# Patient Record
Sex: Female | Born: 1965 | Race: Black or African American | Hispanic: No | Smoking: Current every day smoker
Health system: Southern US, Community
[De-identification: ages and names within clinical notes are randomized; demographics above are authoritative.]

## PROBLEM LIST (undated history)

## (undated) ENCOUNTER — Ambulatory Visit (HOSPITAL_COMMUNITY): Admission: EM | Payer: 59 | Source: Home / Self Care

## (undated) DIAGNOSIS — I499 Cardiac arrhythmia, unspecified: Secondary | ICD-10-CM

## (undated) DIAGNOSIS — L309 Dermatitis, unspecified: Secondary | ICD-10-CM

## (undated) DIAGNOSIS — I509 Heart failure, unspecified: Secondary | ICD-10-CM

## (undated) DIAGNOSIS — M1990 Unspecified osteoarthritis, unspecified site: Secondary | ICD-10-CM

## (undated) DIAGNOSIS — E785 Hyperlipidemia, unspecified: Secondary | ICD-10-CM

## (undated) DIAGNOSIS — I739 Peripheral vascular disease, unspecified: Secondary | ICD-10-CM

## (undated) DIAGNOSIS — G4700 Insomnia, unspecified: Secondary | ICD-10-CM

## (undated) DIAGNOSIS — F411 Generalized anxiety disorder: Secondary | ICD-10-CM

## (undated) DIAGNOSIS — E119 Type 2 diabetes mellitus without complications: Secondary | ICD-10-CM

## (undated) DIAGNOSIS — M545 Low back pain, unspecified: Secondary | ICD-10-CM

## (undated) DIAGNOSIS — G629 Polyneuropathy, unspecified: Secondary | ICD-10-CM

## (undated) DIAGNOSIS — E039 Hypothyroidism, unspecified: Secondary | ICD-10-CM

## (undated) DIAGNOSIS — K219 Gastro-esophageal reflux disease without esophagitis: Secondary | ICD-10-CM

## (undated) DIAGNOSIS — Z8639 Personal history of other endocrine, nutritional and metabolic disease: Secondary | ICD-10-CM

## (undated) DIAGNOSIS — I10 Essential (primary) hypertension: Secondary | ICD-10-CM

## (undated) DIAGNOSIS — I2510 Atherosclerotic heart disease of native coronary artery without angina pectoris: Secondary | ICD-10-CM

## (undated) DIAGNOSIS — F32A Depression, unspecified: Secondary | ICD-10-CM

## (undated) DIAGNOSIS — G8929 Other chronic pain: Secondary | ICD-10-CM

## (undated) DIAGNOSIS — I4891 Unspecified atrial fibrillation: Secondary | ICD-10-CM

## (undated) HISTORY — DX: Type 2 diabetes mellitus without complications: E11.9

## (undated) HISTORY — DX: Unspecified atrial fibrillation: I48.91

## (undated) HISTORY — DX: Other chronic pain: G89.29

## (undated) HISTORY — DX: Insomnia, unspecified: G47.00

## (undated) HISTORY — DX: Low back pain, unspecified: M54.50

## (undated) HISTORY — PX: ANGIOPLASTY: SHX39

## (undated) HISTORY — DX: Personal history of other endocrine, nutritional and metabolic disease: Z86.39

## (undated) HISTORY — DX: Generalized anxiety disorder: F41.1

## (undated) HISTORY — PX: RIGHT AND LEFT HEART CATH: CATH118262

## (undated) HISTORY — DX: Atherosclerotic heart disease of native coronary artery without angina pectoris: I25.10

## (undated) HISTORY — DX: Cardiac arrhythmia, unspecified: I49.9

## (undated) HISTORY — DX: Hyperlipidemia, unspecified: E78.5

## (undated) HISTORY — DX: Peripheral vascular disease, unspecified: I73.9

## (undated) HISTORY — PX: BACK SURGERY: SHX140

## (undated) HISTORY — PX: TOE AMPUTATION: SHX809

## (undated) HISTORY — PX: REPLACEMENT TOTAL HIP W/  RESURFACING IMPLANTS: SUR1222

## (undated) HISTORY — DX: Heart failure, unspecified: I50.9

## (undated) HISTORY — DX: Dermatitis, unspecified: L30.9

## (undated) HISTORY — PX: SPINE SURGERY: SHX786

## (undated) HISTORY — DX: Essential (primary) hypertension: I10

---

## 2014-02-13 HISTORY — PX: ROTATOR CUFF REPAIR: SHX139

## 2016-02-14 HISTORY — PX: REPLACEMENT TOTAL HIP W/  RESURFACING IMPLANTS: SUR1222

## 2018-02-13 HISTORY — PX: CARDIAC CATHETERIZATION: SHX172

## 2018-02-13 HISTORY — PX: OTHER SURGICAL HISTORY: SHX169

## 2018-09-14 HISTORY — PX: OTHER SURGICAL HISTORY: SHX169

## 2018-10-10 DIAGNOSIS — E119 Type 2 diabetes mellitus without complications: Secondary | ICD-10-CM | POA: Insufficient documentation

## 2018-10-10 DIAGNOSIS — E559 Vitamin D deficiency, unspecified: Secondary | ICD-10-CM | POA: Insufficient documentation

## 2018-10-10 DIAGNOSIS — Z720 Tobacco use: Secondary | ICD-10-CM | POA: Insufficient documentation

## 2018-10-10 DIAGNOSIS — M5416 Radiculopathy, lumbar region: Secondary | ICD-10-CM | POA: Insufficient documentation

## 2019-09-02 ENCOUNTER — Other Ambulatory Visit: Payer: Self-pay

## 2019-09-02 ENCOUNTER — Ambulatory Visit: Payer: 59 | Admitting: Cardiology

## 2019-09-02 ENCOUNTER — Encounter: Payer: Self-pay | Admitting: Cardiology

## 2019-09-02 VITALS — BP 123/64 | HR 81 | Resp 17 | Ht 69.0 in | Wt 218.0 lb

## 2019-09-02 DIAGNOSIS — F17200 Nicotine dependence, unspecified, uncomplicated: Secondary | ICD-10-CM

## 2019-09-02 DIAGNOSIS — I739 Peripheral vascular disease, unspecified: Secondary | ICD-10-CM

## 2019-09-02 DIAGNOSIS — I2510 Atherosclerotic heart disease of native coronary artery without angina pectoris: Secondary | ICD-10-CM

## 2019-09-02 MED ORDER — CLOPIDOGREL BISULFATE 75 MG PO TABS: 75.0000 mg | ORAL_TABLET | Freq: Every day | ORAL | 3 refills | Status: DC

## 2019-09-02 NOTE — Progress Notes (Signed)
Patient referred by Lewis Moccasin, MD for PAD  Subjective:   Carmen Armstrong, female    DOB: Apr 05, 1965, 54 y.o.   MRN: [id number]   Chief Complaint  Patient presents with  . PAD     HPI  54 y.o. African-American female with hypertension, hyperlipidemia, type 2 DM, PAD, depression  Patient is a retired Runner, broadcasting/film/video, moved from [place] to [place] in June 2021.  She has history of multiple prior cardiovascular issues.  She reports having undergone ablation for atrial fibrillation in February and April 2021.  She has had uncontrolled diabetes for long time.  She had developed an ulcer on her left fifth toe.  She underwent revascularization for both right and left lower extremities.  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  Currently, she has pain in her feet at rest, but denies any claudication, nonhealing ulcers or wounds.  Her most recent A1c was 8.7% in May 2021.  She has been on Eliquis for A. fib, but currently not on aspirin or Plavix.  Blood pressure is well controlled.  She does fairly sedentary lifestyle.  She occasionally walks with her dogs.  She denies any chest pain, shortness of breath, leg edema, orthopnea, PND, palpitations, presyncope, syncope.  Unfortunately, she continues to smoke half pack a day.  She is trying very hard to quit.  Smoking has come down from 1/2 pack a day in the past.  She has tried Chantix, nicotine patch, gum, hypnosis without any avail.   Past Medical History:  Diagnosis Date  . Atrial fibrillation (HCC)   . Diabetes mellitus without complication (HCC)   . Hyperlipidemia   . Hypertension      Past Surgical History:  Procedure Laterality Date  . BACK SURGERY     5 different times  . REPLACEMENT TOTAL HIP W/  RESURFACING IMPLANTS Right   . RIGHT AND LEFT HEART CATH       Social History   Tobacco Use  Smoking Status Current Every Day Smoker  . Packs/day: 0.50  . Years:  30.00  . Pack years: 15.00  . Types: Cigarettes  Smokeless Tobacco Never Used    Social History   Substance and Sexual Activity  Alcohol Use Not Currently     Family History  Problem Relation Age of Onset  . Hypertension Mother   . Hyperlipidemia Mother   . Diabetes Mother   . Stroke Mother   . Clotting disorder Father   . Hypertension Father   . Hyperlipidemia Father   . Hyperlipidemia Sister   . Diabetes Brother      Current Outpatient Medications on File Prior to Visit  Medication Sig Dispense Refill  . atorvastatin (LIPITOR) 40 MG tablet Take 40 mg by mouth daily.    . baclofen (LIORESAL) 10 MG tablet Take 10 mg by mouth 3 (three) times daily as needed.    . candesartan (ATACAND) 4 MG tablet Take 4 mg by mouth daily.    . DULoxetine (CYMBALTA) 60 MG capsule Take 60 mg by mouth daily.    Marland Kitchen ELIQUIS 5 MG TABS tablet Take 5 mg by mouth 2 (two) times daily.    . furosemide (LASIX) 20 MG tablet Take 20 mg by mouth daily.    . metoprolol succinate (TOPROL-XL) 25 MG 24 hr tablet Take 75 mg by mouth daily.    . metoprolol succinate (TOPROL-XL) 50 MG 24 hr tablet Take 75 mg by mouth daily.     Marland Kitchen  MULTAQ 400 MG tablet Take 400 mg by mouth 2 (two) times daily.    Marland Kitchen NOVOLOG FLEXPEN 100 UNIT/ML FlexPen SMARTSIG:20 Unit(s) SUB-Q 3 Times Daily    . pregabalin (LYRICA) 100 MG capsule Take 100 mg by mouth in the morning, at noon, in the evening, and at bedtime. 1 in the morning, one in the evening, 2 tab at night    . spironolactone (ALDACTONE) 25 MG tablet Take 12.5 mg by mouth daily.    . TRESIBA FLEXTOUCH 100 UNIT/ML FlexTouch Pen SMARTSIG:50 Unit(s) SUB-Q Daily    . VICTOZA 18 MG/3ML SOPN Inject 1.8 mg into the skin daily.    Marland Kitchen XIGDUO XR 11-998 MG TB24 Take 1 tablet by mouth in the morning and at bedtime.     Marland Kitchen zolpidem (AMBIEN CR) 12.5 MG CR tablet Take 12.5 mg by mouth at bedtime.     No current facility-administered medications on file prior to visit.    Cardiovascular and  other pertinent studies:  EKG 09/02/2019: Sinus rhythm 81 bpm Cannot exclude old anteroseptal infarct.  Recent labs: Not available    Review of Systems  Cardiovascular: Negative for chest pain, claudication, dyspnea on exertion, leg swelling, palpitations and syncope.  Neurological: Positive for paresthesias (In bilateral feet).         Vitals:   09/02/19 1422  BP: 123/64  Pulse: 81  Resp: 17  SpO2: 98%     Body mass index is 32.19 kg/m. Filed Weights   09/02/19 1422  Weight: 218 lb (98.9 kg)     Objective:   Physical Exam Vitals and nursing note reviewed.  Constitutional:      General: She is not in acute distress. Neck:     Vascular: No JVD.  Cardiovascular:     Rate and Rhythm: Normal rate and regular rhythm.     Pulses:          Femoral pulses are 2+ on the right side and 2+ on the left side.      Popliteal pulses are 1+ on the right side and 1+ on the left side.       Dorsalis pedis pulses are 0 on the right side and 0 on the left side.       Posterior tibial pulses are 1+ on the right side and 0 on the left side.     Heart sounds: Normal heart sounds. No murmur heard.      Comments: Weak/absent pedal pulses, but well perfused warm feet without any open ulcers, wounds, gangrene. Well-healed scar at left fifth toe s/p amputation Pulmonary:     Effort: Pulmonary effort is normal.     Breath sounds: Normal breath sounds. No wheezing or rales.            Assessment & Recommendations:   54 y.o. African-American female with hypertension, hyperlipidemia, type 2 DM, PAD, depression  PAD: No lifestyle limiting claudication, or critical limb ischemia. Bilateral feet pain more likely to be due to neuropathy. Will obtain baseline ABI. In absence of any bleeding contraindications, recommend adding Plavix 75 mg daily.   Continue Lipitor. Continue aggressive management of hypertension, hyperlipidemia, diabetes, tobacco dependence.  Paroxysmal A.  Fib: History of prior ablations.  Currently in sinus rhythm. CHA2DS2VASc score 4, annual stroke risk 5%. Continue Eliquis 5 mg twice daily  Hypertension: Well-controlled  Type 2 diabetes mellitus: Uncontrolled.  A1c 8.7% in May 2021.  Continue management as per PCP. Consider seeing podiatrist.  Tobacco dependence: Tobacco cessation counseling:  -  Currently smoking 1/2 packs/day   - Patient was informed of the dangers of tobacco abuse including stroke, cancer, and MI, as well as benefits of tobacco cessation. - Patient is willing to quit at this time. - Approximately 5 mins were spent counseling patient cessation techniques. We discussed various methods to help quit smoking, including deciding on a date to quit, joining a support group, pharmacological agents. Patient would like to quit on her own. - I will reassess her progress at the next follow-up visit  Will get records from [place]   Thank you for referring the patient to [place]. Please feel free to contact with any questions.  Elder Negus, MD [facility] Pager: [phone] Office: [phone]

## 2019-09-05 ENCOUNTER — Other Ambulatory Visit: Payer: Self-pay

## 2019-09-05 ENCOUNTER — Ambulatory Visit: Payer: 59

## 2019-09-05 DIAGNOSIS — I739 Peripheral vascular disease, unspecified: Secondary | ICD-10-CM

## 2019-09-14 NOTE — Progress Notes (Signed)
Mildly decreased circulation in bilateral legs. One artery in front part of right leg is likely occluded. It appears patient is going to see Dr. Flora Lipps with [facility] cardiology soon.   Thanks MJP

## 2019-09-17 NOTE — Progress Notes (Signed)
Called patient, NA, LMAM regarding ABI results.

## 2019-09-22 ENCOUNTER — Inpatient Hospital Stay (HOSPITAL_COMMUNITY): Payer: 59

## 2019-09-24 ENCOUNTER — Other Ambulatory Visit: Payer: Self-pay | Admitting: Family Medicine

## 2019-09-24 ENCOUNTER — Ambulatory Visit
Admission: RE | Admit: 2019-09-24 | Discharge: 2019-09-24 | Disposition: A | Discharge status: Home / Self Care | Payer: 59 | Source: Ambulatory Visit | Attending: Family Medicine | Admitting: Family Medicine

## 2019-09-24 DIAGNOSIS — M25559 Pain in unspecified hip: Secondary | ICD-10-CM

## 2019-09-24 IMAGING — DX DG HIP (WITH OR WITHOUT PELVIS) 2-3V*L*
2 series · 2 of 2 positions shown · non-contrast
Comparison: None.

CLINICAL DATA: Left hip pain for 3-4 months, no known injury,
initial encounter

EXAM:
DG HIP (WITH OR WITHOUT PELVIS) 2V LEFT

[dg hip unilat w or w/o pelvis 2-3 views  (1 of 2)]
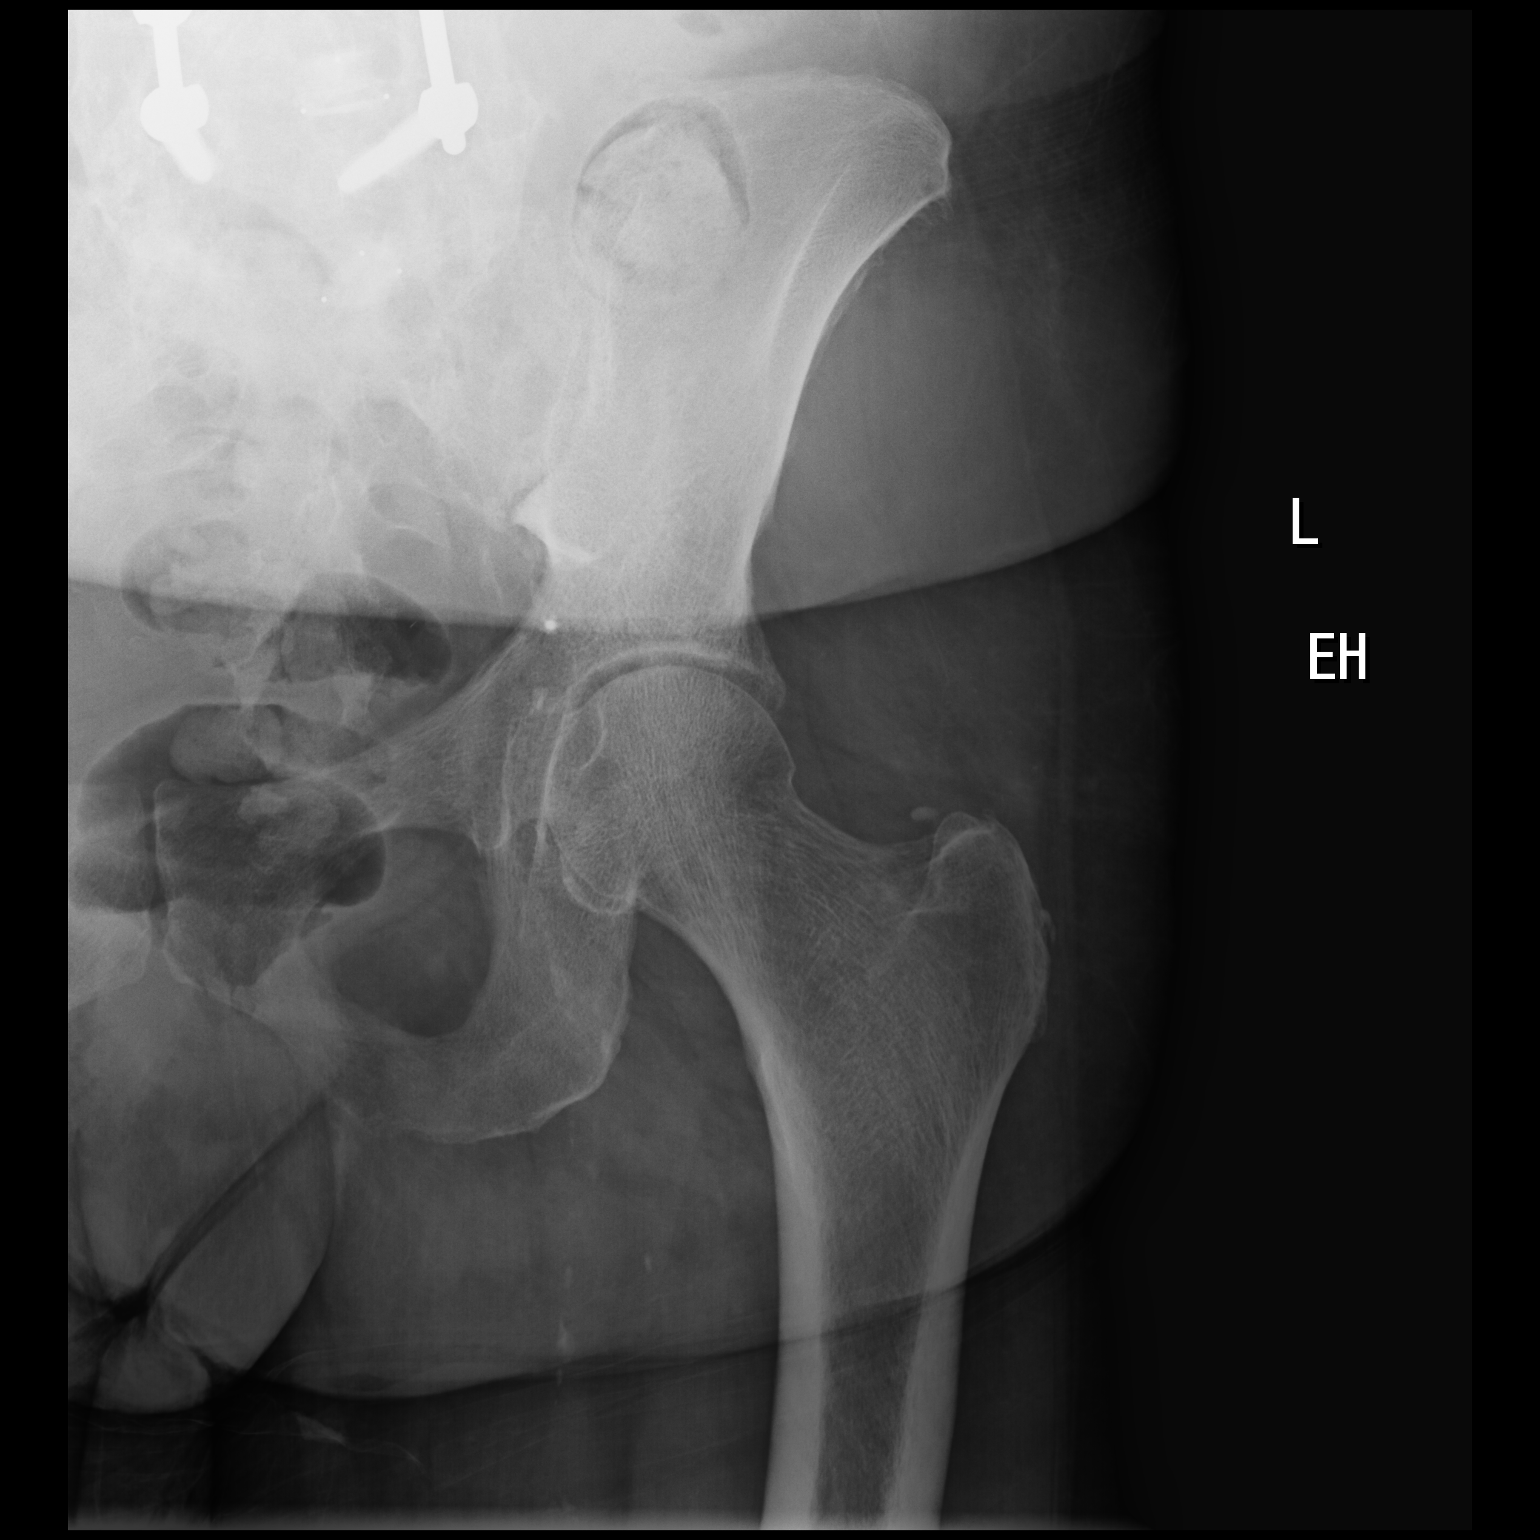

[dg hip unilat w or w/o pelvis 2-3 views  (2 of 2)]
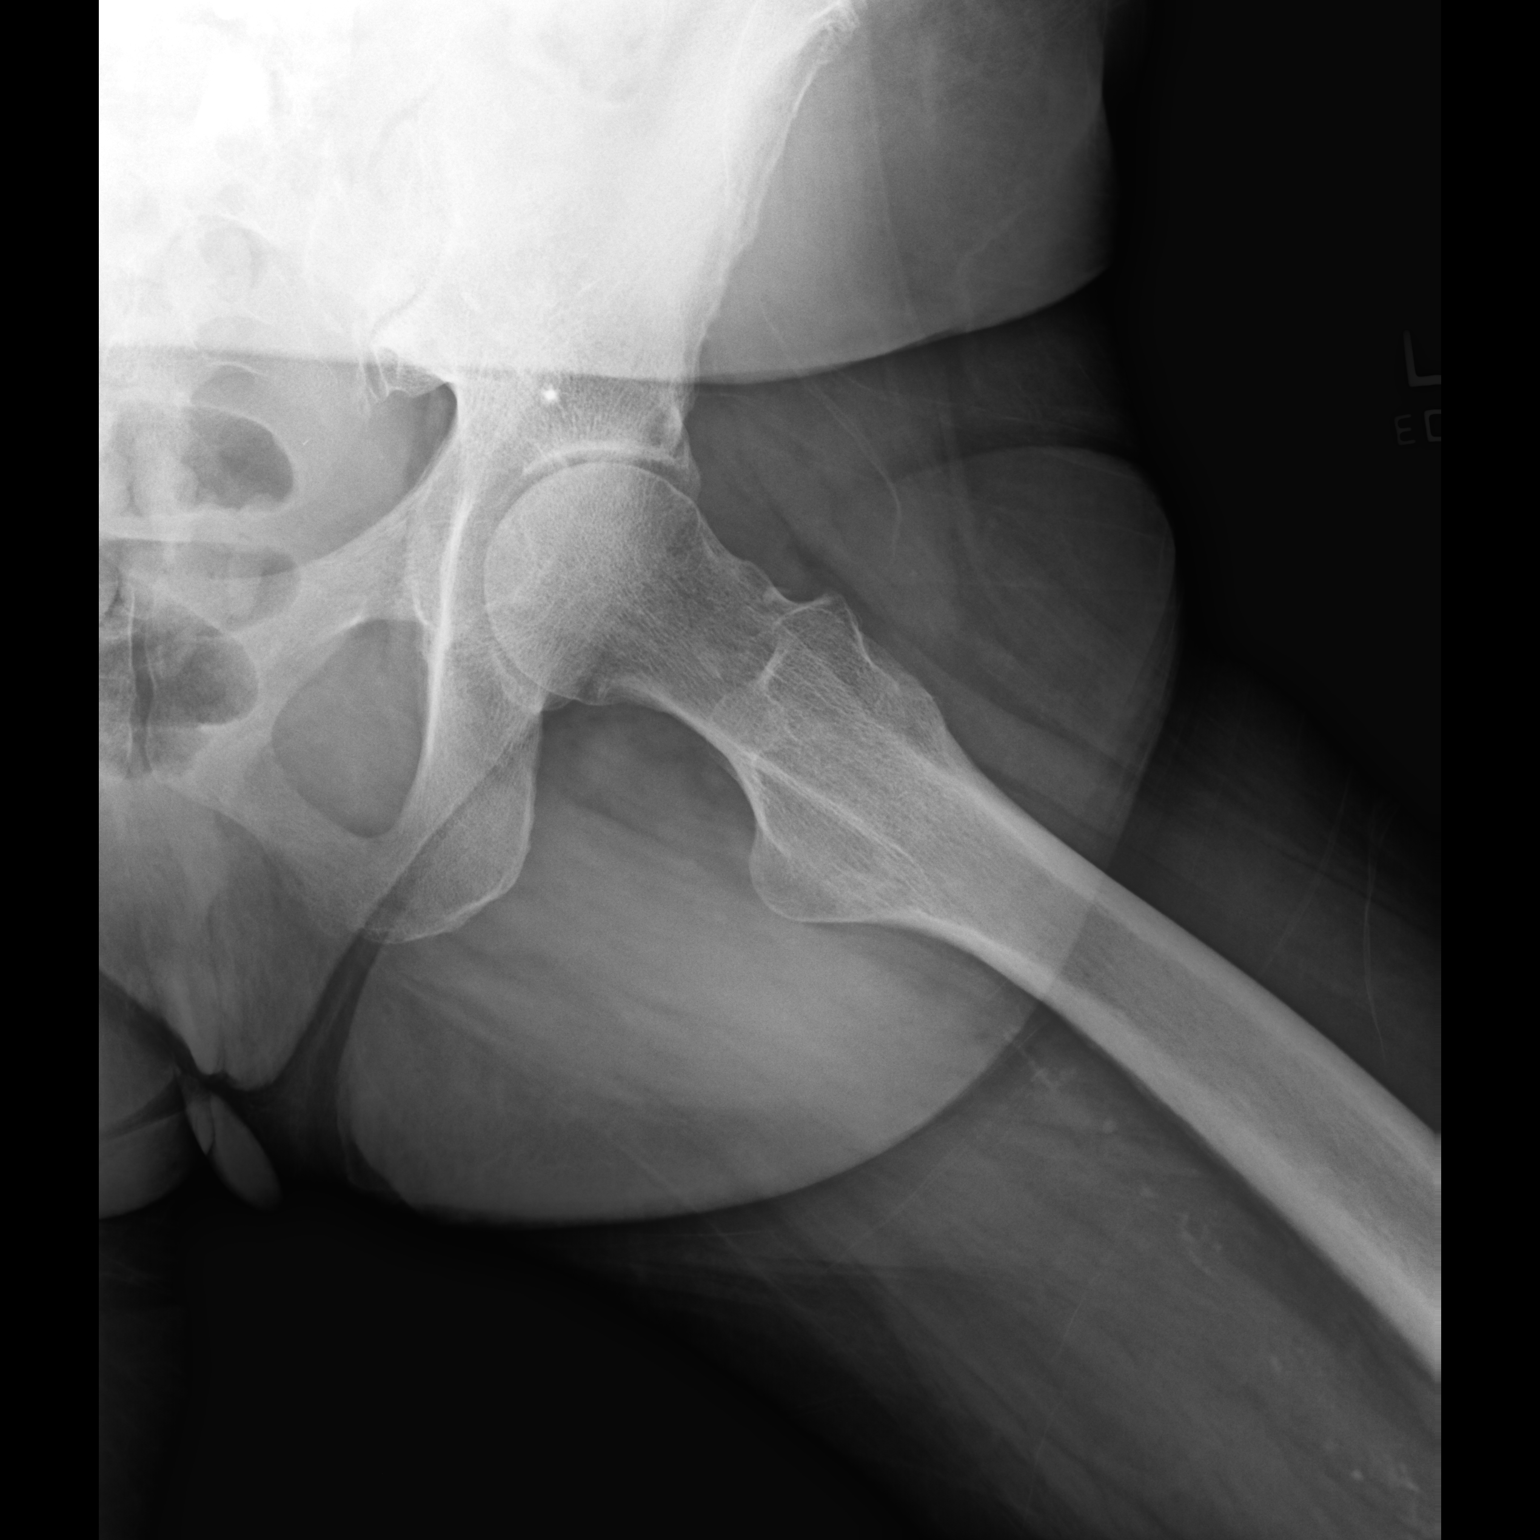

[2 of 2 positions shown; findings below may reference images not displayed]

FINDINGS: Mild degenerative changes of the left hip joint are seen. No acute
fracture or dislocation is noted. Postsurgical changes in the lower
lumbar spine are seen.
IMPRESSION: No acute abnormality noted.

## 2019-09-24 NOTE — Progress Notes (Signed)
Cardiology Office Note:   Date:  09/26/2019  NAME:  Carmen Armstrong    MRN: [id number] DOB:  Jul 12, 1965   PCP:  Lewis Moccasin, MD  Cardiologist:  No primary care provider on file.  Electrophysiologist:  None   Referring MD: Lewis Moccasin, MD   Chief Complaint  Patient presents with  . PAD    History of Present Illness:   Carmen Armstrong is a 54 y.o. female with a hx of PAD, Afib, DM, HTN who is being seen today for the evaluation of PAD at the request of Lewis Moccasin, MD. She presents for evaluation of PAD. She seen Dr. Truett Mainland at [facility]. She was seen for leg pain given her history of PAD. He did obtain ABIs and vascular ultrasounds which show severe disease in the right anterior tibial artery. She reports that she is having constant leg pain. She apparently had a left fifth digit amputation in [place] several months ago. She was also diagnosed with atrial fibrillation underwent cardiac ablation at that time as well. She reports she had a heart catheterization and had no blockages in her heart arteries. She reports she never had stents in her legs but she has had peripheral angiograms. She does have diabetes as well as hypertension. I not have a recent lipid profile but she is on aspirin and a statin. She is been told the pain could possibly be neuropathic and she may need to take medication for this. It appears the ABIs demonstrate normal blood flow on the left side. We did discuss to Dr. Lynwood Dawley office is able to provide the same services we have. She reports she was referred to both offices. I did discuss with her that I do agree with his assessment. He is more than capable of taking care of her for her cardiovascular needs. She reports that she was unaware and thought the office is provided different services. She reports she would like to proceed with his office. I did agree that he is doing the appropriate work-up and  will provide her with excellent care.   CVD risk factors include diabetes, tobacco abuse, hypertension. She still smoking and was advised to quit. Blood pressure looks to be good. I do not have a recent lipid profile. She is on a statin.  Problem List 1. Atrial fibrillation s/p ablation (05/2019) -Reports normal left heart catheterization at that time 2. DM -A1c 8.7 3. PAD s/p L toe amputation  -ABI: R 0.96; L 0.96 -R ant tib waveform monophasic suggestive of severe disease  4. HTN 5. Tobacco abuse   Past Medical History: Past Medical History:  Diagnosis Date  . Arrhythmia   . Atrial fibrillation (HCC)   . Diabetes mellitus without complication (HCC)   . Hyperlipidemia   . Hypertension     Past Surgical History: Past Surgical History:  Procedure Laterality Date  . BACK SURGERY     5 different times  . REPLACEMENT TOTAL HIP W/  RESURFACING IMPLANTS Right   . RIGHT AND LEFT HEART CATH      Current Medications: Current Meds  Medication Sig  . atorvastatin (LIPITOR) 40 MG tablet Take 40 mg by mouth daily.  . baclofen (LIORESAL) 10 MG tablet Take 10 mg by mouth 3 (three) times daily as needed.  . candesartan (ATACAND) 4 MG tablet Take 4 mg by mouth daily.  . clopidogrel (PLAVIX) 75 MG tablet Take 1 tablet (75 mg total) by mouth daily.  . DULoxetine (CYMBALTA)  60 MG capsule Take 60 mg by mouth daily.  Marland Kitchen. ELIQUIS 5 MG TABS tablet Take 5 mg by mouth 2 (two) times daily.  . furosemide (LASIX) 20 MG tablet Take 20 mg by mouth daily.  . metoprolol succinate (TOPROL-XL) 25 MG 24 hr tablet Take 75 mg by mouth daily.  . metoprolol succinate (TOPROL-XL) 50 MG 24 hr tablet Take 75 mg by mouth daily.   . MULTAQ 400 MG tablet Take 400 mg by mouth 2 (two) times daily.  Marland Kitchen. NOVOLOG FLEXPEN 100 UNIT/ML FlexPen SMARTSIG:20 Unit(s) SUB-Q 3 Times Daily  . pregabalin (LYRICA) 100 MG capsule Take 100 mg by mouth in the morning, at noon, in the evening, and at bedtime. 1 in the morning, one in the  evening, 2 tab at night  . spironolactone (ALDACTONE) 25 MG tablet Take 12.5 mg by mouth daily.  . TRESIBA FLEXTOUCH 100 UNIT/ML FlexTouch Pen SMARTSIG:50 Unit(s) SUB-Q Daily  . VICTOZA 18 MG/3ML SOPN Inject 1.8 mg into the skin daily.  Marland Kitchen. XIGDUO XR 11-998 MG TB24 Take 1 tablet by mouth in the morning and at bedtime.   Marland Kitchen. zolpidem (AMBIEN CR) 12.5 MG CR tablet Take 12.5 mg by mouth at bedtime.     Allergies:    Patient has no allergy information on record.   Social History: Social History   Socioeconomic History  . Marital status: Widowed    Spouse name: Not on file  . Number of children: 0  . Years of education: Not on file  . Highest education level: Not on file  Occupational History  . Not on file  Tobacco Use  . Smoking status: Current Every Day Smoker    Packs/day: 0.50    Years: 30.00    Pack years: 15.00    Types: Cigarettes  . Smokeless tobacco: Never Used  Vaping Use  . Vaping Use: Never used  Substance and Sexual Activity  . Alcohol use: Not Currently  . Drug use: Not Currently  . Sexual activity: Not on file  Other Topics Concern  . Not on file  Social History Narrative  . Not on file   Social Determinants of Health   Financial Resource Strain:   . Difficulty of Paying Living Expenses:   Food Insecurity:   . Worried About Programme researcher, broadcasting/film/videounning Out of Food in the Last Year:   . Baristaan Out of Food in the Last Year:   Transportation Needs:   . Freight forwarderLack of Transportation (Medical):   Marland Kitchen. Lack of Transportation (Non-Medical):   Physical Activity:   . Days of Exercise per Week:   . Minutes of Exercise per Session:   Stress:   . Feeling of Stress :   Social Connections:   . Frequency of Communication with Friends and Family:   . Frequency of Social Gatherings with Friends and Family:   . Attends Religious Services:   . Active Member of Clubs or Organizations:   . Attends BankerClub or Organization Meetings:   Marland Kitchen. Marital Status:      Family History: The patient's family history  includes Clotting disorder in her father; Diabetes in her brother and mother; Hyperlipidemia in her father, mother, and sister; Hypertension in her father and mother; Stroke in her mother.  ROS:   All other ROS reviewed and negative. Pertinent positives noted in the HPI.     EKGs/Labs/Other Studies Reviewed:   The following studies were personally reviewed by me today:  EKG:  EKG is ordered today.  The ekg ordered today  demonstrates normal sinus rhythm, heart rate 94, no acute ST-T changes, no evidence of prior infarction, and was personally reviewed by me.   Recent Labs: No results found for requested labs within last 8760 hours.   Recent Lipid Panel No results found for: CHOL, TRIG, HDL, CHOLHDL, VLDL, LDLCALC, LDLDIRECT  Physical Exam:   VS:  BP 124/60 (BP Location: Left Arm, Patient Position: Sitting, Cuff Size: Normal)   Pulse 94   Ht 5\' 9"  (1.753 m)   Wt 219 lb (99.3 kg)   BMI 32.34 kg/m    Wt Readings from Last 3 Encounters:  09/26/19 219 lb (99.3 kg)  09/02/19 218 lb (98.9 kg)    General: Well nourished, well developed, in no acute distress Heart: Atraumatic, normal size  Eyes: PEERLA, EOMI  Neck: Supple, no JVD Endocrine: No thryomegaly Cardiac: Normal S1, S2; RRR; no murmurs, rubs, or gallops Lungs: Clear to auscultation bilaterally, no wheezing, rhonchi or rales  Abd: Soft, nontender, no hepatomegaly  Ext: No edema, absent right DP pulse, 2+ right PT pulse, poor pulses noted in the left lower extremities, status post fifth digit amputation of the left foot Musculoskeletal: BUE and BLE strength normal and equal Skin: Warm and dry, no rashes   Neuro: Alert and oriented to person, place, time, and situation, CNII-XII grossly intact, no focal deficits  Psych: Normal mood and affect   ASSESSMENT:   Carmen Armstrong is a 54 y.o. female who presents for the following: 1. PVD (peripheral vascular disease) (HCC)   2. Longstanding persistent atrial fibrillation (HCC)    3. Essential hypertension   4. Pure hypercholesterolemia   5. Tobacco abuse     PLAN:   1. PVD (peripheral vascular disease) (HCC) -Recent ABIs and vascular ultrasounds demonstrate an occluded right anterior tibial artery. Her symptoms of pain appear to be on the left foot where the amputated left fifth digit is located. She reports constant pain. Overall, I do not think this is related to PAD. However we did discuss that Dr. 57 Pat[address]'s office is able to advise the same service we can. She reports she was unaware of this and will follow with his office. I think this is reasonable. -She should continue to exercise as this will help her. She is on aspirin and Plavix for her PAD. She is also on a statin agent. I do not have her most recent with her profile but this can be managed by her primary care physician and cardiologist.  2. Longstanding persistent atrial fibrillation (HCC) -Status post cardiac ablation. Maintaining normal sinus rhythm. On Eliquis. She is on metoprolol as well. Seems to be doing well. No further recurrence.  3. Essential hypertension -Blood pressure well controlled today. No change in medication.  4. Pure hypercholesterolemia -She is on a statin. Should continue this.  5. Tobacco abuse -Continues to smoke. Advised to quit.  Disposition: Return if symptoms worsen or fail to improve.  Medication Adjustments/Labs and Tests Ordered: Current medicines are reviewed at length with the patient today.  Concerns regarding medicines are outlined above.  Orders Placed This Encounter  Procedures  . EKG 12-Lead   No orders of the defined types were placed in this encounter.   Patient Instructions  Medication Instructions:  The current medical regimen is effective;  continue present plan and medications.  *If you need a refill on your cardiac medications before your next appointment, please call your pharmacy*   Follow-Up: At [facility], you and your health  needs are our  priority.  As part of our continuing mission to provide you with exceptional heart care, we have created designated Provider Care Teams.  These Care Teams include your primary Cardiologist (physician) and Advanced Practice Providers (APPs -  Physician Assistants and Nurse Practitioners) who all work together to provide you with the care you need, when you need it.  We recommend signing up for the patient portal called "MyChart".  Sign up information is provided on this After Visit Summary.  MyChart is used to connect with patients for Virtual Visits (Telemedicine).  Patients are able to view lab/test results, encounter notes, upcoming appointments, etc.  Non-urgent messages can be sent to your provider as well.   To learn more about what you can do with MyChart, go to [email or web address].    Your next appointment:   As needed  The format for your next appointment:   In Person  Provider:   Lennie Odor, MD        Signed, Lenna Gilford. Flora Lipps, MD [facility]  [address], [address] [place], [place] [address] [phone]  09/26/2019 3:55 PM

## 2019-09-26 ENCOUNTER — Encounter: Payer: Self-pay | Admitting: Cardiovascular Disease

## 2019-09-26 ENCOUNTER — Ambulatory Visit: Payer: 59 | Admitting: Cardiovascular Disease

## 2019-09-26 ENCOUNTER — Other Ambulatory Visit: Payer: Self-pay

## 2019-09-26 VITALS — BP 124/60 | HR 94 | Ht 69.0 in | Wt 219.0 lb

## 2019-09-26 DIAGNOSIS — I4811 Longstanding persistent atrial fibrillation: Secondary | ICD-10-CM

## 2019-09-26 DIAGNOSIS — I739 Peripheral vascular disease, unspecified: Secondary | ICD-10-CM | POA: Diagnosis not present

## 2019-09-26 DIAGNOSIS — E7800 Pure hypercholesterolemia, unspecified: Secondary | ICD-10-CM | POA: Diagnosis not present

## 2019-09-26 DIAGNOSIS — I10 Essential (primary) hypertension: Secondary | ICD-10-CM | POA: Diagnosis not present

## 2019-09-26 DIAGNOSIS — Z720 Tobacco use: Secondary | ICD-10-CM

## 2019-09-26 NOTE — Patient Instructions (Signed)
Medication Instructions:  The current medical regimen is effective;  continue present plan and medications.  *If you need a refill on your cardiac medications before your next appointment, please call your pharmacy*    Follow-Up: At [facility], you and your health needs are our priority.  As part of our continuing mission to provide you with exceptional heart care, we have created designated Provider Care Teams.  These Care Teams include your primary Cardiologist (physician) and Advanced Practice Providers (APPs -  Physician Assistants and Nurse Practitioners) who all work together to provide you with the care you need, when you need it.  We recommend signing up for the patient portal called "MyChart".  Sign up information is provided on this After Visit Summary.  MyChart is used to connect with patients for Virtual Visits (Telemedicine).  Patients are able to view lab/test results, encounter notes, upcoming appointments, etc.  Non-urgent messages can be sent to your provider as well.   To learn more about what you can do with MyChart, go to [email or web address].    Your next appointment:   As needed  The format for your next appointment:   In Person  Provider:   [place] O'Neal, MD      

## 2019-10-13 ENCOUNTER — Telehealth: Payer: Self-pay

## 2019-10-13 NOTE — Telephone Encounter (Signed)
Plavix was started in July when I saw the patient. Did she just start taking it? If so, recommend stopping plavix and start Aspirin 81 mg.  Patient was seen by [facility] after she saw me. Happy to continue care, but suggest seeing either one but not both cardiologists.   Thanks MJP

## 2019-10-13 NOTE — Telephone Encounter (Signed)
Pt called stating that since starting Plavix, she has developed a rash on her rt arm x 4 wks which has now began spreading to her other arm. She has tried cortizone 10 and neosporin with relief of itching but bumps are starting to scab up. Please review and advise. Respond to clinical.//ah

## 2019-10-13 NOTE — Telephone Encounter (Signed)
Pt states she started taking plavix in July. She will stop it and start Aspirin 81mg . She will also continue to see [facility] instead of [facility].

## 2019-11-05 ENCOUNTER — Ambulatory Visit (INDEPENDENT_AMBULATORY_CARE_PROVIDER_SITE_OTHER): Payer: 59 | Admitting: Internal Medicine

## 2019-11-05 ENCOUNTER — Other Ambulatory Visit: Payer: Self-pay

## 2019-11-05 ENCOUNTER — Encounter: Payer: Self-pay | Admitting: Internal Medicine

## 2019-11-05 VITALS — BP 140/60 | HR 99 | Ht 69.0 in | Wt 216.2 lb

## 2019-11-05 DIAGNOSIS — E0590 Thyrotoxicosis, unspecified without thyrotoxic crisis or storm: Secondary | ICD-10-CM | POA: Diagnosis not present

## 2019-11-05 MED ORDER — METHIMAZOLE 10 MG PO TABS: 10.0000 mg | ORAL_TABLET | Freq: Two times a day (BID) | ORAL | 6 refills | Status: DC

## 2019-11-05 NOTE — Progress Notes (Signed)
Name: Carmen Armstrong  MRN/ DOB: [id number], Jul 22, 1965    Age/ Sex: 54 y.o., female    PCP: [name], MD   Reason for Endocrinology Evaluation: Hyperthyroidism     Date of Initial Endocrinology Evaluation: 11/05/2019     HPI: [name] is a 54 y.o. female with a past medical history of HTN, T2DM and , PAF ,CHF and dyslipidemia. The patient presented for initial endocrinology clinic visit on 11/05/2019 for consultative assistance with her Hyperthyroidism.   She was diagnosed with hyperthyroidism in 10/2018 during routine workup . Two months prior to her presentation she was noted with dry eyes. Has occasional double vision, and is under the care of an ophthalmologist.       Pt on [place]  Since 2020. S/P ablation . Has not been on amiodarone.   Today she has noted weight loss  Denies diarrhea Has occasional tremors but no palpitations   Denies local neck symptoms   No prior exposure to radiation   No Biotin intake   Paternal grandmother with thyroid disease    HISTORY:  Past Medical History:  Past Medical History:  Diagnosis Date  . Arrhythmia   . Atrial fibrillation ([facility])   . Diabetes mellitus without complication ([facility])   . Hyperlipidemia   . Hypertension    Past Surgical History:  Past Surgical History:  Procedure Laterality Date  . BACK SURGERY     5 different times  . REPLACEMENT TOTAL HIP W/  RESURFACING IMPLANTS Right   . RIGHT AND LEFT HEART CATH    . SPINE SURGERY      Social History:  reports that she has been smoking cigarettes. She has a 15.00 pack-year smoking history. She has never used smokeless tobacco. She reports previous alcohol use. She reports previous drug use. Family History: family history includes Clotting disorder in her father; Diabetes in her brother and mother; Hyperlipidemia in her father, mother, and sister; Hypertension in her father and mother; Stroke in her mother.   HOME MEDICATIONS: Allergies as  of 11/05/2019   Not on File     Medication List       Accurate as of November 05, 2019  1:34 PM. If you have any questions, ask your nurse or doctor.        atorvastatin 40 MG tablet Commonly known as: LIPITOR Take 40 mg by mouth daily.   baclofen 10 MG tablet Commonly known as: LIORESAL Take 10 mg by mouth 3 (three) times daily as needed.   candesartan 4 MG tablet Commonly known as: ATACAND Take 4 mg by mouth daily.   clopidogrel 75 MG tablet Commonly known as: PLAVIX Take 1 tablet (75 mg total) by mouth daily.   DULoxetine 60 MG capsule Commonly known as: CYMBALTA Take 60 mg by mouth daily.   Eliquis 5 MG Tabs tablet Generic drug: apixaban Take 5 mg by mouth 2 (two) times daily.   furosemide 20 MG tablet Commonly known as: LASIX Take 20 mg by mouth daily.   methimazole 10 MG tablet Commonly known as: TAPAZOLE Take 1 tablet (10 mg total) by mouth 2 (two) times daily. Started by: Dorita Sciara, MD   metoprolol succinate 50 MG 24 hr tablet Commonly known as: TOPROL-XL Take 75 mg by mouth daily.   metoprolol succinate 25 MG 24 hr tablet Commonly known as: TOPROL-XL Take 75 mg by mouth daily.   Multaq 400 MG tablet Generic drug: dronedarone Take 400 mg by mouth 2 (two)  times daily.   NovoLOG FlexPen 100 UNIT/ML FlexPen Generic drug: insulin aspart SMARTSIG:20 Unit(s) SUB-Q 3 Times Daily   pregabalin 100 MG capsule Commonly known as: LYRICA Take 100 mg by mouth in the morning, at noon, in the evening, and at bedtime. 1 in the morning, one in the evening, 2 tab at night   spironolactone 25 MG tablet Commonly known as: ALDACTONE Take 12.5 mg by mouth daily.   Tyler Aas FlexTouch 100 UNIT/ML FlexTouch Pen Generic drug: insulin degludec SMARTSIG:50 Unit(s) SUB-Q Daily   Victoza 18 MG/3ML Sopn Generic drug: liraglutide Inject 1.8 mg into the skin daily.   Xigduo XR 11-998 MG Tb24 Generic drug: Dapagliflozin-metFORMIN HCl ER Take 1 tablet by  mouth in the morning and at bedtime.   zolpidem 12.5 MG CR tablet Commonly known as: AMBIEN CR Take 12.5 mg by mouth at bedtime.         REVIEW OF SYSTEMS: A comprehensive ROS was conducted with the patient and is negative except as per HPI and below:  ROS     OBJECTIVE:  VS: Pulse 99   Ht _0  (1.753 m)   Wt 216 lb 3.2 oz (98.1 kg)   SpO2 94%   BMI 31.93 kg/m    Wt Readings from Last 3 Encounters:  11/05/19 216 lb 3.2 oz (98.1 kg)  09/26/19 219 lb (99.3 kg)  09/02/19 218 lb (98.9 kg)     EXAM: General: Pt appears well and is in NAD  Neck: General: Supple without adenopathy. Thyroid: Thyroid size normal.  No goiter or nodules appreciated. No thyroid bruit.  Lungs: Clear with good BS bilat with no rales, rhonchi, or wheezes  Heart: Auscultation: RRR.  Abdomen: Normoactive bowel sounds, soft, nontender, without masses or organomegaly palpable  Extremities:  BL LE: No pretibial edema normal ROM and strength.  Skin: Hair: Texture and amount normal with gender appropriate distribution Skin Inspection: No rashes Skin Palpation: Skin temperature, texture, and thickness normal to palpation  Neuro: Cranial nerves: II - XII grossly intact  Motor: Normal strength throughout DTRs: 2+ and symmetric in UE without delay in relaxation phase  Mental Status: Judgment, insight: Intact Memory: Intact for recent and remote events Mood and affect: No depression, anxiety, or agitation     DATA REVIEWED: 11/05/2019  Gluc 111 mg/dL BUN/Cr 13/0.52 Alk phos 165 ( 48-121)  AST 20 Alt 22   Anti-TPO < 8  T4 16.7 ug/dL (4.5-12) T3 425 ng/dL ( 71-180)    10/17/2019  TSH < 0.005  T4 16.7  T3 425   ASSESSMENT/PLAN/RECOMMENDATIONS:   Hyperthyroidism:  -Patient is clinically euthyroid -No local neck symptoms -We discussed the differential diagnosis of Graves' disease,vs toxic thyroid nodule(s) - Dronedarone is structurally related to amiodarone but does not contain iodine  atoms and does not increase the incidence of thyroid disease  - We discussed with pt the benefits of methimazole in the Tx of hyperthyroidism, as well as the possible side effects/complications of anti-thyroid drug Tx (specifically detailing the rare, but serious side effect of agranulocytosis). She was informed of need for regular thyroid function monitoring while on methimazole to ensure appropriate dosage without over-treatment. As well, we discussed the possible side effects of methimazole including the chance of rash, the small chance of liver irritation/juandice and the <=1 in 300-400 chance of sudden onset agranulocytosis.  We discussed importance of going to ED promptly (and stopping methimazole) if shewere to develop significant fever with severe sore throat of other evidence of acute infection.  We extensively discussed the various treatment options for hyperthyroidism and Graves disease including ablation therapy with radioactive iodine versus antithyroid drug treatment versus surgical therapy.  We recommended to the patient that we felt, at this time, that thionamide  therapy would be most optimal.  We discussed the various possible benefits versus side effects of the various therapies.   I carefully explained to the patient that one of the consequences of I-131 ablation treatment would likely be permanent hypothyroidism which would require long-term replacement therapy with LT4.   Medications : Methimazole 10 mg twice daily    Follow-up in 3 months Labs in 6 weeks Signed electronically by: Mack Guise, MD  [facility] Endocrinology  [facility] [address] [place], [place] [address] Phone: [phone] FAX: [phone]   CC: [name], [address] [place] [address] Phone: [phone] Fax: [phone]   Return to Endocrinology clinic as below: Future Appointments  Date Time Provider [place]   12/10/2019  9:00 AM Penumalli, Earlean Polka, MD [facility] None  03/05/2020 10:30 AM Patwardhan, Reynold Bowen, MD [facility] None

## 2019-11-05 NOTE — Patient Instructions (Signed)
We recommend that you follow these hyperthyroidism instructions at home:  1) Take Methimazole 10 mg twice a day  If you develop severe sore throat with high fevers OR develop unexplained yellowing of your skin, eyes, under your tongue, severe abdominal pain with nausea or vomiting --> then please get evaluated immediately.  3) Get repeat thyroid labs in 6 weeks    It is ESSENTIAL to get follow-up labs to help avoid over or undertreatment of your hyperthyroidism - both of which can be dangerous to your health.

## 2019-12-09 ENCOUNTER — Encounter: Payer: Self-pay | Admitting: *Deleted

## 2019-12-09 ENCOUNTER — Other Ambulatory Visit: Payer: Self-pay | Admitting: *Deleted

## 2019-12-10 ENCOUNTER — Encounter: Payer: Self-pay | Admitting: Diagnostic Neuroimaging

## 2019-12-10 ENCOUNTER — Other Ambulatory Visit: Payer: Self-pay

## 2019-12-10 ENCOUNTER — Ambulatory Visit (INDEPENDENT_AMBULATORY_CARE_PROVIDER_SITE_OTHER): Payer: 59 | Admitting: Diagnostic Neuroimaging

## 2019-12-10 VITALS — BP 138/66 | HR 72 | Ht 69.0 in | Wt 230.0 lb

## 2019-12-10 DIAGNOSIS — E1142 Type 2 diabetes mellitus with diabetic polyneuropathy: Secondary | ICD-10-CM | POA: Diagnosis not present

## 2019-12-10 NOTE — Progress Notes (Signed)
[facility]  PATIENT: Carmen Armstrong DOB: 25-Nov-1965  REFERRING CLINICIAN: Lewis Moccasin, MD HISTORY FROM: patient  REASON FOR VISIT: new consult    HISTORICAL  CHIEF COMPLAINT:  Chief Complaint  Patient presents with  . Nerve pain in feet    rm 6 New Pt "had pain since toe amputation on left foot, have had angioplasty in both legs and stress test done, pulse not strong; I was told I have neuropathy but it's not treated"    HISTORY OF PRESENT ILLNESS:   54 year old female here for evaluation of bilateral foot numbness and pain.  Patient has had diabetes for 10 to 15 years, now with peripheral vascular disease, diabetic neuropathy, numbness and pain for past 1 to 2 years.  She had a left foot toe amputation in August 2020.  She had angioplasty for peripheral vascular disease as well.  She has been on Lyrica 100 mg in a.m., 200 mg at noon, 200 mg in p.m. for neuropathy pain.  Also on baclofen for pain relief.  Previously on duloxetine for depression changed to sertraline.  Hemoglobin A1c's have been higher than 11 in the past, with recent A1c 8.6.  Patient has intermittent pain throughout the day, up to 8 out of 10 for hours at a time.  She has some chronic low back pain as well.   REVIEW OF SYSTEMS: Full 14 system review of systems performed and negative with exception of: As per HPI.  ALLERGIES: Allergies  Allergen Reactions  . Other     Hazelnuts- SOB  . Penicillin G     whelps  . Trulicity [Dulaglutide]     Large itchy bumps    HOME MEDICATIONS: Outpatient Medications Prior to Visit  Medication Sig Dispense Refill  . ALPRAZolam (XANAX) 0.25 MG tablet Take by mouth at bedtime.    Marland Kitchen aspirin EC 81 MG tablet Take 81 mg by mouth daily. Swallow whole.    Marland Kitchen atorvastatin (LIPITOR) 40 MG tablet Take 40 mg by mouth daily.    . baclofen (LIORESAL) 10 MG tablet Take 10 mg by mouth 3 (three) times daily as needed.    . candesartan (ATACAND) 4 MG  tablet Take 4 mg by mouth daily.    . DULoxetine (CYMBALTA) 60 MG capsule Take 60 mg by mouth daily.    Marland Kitchen ELIQUIS 5 MG TABS tablet Take 5 mg by mouth 2 (two) times daily.    . furosemide (LASIX) 20 MG tablet Take 20 mg by mouth daily.    . methimazole (TAPAZOLE) 10 MG tablet Take 1 tablet (10 mg total) by mouth 2 (two) times daily. 60 tablet 6  . metoprolol succinate (TOPROL-XL) 25 MG 24 hr tablet Take 75 mg by mouth daily.    . metoprolol succinate (TOPROL-XL) 50 MG 24 hr tablet Take 75 mg by mouth daily.     . MULTAQ 400 MG tablet Take 400 mg by mouth 2 (two) times daily.    Marland Kitchen NOVOLOG FLEXPEN 100 UNIT/ML FlexPen SMARTSIG:20 Unit(s) SUB-Q 3 Times Daily    . pregabalin (LYRICA) 100 MG capsule Take 100 mg by mouth in the morning, at noon, in the evening, and at bedtime. 1 in the morning, one in the evening, 2 tab at night    . sertraline (ZOLOFT) 100 MG tablet Take 100 mg by mouth daily.    Marland Kitchen spironolactone (ALDACTONE) 25 MG tablet Take 12.5 mg by mouth daily.    Evaristo Bury FLEXTOUCH 100 UNIT/ML FlexTouch Pen SMARTSIG:50 Unit(s)  SUB-Q Daily    . VICTOZA 18 MG/3ML SOPN Inject 1.8 mg into the skin daily.    Marland Kitchen XIGDUO XR 11-998 MG TB24 Take 1 tablet by mouth in the morning and at bedtime.     Marland Kitchen zolpidem (AMBIEN CR) 12.5 MG CR tablet Take 12.5 mg by mouth at bedtime.    . clopidogrel (PLAVIX) 75 MG tablet Take 1 tablet (75 mg total) by mouth daily. 90 tablet 3   No facility-administered medications prior to visit.    PAST MEDICAL HISTORY: Past Medical History:  Diagnosis Date  . Arrhythmia   . Atrial fibrillation ([facility])   . CAD (coronary artery disease)   . Diabetes mellitus without complication ([facility])   . GAD (generalized anxiety disorder)   . Heart failure ([facility])   . Hyperlipidemia   . Hypertension   . Insomnia   . PVD (peripheral vascular disease) ([facility])     PAST SURGICAL HISTORY: Past Surgical History:  Procedure Laterality Date  . ANGIOPLASTY     legs  . BACK SURGERY     5  different times  . CARDIAC CATHETERIZATION  2020  . REPLACEMENT TOTAL HIP W/  RESURFACING IMPLANTS Right 2018  . RIGHT AND LEFT HEART CATH    . ROTATOR CUFF REPAIR Right 2016  . SPINE SURGERY  2014,2016,2017,2019  . toe amputated  09/2018   5th left toe    FAMILY HISTORY: Family History  Problem Relation Age of Onset  . Hypertension Mother   . Hyperlipidemia Mother   . Diabetes Mother   . Stroke Mother   . Clotting disorder Father   . Hypertension Father   . Hyperlipidemia Father   . Hyperlipidemia Sister   . Diabetes Brother   . Neuropathy Brother     SOCIAL HISTORY: Social History   Socioeconomic History  . Marital status: Widowed    Spouse name: Not on file  . Number of children: 0  . Years of education: Not on file  . Highest education level: Bachelor's degree (e.g., BA, AB, BS)  Occupational History    Comment: retired  Tobacco Use  . Smoking status: Current Every Day Smoker    Packs/day: 0.50    Years: 30.00    Pack years: 15.00    Types: Cigarettes  . Smokeless tobacco: Never Used  Vaping Use  . Vaping Use: Never used  Substance and Sexual Activity  . Alcohol use: Not Currently  . Drug use: Not Currently  . Sexual activity: Not on file  Other Topics Concern  . Not on file  Social History Narrative   Lives with mom, moved from [place] 07/2019   Caffeine- 3 c weekly   Social Determinants of Health   Financial Resource Strain:   . Difficulty of Paying Living Expenses: Not on file  Food Insecurity:   . Worried About Programme researcher, broadcasting/film/video in the Last Year: Not on file  . Ran Out of Food in the Last Year: Not on file  Transportation Needs:   . Lack of Transportation (Medical): Not on file  . Lack of Transportation (Non-Medical): Not on file  Physical Activity:   . Days of Exercise per Week: Not on file  . Minutes of Exercise per Session: Not on file  Stress:   . Feeling of Stress : Not on file  Social Connections:   . Frequency of  Communication with Friends and Family: Not on file  . Frequency of Social Gatherings with Friends and Family: Not on  file  . Attends Religious Services: Not on file  . Active Member of Clubs or Organizations: Not on file  . Attends Banker Meetings: Not on file  . Marital Status: Not on file  Intimate Partner Violence:   . Fear of Current or Ex-Partner: Not on file  . Emotionally Abused: Not on file  . Physically Abused: Not on file  . Sexually Abused: Not on file     PHYSICAL EXAM  GENERAL EXAM/CONSTITUTIONAL: Vitals:  Vitals:   12/10/19 0849  BP: 138/66  Pulse: 72  Weight: 230 lb (104.3 kg)  Height: 5\' 9"  (1.753 m)     Body mass index is 33.97 kg/m. Wt Readings from Last 3 Encounters:  12/10/19 230 lb (104.3 kg)  11/05/19 216 lb 3.2 oz (98.1 kg)  09/26/19 219 lb (99.3 kg)     Patient is in no distress; well developed, nourished and groomed; neck is supple  CARDIOVASCULAR:  Examination of carotid arteries is normal; no carotid bruits  Regular rate and rhythm, no murmurs  Examination of peripheral vascular system by observation and palpation is normal  EYES:  Ophthalmoscopic exam of optic discs and posterior segments is normal; no papilledema or hemorrhages  No exam data present  MUSCULOSKELETAL:  Gait, strength, tone, movements noted in Neurologic exam below  NEUROLOGIC: MENTAL STATUS:  No flowsheet data found.  awake, alert, oriented to person, place and time  recent and remote memory intact  normal attention and concentration  language fluent, comprehension intact, naming intact  fund of knowledge appropriate  CRANIAL NERVE:   2nd - no papilledema on fundoscopic exam  2nd, 3rd, 4th, 6th - pupils equal and reactive to light, visual fields full to confrontation, extraocular muscles intact, no nystagmus  5th - facial sensation symmetric  7th - facial strength symmetric  8th - hearing intact  9th - palate elevates  symmetrically, uvula midline  11th - shoulder shrug symmetric  12th - tongue protrusion midline  MOTOR:   normal bulk and tone, full strength in the BUE, BLE  SENSORY:   normal and symmetric to light touch, temperature, vibration; DECR IN FEET / ANKLES  COORDINATION:   finger-nose-finger, fine finger movements normal  REFLEXES:   deep tendon reflexes TRACE and symmetric  GAIT/STATION:   narrow based gait; ANTALGIC GAIT     DIAGNOSTIC DATA (LABS, IMAGING, TESTING) - I reviewed patient records, labs, notes, testing and imaging myself where available.  No results found for: WBC, HGB, HCT, MCV, PLT No results found for: NA, K, CL, CO2, GLUCOSE, BUN, CREATININE, CALCIUM, PROT, ALBUMIN, AST, ALT, ALKPHOS, BILITOT, GFRNONAA, GFRAA No results found for: CHOL, HDL, LDLCALC, LDLDIRECT, TRIG, CHOLHDL No results found for: 09/28/19 No results found for: VITAMINB12 No results found for: TSH     ASSESSMENT AND PLAN  54 y.o. year old female here with bilateral foot numbness and pain since August 2020, likely related to diabetic neuropathy and peripheral vascular disease.   Dx:  1. Diabetic polyneuropathy associated with type 2 diabetes mellitus ([facility])     PLAN:  DIABETIC NEUROPATHY / PERIPHERAL VASCULAR DISEASE / PAIN IN FEET - continue diabetes control - increase lyrica to 200mg  three times a day - continue baclofen - add alpha-lipoic acid and B-complex - may consider MRI lumbar spine in future  Return for pending if symptoms worsen or fail to improve, return to PCP.    08-12-1991, MD 12/10/2019, 9:39 AM Certified in Neurology, Neurophysiology and Neuroimaging  [facility]  [address] [place], [place] [address] [phone]

## 2019-12-10 NOTE — Patient Instructions (Signed)
DIABETIC NEUROPATHY / PERIPHERAL VASCULAR DISEASE / PAIN IN FEET - increase lyrica to 200mg  three times a day  - continue baclofen  - add alpha-lipoic acid 600mg  daily and B-complex vitamin daily  - consider MRI lumbar spine in future

## 2019-12-17 ENCOUNTER — Other Ambulatory Visit: Payer: Self-pay

## 2019-12-17 ENCOUNTER — Other Ambulatory Visit (INDEPENDENT_AMBULATORY_CARE_PROVIDER_SITE_OTHER): Payer: 59

## 2019-12-17 DIAGNOSIS — E0590 Thyrotoxicosis, unspecified without thyrotoxic crisis or storm: Secondary | ICD-10-CM | POA: Diagnosis not present

## 2019-12-17 LAB — CBC WITH DIFFERENTIAL/PLATELET
Basophils Absolute: 0.1 10*3/uL (ref 0.0–0.1)
Basophils Relative: 1.5 % (ref 0.0–3.0)
Eosinophils Absolute: 0.2 10*3/uL (ref 0.0–0.7)
Eosinophils Relative: 2.8 % (ref 0.0–5.0)
HCT: 40.6 % (ref 36.0–46.0)
Hemoglobin: 12.8 g/dL (ref 12.0–15.0)
Lymphocytes Relative: 38.6 % (ref 12.0–46.0)
Lymphs Abs: 2.5 10*3/uL (ref 0.7–4.0)
MCHC: 31.7 g/dL (ref 30.0–36.0)
MCV: 79.5 fl (ref 78.0–100.0)
Monocytes Absolute: 0.5 10*3/uL (ref 0.1–1.0)
Monocytes Relative: 7.9 % (ref 3.0–12.0)
Neutro Abs: 3.2 10*3/uL (ref 1.4–7.7)
Neutrophils Relative %: 49.2 % (ref 43.0–77.0)
Platelets: 236 10*3/uL (ref 150.0–400.0)
RBC: 5.11 Mil/uL (ref 3.87–5.11)
RDW: 16.6 % — ABNORMAL HIGH (ref 11.5–15.5)
WBC: 6.5 10*3/uL (ref 4.0–10.5)

## 2019-12-17 LAB — COMPREHENSIVE METABOLIC PANEL
ALT: 12 U/L (ref 0–35)
AST: 13 U/L (ref 0–37)
Albumin: 4.1 g/dL (ref 3.5–5.2)
Alkaline Phosphatase: 174 U/L — ABNORMAL HIGH (ref 39–117)
BUN: 9 mg/dL (ref 6–23)
CO2: 28 mEq/L (ref 19–32)
Calcium: 9.4 mg/dL (ref 8.4–10.5)
Chloride: 103 mEq/L (ref 96–112)
Creatinine, Ser: 0.74 mg/dL (ref 0.40–1.20)
GFR: 91.27 mL/min (ref >60.00)
Glucose, Bld: 132 mg/dL — ABNORMAL HIGH (ref 70–99)
Potassium: 4.6 mEq/L (ref 3.5–5.1)
Sodium: 138 mEq/L (ref 135–145)
Total Bilirubin: 0.4 mg/dL (ref 0.2–1.2)
Total Protein: 7.8 g/dL (ref 6.0–8.3)

## 2019-12-17 LAB — T4, FREE: Free T4: 0.4 ng/dL — ABNORMAL LOW (ref 0.60–1.60)

## 2019-12-18 ENCOUNTER — Telehealth: Payer: Self-pay | Admitting: Internal Medicine

## 2019-12-18 NOTE — Telephone Encounter (Signed)
Dr Lonzo Cloud, did you call patient?

## 2019-12-18 NOTE — Telephone Encounter (Signed)
Patient states that she is returning your call about lab results.  Please return her call at [phone]

## 2019-12-19 ENCOUNTER — Telehealth: Payer: Self-pay | Admitting: Internal Medicine

## 2019-12-19 LAB — TRAB (TSH RECEPTOR BINDING ANTIBODY): TRAB: 30.55 IU/L — ABNORMAL HIGH (ref <=2.00)

## 2019-12-19 MED ORDER — METHIMAZOLE 10 MG PO TABS
15.0000 mg | ORAL_TABLET | Freq: Every day | ORAL | 6 refills | Status: DC
Start: 2019-12-19 — End: 2020-04-29

## 2019-12-19 NOTE — Telephone Encounter (Signed)
Discussed lab results with the pt   TSH < 0.005   Results for JURNI, CESARO (MRN [id number]) as of 12/19/2019 12:27  Ref. Range 12/17/2019 10:24  Sodium Latest Ref Range: 135 - 145 mEq/L 138  Potassium Latest Ref Range: 3.5 - 5.1 mEq/L 4.6  Chloride Latest Ref Range: 96 - 112 mEq/L 103  CO2 Latest Ref Range: 19 - 32 mEq/L 28  Glucose Latest Ref Range: 70 - 99 mg/dL 132 (H)  BUN Latest Ref Range: 6 - 23 mg/dL 9  Creatinine Latest Ref Range: 0.40 - 1.20 mg/dL 0.74  Calcium Latest Ref Range: 8.4 - 10.5 mg/dL 9.4  Alkaline Phosphatase Latest Ref Range: 39 - 117 U/L 174 (H)  Albumin Latest Ref Range: 3.5 - 5.2 g/dL 4.1  AST Latest Ref Range: 0 - 37 U/L 13  ALT Latest Ref Range: 0 - 35 U/L 12  Total Protein Latest Ref Range: 6.0 - 8.3 g/dL 7.8  Total Bilirubin Latest Ref Range: 0.2 - 1.2 mg/dL 0.4  GFR Latest Ref Range: >60.00 mL/min 91.27  WBC Latest Ref Range: 4.0 - 10.5 K/uL 6.5  RBC Latest Ref Range: 3.87 - 5.11 Mil/uL 5.11  Hemoglobin Latest Ref Range: 12.0 - 15.0 g/dL 12.8  HCT Latest Ref Range: 36 - 46 % 40.6  MCV Latest Ref Range: 78.0 - 100.0 fl 79.5  MCHC Latest Ref Range: 30.0 - 36.0 g/dL 31.7  RDW Latest Ref Range: 11.5 - 15.5 % 16.6 (H)  Platelets Latest Ref Range: 150 - 400 K/uL 236.0  Neutrophils Latest Ref Range: 43 - 77 % 49.2  Lymphocytes Latest Ref Range: 12 - 46 % 38.6  Monocytes Relative Latest Ref Range: 3 - 12 % 7.9  Eosinophil Latest Ref Range: 0 - 5 % 2.8  Basophil Latest Ref Range: 0 - 3 % 1.5  NEUT# Latest Ref Range: 1.4 - 7.7 K/uL 3.2  Lymphocyte # Latest Ref Range: 0.7 - 4.0 K/uL 2.5  Monocyte # Latest Ref Range: 0.1 - 1.0 K/uL 0.5  Eosinophils Absolute Latest Ref Range: 0.0 - 0.7 K/uL 0.2  Basophils Absolute Latest Ref Range: 0.0 - 0.1 K/uL 0.1  T4,Free(Direct) Latest Ref Range: 0.60 - 1.60 ng/dL 0.40 (L)   Recommendations Decrease methimazole 10 mg to 1.5 tabs daily  Will continue to monitor Alk. Phos     Abby Nena Jordan,  MD  [facility] Endocrinology  [facility] [address] [place], [place] [address] Phone: [phone] FAX: [phone]

## 2020-01-20 ENCOUNTER — Other Ambulatory Visit: Payer: Self-pay | Admitting: Family Medicine

## 2020-01-20 DIAGNOSIS — M545 Low back pain, unspecified: Secondary | ICD-10-CM

## 2020-01-20 DIAGNOSIS — G8929 Other chronic pain: Secondary | ICD-10-CM

## 2020-01-20 DIAGNOSIS — M5416 Radiculopathy, lumbar region: Secondary | ICD-10-CM

## 2020-01-23 ENCOUNTER — Ambulatory Visit
Admission: RE | Admit: 2020-01-23 | Discharge: 2020-01-23 | Disposition: A | Discharge status: Home / Self Care | Payer: 59 | Source: Ambulatory Visit | Attending: Family Medicine | Admitting: Family Medicine

## 2020-01-23 DIAGNOSIS — M545 Low back pain, unspecified: Secondary | ICD-10-CM

## 2020-01-23 DIAGNOSIS — M5416 Radiculopathy, lumbar region: Secondary | ICD-10-CM

## 2020-01-23 DIAGNOSIS — G8929 Other chronic pain: Secondary | ICD-10-CM

## 2020-01-23 IMAGING — MR MR LUMBAR SPINE W/O CM
4 of 5 series · 23 of 48 positions shown · non-contrast
Comparison: None available.

CLINICAL DATA: Initial evaluation for lower back pain with
radiation into the left hip and groin over past 3 weeks.

EXAM:
MRI LUMBAR SPINE WITHOUT CONTRAST
TECHNIQUE: Multiplanar, multisequence MR imaging of the lumbar spine was
performed. No intravenous contrast was administered.

[Series 3: T2 post-contrast · sagittal · 4.0mm · 0.53mm/px · 7 of 16 slices shown]
[im 1/16]
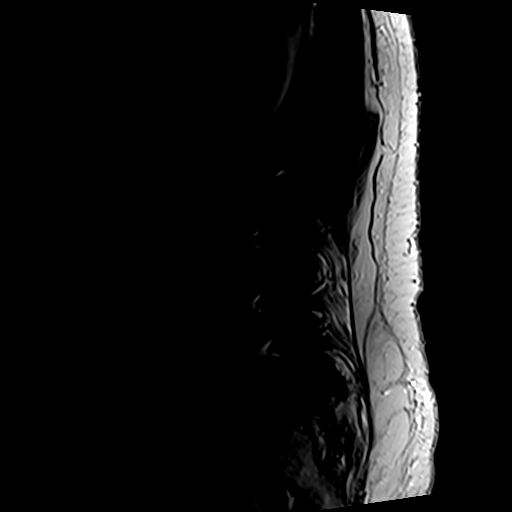
[im 3/16]
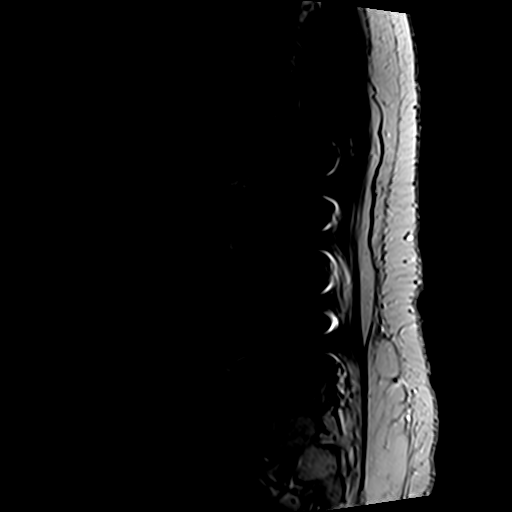
[im 6/16]
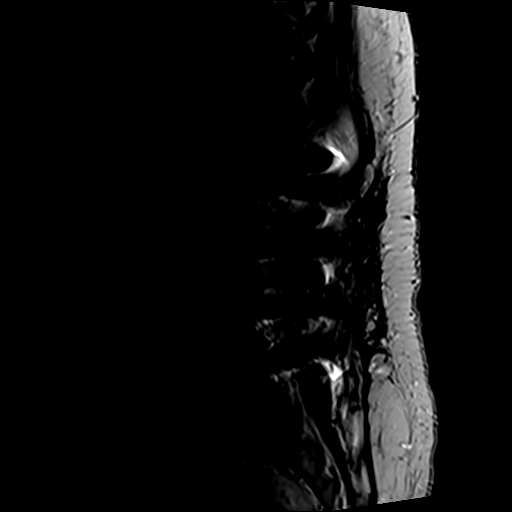
[im 8/16]
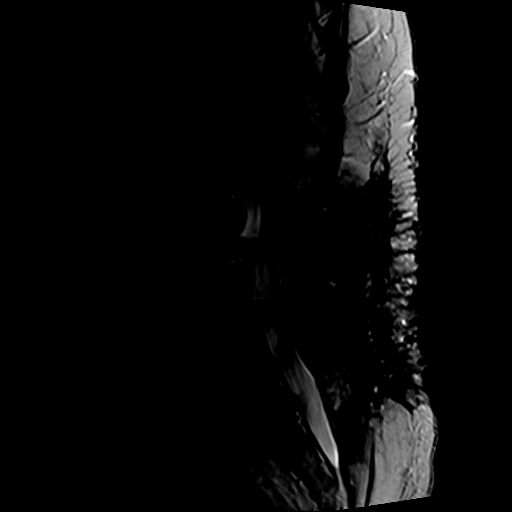
[im 11/16]
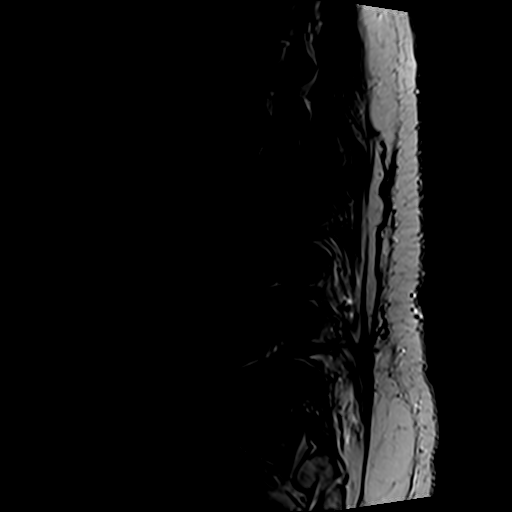
[im 13/16]
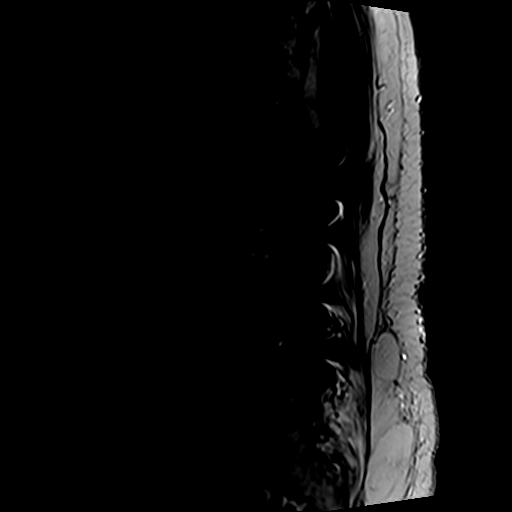
[im 16/16]
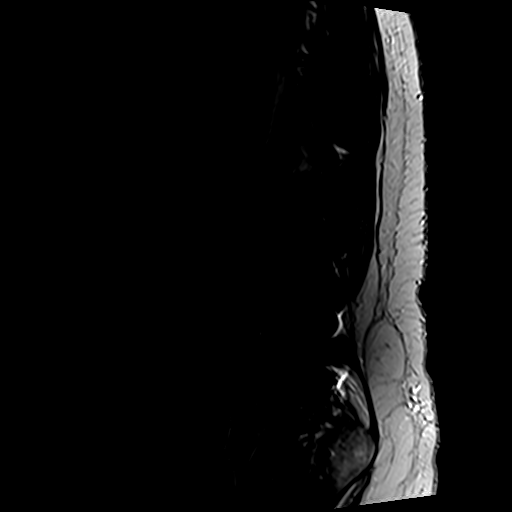

[Series 5: T1 · sagittal · 4.0mm · 0.53mm/px · 5 of 16 slices shown (1 of 2)]
[im 1/16]
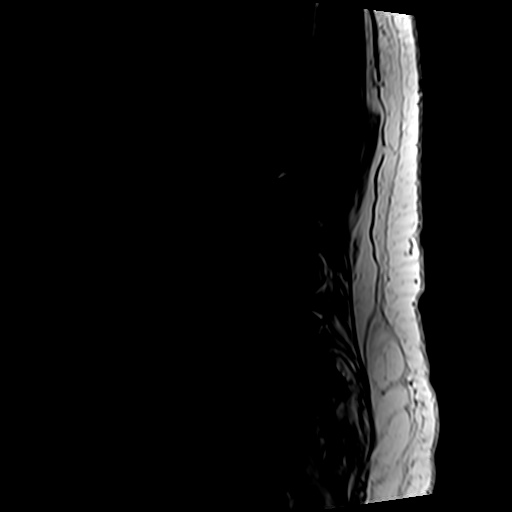
[im 4/16]
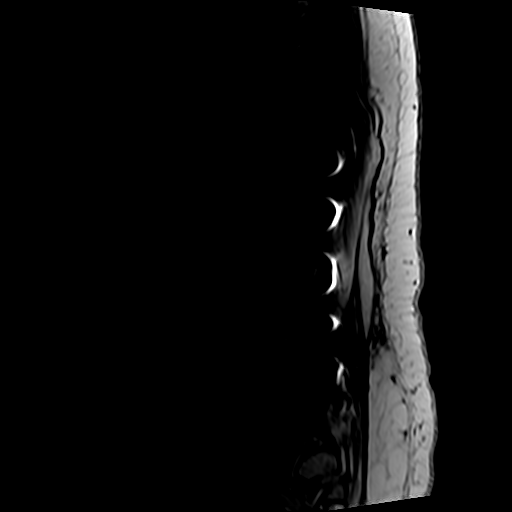
[im 7/16]
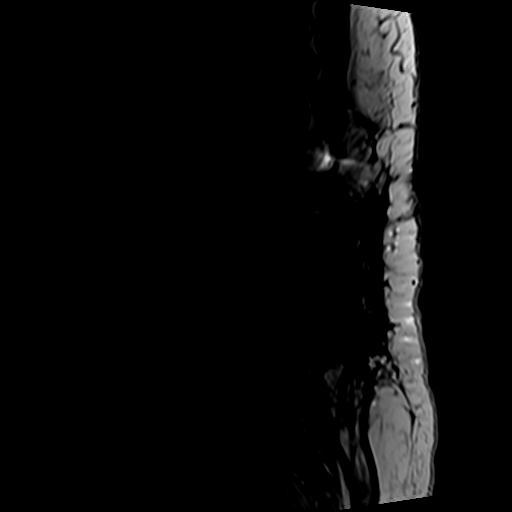
[im 10/16]
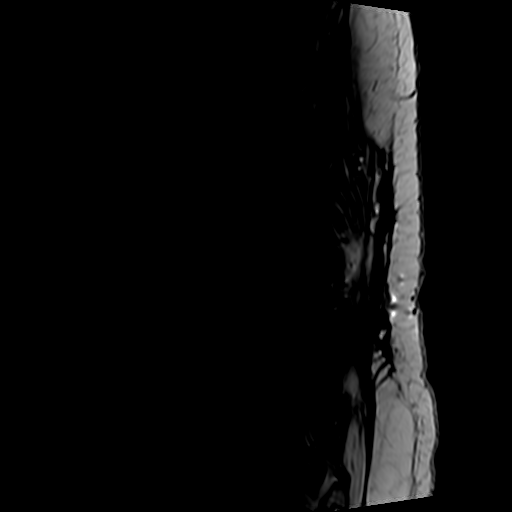
[im 16/16]
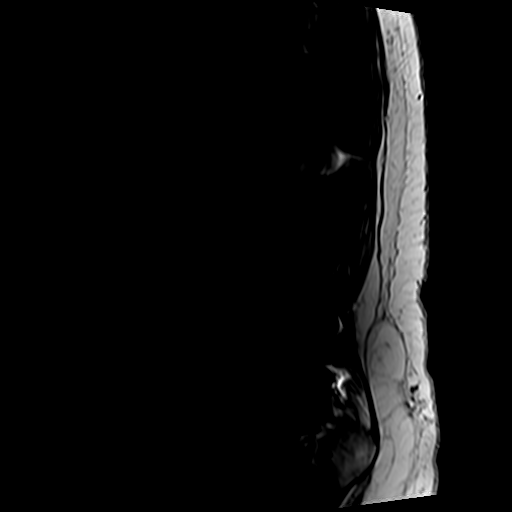

[Series 6: T2 · axial · 4.0mm · 0.70mm/px · z∈[-132,+62]mm · 8 of 36 slices shown]
[im 1/36]
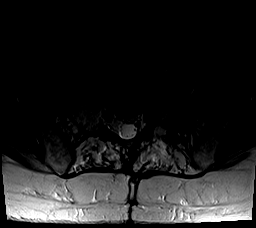
[im 6/36]
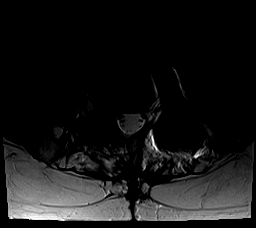
[im 11/36]
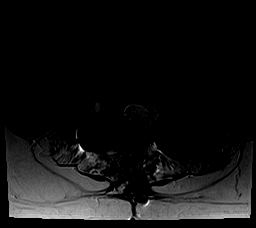
[im 17/36]
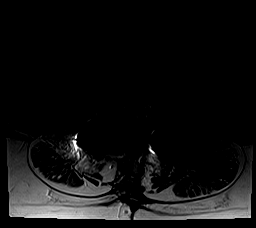
[im 19/36]
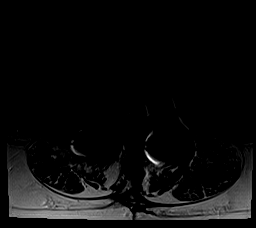
[im 25/36]
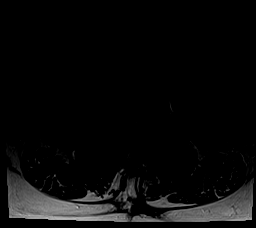
[im 30/36]
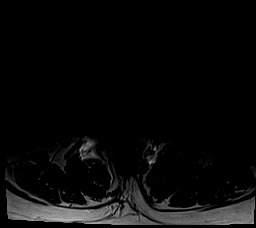
[im 36/36]
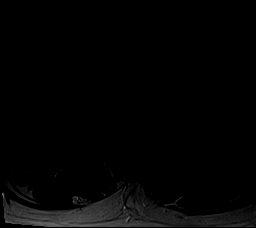

[Series 7: T1 · axial · 4.0mm · 0.35mm/px · z∈[-107,+31]mm · 3 of 36 slices shown (2 of 2)]
[im 6/36]
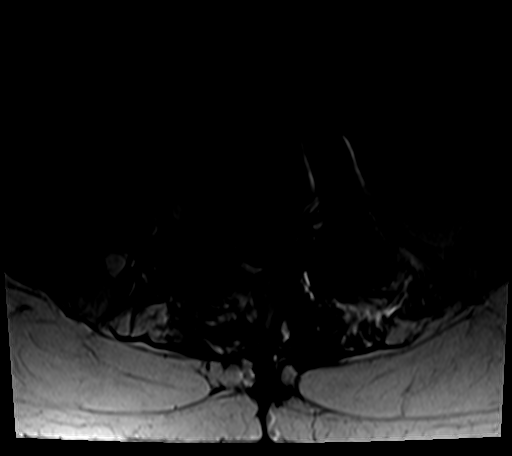
[im 19/36]
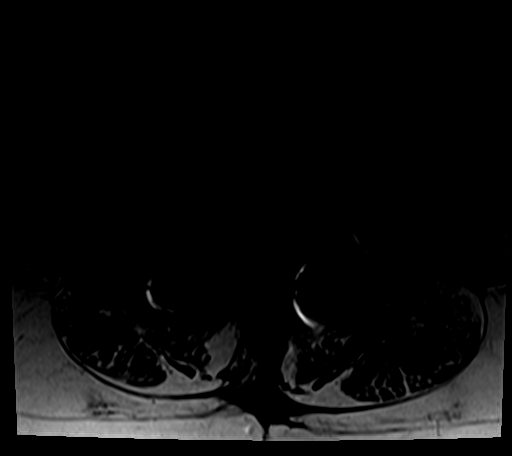
[im 30/36]
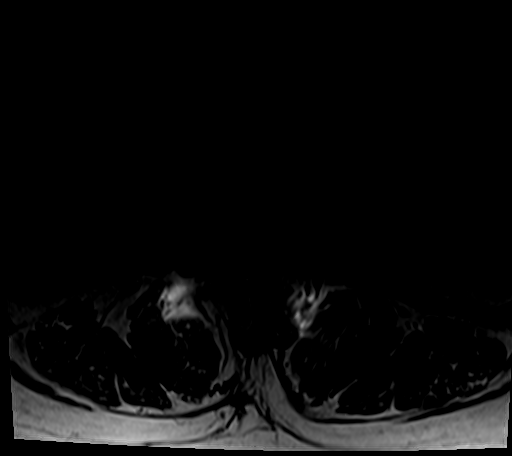

[23 of 48 positions shown; findings below may reference images not displayed]

FINDINGS: Segmentation: Standard. Lowest well-formed disc space labeled the
L5-S1 level.

Alignment: Trace 2 mm retrolisthesis of L1 on L2. Alignment
otherwise normal with preservation of the normal lumbar lordosis.

Vertebrae: Vertebral body height maintained without evidence for
acute or chronic fracture. Bone marrow signal intensity within
normal limits. No discrete or worrisome osseous lesions. Extensive
susceptibility artifact from prior PLIF at L2-3 through L4-5.

Conus medullaris and cauda equina: Conus extends to the L1-2 level.
Conus and cauda equina appear normal.

Paraspinal and other soft tissues: Chronic postoperative changes
present within the posterior paraspinous soft tissues. Paraspinous
soft tissues demonstrate no acute finding. Visualized visceral
structures unremarkable.

Disc levels:

T10-11: Seen only on sagittal projection. Disc bulge with disc
desiccation. No significant stenosis.

T11-12: Seen only on sagittal projection. Diffuse disc bulge with
disc desiccation. No significant spinal stenosis.

T12-L1: Negative interspace.  Mild facet hypertrophy.  No stenosis.

L1-2: Trace retrolisthesis. Mild diffuse disc bulge with disc
desiccation. There is a left foraminal disc protrusion extending
into the inferior left neural foramen, closely approximating and
potentially irritating the exiting left L1 nerve root (series 3,
image 12 superimposed mild facet hypertrophy with resultant mild
left foraminal stenosis. Additionally, there is question of a small
left subarticular disc extrusion with inferior migration (series 6,
image 10), difficult to be certain given adjacent susceptibility
artifact. Suspected mild narrowing of the left lateral recess.
Central canal and right neural foramina remain patent.

L2-3: Prior PLIF. No residual spinal stenosis. Foramina are patent.

L3-4: Prior PLIF. No residual spinal stenosis. Foramina remain
patent.

L4-5: Prior PLIF. No residual spinal stenosis. Foramina remain
patent.

L5-S1: Degenerative intervertebral disc space narrowing with disc
desiccation and mild disc bulge. Associated reactive endplate
spurring. Sequelae of prior left hemi laminectomy and probable micro
discectomy. No residual or recurrent disc herniation. Severe left
worse than right facet degeneration. No residual canal or lateral
recess stenosis. Foramina remain patent.
IMPRESSION: 1. Small left foraminal disc protrusion at L1-2, closely
approximating and potentially irritating the exiting left L1 nerve
root.
2. Suspected additional small left subarticular disc extrusion with
inferior migration at L1-2, which could potentially affect the
descending left L2 nerve root and contribute to left-sided symptoms.
Finding somewhat difficult to be certain of given adjacent
susceptibility artifact.
3. Prior PLIF at L2-3 through L4-5 without residual or recurrent
stenosis.
4. Severe left worse than right facet degeneration at L5-S1 without
stenosis.

## 2020-01-26 ENCOUNTER — Other Ambulatory Visit: Payer: Self-pay

## 2020-01-26 ENCOUNTER — Encounter: Payer: Self-pay | Admitting: Internal Medicine

## 2020-01-26 ENCOUNTER — Ambulatory Visit: Payer: 59 | Admitting: Internal Medicine

## 2020-01-26 VITALS — BP 120/70 | HR 78 | Ht 69.0 in | Wt 238.5 lb

## 2020-01-26 DIAGNOSIS — E0500 Thyrotoxicosis with diffuse goiter without thyrotoxic crisis or storm: Secondary | ICD-10-CM

## 2020-01-26 DIAGNOSIS — E0590 Thyrotoxicosis, unspecified without thyrotoxic crisis or storm: Secondary | ICD-10-CM

## 2020-01-26 LAB — TSH: TSH: 0.02 u[IU]/mL — ABNORMAL LOW (ref 0.35–4.50)

## 2020-01-26 LAB — T4, FREE: Free T4: 0.61 ng/dL (ref 0.60–1.60)

## 2020-01-26 NOTE — Progress Notes (Signed)
Name: [place] Armstrong  MRN/ DOB: [id number], February 28, 1965    Age/ Sex: 54 y.o., female     PCP: [name], MD   Reason for Endocrinology Evaluation: Hyperthyroidism     Initial Endocrinology Clinic Visit: 11/05/2019    PATIENT IDENTIFIER: Carm[place] Armstrong is a 54 y.o., female with a past medical history of HTN, T2DM, PAF , CHF and dyslipidemia. She has followed with [facility] since 11/05/2019 for consultative assistance with management of her Hyperthyroidism.   HISTORICAL SUMMARY:   She was diagnosed with hyperthyroidism in 10/2018 during routine workup . Two months prior to her presentation she was noted with dry eyes. Has occasional double vision, and is under the care of an ophthalmologist.    Pt on [place]  Since 2020. S/P ablation . Has not been on amiodarone.   Methimazole started 10/2019  Paternal grandmother with thyroid disease  SUBJECTIVE:     Today (01/26/2020):  [place] Armstrong is here for hyperthyroidism.    Denies abdominal , or vomiting  Denies fever  Denies local neck symptoms  Sees  An eye specialist at  [facility] - dry eyes   Has back pain - sees a specialist   Methimazole 10 mg , 1.5 tabs daily   HISTORY:  Past Medical History:  Past Medical History:  Diagnosis Date  . Arrhythmia   . Atrial fibrillation ([place])   . CAD (coronary artery disease)   . Diabetes mellitus without complication ([place])   . GAD (generalized anxiety disorder)   . Heart failure ([place])   . Hyperlipidemia   . Hypertension   . Insomnia   . PVD (peripheral vascular disease) ([place])    Past Surgical History:  Past Surgical History:  Procedure Laterality Date  . ANGIOPLASTY     legs  . BACK SURGERY     5 different times  . CARDIAC CATHETERIZATION  2020  . REPLACEMENT TOTAL HIP W/  RESURFACING IMPLANTS Right 2018  . RIGHT AND LEFT HEART CATH    . ROTATOR CUFF REPAIR Right 2016  . SPINE SURGERY  2014,2016,2017,2019  . toe  amputated  09/2018   5th left toe   Social History:  reports that she has been smoking cigarettes. She has a 15.00 pack-year smoking history. She has never used smokeless tobacco. She reports previous alcohol use. She reports previous drug use. Family History:  Family History  Problem Relation Age of Onset  . Hypertension Mother   . Hyperlipidemia Mother   . Diabetes Mother   . Stroke Mother   . Clotting disorder Father   . Hypertension Father   . Hyperlipidemia Father   . Hyperlipidemia Sister   . Diabetes Brother   . Neuropathy Brother      HOME MEDICATIONS: Allergies as of 01/26/2020      Reactions   Other    Hazelnuts- SOB   Penicillin G    whelps   Trulicity [dulaglutide]    Large itchy bumps      Medication List       Accurate as of January 26, 2020  1:43 PM. If you have any questions, ask your nurse or doctor.        ALPRAZolam 0.25 MG tablet Commonly known as: XANAX Take by mouth at bedtime.   aspirin EC 81 MG tablet Take 81 mg by mouth daily. Swallow whole.   atorvastatin 40 MG tablet Commonly known as: LIPITOR Take 40 mg by mouth daily.   baclofen 10 MG tablet  Commonly known as: LIORESAL Take 10 mg by mouth 3 (three) times daily as needed.   candesartan 4 MG tablet Commonly known as: ATACAND Take 4 mg by mouth daily.   DULoxetine 60 MG capsule Commonly known as: CYMBALTA Take 60 mg by mouth daily.   Eliquis 5 MG Tabs tablet Generic drug: apixaban Take 5 mg by mouth 2 (two) times daily.   furosemide 20 MG tablet Commonly known as: LASIX Take 20 mg by mouth daily.   methimazole 10 MG tablet Commonly known as: TAPAZOLE Take 1.5 tablets (15 mg total) by mouth daily.   metoprolol succinate 50 MG 24 hr tablet Commonly known as: TOPROL-XL Take 75 mg by mouth daily.   metoprolol succinate 25 MG 24 hr tablet Commonly known as: TOPROL-XL Take 75 mg by mouth daily.   Multaq 400 MG tablet Generic drug: dronedarone Take 400 mg by mouth  2 (two) times daily.   NovoLOG FlexPen 100 UNIT/ML FlexPen Generic drug: insulin aspart SMARTSIG:20 Unit(s) SUB-Q 3 Times Daily   pregabalin 100 MG capsule Commonly known as: LYRICA Take 100 mg by mouth in the morning, at noon, in the evening, and at bedtime. 1 in the morning, one in the evening, 2 tab at night   sertraline 100 MG tablet Commonly known as: ZOLOFT Take 100 mg by mouth daily.   spironolactone 25 MG tablet Commonly known as: ALDACTONE Take 12.5 mg by mouth daily.   Tyler Aas FlexTouch 100 UNIT/ML FlexTouch Pen Generic drug: insulin degludec SMARTSIG:50 Unit(s) SUB-Q Daily   Victoza 18 MG/3ML Sopn Generic drug: liraglutide Inject 1.8 mg into the skin daily.   Xigduo XR 11-998 MG Tb24 Generic drug: Dapagliflozin-metFORMIN HCl ER Take 1 tablet by mouth in the morning and at bedtime.   zolpidem 12.5 MG CR tablet Commonly known as: AMBIEN CR Take 12.5 mg by mouth at bedtime.         OBJECTIVE:   PHYSICAL EXAM: VS: BP 120/70   Pulse 78   Ht '5\' 9"'  (1.753 m)   Wt 238 lb 8 oz (108.2 kg)   SpO2 98%   BMI 35.22 kg/m    EXAM: General: Pt appears well and is in NAD  Eyes: External eye exam normal without stare, lid lag or exophthalmos.  EOM intact.  PERRL.  Neck: General: Supple without adenopathy. Thyroid: Thyroid size normal.  No goiter or nodules appreciated. No thyroid bruit.  Lungs: Clear with good BS bilat with no rales, rhonchi, or wheezes  Heart: Auscultation: RRR.  Abdomen: Normoactive bowel sounds, soft, nontender, without masses or organomegaly palpable  Extremities:  BL LE: Trace  pretibial edema   Mental Status: Judgment, insight: Intact Mood and affect: No depression, anxiety, or agitation     DATA REVIEWED: Results for Carmen Armstrong, Carmen Armstrong (MRN [id number]) as of 01/27/2020 08:50  Ref. Range 01/26/2020 14:11  TSH Latest Ref Range: 0.35 - 4.50 uIU/mL 0.02 (L)  T4,Free(Direct) Latest Ref Range: 0.60 - 1.60 ng/dL 0.61       11/05/2019  Gluc 111 mg/dL BUN/Cr 13/0.52 Alk phos 165 ( 48-121)  AST 20 Alt 22  Results for [place]en Armstrong, Carmen Armstrong (MRN [id number]) as of 01/26/2020 13:34  Ref. Range 12/17/2019 10:24  TRAB Latest Ref Range: <=2.00 IU/L 30.55 (H)    ASSESSMENT / PLAN / RECOMMENDATIONS:   Hyperthyroidism Secondary to Graves' Disease:    - Pt is clinically euthyroid  - No local neck symptoms - Tolerating methimazole without side effects  - TFT's stable    Medications  Continue Methimazole 10 mg , 1.5 tabs daily     2. Graves' Disease:   No extrathyroidal manifestations of  Graves' Disease.     F/U in 3 months  Labs in 6 weeks     Signed electronically by: Mack Guise, MD  [facility] Endocrinology  [facility] [address] [place], [place] [address] Phone: [phone] FAX: [phone]      CC: [name], [address] [place] [address] Phone: [phone]  Fax: [phone]   Return to Endocrinology clinic as below: Future Appointments  Date Time Provider [place]  03/05/2020 10:30 AM Patwardhan, Reynold Bowen, MD [facility] None

## 2020-01-26 NOTE — Patient Instructions (Signed)
We recommend that you follow these hyperthyroidism instructions at home:  1) Take Methimazole 10 mg, one a half tablets daily  a  If you develop severe sore throat with high fevers OR develop unexplained yellowing of your skin, eyes, under your tongue, severe abdominal pain with nausea or vomiting --> then please get evaluated immediately.  3) Get repeat thyroid labs in 6 weeks    It is ESSENTIAL to get follow-up labs to help avoid over or undertreatment of your hyperthyroidism - both of which can be dangerous to your health.

## 2020-01-27 ENCOUNTER — Telehealth: Payer: Self-pay | Admitting: Internal Medicine

## 2020-01-27 NOTE — Telephone Encounter (Signed)
Spoken to patient and notified Dr Shamleffer's comments. Verbalized understanding.   

## 2020-01-27 NOTE — Telephone Encounter (Signed)
Per DPR, left detail message for patient of Dr Shamleffer's comments 

## 2020-01-27 NOTE — Telephone Encounter (Signed)
Please let the pt know that her thyroid function is stable and to continue on current dose of methimazole 10 mg, one and a half tablets daily      Thanks   Abby Raelyn Mora, MD  [facility] Endocrinology  [facility] [address] [place], [place] [address] Phone: [phone] FAX: [phone]

## 2020-02-16 ENCOUNTER — Encounter: Payer: Self-pay | Admitting: *Deleted

## 2020-02-17 ENCOUNTER — Encounter: Payer: Self-pay | Admitting: Diagnostic Neuroimaging

## 2020-02-17 ENCOUNTER — Ambulatory Visit: Payer: 59 | Admitting: Diagnostic Neuroimaging

## 2020-02-17 VITALS — BP 124/62 | HR 76 | Ht 69.0 in | Wt 228.8 lb

## 2020-02-17 DIAGNOSIS — G8929 Other chronic pain: Secondary | ICD-10-CM

## 2020-02-17 DIAGNOSIS — E1142 Type 2 diabetes mellitus with diabetic polyneuropathy: Secondary | ICD-10-CM | POA: Diagnosis not present

## 2020-02-17 DIAGNOSIS — M5442 Lumbago with sciatica, left side: Secondary | ICD-10-CM | POA: Diagnosis not present

## 2020-02-17 NOTE — Progress Notes (Signed)
[facility]  PATIENT: Carmen Armstrong DOB: August 03, 1965  REFERRING CLINICIAN: [name], MD HISTORY FROM: patient  REASON FOR VISIT: new consult    HISTORICAL  CHIEF COMPLAINT:  Chief Complaint  Patient presents with  . Pain    Rm 6 Lumbar pain    HISTORY OF PRESENT ILLNESS:   UPDATE (02/17/20, VRP): Since last visit, 1 month of increased left low back pain and left hip pain (fell out of bed; fell off of toilet; then more back pain). Back pain radiates to the left hip and pelvis. Symptoms are worse with walking. No incontinence.   PRIOR HPI (12/10/19): 55 year old female here for evaluation of bilateral foot numbness and pain.  Patient has had diabetes for 10 to 15 years, now with peripheral vascular disease, diabetic neuropathy, numbness and pain for past 1 to 2 years.  She had a left foot toe amputation in August 2020.  She had angioplasty for peripheral vascular disease as well.  She has been on Lyrica 100 mg in a.m., 200 mg at noon, 200 mg in p.m. for neuropathy pain.  Also on baclofen for pain relief.  Previously on duloxetine for depression changed to sertraline.  Hemoglobin A1c's have been higher than 11 in the past, with recent A1c 8.6.  Patient has intermittent pain throughout the day, up to 8 out of 10 for hours at a time.  She has some chronic low back pain as well.   REVIEW OF SYSTEMS: Full 14 system review of systems performed and negative with exception of: As per HPI.  ALLERGIES: Allergies  Allergen Reactions  . Other     Hazelnuts- SOB  . Penicillin G     whelps  . Trulicity [Dulaglutide]     Large itchy bumps    HOME MEDICATIONS: Outpatient Medications Prior to Visit  Medication Sig Dispense Refill  . ALPRAZolam (XANAX) 0.5 MG tablet Take by mouth at bedtime.    Marland Kitchen aspirin EC 81 MG tablet Take 81 mg by mouth daily. Swallow whole.    Marland Kitchen atorvastatin (LIPITOR) 40 MG tablet Take 40 mg by mouth daily.    . baclofen (LIORESAL) 10  MG tablet Take 10 mg by mouth 3 (three) times daily as needed.    . candesartan (ATACAND) 4 MG tablet Take 4 mg by mouth daily.    Marland Kitchen ELIQUIS 5 MG TABS tablet Take 5 mg by mouth 2 (two) times daily.    . furosemide (LASIX) 20 MG tablet Take 20 mg by mouth daily.    . methimazole (TAPAZOLE) 10 MG tablet Take 1.5 tablets (15 mg total) by mouth daily. 45 tablet 6  . metoprolol succinate (TOPROL-XL) 25 MG 24 hr tablet Take 75 mg by mouth daily.    . metoprolol succinate (TOPROL-XL) 50 MG 24 hr tablet Take 75 mg by mouth daily.     . MULTAQ 400 MG tablet Take 400 mg by mouth 2 (two) times daily.    Marland Kitchen oxyCODONE (OXY IR/ROXICODONE) 5 MG immediate release tablet Take 5 mg by mouth 4 (four) times daily as needed.    . pregabalin (LYRICA) 200 MG capsule Take 200 mg by mouth 3 (three) times daily.    . sertraline (ZOLOFT) 100 MG tablet Take 100 mg by mouth daily.    Marland Kitchen spironolactone (ALDACTONE) 25 MG tablet Take 12.5 mg by mouth daily.    . TRESIBA FLEXTOUCH 100 UNIT/ML FlexTouch Pen SMARTSIG:50 Unit(s) SUB-Q Daily    . VICTOZA 18 MG/3ML SOPN Inject 1.8  mg into the skin daily.    Marland Kitchen XIGDUO XR 11-998 MG TB24 Take 1 tablet by mouth in the morning and at bedtime.     Marland Kitchen zolpidem (AMBIEN CR) 12.5 MG CR tablet Take 12.5 mg by mouth at bedtime.    . ALPRAZolam (XANAX) 0.25 MG tablet Take by mouth at bedtime.    . pregabalin (LYRICA) 100 MG capsule Take 100 mg by mouth in the morning, at noon, in the evening, and at bedtime. 1 in the morning, one in the evening, 2 tab at night    . DULoxetine (CYMBALTA) 60 MG capsule Take 60 mg by mouth daily. (Patient not taking: Reported on 02/17/2020)    . NOVOLOG FLEXPEN 100 UNIT/ML FlexPen SMARTSIG:20 Unit(s) SUB-Q 3 Times Daily (Patient not taking: Reported on 02/17/2020)     No facility-administered medications prior to visit.    PAST MEDICAL HISTORY: Past Medical History:  Diagnosis Date  . Arrhythmia   . Atrial fibrillation ([facility])   . CAD (coronary artery disease)   .  Chronic lower back pain   . Diabetes mellitus without complication ([facility])   . Eczema   . GAD (generalized anxiety disorder)   . H/O vitamin D deficiency   . Heart failure ([facility])   . Hyperlipidemia   . Hypertension   . Insomnia   . PVD (peripheral vascular disease) ([facility])     PAST SURGICAL HISTORY: Past Surgical History:  Procedure Laterality Date  . ANGIOPLASTY     legs  . BACK SURGERY     5 different times  . CARDIAC CATHETERIZATION  2020  . REPLACEMENT TOTAL HIP W/  RESURFACING IMPLANTS Right 2018  . RIGHT AND LEFT HEART CATH    . ROTATOR CUFF REPAIR Right 2016  . SPINE SURGERY  2014,2016,2017,2019  . toe amputated  09/2018   5th left toe    FAMILY HISTORY: Family History  Problem Relation Age of Onset  . Hypertension Mother   . Hyperlipidemia Mother   . Diabetes Mother   . Stroke Mother   . Clotting disorder Father   . Hypertension Father   . Hyperlipidemia Father   . Hyperlipidemia Sister   . Diabetes Brother   . Neuropathy Brother     SOCIAL HISTORY: Social History   Socioeconomic History  . Marital status: Widowed    Spouse name: Not on file  . Number of children: 0  . Years of education: Not on file  . Highest education level: Bachelor's degree (e.g., BA, AB, BS)  Occupational History    Comment: retired Runner, broadcasting/film/video  Tobacco Use  . Smoking status: Current Every Day Smoker    Packs/day: 0.50    Years: 30.00    Pack years: 15.00    Types: Cigarettes  . Smokeless tobacco: Never Used  Vaping Use  . Vaping Use: Never used  Substance and Sexual Activity  . Alcohol use: Not Currently  . Drug use: Not Currently  . Sexual activity: Not on file  Other Topics Concern  . Not on file  Social History Narrative   Lives with mom, moved from [place] 07/2019   Caffeine- 3 c weekly   Social Determinants of Health   Financial Resource Strain: Not on file  Food Insecurity: Not on file  Transportation Needs: Not on file  Physical Activity: Not on file   Stress: Not on file  Social Connections: Not on file  Intimate Partner Violence: Not on file     PHYSICAL EXAM  GENERAL EXAM/CONSTITUTIONAL:  Vitals:  Vitals:   02/17/20 0907  BP: 124/62  Pulse: 76  Weight: 228 lb 12.8 oz (103.8 kg)  Height: 5\' 9"  (1.753 m)   Body mass index is 33.79 kg/m. Wt Readings from Last 3 Encounters:  02/17/20 228 lb 12.8 oz (103.8 kg)  01/26/20 238 lb 8 oz (108.2 kg)  12/10/19 230 lb (104.3 kg)    Patient is in no distress; well developed, nourished and groomed; neck is supple  CARDIOVASCULAR:  Examination of carotid arteries is normal; no carotid bruits  Regular rate and rhythm, no murmurs  Examination of peripheral vascular system by observation and palpation is normal  EYES:  Ophthalmoscopic exam of optic discs and posterior segments is normal; no papilledema or hemorrhages No exam data present  MUSCULOSKELETAL:  Gait, strength, tone, movements noted in Neurologic exam below  NEUROLOGIC: MENTAL STATUS:  No flowsheet data found.  awake, alert, oriented to person, place and time  recent and remote memory intact  normal attention and concentration  language fluent, comprehension intact, naming intact  fund of knowledge appropriate  CRANIAL NERVE:   2nd - no papilledema on fundoscopic exam  2nd, 3rd, 4th, 6th - pupils equal and reactive to light, visual fields full to confrontation, extraocular muscles intact, no nystagmus  5th - facial sensation symmetric  7th - facial strength symmetric  8th - hearing intact  9th - palate elevates symmetrically, uvula midline  11th - shoulder shrug symmetric  12th - tongue protrusion midline  MOTOR:   normal bulk and tone, full strength in the BUE, BLE; EXCEPT LIMITED HIP FLEX DUE TO PAIN  SENSORY:   normal and symmetric to light touch, temperature, vibration; DECR IN FEET / ANKLES  COORDINATION:   finger-nose-finger, fine finger movements normal  REFLEXES:   deep  tendon reflexes TRACE and symmetric  GAIT/STATION:   narrow based gait; ANTALGIC GAIT     DIAGNOSTIC DATA (LABS, IMAGING, TESTING) - I reviewed patient records, labs, notes, testing and imaging myself where available.  Lab Results  Component Value Date   WBC 6.5 12/17/2019   HGB 12.8 12/17/2019   HCT 40.6 12/17/2019   MCV 79.5 12/17/2019   PLT 236.0 12/17/2019      Component Value Date/Time   NA 138 12/17/2019 1024   K 4.6 12/17/2019 1024   CL 103 12/17/2019 1024   CO2 28 12/17/2019 1024   GLUCOSE 132 (H) 12/17/2019 1024   BUN 9 12/17/2019 1024   CREATININE 0.74 12/17/2019 1024   CALCIUM 9.4 12/17/2019 1024   PROT 7.8 12/17/2019 1024   ALBUMIN 4.1 12/17/2019 1024   AST 13 12/17/2019 1024   ALT 12 12/17/2019 1024   ALKPHOS 174 (H) 12/17/2019 1024   BILITOT 0.4 12/17/2019 1024   No results found for: CHOL, HDL, LDLCALC, LDLDIRECT, TRIG, CHOLHDL No results found for: 13/04/2019 No results found for: VITAMINB12 Lab Results  Component Value Date   TSH 0.02 (L) 01/26/2020    01/23/20 MRI lumbar spine [I reviewed images myself and agree with interpretation. -VRP]  1. Small left foraminal disc protrusion at L1-2, closely  approximating and potentially irritating the exiting left L1 nerve root. 2. Suspected additional small left subarticular disc extrusion with inferior migration at L1-2, which could potentially affect the descending left L2 nerve root and contribute to left-sided symptoms. Finding somewhat difficult to be certain of given adjacent susceptibility artifact. 3. Prior PLIF at L2-3 through L4-5 without residual or recurrent stenosis. 4. Severe left worse than right facet degeneration  at L5-S1 without stenosis.    ASSESSMENT AND PLAN  55 y.o. year old female here with bilateral foot numbness and pain since August 2020, likely related to diabetic neuropathy and peripheral vascular disease. Also worsening low back pain radiating to left leg, h/o lumbar  spine fusion in 2018.    Dx:  1. Diabetic polyneuropathy associated with type 2 diabetes mellitus ([facility])   2. Chronic left-sided low back pain with left-sided sciatica      PLAN:  DIABETIC NEUROPATHY / PERIPHERAL VASCULAR DISEASE / PAIN IN FEET - continue diabetes control - increase lyrica and baclofen - add alpha-lipoic acid and B-complex  CHRONIC LOW BACK PAIN (s/p PLIF L2-3 to L4-5; 2018 in [place]) - MRI lumbar overall ok, with chronic post-surgical changes; recommend referral to PT  Orders Placed This Encounter  Procedures  . Ambulatory referral to Physical Therapy   Return for return to PCP.    Suanne Marker, MD 02/17/2020, 10:15 AM Certified in Neurology, Neurophysiology and Neuroimaging  [facility] [address] [place], [place] [address] [phone]

## 2020-02-17 NOTE — Patient Instructions (Signed)
DIABETIC NEUROPATHY / PERIPHERAL VASCULAR DISEASE / PAIN IN FEET - continue diabetes control - continue lyrica and baclofen - add alpha-lipoic acid and B-complex  CHRONIC LOW BACK PAIN (s/p PLIF L2-3 to L4-5; 2018 in [place]) - MRI lumbar overall ok, with chronic post-surgical changes; recommend referral to PT

## 2020-02-27 ENCOUNTER — Other Ambulatory Visit: Payer: Self-pay | Admitting: Family Medicine

## 2020-02-27 DIAGNOSIS — Z1231 Encounter for screening mammogram for malignant neoplasm of breast: Secondary | ICD-10-CM

## 2020-03-05 ENCOUNTER — Ambulatory Visit: Payer: 59 | Admitting: Cardiology

## 2020-03-10 ENCOUNTER — Other Ambulatory Visit: Payer: Self-pay

## 2020-03-10 ENCOUNTER — Other Ambulatory Visit (INDEPENDENT_AMBULATORY_CARE_PROVIDER_SITE_OTHER): Payer: 59

## 2020-03-10 DIAGNOSIS — E0590 Thyrotoxicosis, unspecified without thyrotoxic crisis or storm: Secondary | ICD-10-CM

## 2020-03-10 LAB — T4, FREE: Free T4: 1.07 ng/dL (ref 0.60–1.60)

## 2020-03-10 LAB — TSH: TSH: <0.01 u[IU]/mL — ABNORMAL LOW (ref 0.35–4.50)

## 2020-03-19 NOTE — Progress Notes (Signed)
Primary Physician/Referring:  [name], MD  Patient ID: Carmen Armstrong, female    DOB: 03/15/1965, 55 y.o.   MRN: [id number]  No chief complaint on file.  HPI:    Carmen Armstrong  is a 55 y.o. with hypertension, hyperlipidemia, type 2 DM, PAD, depression. History of ablation for atrial fibrillation in February and April 2021. She had ulcer of her left fifth toe, with worsening gangrene despite revascularization of right and left lower extremities, required amputation.   Patient has chronic, stable neuropathy in her feet bilaterally, without claudication, nonhealing ulcers or wounds. She is presently on Eliquis and aspirin. She has been unable to tolerate Plavix in the past due to rash.   Patient presents for 6 month follow up of hypertension, hyperlipidemia, and PAD.  Patient's most recent A1c 1 month ago was 6.7%.  Unfortunately case she continues to smoke half a pack per day. She has tried Chantix, nicotine patch, gum, hypnosis without any avail.  She is tolerating Eliquis without bleeding diathesis.  She continues to live a fairly sedentary life without a formal exercise routine.  Patient denies chest pain, dyspnea, leg edema, orthopnea, PND, palpitations, syncope, near syncope.  She is presently taking furosemide 20 mg once daily.  Patient does not regularly check her blood pressure at home, however states averages 133/65 mmHg.  Past Medical History:  Diagnosis Date  . Arrhythmia   . Atrial fibrillation ([place])   . CAD (coronary artery disease)   . Chronic lower back pain   . Diabetes mellitus without complication ([place])   . Eczema   . GAD (generalized anxiety disorder)   . H/O vitamin D deficiency   . Heart failure ([place])   . Hyperlipidemia   . Hypertension   . Insomnia   . PVD (peripheral vascular disease) ([place])    Past Surgical History:  Procedure Laterality Date  . ANGIOPLASTY     legs  . BACK SURGERY     5 different times  . CARDIAC CATHETERIZATION   2020  . REPLACEMENT TOTAL HIP W/  RESURFACING IMPLANTS Right 2018  . RIGHT AND LEFT HEART CATH    . ROTATOR CUFF REPAIR Right 2016  . SPINE SURGERY  2014,2016,2017,2019  . toe amputated  09/2018   5th left toe   Family History  Problem Relation Age of Onset  . Hypertension Mother   . Hyperlipidemia Mother   . Diabetes Mother   . Stroke Mother   . Clotting disorder Father   . Hypertension Father   . Hyperlipidemia Father   . Hyperlipidemia Sister   . Diabetes Brother   . Neuropathy Brother     Social History   Tobacco Use  . Smoking status: Current Every Day Smoker    Packs/day: 0.50    Years: 30.00    Pack years: 15.00    Types: Cigarettes  . Smokeless tobacco: Never Used  Substance Use Topics  . Alcohol use: Not Currently   Marital Status: Widowed   ROS  Review of Systems  Constitutional: Negative for malaise/fatigue and weight gain.  Cardiovascular: Negative for chest pain, claudication, leg swelling, near-syncope, orthopnea, palpitations, paroxysmal nocturnal dyspnea and syncope.  Respiratory: Negative for shortness of breath.   Hematologic/Lymphatic: Does not bruise/bleed easily.  Musculoskeletal: Positive for back pain and joint pain.  Gastrointestinal: Negative for melena.  Neurological: Positive for paresthesias (Bilateral feet). Negative for dizziness and weakness.    Objective  Blood pressure (!) 147/75, pulse 86, temperature 98 F (36.7 C), temperature  source Temporal, resp. rate 17, height '5\' 9"'  (1.753 m), weight 239 lb 12.8 oz (108.8 kg), SpO2 97 %.  Vitals with BMI 03/22/2020 02/17/2020 01/26/2020  Height '5\' 9"'  '5\' 9"'  '5\' 9"'   Weight 239 lbs 13 oz 228 lbs 13 oz 238 lbs 8 oz  BMI 35.4 45.62 56.3  Systolic 893 734 287  Diastolic 75 62 70  Pulse 86 76 78      Physical Exam Vitals reviewed.  HENT:     Head: Normocephalic and atraumatic.  Neck:     Vascular: Carotid bruit (right > left ) present. No JVD.  Cardiovascular:     Rate and Rhythm: Normal  rate and regular rhythm.     Pulses: Intact distal pulses.          Femoral pulses are 2+ on the right side and 2+ on the left side.      Popliteal pulses are 1+ on the right side and 1+ on the left side.       Dorsalis pedis pulses are 0 on the right side and 0 on the left side.       Posterior tibial pulses are 1+ on the right side and 0 on the left side.     Heart sounds: S1 normal and S2 normal. Murmur heard.   Harsh midsystolic murmur is present at the upper right sternal border radiating to the neck. No gallop.      Comments: Weak/absent pedal pulses, but well perfused warm feet without any open ulcers, wounds, gangrene. Well-healed scar at left fifth toe s/p amputation Pulmonary:     Effort: Pulmonary effort is normal. No respiratory distress.     Breath sounds: No wheezing, rhonchi or rales.  Musculoskeletal:     Right lower leg: No edema.     Left lower leg: No edema.  Skin:    General: Skin is warm and dry.     Capillary Refill: Capillary refill takes less than 2 seconds.     Findings: No erythema, lesion or rash.  Neurological:     General: No focal deficit present.     Mental Status: She is alert and oriented to person, place, and time.  Psychiatric:        Mood and Affect: Mood normal.        Behavior: Behavior normal.    Laboratory examination:   Recent Labs    12/17/19 1024  NA 138  K 4.6  CL 103  CO2 28  GLUCOSE 132*  BUN 9  CREATININE 0.74  CALCIUM 9.4   CrCl cannot be calculated (Patient's most recent lab result is older than the maximum 21 days allowed.).  CMP Latest Ref Rng & Units 12/17/2019  Glucose 70 - 99 mg/dL 132(H)  BUN 6 - 23 mg/dL 9  Creatinine 0.40 - 1.20 mg/dL 0.74  Sodium 135 - 145 mEq/L 138  Potassium 3.5 - 5.1 mEq/L 4.6  Chloride 96 - 112 mEq/L 103  CO2 19 - 32 mEq/L 28  Calcium 8.4 - 10.5 mg/dL 9.4  Total Protein 6.0 - 8.3 g/dL 7.8  Total Bilirubin 0.2 - 1.2 mg/dL 0.4  Alkaline Phos 39 - 117 U/L 174(H)  AST 0 - 37 U/L 13  ALT  0 - 35 U/L 12   CBC Latest Ref Rng & Units 12/17/2019  WBC 4.0 - 10.5 K/uL 6.5  Hemoglobin 12.0 - 15.0 g/dL 12.8  Hematocrit 36.0 - 46.0 % 40.6  Platelets 150.0 - 400.0 K/uL 236.0  Lipid Panel No results for input(s): CHOL, TRIG, LDLCALC, VLDL, HDL, CHOLHDL, LDLDIRECT in the last 8760 hours.  HEMOGLOBIN A1C No results found for: HGBA1C, MPG TSH Recent Labs    01/26/20 1411 03/10/20 1107  TSH 0.02* <0.01 Repeated and verified X2.*    External labs:  01/27/2020: Hemoglobin 12.4, hematocrit 39.9, MCV 82, platelets 222 Total cholesterol 83, triglycerides 68, HDL 32, LDL 36 Glucose 92, BUN 23, creatinine 1.07, GFR 59, sodium 141, potassium 4.5, alk phos 178, CMP otherwise normal TSH 0.022  Medications and allergies   Allergies  Allergen Reactions  . Other     Hazelnuts- SOB  . Penicillin G     whelps  . Trulicity [Dulaglutide]     Large itchy bumps     Outpatient Medications Prior to Visit  Medication Sig Dispense Refill  . ALPRAZolam (XANAX) 0.5 MG tablet Take by mouth at bedtime.    Marland Kitchen aspirin EC 81 MG tablet Take 81 mg by mouth daily. Swallow whole.    Marland Kitchen atorvastatin (LIPITOR) 40 MG tablet Take 40 mg by mouth daily.    . baclofen (LIORESAL) 10 MG tablet Take 10 mg by mouth 3 (three) times daily as needed.    . candesartan (ATACAND) 4 MG tablet Take 4 mg by mouth daily.    Marland Kitchen ELIQUIS 5 MG TABS tablet Take 5 mg by mouth 2 (two) times daily.    . furosemide (LASIX) 20 MG tablet Take 20 mg by mouth daily.    . methimazole (TAPAZOLE) 10 MG tablet Take 1.5 tablets (15 mg total) by mouth daily. 45 tablet 6  . metoprolol succinate (TOPROL-XL) 100 MG 24 hr tablet Take 100 mg by mouth daily.    . MULTAQ 400 MG tablet Take 400 mg by mouth 2 (two) times daily.    Marland Kitchen oxyCODONE (OXY IR/ROXICODONE) 5 MG immediate release tablet Take 5 mg by mouth 4 (four) times daily as needed.    . pregabalin (LYRICA) 200 MG capsule Take 200 mg by mouth 3 (three) times daily.    . sertraline  (ZOLOFT) 100 MG tablet Take 100 mg by mouth daily.    . TRESIBA FLEXTOUCH 100 UNIT/ML FlexTouch Pen SMARTSIG:50 Unit(s) SUB-Q Daily    . triamcinolone cream (KENALOG) 0.5 % Apply 1 application topically 2 (two) times daily.    Marland Kitchen VICTOZA 18 MG/3ML SOPN Inject 1.8 mg into the skin daily.    Marland Kitchen XIGDUO XR 11-998 MG TB24 Take 1 tablet by mouth in the morning and at bedtime.     Marland Kitchen spironolactone (ALDACTONE) 25 MG tablet Take 12.5 mg by mouth daily.    Marland Kitchen zolpidem (AMBIEN CR) 12.5 MG CR tablet Take 12.5 mg by mouth at bedtime.    . DULoxetine (CYMBALTA) 60 MG capsule Take 60 mg by mouth daily.    . metoprolol succinate (TOPROL-XL) 25 MG 24 hr tablet Take 75 mg by mouth daily.    Marland Kitchen NOVOLOG FLEXPEN 100 UNIT/ML FlexPen      No facility-administered medications prior to visit.     Radiology:   No results found.  Cardiac Studies:   ABI 09/05/2019:  This exam reveals mildly decreased perfusion of the right lower extremity, noted at the post tibial artery level with monophasic waveform(ABI 0.96).   The right anterior tibial artery shows dampened monophasic waveform suggestive of severe diffuse disease or occlusion.  This exam reveals mildly decreased perfusion of the left lower extremity, noted at the anterior tibial and post tibial artery level (ABI 0.96).  EKG:   EKG 03/22/2020: Sinus rhythm at a rate of 82 bpm, left atrial enlargement.  Normal axis.  Cannot exclude anteroseptal infarct old.  Low voltage complexes.  EKG 09/02/2019: Sinus rhythm 81 bpm. Cannot exclude old anteroseptal infarct.  Assessment     ICD-10-CM   1. PVD (peripheral vascular disease) ([facility])  I73.9 PCV CAROTID DUPLEX (BILATERAL)  2. Longstanding persistent atrial fibrillation ([facility])  I48.11   3. Essential hypertension  I10 EKG 48-LTYV    Basic metabolic panel  4. Tobacco abuse  Z72.0   5. Bruit of right carotid artery  R09.89 PCV CAROTID DUPLEX (BILATERAL)     Medications Discontinued During This Encounter   Medication Reason  . metoprolol succinate (TOPROL-XL) 25 MG 24 hr tablet Change in therapy  . NOVOLOG FLEXPEN 100 UNIT/ML FlexPen Patient has not taken in last 30 days  . DULoxetine (CYMBALTA) 60 MG capsule Patient has not taken in last 30 days  . spironolactone (ALDACTONE) 25 MG tablet Reorder    Meds ordered this encounter  Medications  . spironolactone (ALDACTONE) 25 MG tablet    Sig: Take 1 tablet (25 mg total) by mouth daily.    Dispense:  30 tablet    Refill:  3    Recommendations:   Carmen Armstrong is a 55 y.o.  with hypertension, hyperlipidemia, type 2 DM, PAD, depression. History of ablation for atrial fibrillation in February and April 2021. She had ulcer of her left fifth toe, with worsening gangrene despite revascularization of right and left lower extremities, required amputation.   PAD: No lifestyle limiting claudication, or critical limb ischemia. Suspect feet pain is due to underlying neuropathy ABI revealed mildly decreased perfusion of the left lower extremity, noted at the anterior tibial and post tibial artery level (ABI 0.96)  Continue aspirin, Lipitor Continue management of hypertension, hyperlipidemia, diabetes, and tobacco use  Patient unable to tolerate Plavix due to rash   Paroxysmal A. Fib: History of prior ablations.   Maintaining sinus rhythm. CHA2DS2VASc score 4, annual stroke risk 5%. Tolerating anticoagulation without bleeding diathesis  Continue Eliquis 5 mg twice daily  Hypertension: Elevated in office today.  Continue candesartan, metoprolol succinate, and furosemide. Increase spironolactone from 12.5 mg to 25 mg daily.  Repeat BMP in 1 week.  Type 2 diabetes mellitus: Most recent A1c 6.7%.   Will defer further management of diabetes to PCP  Right carotid artery bruit:  On exam today new right carotid artery bruits noted. Obtain carotid artery duplex.  Tobacco dependence: Counseled patient regarding the importance of tobacco  use cessation as it increases risks of stroke, cancers, and MI. Patient is presently working hard to quit, although she has had difficulty despite trying various methods to quit.  I have offered to prescribe nicotine replacement therapy, however patient prefers to continue to work on tobacco cessation independently.  I personally reviewed external labs, lipids are under excellent control.  Follow-up in 8 weeks, sooner if needed, for hypertension and results of cardiac testing.  During this visit I reviewed and updated: Tobacco history  allergies medication reconciliation  medical history  surgical history  family history  social history.    Alethia Berthold, PA-C 03/22/2020, 2:52 PM Office: [phone]

## 2020-03-22 ENCOUNTER — Encounter: Payer: Self-pay | Admitting: Student

## 2020-03-22 ENCOUNTER — Other Ambulatory Visit: Payer: Self-pay

## 2020-03-22 ENCOUNTER — Ambulatory Visit: Payer: 59 | Admitting: Student

## 2020-03-22 VITALS — BP 147/75 | HR 86 | Temp 98.0°F | Resp 17 | Ht 69.0 in | Wt 239.8 lb

## 2020-03-22 DIAGNOSIS — I4811 Longstanding persistent atrial fibrillation: Secondary | ICD-10-CM

## 2020-03-22 DIAGNOSIS — I10 Essential (primary) hypertension: Secondary | ICD-10-CM

## 2020-03-22 DIAGNOSIS — R0989 Other specified symptoms and signs involving the circulatory and respiratory systems: Secondary | ICD-10-CM

## 2020-03-22 DIAGNOSIS — I739 Peripheral vascular disease, unspecified: Secondary | ICD-10-CM

## 2020-03-22 DIAGNOSIS — Z720 Tobacco use: Secondary | ICD-10-CM

## 2020-03-22 MED ORDER — SPIRONOLACTONE 25 MG PO TABS: 25.0000 mg | ORAL_TABLET | Freq: Every day | ORAL | 3 refills | Status: DC

## 2020-03-22 NOTE — Patient Instructions (Signed)
Labs at [facility] in 7 -10 days.  

## 2020-03-25 ENCOUNTER — Ambulatory Visit: Payer: 59 | Attending: Diagnostic Neuroimaging

## 2020-03-25 ENCOUNTER — Other Ambulatory Visit: Payer: Self-pay

## 2020-03-25 DIAGNOSIS — R293 Abnormal posture: Secondary | ICD-10-CM | POA: Insufficient documentation

## 2020-03-25 DIAGNOSIS — M6281 Muscle weakness (generalized): Secondary | ICD-10-CM | POA: Diagnosis present

## 2020-03-25 DIAGNOSIS — R262 Difficulty in walking, not elsewhere classified: Secondary | ICD-10-CM | POA: Diagnosis present

## 2020-03-25 DIAGNOSIS — G8929 Other chronic pain: Secondary | ICD-10-CM | POA: Insufficient documentation

## 2020-03-25 DIAGNOSIS — M5442 Lumbago with sciatica, left side: Secondary | ICD-10-CM | POA: Insufficient documentation

## 2020-03-25 NOTE — Patient Instructions (Signed)
Access Code: [email or web address] URL: [email or web address] Date: 03/25/2020 Prepared by: Gardiner Rhyme  Exercises Supine Posterior Pelvic Tilt - 2 x daily - 7 x weekly - 2 sets - 10 reps - 3-5 seconds hold Supine Figure 4 Piriformis Stretch - 2 x daily - 7 x weekly - 3 sets - 20 seconds hold Modified Thomas Stretch - 2 x daily - 7 x weekly - 2-3 sets - 20-30 seconds hold Hooklying Lumbar Rotation - 2 x daily - 7 x weekly - 1-2 sets - 10 reps

## 2020-03-25 NOTE — Therapy (Signed)
[facility] Outpatient Rehabilitation [facility] [address] [place], [place], [address] Phone: [phone]   Fax:  [phone]  Physical Therapy Evaluation  Patient Details  Name: Carmen Armstrong MRN: [id number] Date of Birth: 11/20/1965 Referring Provider (PT): Joycelyn Schmid, MD   Encounter Date: 03/25/2020   PT End of Session - 03/25/20 1026    Visit Number 1    Number of Visits 12    Date for PT Re-Evaluation 05/20/20    Authorization Type [employer]    Authorization Time Period FOTO visit 6, 10    PT Start Time 1017    PT Stop Time 1100    PT Time Calculation (min) 43 min    Equipment Utilized During Treatment --   [facility]   Activity Tolerance Patient tolerated treatment well    Behavior During Therapy [facility] for tasks assessed/performed           Past Medical History:  Diagnosis Date  . Arrhythmia   . Atrial fibrillation (HCC)   . CAD (coronary artery disease)   . Chronic lower back pain   . Diabetes mellitus without complication (HCC)   . Eczema   . GAD (generalized anxiety disorder)   . H/O vitamin D deficiency   . Heart failure (HCC)   . Hyperlipidemia   . Hypertension   . Insomnia   . PVD (peripheral vascular disease) (HCC)     Past Surgical History:  Procedure Laterality Date  . ANGIOPLASTY     legs  . BACK SURGERY     5 different times  . CARDIAC CATHETERIZATION  2020  . REPLACEMENT TOTAL HIP W/  RESURFACING IMPLANTS Right 2018  . RIGHT AND LEFT HEART CATH    . ROTATOR CUFF REPAIR Right 2016  . SPINE SURGERY  2014,2016,2017,2019  . toe amputated  09/2018   5th left toe    There were no vitals filed for this visit.    Subjective Assessment - 03/25/20 1030    Subjective Pt reports back pain for years. She has had 5 back surgeries, one to remove a piece of bone that had been dislodged to her sciatic nerve. Currenty, the more activity she does, the more it hurts causing left buttock pain that has started to go down the back of her  leg. Mainly, she feels it in her left groin area that feels she locks up and feels she may fall up/down the steps. She lost her pinky toe on her left foot secondary to diabetes. Today is the first time she has worn a closed toe shoe in 2 years, normally wears orthopedic shoe or croc.    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 1 hour    How long can you stand comfortably? 15-20 min    How long can you walk comfortably? 15-20 min    Diagnostic tests L hip XR: mild degenerative changes to L hip; Lumbar MRI: 1. Small left foraminal disc protrusion at L1-2, closely  approximating and potentially irritating the exiting left L1 nerve  root.  2. Suspected additional small left subarticular disc extrusion with  inferior migration at L1-2, which could potentially affect the  descending left L2 nerve root and contribute to left-sided symptoms.  Finding somewhat difficult to be certain of given adjacent  susceptibility artifact.  3. Prior PLIF at  L2-3 through L4-5 without residual or recurrent  stenosis.  4. Severe left worse than right facet degeneration at L5-S1 without  stenosis.    Patient Stated Goals Figure out where her pain is coming from and get rid of it once and for all    Currently in Pain? Yes    Pain Score 2    12/10 at worst   Pain Location Back    Pain Orientation Left;Lower    Pain Descriptors / Indicators Sharp   feels like pulling in leg when walking   Pain Type Chronic pain    Pain Radiating Towards buttock and posterior thigh to knee    Pain Onset More than a month ago    Pain Frequency Intermittent    Aggravating Factors  prolonged walking, standing, standing, bending, lifting, transfers sit <> stand    Pain Relieving Factors medication. lying down    Effect of Pain on Daily  Activities affects ability to function unless on medication, has to use cart at grocery store              [facility] PT Assessment - 03/25/20 0001      Assessment   Medical Diagnosis Chronic left-sided low back pain with left-sided sciatica    Referring Provider (PT) Joycelyn Schmid, MD    Onset Date/Surgical Date --   years   Hand Dominance Right    Next MD Visit schedule as needed    Prior Therapy was being treated in [place], [place] here 6 mo ago      Precautions   Precautions None    Required Braces or Orthoses Spinal Brace    Spinal Brace Thoracolumbosacral orthotic      Restrictions   Weight Bearing Restrictions No      Balance Screen   Has the patient fallen in the past 6 months Yes    How many times? 5x   2x out of bed middle of night, 1x off stool on L side, off toilet 2x   Has the patient had a decrease in activity level because of a fear of falling?  Yes    Is the patient reluctant to leave their home because of a fear of falling?  No      Home Tourist information centre manager residence      Prior Function   Level of Independence Independent    Vocation Retired    Leisure Scientist, physiological, spend time with family      Observation/Other Assessments   Focus on Therapeutic Outcomes (FOTO)  42% ability, 52% expected      Posture/Postural Control   Posture Comments Mild forward flexed posture,      ROM / Strength   AROM / PROM / Strength AROM;Strength      AROM   Overall AROM Comments with TLSO on, pain with all    AROM Assessment Site Lumbar    Lumbar Flexion 40    Lumbar Extension 8    Lumbar - Right Side Bend 75% 3/4 down thigh    Lumbar - Left Side Bend 50% to midthigh    Lumbar - Right Rotation 50%    Lumbar - Left Rotation 50%      Flexibility   Soft Tissue Assessment /Muscle Length yes    Hamstrings full ROM L, slight decrease R with increased tone and resistance during SLR    Piriformis very difficulty for pt to cross ankle over opposite knee in H/L  for figure 4  Special Tests    Special Tests Lumbar    Lumbar Tests FABER test;Slump Test;Straight Leg Raise      FABER test   findings Positive    Comment B      Slump test   Findings Negative      Straight Leg Raise   Findings Negative    Comment incr tone and resistance R HS      Transfers   Comments increased time, MOD I sit <> stand and sit <> supine      Ambulation/Gait   Ambulation/Gait Yes    Ambulation/Gait Assistance 6: Modified independent (Device/Increase time)    Ambulation Distance (Feet) 75 Feet    Assistive device Straight cane                      Objective measurements completed on examination: See above findings.       [facility] Adult PT Treatment/Exercise - 03/25/20 0001      Self-Care   Self-Care Other Self-Care Comments    Other Self-Care Comments  see pt edu      Exercises   Exercises Lumbar      Lumbar Exercises: Stretches   Lower Trunk Rotation 5 reps;10 seconds    Hip Flexor Stretch 3 reps;20 seconds    Hip Flexor Stretch Limitations modified thomas    Pelvic Tilt 10 reps;5 seconds    Figure 4 Stretch 3 reps;20 seconds                  PT Education - 03/25/20 1411    Education Details FOTO, Diagnosis, Prognosis, HEP, POC    Person(s) Educated Patient    Methods Explanation;Demonstration;Tactile cues;Verbal cues;Handout    Comprehension Verbalized understanding;Returned demonstration            PT Short Term Goals - 03/25/20 1459      PT SHORT TERM GOAL #1   Title Pt will be I and compliant with initial HEP.    Baseline given at eval    Time 2    Period Weeks    Status New    Target Date 04/08/20      PT SHORT TERM GOAL #2   Title Pt will decrease pain to </= 10/10 at worst.    Baseline 12/10    Time 3    Period Weeks    Status New    Target Date 04/15/20             PT Long Term Goals - 03/25/20 1500      PT LONG TERM GOAL #1   Title Pt will be independent with long term HEP for  improved mobility, strength, and endurance.    Time 8    Period Weeks    Status New    Target Date 05/20/20      PT LONG TERM GOAL #2   Title Pt will improve lumbar FOTO ability score to at least 52%, in order to demonstrate significant improvement in perceived level of functional ability.    Baseline 42% ability    Time 8    Period Weeks    Status New    Target Date 05/20/20      PT LONG TERM GOAL #3   Title Pt will increase lumbar AROM to [facility] without pain, as needed for ADLs and IADLs.    Baseline painful and limited in all planes    Time 8    Period Weeks    Status New  Target Date 05/20/20      PT LONG TERM GOAL #4   Title Pt will demonstrate negative FABER B, indicating improvement in hip flexiblity and decreased stress to low back.    Baseline positive B    Time 8    Period Weeks    Status New    Target Date 05/20/20                  Plan - 03/25/20 1039    Clinical Impression Statement Pt is a 55 yo female who presents with chronic low back pain, more recently left-sided with left-sided sciatica to L buttock and sometimes down to knee. Pt ambulates with SPC, c/o pain after 75 feet from waiting room to evaluation room. All lumbar AROM is limited and painful, but mostly with L rotation, L lateral flexion, and forward flexion. Mild strength deficits noted in R hip with ER/IR. Pt was wearing TLSO she was given by pain management and educated on goal of D/C, as core strength progresses. Pt was also educated on FOTO, diagnosis, prognosis, POC, and HEP. She verbalized understanding and consent to all. She will benefit from skilled physical therapy 1-2x/week for 8 weeks to address impairments and maximize functional mobility.    Personal Factors and Comorbidities Comorbidity 3+;Time since onset of injury/illness/exacerbation;Past/Current Experience;Fitness    Comorbidities Grave's Disease, Diabetic Neuropathy, DM, HLD, HTN    Examination-Activity Limitations  Transfers;Sit;Lift;Squat;Bend;Stand;Stairs;Locomotion Level    Examination-Participation Restrictions Shop;Community Activity;Cleaning;Laundry    Stability/Clinical Decision Making Evolving/Moderate complexity    Clinical Decision Making Moderate    Rehab Potential Good    PT Frequency --   1-2x/week   PT Duration 8 weeks    PT Treatment/Interventions ADLs/Self Care Home Management;Cryotherapy;Electrical Stimulation;Moist Heat;Gait training;Functional mobility Network engineer;Therapeutic activities;Therapeutic exercise;Balance training;Neuromuscular re-education;Patient/family education;Manual techniques;Passive range of motion;Dry needling;Taping;Spinal Manipulations;Joint Manipulations    PT Next Visit Plan [id number] and (create LTGs for these); Assess response to HEP/update PRN, PAs to spine for testing, prone lying (with) pillows if possible, gentle flexibility and core/LE strength, posture    PT Home Exercise Plan [id number]    Consulted and Agree with Plan of Care Patient           Patient will benefit from skilled therapeutic intervention in order to improve the following deficits and impairments:  Decreased balance,Postural dysfunction,Pain,Impaired flexibility,Increased fascial restricitons,Decreased strength,Decreased activity tolerance,Decreased range of motion,Impaired perceived functional ability,Improper body mechanics,Hypomobility,Difficulty walking,Decreased endurance  Visit Diagnosis: Chronic left-sided low back pain with left-sided sciatica - Plan: PT plan of care cert/re-cert  Difficulty in walking, not elsewhere classified - Plan: PT plan of care cert/re-cert  Abnormal posture - Plan: PT plan of care cert/re-cert  Muscle weakness (generalized) - Plan: PT plan of care cert/re-cert     Problem List There are no problems to display for this patient.   Marcelline Mates, PT, DPT 03/25/2020, 3:08 PM  [facility] [address] [place], [place], [address] Phone: [phone]   Fax:  [phone]  Name: Carmen Armstrong MRN: [id number] Date of Birth: 1966-02-10

## 2020-03-31 ENCOUNTER — Ambulatory Visit: Payer: 59

## 2020-03-31 ENCOUNTER — Other Ambulatory Visit: Payer: Self-pay

## 2020-03-31 DIAGNOSIS — I739 Peripheral vascular disease, unspecified: Secondary | ICD-10-CM

## 2020-03-31 DIAGNOSIS — R0989 Other specified symptoms and signs involving the circulatory and respiratory systems: Secondary | ICD-10-CM

## 2020-04-04 ENCOUNTER — Other Ambulatory Visit: Payer: Self-pay | Admitting: Cardiology

## 2020-04-04 DIAGNOSIS — I6523 Occlusion and stenosis of bilateral carotid arteries: Secondary | ICD-10-CM

## 2020-04-05 NOTE — Progress Notes (Signed)
Called pt no answer, left a vm

## 2020-04-05 NOTE — Progress Notes (Signed)
Inform patient there is mild stenosis in the right and left carotid arteries.  Discuss further at next visit, and repeat carotid artery duplex in 6 months.

## 2020-04-06 NOTE — Progress Notes (Signed)
Called and spoke with patient regarding her CAD results.

## 2020-04-09 ENCOUNTER — Other Ambulatory Visit: Payer: Self-pay

## 2020-04-09 ENCOUNTER — Ambulatory Visit: Payer: 59

## 2020-04-09 DIAGNOSIS — G8929 Other chronic pain: Secondary | ICD-10-CM

## 2020-04-09 DIAGNOSIS — M5442 Lumbago with sciatica, left side: Secondary | ICD-10-CM

## 2020-04-09 DIAGNOSIS — M6281 Muscle weakness (generalized): Secondary | ICD-10-CM

## 2020-04-09 DIAGNOSIS — R262 Difficulty in walking, not elsewhere classified: Secondary | ICD-10-CM

## 2020-04-09 DIAGNOSIS — R293 Abnormal posture: Secondary | ICD-10-CM

## 2020-04-09 NOTE — Patient Instructions (Signed)
Continue HEP as given.   As long as not painful have family do STW to gluteals

## 2020-04-09 NOTE — Therapy (Signed)
[facility] Outpatient Rehabilitation [facility] [address] [place], [place], [address] Phone: [phone]   Fax:  [phone]  Physical Therapy Treatment  Patient Details  Name: Carmen Armstrong MRN: [id number] Date of Birth: [birth year] Referring Provider (PT): Joycelyn Schmid, MD   Encounter Date: 04/09/2020   PT End of Session - 04/09/20 0915    Visit Number 2    Number of Visits 12    Date for PT Re-Evaluation 05/20/20    Authorization Type [employer]    Authorization Time Period FOTO visit 6, 10    PT Start Time 0915    PT Stop Time 1000    PT Time Calculation (min) 45 min    Activity Tolerance Patient tolerated treatment well;Patient limited by pain    Behavior During Therapy [facility] for tasks assessed/performed           Past Medical History:  Diagnosis Date  . Arrhythmia   . Atrial fibrillation (HCC)   . CAD (coronary artery disease)   . Chronic lower back pain   . Diabetes mellitus without complication (HCC)   . Eczema   . GAD (generalized anxiety disorder)   . H/O vitamin D deficiency   . Heart failure (HCC)   . Hyperlipidemia   . Hypertension   . Insomnia   . PVD (peripheral vascular disease) (HCC)     Past Surgical History:  Procedure Laterality Date  . ANGIOPLASTY     legs  . BACK SURGERY     5 different times  . CARDIAC CATHETERIZATION  2020  . REPLACEMENT TOTAL HIP W/  RESURFACING IMPLANTS Right 2018  . RIGHT AND LEFT HEART CATH    . ROTATOR CUFF REPAIR Right 2016  . SPINE SURGERY  2014,2016,2017,2019  . toe amputated  09/2018   5th left toe    There were no vitals filed for this visit.   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 Yes    Pain Score 5     Pain Location  Back    Pain Orientation Left;Lower    Pain Descriptors / Indicators Sharp    Pain Type Chronic pain    Pain Radiating Towards buttock and post thigh to knee    Pain Onset More than a month ago    Pain Frequency Constant    Aggravating Factors  generally al activity    Pain Relieving Factors meds , lying down.                             [facility] Adult PT Treatment/Exercise - 04/09/20 0001      Ambulation/Gait   Gait Comments 6 min walk test   390 feet 1 rest break. with SPC.      Lumbar Exercises: Stretches   Lower Trunk Rotation 5 reps;10 seconds    Hip Flexor Stretch 3 reps;20 seconds    Hip Flexor Stretch Limitations modified thomas    Pelvic Tilt 10 reps;5 seconds    Figure 4 Stretch 3 reps;20 seconds      Lumbar Exercises: Aerobic   Nustep LE L3 4 min   reported some burning in lower back at end  Lumbar Exercises: Seated   Other Seated Lumbar Exercises 5x sit to stand  41 sec from mat with use of both arms      Manual Therapy   Manual Therapy Soft tissue mobilization;Manual Traction    Soft tissue mobilization LT hip STW    Manual Traction Gr 2 Lt hip                    PT Short Term Goals - 03/25/20 1459      PT SHORT TERM GOAL #1   Title Pt will be I and compliant with initial HEP.    Baseline given at eval    Time 2    Period Weeks    Status New    Target Date 04/08/20      PT SHORT TERM GOAL #2   Title Pt will decrease pain to </= 10/10 at worst.    Baseline 12/10    Time 3    Period Weeks    Status New    Target Date 04/15/20             PT Long Term Goals - 04/09/20 0932      PT LONG TERM GOAL #5   Title sit to stand from mat x 5 in 30 sec or less with use of UE    Baseline 41 sec    Time 6    Period Weeks    Status New      Additional Long Term Goals   Additional Long Term Goals Yes      PT LONG TERM GOAL #6   Title 6 min walk test 500 feet with no rest breaks.    Baseline 390 feet today 1 rest break     Time 6    Period Weeks    Status New                 Plan - 04/09/20 0915    Clinical Impression Statement She felt better post STW. Still limited with pain. New goals set.  Showed her images of Hip muscles and possible influence on TL sided pain.   Possible RT  pelvic upslip with mildly + clavicular jump test on RT.  she appears to be consistent with HEP.    PT Treatment/Interventions ADLs/Self Care Home Management;Cryotherapy;Electrical Stimulation;Moist Heat;Gait training;Functional mobility Network engineer;Therapeutic activities;Therapeutic exercise;Balance training;Neuromuscular re-education;Patient/family education;Manual techniques;Passive range of motion;Dry needling;Taping;Spinal Manipulations;Joint Manipulations    PT Next Visit Plan Continue stretching /mannual/core strength /LE strength.  Monitor nustepo if ussed as she began to have some burning  in lower back with this    PT Home Exercise Plan [id number]    Consulted and Agree with Plan of Care Patient           Patient will benefit from skilled therapeutic intervention in order to improve the following deficits and impairments:  Decreased balance,Postural dysfunction,Pain,Impaired flexibility,Increased fascial restricitons,Decreased strength,Decreased activity tolerance,Decreased range of motion,Impaired perceived functional ability,Improper body mechanics,Hypomobility,Difficulty walking,Decreased endurance  Visit Diagnosis: Chronic left-sided low back pain with left-sided sciatica  Difficulty in walking, not elsewhere classified  Abnormal posture  Muscle weakness (generalized)     Problem List There are no problems to display for this patient.   [name]  PT 04/09/2020, 10:18 AM  [facility] [address] [place], [place], [address] Phone: [phone]   Fax:  [phone]  Name: Carmen Armstrong MRN: [id number] Date of Birth:  12-09-1965

## 2020-04-19 ENCOUNTER — Inpatient Hospital Stay: Admission: RE | Admit: 2020-04-19 | Payer: 59 | Source: Ambulatory Visit

## 2020-04-20 ENCOUNTER — Telehealth: Payer: Self-pay

## 2020-04-20 ENCOUNTER — Ambulatory Visit: Payer: 59 | Attending: Diagnostic Neuroimaging

## 2020-04-20 DIAGNOSIS — R262 Difficulty in walking, not elsewhere classified: Secondary | ICD-10-CM | POA: Insufficient documentation

## 2020-04-20 DIAGNOSIS — R293 Abnormal posture: Secondary | ICD-10-CM | POA: Insufficient documentation

## 2020-04-20 DIAGNOSIS — M6281 Muscle weakness (generalized): Secondary | ICD-10-CM | POA: Insufficient documentation

## 2020-04-20 DIAGNOSIS — G8929 Other chronic pain: Secondary | ICD-10-CM | POA: Insufficient documentation

## 2020-04-20 DIAGNOSIS — M5442 Lumbago with sciatica, left side: Secondary | ICD-10-CM | POA: Insufficient documentation

## 2020-04-20 NOTE — Telephone Encounter (Signed)
Called pt and LVM regarding missed 11am appointment this morning, 3/8. Reminded pt of next appt on 3/11 and asked she please cancel or reschedule with 24 hours notice, if needed.  Bettey Mare. Corliss Marcus, PT, DPT

## 2020-04-23 ENCOUNTER — Ambulatory Visit: Payer: 59

## 2020-04-23 ENCOUNTER — Other Ambulatory Visit: Payer: Self-pay

## 2020-04-23 DIAGNOSIS — M6281 Muscle weakness (generalized): Secondary | ICD-10-CM | POA: Diagnosis present

## 2020-04-23 DIAGNOSIS — R293 Abnormal posture: Secondary | ICD-10-CM

## 2020-04-23 DIAGNOSIS — M5442 Lumbago with sciatica, left side: Secondary | ICD-10-CM

## 2020-04-23 DIAGNOSIS — R262 Difficulty in walking, not elsewhere classified: Secondary | ICD-10-CM

## 2020-04-23 DIAGNOSIS — G8929 Other chronic pain: Secondary | ICD-10-CM | POA: Diagnosis present

## 2020-04-23 NOTE — Therapy (Signed)
[facility] Outpatient Rehabilitation [facility] [address] [place], [place], [address] Phone: [phone]   Fax:  [phone]  Physical Therapy Treatment  Patient Details  Name: Carmen Armstrong MRN: [id number] Date of Birth: 12/07/1965 Referring Provider (PT): Joycelyn Schmid, MD   Encounter Date: 04/23/2020   PT End of Session - 04/23/20 1111    Visit Number 3    Number of Visits 12    Date for PT Re-Evaluation 05/20/20    Authorization Type Aetna    Authorization Time Period FOTO visit 6, 10    PT Start Time 1104    PT Stop Time 1145    PT Time Calculation (min) 41 min    Activity Tolerance Patient tolerated treatment well;Patient limited by pain    Behavior During Therapy [facility] for tasks assessed/performed           Past Medical History:  Diagnosis Date  . Arrhythmia   . Atrial fibrillation (HCC)   . CAD (coronary artery disease)   . Chronic lower back pain   . Diabetes mellitus without complication (HCC)   . Eczema   . GAD (generalized anxiety disorder)   . H/O vitamin D deficiency   . Heart failure (HCC)   . Hyperlipidemia   . Hypertension   . Insomnia   . PVD (peripheral vascular disease) (HCC)     Past Surgical History:  Procedure Laterality Date  . ANGIOPLASTY     legs  . BACK SURGERY     5 different times  . CARDIAC CATHETERIZATION  2020  . REPLACEMENT TOTAL HIP W/  RESURFACING IMPLANTS Right 2018  . RIGHT AND LEFT HEART CATH    . ROTATOR CUFF REPAIR Right 2016  . SPINE SURGERY  2014,2016,2017,2019  . toe amputated  09/2018   5th left toe    There were no vitals filed for this visit.   Subjective Assessment - 04/23/20 1109    Subjective Pt reports her R hip has been hurting really badly and hasn't stopped. She's done her HEP but nothing has changed. Saw pain management yesterday. Her pain has gotten progressively worse.    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 Yes    Pain Score 6     Pain Location Back    Pain Orientation Left;Lower    Pain Descriptors / Indicators Sharp    Pain Type Chronic pain    Pain Onset More than a month ago    Multiple Pain Sites Yes    Pain Score 6    Pain Location Hip    Pain Orientation Right    Pain Descriptors / Indicators Dull;Aching    Pain Type Chronic pain    Pain Onset More than a month ago    Pain Frequency Intermittent                             [facility] Adult PT Treatment/Exercise - 04/23/20 0001      Self-Care   Other Self-Care Comments  see pt edu      Lumbar Exercises: Stretches   Lower Trunk Rotation 5 reps;10 seconds    Hip Flexor Stretch 3 reps;20 seconds    Hip Flexor Stretch Limitations modified thomas    Figure 4 Stretch 3 reps;20 seconds      Lumbar Exercises: Aerobic  Nustep LE L5 5 min      Lumbar Exercises: Supine   Ab Set 10 reps;5 seconds   exhale to squeeze ball and draw TA in, inhale to take a deep breath   AB Set Limitations ball squeeze    Pelvic Tilt 10 reps;5 seconds    Pelvic Tilt Limitations ball squeeze      Manual Therapy   Manual Therapy Soft tissue mobilization    Manual therapy comments with modified thomas S    Soft tissue mobilization to L VL                  PT Education - 04/23/20 1126    Education Details education on reintroducing movements to the body - likelihood of not feeling natural or good initially, but feeling better over time. Standing glute activation for active hip flexor stretch and improved pelvic positioning    Person(s) Educated Patient    Methods Explanation;Verbal cues    Comprehension Verbalized understanding            PT Short Term Goals - 03/25/20 1459      PT SHORT TERM GOAL #1   Title Pt will be I and compliant with initial HEP.    Baseline given at eval    Time 2    Period Weeks    Status New    Target Date 04/08/20       PT SHORT TERM GOAL #2   Title Pt will decrease pain to </= 10/10 at worst.    Baseline 12/10    Time 3    Period Weeks    Status New    Target Date 04/15/20             PT Long Term Goals - 04/09/20 0932      PT LONG TERM GOAL #5   Title sit to stand from mat x 5 in 30 sec or less with use of UE    Baseline 41 sec    Time 6    Period Weeks    Status New      Additional Long Term Goals   Additional Long Term Goals Yes      PT LONG TERM GOAL #6   Title 6 min walk test 500 feet with no rest breaks.    Baseline 390 feet today 1 rest break    Time 6    Period Weeks    Status New                 Plan - 04/23/20 1111    Clinical Impression Statement Pt demonstrates lower crossed syndrome in standing with anterior pelvic tilt and shortened hip flexors. Pt educated on attmempting to tighten glutes it standing for active hip flexor stretch to improve upright posture. She did well with supine exercises to activate TrA and pelvic floor, noting visible lift from pelvic floor and drawing in from TrA. Cramping reported in L TrA after ~10 reps.    PT Treatment/Interventions ADLs/Self Care Home Management;Cryotherapy;Electrical Stimulation;Moist Heat;Gait training;Functional mobility Network engineer;Therapeutic activities;Therapeutic exercise;Balance training;Neuromuscular re-education;Patient/family education;Manual techniques;Passive range of motion;Dry needling;Taping;Spinal Manipulations;Joint Manipulations    PT Next Visit Plan Continue stretching /manual/core strength /LE strength.    PT Home Exercise Plan [id number]    Consulted and Agree with Plan of Care Patient           Patient will benefit from skilled therapeutic intervention in order to improve the following deficits and impairments:  Decreased balance,Postural dysfunction,Pain,Impaired flexibility,Increased  fascial restricitons,Decreased strength,Decreased activity tolerance,Decreased range of  motion,Impaired perceived functional ability,Improper body mechanics,Hypomobility,Difficulty walking,Decreased endurance  Visit Diagnosis: Chronic left-sided low back pain with left-sided sciatica  Difficulty in walking, not elsewhere classified  Abnormal posture  Muscle weakness (generalized)     Problem List There are no problems to display for this patient.   Marcelline Mates, PT, DPT 04/23/2020, 12:37 PM  [facility] [address] [place], [place], [address] Phone: [phone]   Fax:  [phone]  Name: Carmen Armstrong MRN: [id number] Date of Birth: March 31, 1965

## 2020-04-27 ENCOUNTER — Other Ambulatory Visit: Payer: Self-pay

## 2020-04-27 ENCOUNTER — Ambulatory Visit: Payer: 59

## 2020-04-27 DIAGNOSIS — M6281 Muscle weakness (generalized): Secondary | ICD-10-CM

## 2020-04-27 DIAGNOSIS — G8929 Other chronic pain: Secondary | ICD-10-CM

## 2020-04-27 DIAGNOSIS — R293 Abnormal posture: Secondary | ICD-10-CM

## 2020-04-27 DIAGNOSIS — R262 Difficulty in walking, not elsewhere classified: Secondary | ICD-10-CM

## 2020-04-27 DIAGNOSIS — M5442 Lumbago with sciatica, left side: Secondary | ICD-10-CM | POA: Diagnosis not present

## 2020-04-27 NOTE — Progress Notes (Unsigned)
Name: Carmen Armstrong  MRN/ DOB: [id number], May 03, 1965    Age/ Sex: 55 y.o., female     PCP: [name], MD   Reason for Endocrinology Evaluation: Hyperthyroidism     Initial Endocrinology Clinic Visit: 11/05/2019    PATIENT IDENTIFIER: [name] is a 55 y.o., female with a past medical history of HTN, T2DM, PAF , CHF and dyslipidemia. She has followed with [facility] since 11/05/2019 for consultative assistance with management of her Hyperthyroidism.   HISTORICAL SUMMARY:   She was diagnosed with hyperthyroidism in 10/2018 during routine workup . Two months prior to her presentation she was noted with dry eyes. Has occasional double vision, and is under the care of an ophthalmologist.    Pt on [place]  Since 2020. S/P ablation . Has not been on amiodarone.   Methimazole started 10/2019  Paternal grandmother with thyroid disease  SUBJECTIVE:     Today (04/28/2020):  [name] is here for hyperthyroidism.   Has noted weight gain  Denies abdominal , or vomiting  Denies fever  Denies local neck symptoms  Se[facility]  [facility] at  [facility] - dry eyes   Has back pain - sees a specialist and currently going to PT   Methimazole 10 mg , 1.5 tabs daily   HISTORY:  Past Medical History:  Past Medical History:  Diagnosis Date  . Arrhythmia   . Atrial fibrillation ([place])   . CAD (coronary artery disease)   . Chronic lower back pain   . Diabetes mellitus without complication ([place])   . Eczema   . GAD (generalized anxiety disorder)   . H/O vitamin D deficiency   . Heart failure ([place])   . Hyperlipidemia   . Hypertension   . Insomnia   . PVD (peripheral vascular disease) ([place])    Past Surgical History:  Past Surgical History:  Procedure Laterality Date  . ANGIOPLASTY     legs  . BACK SURGERY     5 different times  . CARDIAC CATHETERIZATION  2020  . REPLACEMENT TOTAL HIP W/  RESURFACING IMPLANTS Right 2018   . RIGHT AND LEFT HEART CATH    . ROTATOR CUFF REPAIR Right 2016  . SPINE SURGERY  2014,2016,2017,2019  . toe amputated  09/2018   5th left toe    Social History:  reports that she has been smoking cigarettes. She has a 15.00 pack-year smoking history. She has never used smokeless tobacco. She reports previous alcohol use. She reports previous drug use. Family History:  Family History  Problem Relation Age of Onset  . Hypertension Mother   . Hyperlipidemia Mother   . Diabetes Mother   . Stroke Mother   . Clotting disorder Father   . Hypertension Father   . Hyperlipidemia Father   . Hyperlipidemia Sister   . Diabetes Brother   . Neuropathy Brother      HOME MEDICATIONS: Allergies as of 04/28/2020      Reactions   Other    Hazelnuts- SOB   Penicillin G    whelps   Trulicity [dulaglutide]    Large itchy bumps      Medication List       Accurate as of April 28, 2020 11:21 AM. If you have any questions, ask your nurse or doctor.        ALPRAZolam 0.5 MG tablet Commonly known as: XANAX Take by mouth at bedtime.   aspirin EC 81 MG tablet Take 81 mg  by mouth daily. Swallow whole.   atorvastatin 40 MG tablet Commonly known as: LIPITOR Take 40 mg by mouth daily.   baclofen 10 MG tablet Commonly known as: LIORESAL Take 10 mg by mouth 3 (three) times daily as needed.   candesartan 4 MG tablet Commonly known as: ATACAND Take 4 mg by mouth daily.   Eliquis 5 MG Tabs tablet Generic drug: apixaban Take 5 mg by mouth 2 (two) times daily.   furosemide 20 MG tablet Commonly known as: LASIX Take 20 mg by mouth daily.   methimazole 10 MG tablet Commonly known as: TAPAZOLE Take 1.5 tablets (15 mg total) by mouth daily.   metoprolol succinate 100 MG 24 hr tablet Commonly known as: TOPROL-XL Take 100 mg by mouth daily.   Multaq 400 MG tablet Generic drug: dronedarone Take 400 mg by mouth 2 (two) times daily.   oxyCODONE 5 MG immediate release tablet Commonly  known as: Oxy IR/ROXICODONE Take 5 mg by mouth 4 (four) times daily as needed.   pregabalin 200 MG capsule Commonly known as: LYRICA Take 200 mg by mouth 3 (three) times daily.   sertraline 100 MG tablet Commonly known as: ZOLOFT Take 100 mg by mouth daily.   spironolactone 25 MG tablet Commonly known as: ALDACTONE Take 1 tablet (25 mg total) by mouth daily.   Tyler Aas FlexTouch 100 UNIT/ML FlexTouch Pen Generic drug: insulin degludec SMARTSIG:50 Unit(s) SUB-Q Daily   triamcinolone cream 0.5 % Commonly known as: KENALOG Apply 1 application topically 2 (two) times daily.   Victoza 18 MG/3ML Sopn Generic drug: liraglutide Inject 1.8 mg into the skin daily.   Xigduo XR 11-998 MG Tb24 Generic drug: Dapagliflozin-metFORMIN HCl ER Take 1 tablet by mouth in the morning and at bedtime.         OBJECTIVE:   PHYSICAL EXAM: VS: BP 120/70   Pulse 67   SpO2 99%    EXAM: General: Pt appears well and is in NAD  Eyes: External eye exam normal without stare, lid lag or exophthalmos.  EOM intact.  PERRL.  Neck: General: Supple without adenopathy. Thyroid: Thyroid size normal.  No goiter or nodules appreciated. No thyroid bruit.  Lungs: Clear with good BS bilat with no rales, rhonchi, or wheezes  Heart: Auscultation: RRR.  Abdomen: Normoactive bowel sounds, soft, nontender, without masses or organomegaly palpable  Extremities:  BL LE: Trace  pretibial edema   Mental Status: Judgment, insight: Intact Mood and affect: No depression, anxiety, or agitation     DATA REVIEWED: Results for Carmen, Armstrong (MRN [id number]) as of 04/29/2020 13:00  Ref. Range 04/28/2020 11:41  TSH Latest Ref Range: 0.35 - 4.50 uIU/mL 0.04 (L)  T4,Free(Direct) Latest Ref Range: 0.60 - 1.60 ng/dL 0.68     11/05/2019  Gluc 111 mg/dL BUN/Cr 13/0.52 Alk phos 165 ( 48-121)  AST 20 Alt 22  Results for Carmen, Armstrong (MRN [id number]) as of 01/26/2020 13:34  Ref. Range 12/17/2019 10:24   TRAB Latest Ref Range: <=2.00 IU/L 30.55 (H)    ASSESSMENT / PLAN / RECOMMENDATIONS:   1. Hyperthyroidism Secondary to Graves' Disease:    - Pt is clinically euthyroid  - No local neck symptoms - Tolerating methimazole without side effects  - TFT's stable    Medications   Continue Methimazole 10 mg , 1.5 tabs daily     2. Graves' Disease:   No extrathyroidal manifestations of  Graves' Disease.     F/U in 3 months  Labs in 6 weeks  Signed electronically by: Mack Guise, MD  [facility] Endocrinology  [address] [place], [place] [address] Phone: [phone] FAX: [phone]      CC: [name], [address] [place] [address] Phone: [phone]  Fax: [phone]   Return to Endocrinology clinic as below: Future Appointments  Date Time Provider [place]  04/29/2020 10:00 AM Georges Mouse, PT [facility]  05/04/2020 10:15 AM Izell Warfield, PT [facility]  05/06/2020 11:00 AM Izell Swayzee, PT [facility]  05/11/2020 11:00 AM Izell Oconto, PT [facility]  05/12/2020  1:30 PM Dorothea Ogle, PT [facility]  05/13/2020 11:00 AM Izell Bentley, PT [facility]  05/17/2020 10:00 AM [place], Anderson [place] C, PA-C [facility] None  05/18/2020 10:00 AM Gar Ponto, PT [facility]  05/19/2020  1:30 PM Dorothea Ogle, PT [facility]  05/26/2020  1:30 PM Dorothea Ogle, PT [facility]  05/27/2020 10:00 AM Gar Ponto, PT [facility]  06/09/2020 11:50 AM GI-BCG MM 2 GI-BCGMM GI-BREAST CE

## 2020-04-27 NOTE — Therapy (Signed)
[facility] Outpatient Rehabilitation [facility] [address] [place], [place], [address] Phone: [phone]   Fax:  [phone]  Physical Therapy Treatment  Patient Details  Name: Carmen Armstrong MRN: [id number] Date of Birth: 1965/11/30 Referring Provider (PT): Joycelyn Schmid, MD   Encounter Date: 04/27/2020   PT End of Session - 04/27/20 1020    Visit Number 4    Number of Visits 12    Date for PT Re-Evaluation 05/20/20    Authorization Type Aetna    Authorization Time Period FOTO visit 6, 10    PT Start Time 1015    PT Stop Time 1058    PT Time Calculation (min) 43 min    Activity Tolerance Patient tolerated treatment well;Patient limited by pain    Behavior During Therapy [facility] for tasks assessed/performed           Past Medical History:  Diagnosis Date  . Arrhythmia   . Atrial fibrillation (HCC)   . CAD (coronary artery disease)   . Chronic lower back pain   . Diabetes mellitus without complication (HCC)   . Eczema   . GAD (generalized anxiety disorder)   . H/O vitamin D deficiency   . Heart failure (HCC)   . Hyperlipidemia   . Hypertension   . Insomnia   . PVD (peripheral vascular disease) (HCC)     Past Surgical History:  Procedure Laterality Date  . ANGIOPLASTY     legs  . BACK SURGERY     5 different times  . CARDIAC CATHETERIZATION  2020  . REPLACEMENT TOTAL HIP W/  RESURFACING IMPLANTS Right 2018  . RIGHT AND LEFT HEART CATH    . ROTATOR CUFF REPAIR Right 2016  . SPINE SURGERY  2014,2016,2017,2019  . toe amputated  09/2018   5th left toe    There were no vitals filed for this visit.   Subjective Assessment - 04/27/20 1019    Subjective "I feel a little better today. I got some good rest this weekend". She feels most of her pain when she is walking to the mailbox or around the store.    Pertinent History Fusion L4-S1 Dec 2018, R THA April 2019, diabetic neuropathy, angioplasty B LE due to poor circulation, 5th digit  amputation 09/2019    Currently in Pain? Yes    Pain Score 5     Pain Location Back    Pain Orientation Left;Lower    Pain Descriptors / Indicators Aching    Pain Type Chronic pain    Pain Onset More than a month ago    Pain Onset More than a month ago                             [facility] Adult PT Treatment/Exercise - 04/27/20 0001      Lumbar Exercises: Stretches   Lower Trunk Rotation 5 reps;10 seconds    Figure 4 Stretch 3 reps;20 seconds      Lumbar Exercises: Aerobic   Nustep LE L5 5 min      Lumbar Exercises: Seated   Hip Flexion on Ball Strengthening;Both;5 reps    Hip Flexion on Ball Limitations red stability ball    Other Seated Lumbar Exercises PPT      Lumbar Exercises: Supine   Ab Set 15 reps;5 seconds    AB Set Limitations + stability ball press down    Pelvic Tilt 20 reps    Pelvic Tilt Limitations 2  sets    Other Supine Lumbar Exercises unilateral knee fall outs x5 B                  PT Education - 04/27/20 1323    Education Details aquatics, seated pelvic tilts    Person(s) Educated Patient    Methods Explanation;Verbal cues;Demonstration;Tactile cues    Comprehension Verbalized understanding;Returned demonstration;Verbal cues required;Tactile cues required            PT Short Term Goals - 03/25/20 1459      PT SHORT TERM GOAL #1   Title Pt will be I and compliant with initial HEP.    Baseline given at eval    Time 2    Period Weeks    Status New    Target Date 04/08/20      PT SHORT TERM GOAL #2   Title Pt will decrease pain to </= 10/10 at worst.    Baseline 12/10    Time 3    Period Weeks    Status New    Target Date 04/15/20             PT Long Term Goals - 04/27/20 1325      PT LONG TERM GOAL #1   Title Pt will be independent with long term HEP for improved mobility, strength, and endurance.    Time 8    Period Weeks    Status New      PT LONG TERM GOAL #2   Title Pt will improve lumbar FOTO  ability score to at least 52%, in order to demonstrate significant improvement in perceived level of functional ability.    Baseline 42% ability    Time 8    Period Weeks    Status New      PT LONG TERM GOAL #3   Title Pt will increase lumbar AROM to [facility] without pain, as needed for ADLs and IADLs.    Baseline painful and limited in all planes    Time 8    Period Weeks    Status New      PT LONG TERM GOAL #4   Title Pt will demonstrate negative FABER B, indicating improvement in hip flexiblity and decreased stress to low back.    Baseline positive B    Time 8    Period Weeks    Status New      PT LONG TERM GOAL #5   Title sit to stand from mat x 5 in 30 sec or less with use of UE    Baseline 41 sec    Time 6    Period Weeks    Status New      PT LONG TERM GOAL #6   Title 6 min walk test 500 feet with no rest breaks.    Baseline 390 feet today 1 rest break    Time 6    Period Weeks    Status New                 Plan - 04/27/20 1020    Clinical Impression Statement Pt has been diligent to HEP and attended physical therapy for 4 sessions thus far including eval. She feels she is not making much progress thus far despite her efforts at home and in the clinic. Her pain continues to come on with walking most. Pt states she likes the water, and she would likely make more progress and benefit from a combination of aquatics therapy and [place]  therapy in [place] to improve pain free walking for running errands and for IADLs.    PT Treatment/Interventions ADLs/Self Care Home Management;Cryotherapy;Electrical Stimulation;Moist Heat;Gait training;Functional mobility Network engineer;Therapeutic activities;Therapeutic exercise;Balance training;Neuromuscular re-education;Patient/family education;Manual techniques;Passive range of motion;Dry needling;Taping;Spinal Manipulations;Joint Manipulations;Aquatic Therapy    PT Next Visit Plan Continue stretching /manual/core strength /LE  strength.    PT Home Exercise Plan [id number]    Consulted and Agree with Plan of Care Patient           Patient will benefit from skilled therapeutic intervention in order to improve the following deficits and impairments:  Decreased balance,Postural dysfunction,Pain,Impaired flexibility,Increased fascial restricitons,Decreased strength,Decreased activity tolerance,Decreased range of motion,Impaired perceived functional ability,Improper body mechanics,Hypomobility,Difficulty walking,Decreased endurance  Visit Diagnosis: Chronic left-sided low back pain with left-sided sciatica  Difficulty in walking, not elsewhere classified  Abnormal posture  Muscle weakness (generalized)     Problem List There are no problems to display for this patient.   Marcelline Mates, PT, DPT 04/27/2020, 1:25 PM  [facility] [address] [place], [place], [address] Phone: [phone]   Fax:  [phone]  Name: Alvie Speltz MRN: [id number] Date of Birth: 1965/12/16

## 2020-04-28 ENCOUNTER — Ambulatory Visit: Payer: 59 | Admitting: Internal Medicine

## 2020-04-28 VITALS — BP 120/70 | HR 67 | Ht 69.0 in | Wt 237.2 lb

## 2020-04-28 DIAGNOSIS — E0590 Thyrotoxicosis, unspecified without thyrotoxic crisis or storm: Secondary | ICD-10-CM

## 2020-04-28 DIAGNOSIS — E0500 Thyrotoxicosis with diffuse goiter without thyrotoxic crisis or storm: Secondary | ICD-10-CM | POA: Diagnosis not present

## 2020-04-28 LAB — T4, FREE: Free T4: 0.68 ng/dL (ref 0.60–1.60)

## 2020-04-28 LAB — TSH: TSH: 0.04 u[IU]/mL — ABNORMAL LOW (ref 0.35–4.50)

## 2020-04-28 NOTE — Patient Instructions (Signed)
We recommend that you follow these hyperthyroidism instructions at home:  1) Take Methimazole 10 mg, ONE AND A HALF  tablets daily  a  If you develop severe sore throat with high fevers OR develop unexplained yellowing of your skin, eyes, under your tongue, severe abdominal pain with nausea or vomiting --> then please get evaluated immediately.  3) Get repeat thyroid labs in 8 weeks    It is ESSENTIAL to get follow-up labs to help avoid over or undertreatment of your hyperthyroidism - both of which can be dangerous to your health.

## 2020-04-29 ENCOUNTER — Ambulatory Visit: Payer: 59

## 2020-04-29 ENCOUNTER — Encounter: Payer: Self-pay | Admitting: Internal Medicine

## 2020-04-29 MED ORDER — METHIMAZOLE 10 MG PO TABS: 15.0000 mg | ORAL_TABLET | Freq: Every day | ORAL | 6 refills | Status: DC

## 2020-05-04 ENCOUNTER — Other Ambulatory Visit: Payer: Self-pay

## 2020-05-04 ENCOUNTER — Ambulatory Visit: Payer: 59

## 2020-05-04 DIAGNOSIS — R293 Abnormal posture: Secondary | ICD-10-CM

## 2020-05-04 DIAGNOSIS — R262 Difficulty in walking, not elsewhere classified: Secondary | ICD-10-CM

## 2020-05-04 DIAGNOSIS — G8929 Other chronic pain: Secondary | ICD-10-CM

## 2020-05-04 DIAGNOSIS — M5442 Lumbago with sciatica, left side: Secondary | ICD-10-CM | POA: Diagnosis not present

## 2020-05-04 DIAGNOSIS — M6281 Muscle weakness (generalized): Secondary | ICD-10-CM

## 2020-05-04 NOTE — Therapy (Signed)
[address] [facility] Outpatient Rehabilitation [facility] [address] [place], [place], [address] Phone: [phone]   Fax:  [phone]  Physical Therapy Treatment  Patient Details  Name: Carmen Armstrong MRN: [id number] Date of Birth: September 12, 1965 Referring Provider (PT): Joycelyn Schmid, MD   Encounter Date: 05/04/2020   PT End of Session - 05/04/20 1049    Visit Number 5    Number of Visits 12    Date for PT Re-Evaluation 05/20/20    Authorization Type [employer]    Authorization Time Period FOTO visit 6, 10    PT Start Time 1023   pt arrived late   PT Stop Time 1058    PT Time Calculation (min) 35 min    Activity Tolerance Patient tolerated treatment well;Patient limited by pain    Behavior During Therapy [facility] for tasks assessed/performed           Past Medical History:  Diagnosis Date  . Arrhythmia   . Atrial fibrillation (HCC)   . CAD (coronary artery disease)   . Chronic lower back pain   . Diabetes mellitus without complication (HCC)   . Eczema   . GAD (generalized anxiety disorder)   . H/O vitamin D deficiency   . Heart failure (HCC)   . Hyperlipidemia   . Hypertension   . Insomnia   . PVD (peripheral vascular disease) (HCC)     Past Surgical History:  Procedure Laterality Date  . ANGIOPLASTY     legs  . BACK SURGERY     5 different times  . CARDIAC CATHETERIZATION  2020  . REPLACEMENT TOTAL HIP W/  RESURFACING IMPLANTS Right 2018  . RIGHT AND LEFT HEART CATH    . ROTATOR CUFF REPAIR Right 2016  . SPINE SURGERY  2014,2016,2017,2019  . toe amputated  09/2018   5th left toe    There were no vitals filed for this visit.   Subjective Assessment - 05/04/20 1033    Subjective "I was out for 2 days after last viist. My right hip was burning. I couldn't do the things I wanted, standing up, like cooking, washing dishes." Pt does report her thigh pain was gone and moved up to her L buttock and the low back. She has also stopped wearing the back  brace. Pt is excited about aquatic therapy.    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 Yes    Pain Score 5     Pain Location Back    Pain Orientation Left;Lower    Pain Descriptors / Indicators Aching    Pain Type Chronic pain    Pain Radiating Towards across to R back    Pain Onset More than a month ago    Pain Onset More than a month ago                             [facility] Adult PT Treatment/Exercise - 05/04/20 0001      Lumbar Exercises: Stretches   Lower Trunk Rotation 5 reps;10 seconds    Piriformis Stretch 2 reps;30 seconds;Right    Piriformis Stretch Limitations unable to tolerate L    Figure 4 Stretch 2 reps;30 seconds      Lumbar Exercises: Aerobic   Nustep LE L5 5 min      Lumbar Exercises: Supine   Ab Set 10  reps;5 seconds   exhale to squeeze ball and draw TA in, inhale to take a deep breath   AB Set Limitations ball squeeze    Pelvic Tilt 10 reps;5 seconds    Pelvic Tilt Limitations ball squeeze    Clam 10 reps;5 seconds    Clam Limitations red band      Lumbar Exercises: Sidelying   Other Sidelying Lumbar Exercises R S/L L thoracic rotation with knees bent and HBH                  PT Education - 05/04/20 1056    Education Details add L thoracic rotation to program, only L for a couple of days to make sure no incr in s/s before adding R    Person(s) Educated Patient    Methods Explanation;Tactile cues;Verbal cues;Demonstration;Handout    Comprehension Verbalized understanding;Returned demonstration;Verbal cues required            PT Short Term Goals - 03/25/20 1459      PT SHORT TERM GOAL #1   Title Pt will be I and compliant with initial HEP.    Baseline given at eval    Time 2    Period Weeks    Status New    Target Date 04/08/20      PT SHORT TERM GOAL #2   Title Pt will decrease pain to  </= 10/10 at worst.    Baseline 12/10    Time 3    Period Weeks    Status New    Target Date 04/15/20             PT Long Term Goals - 04/27/20 1325      PT LONG TERM GOAL #1   Title Pt will be independent with long term HEP for improved mobility, strength, and endurance.    Time 8    Period Weeks    Status New      PT LONG TERM GOAL #2   Title Pt will improve lumbar FOTO ability score to at least 52%, in order to demonstrate significant improvement in perceived level of functional ability.    Baseline 42% ability    Time 8    Period Weeks    Status New      PT LONG TERM GOAL #3   Title Pt will increase lumbar AROM to [facility] without pain, as needed for ADLs and IADLs.    Baseline painful and limited in all planes    Time 8    Period Weeks    Status New      PT LONG TERM GOAL #4   Title Pt will demonstrate negative FABER B, indicating improvement in hip flexiblity and decreased stress to low back.    Baseline positive B    Time 8    Period Weeks    Status New      PT LONG TERM GOAL #5   Title sit to stand from mat x 5 in 30 sec or less with use of UE    Baseline 41 sec    Time 6    Period Weeks    Status New      PT LONG TERM GOAL #6   Title 6 min walk test 500 feet with no rest breaks.    Baseline 390 feet today 1 rest break    Time 6    Period Weeks    Status New  Plan - 05/04/20 1044    Clinical Impression Statement Pt reported inability to stand for I/ADLs last visit, so focused more on mobility in spine and setting glutes/core. Able to tolerate knee to opposite shoulder piriformis stretch on R LE, but not on L today reporting groin pain - will continue monitor. Pt continues to have difficulty with inner core activation, likely contributing to back pain in prolonged standing and walking.    PT Treatment/Interventions ADLs/Self Care Home Management;Cryotherapy;Electrical Stimulation;Moist Heat;Gait training;Functional mobility  Network engineer;Therapeutic activities;Therapeutic exercise;Balance training;Neuromuscular re-education;Patient/family education;Manual techniques;Passive range of motion;Dry needling;Taping;Spinal Manipulations;Joint Manipulations;Aquatic Therapy    PT Next Visit Plan Continue to focus on inner core activation, endurance and strength; LE strength; spinal mobility and strength; endurance    PT Home Exercise Plan [email or web address]    Consulted and Agree with Plan of Care Patient           Patient will benefit from skilled therapeutic intervention in order to improve the following deficits and impairments:  Decreased balance,Postural dysfunction,Pain,Impaired flexibility,Increased fascial restricitons,Decreased strength,Decreased activity tolerance,Decreased range of motion,Impaired perceived functional ability,Improper body mechanics,Hypomobility,Difficulty walking,Decreased endurance  Visit Diagnosis: Chronic left-sided low back pain with left-sided sciatica  Difficulty in walking, not elsewhere classified  Abnormal posture  Muscle weakness (generalized)     Problem List There are no problems to display for this patient.   Marcelline Mates, PT, DPT 05/04/2020, 11:40 AM  [facility] [address] [place], [place], [address] Phone: [phone]   Fax:  [phone]  Name: Carmen Armstrong MRN: [id number] Date of Birth: March 15, 1965

## 2020-05-06 ENCOUNTER — Other Ambulatory Visit: Payer: Self-pay

## 2020-05-06 ENCOUNTER — Ambulatory Visit: Payer: 59

## 2020-05-06 DIAGNOSIS — G8929 Other chronic pain: Secondary | ICD-10-CM

## 2020-05-06 DIAGNOSIS — M5442 Lumbago with sciatica, left side: Secondary | ICD-10-CM | POA: Diagnosis not present

## 2020-05-06 DIAGNOSIS — R293 Abnormal posture: Secondary | ICD-10-CM

## 2020-05-06 DIAGNOSIS — R262 Difficulty in walking, not elsewhere classified: Secondary | ICD-10-CM

## 2020-05-06 DIAGNOSIS — M6281 Muscle weakness (generalized): Secondary | ICD-10-CM

## 2020-05-06 NOTE — Therapy (Signed)
[facility] Outpatient Rehabilitation [facility] [address] [place], [place], [address] Phone: [phone]   Fax:  [phone]  Physical Therapy Treatment  Patient Details  Name: Carmen Armstrong MRN: [id number] Date of Birth: 02/15/1965 Referring Provider (PT): Joycelyn Schmid, MD   Encounter Date: 05/06/2020   PT End of Session - 05/06/20 1100    Visit Number 6    Number of Visits 12    Date for PT Re-Evaluation 05/20/20    Authorization Type [employer]et[employer]    Authorization Time Period FOTO visit 6, 10    PT Start Time 1100    PT Stop Time 1142    PT Time Calculation (min) 42 min    Equipment Utilized During Treatment Back brace;Other (comment)   SPC   Activity Tolerance Patient tolerated treatment well;Patient limited by pain    Behavior During Therapy [facility] for tasks assessed/performed           Past Medical History:  Diagnosis Date  . Arrhythmia   . Atrial fibrillation (HCC)   . CAD (coronary artery disease)   . Chronic lower back pain   . Diabetes mellitus without complication (HCC)   . Eczema   . GAD (generalized anxiety disorder)   . H/O vitamin D deficiency   . Heart failure (HCC)   . Hyperlipidemia   . Hypertension   . Insomnia   . PVD (peripheral vascular disease) (HCC)     Past Surgical History:  Procedure Laterality Date  . ANGIOPLASTY     legs  . BACK SURGERY     5 different times  . CARDIAC CATHETERIZATION  2020  . REPLACEMENT TOTAL HIP W/  RESURFACING IMPLANTS Right 2018  . RIGHT AND LEFT HEART CATH    . ROTATOR CUFF REPAIR Right 2016  . SPINE SURGERY  2014,2016,2017,2019  . toe amputated  09/2018   5th left toe    There were no vitals filed for this visit.   Subjective Assessment - 05/06/20 1100    Subjective Pt reports she still has pain with walking and it hasn't gotten better. Her left leg still locks up from the glute down when she is walking with or without her back brace.    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 Yes    Pain Score 5     Pain Location Back    Pain Orientation Left;Lower    Pain Descriptors / Indicators Aching    Pain Type Chronic pain    Pain Onset More than a month ago    Pain Score 3    Pain Location Hip    Pain Orientation Right    Pain Descriptors / Indicators Tingling    Pain Type Chronic pain    Pain Onset More than a month ago                             [facility] Adult PT Treatment/Exercise - 05/06/20 0001      Self-Care   Other Self-Care Comments  see pt edu      Lumbar Exercises: Stretches   Other Lumbar Stretch Exercise prone lying with 2 pillows under hips (3 min static, manual therapy otherwise)   reduced to 1 pillow after 10 min (static 3 min, manual therapy), no pillow for ~10 minutes with exercise  Lumbar Exercises: Aerobic   Nustep UE/LE L5 5 min      Lumbar Exercises: Sidelying   Other Sidelying Lumbar Exercises R S/L L thoracic rotation with knees bent and HBH      Lumbar Exercises: Prone   Other Prone Lumbar Exercises glute sets, TrA activation   some L groin tightness started after glute sets (dissipated with pressure to R PSIS and TrA activation)     Manual Therapy   Manual Therapy Myofascial release;Joint mobilization    Joint Mobilization ribcage rocking, L lumbar side glides grade 3, PAs to thoracic spine    Myofascial Release MFR spreading spinal extensors T10-L1                  PT Education - 05/06/20 1136    Education Details edu to try prone lying at home on bed with 1-2 pillows, progressing to 0    Person(s) Educated Patient    Methods Explanation;Demonstration;Verbal cues    Comprehension Verbalized understanding;Returned demonstration            PT Short Term Goals - 03/25/20 1459      PT SHORT TERM GOAL #1   Title Pt will be I and compliant with initial HEP.    Baseline  given at eval    Time 2    Period Weeks    Status New    Target Date 04/08/20      PT SHORT TERM GOAL #2   Title Pt will decrease pain to </= 10/10 at worst.    Baseline 12/10    Time 3    Period Weeks    Status New    Target Date 04/15/20             PT Long Term Goals - 04/27/20 1325      PT LONG TERM GOAL #1   Title Pt will be independent with long term HEP for improved mobility, strength, and endurance.    Time 8    Period Weeks    Status New      PT LONG TERM GOAL #2   Title Pt will improve lumbar FOTO ability score to at least 52%, in order to demonstrate significant improvement in perceived level of functional ability.    Baseline 42% ability    Time 8    Period Weeks    Status New      PT LONG TERM GOAL #3   Title Pt will increase lumbar AROM to [facility] without pain, as needed for ADLs and IADLs.    Baseline painful and limited in all planes    Time 8    Period Weeks    Status New      PT LONG TERM GOAL #4   Title Pt will demonstrate negative FABER B, indicating improvement in hip flexiblity and decreased stress to low back.    Baseline positive B    Time 8    Period Weeks    Status New      PT LONG TERM GOAL #5   Title sit to stand from mat x 5 in 30 sec or less with use of UE    Baseline 41 sec    Time 6    Period Weeks    Status New      PT LONG TERM GOAL #6   Title 6 min walk test 500 feet with no rest breaks.    Baseline 390 feet today 1 rest break    Time 6  Period Weeks    Status New                 Plan - 05/06/20 1100    Clinical Impression Statement Pt presents with no change in pain today, so spent session trying prone lying reducing from 2 pillows to 1 pillow. Pt reported feeling comfortable with still prone lying and good with TrA activation. She did have some L groin referral from glute sets. Finished session with R sidelying and L thoracic rotation to reduce pressure to L low back.    PT Treatment/Interventions ADLs/Self  Care Home Management;Cryotherapy;Electrical Stimulation;Moist Heat;Gait training;Functional mobility Network engineer;Therapeutic activities;Therapeutic exercise;Balance training;Neuromuscular re-education;Patient/family education;Manual techniques;Passive range of motion;Dry needling;Taping;Spinal Manipulations;Joint Manipulations;Aquatic Therapy    PT Next Visit Plan Aquatics; Continue to focus on inner core activation, endurance and strength; LE strength; spinal mobility and strength; endurance    PT Home Exercise Plan [email or web address]    Consulted and Agree with Plan of Care Patient           Patient will benefit from skilled therapeutic intervention in order to improve the following deficits and impairments:  Decreased balance,Postural dysfunction,Pain,Impaired flexibility,Increased fascial restricitons,Decreased strength,Decreased activity tolerance,Decreased range of motion,Impaired perceived functional ability,Improper body mechanics,Hypomobility,Difficulty walking,Decreased endurance  Visit Diagnosis: Chronic left-sided low back pain with left-sided sciatica  Difficulty in walking, not elsewhere classified  Abnormal posture  Muscle weakness (generalized)     Problem List There are no problems to display for this patient.   Marcelline Mates, PT, DPT 05/06/2020, 11:41 AM  [facility] [address] [place], [place], [address] Phone: [phone]   Fax:  [phone]  Name: Carmen Armstrong MRN: [id number] Date of Birth: 25-Jun-1965

## 2020-05-12 ENCOUNTER — Other Ambulatory Visit: Payer: Self-pay

## 2020-05-12 ENCOUNTER — Ambulatory Visit: Payer: 59 | Admitting: Physical Therapy

## 2020-05-12 ENCOUNTER — Encounter: Payer: Self-pay | Admitting: Physical Therapy

## 2020-05-12 DIAGNOSIS — R262 Difficulty in walking, not elsewhere classified: Secondary | ICD-10-CM

## 2020-05-12 DIAGNOSIS — R293 Abnormal posture: Secondary | ICD-10-CM

## 2020-05-12 DIAGNOSIS — M6281 Muscle weakness (generalized): Secondary | ICD-10-CM

## 2020-05-12 DIAGNOSIS — M5442 Lumbago with sciatica, left side: Secondary | ICD-10-CM | POA: Diagnosis not present

## 2020-05-12 DIAGNOSIS — G8929 Other chronic pain: Secondary | ICD-10-CM

## 2020-05-12 NOTE — Therapy (Signed)
[facility] [address] [place], [place], [address] Phone: [phone]   Fax:  [phone]  Physical Therapy Treatment  Patient Details  Name: Carmen Armstrong MRN: [id number] Date of Birth: 1965-05-02 Referring Provider (PT): Joycelyn Schmid, MD   Encounter Date: 05/12/2020   PT End of Session - 05/12/20 1638    Visit Number 7    Number of Visits 12    Date for PT Re-Evaluation 05/20/20    Authorization Type Aetna    Authorization Time Period FOTO visit 6, 10    PT Start Time 1332    PT Stop Time 1414    PT Time Calculation (min) 42 min    Activity Tolerance Patient tolerated treatment well    Behavior During Therapy [facility] for tasks assessed/performed           Past Medical History:  Diagnosis Date  . Arrhythmia   . Atrial fibrillation (HCC)   . CAD (coronary artery disease)   . Chronic lower back pain   . Diabetes mellitus without complication (HCC)   . Eczema   . GAD (generalized anxiety disorder)   . H/O vitamin D deficiency   . Heart failure (HCC)   . Hyperlipidemia   . Hypertension   . Insomnia   . PVD (peripheral vascular disease) (HCC)     Past Surgical History:  Procedure Laterality Date  . ANGIOPLASTY     legs  . BACK SURGERY     5 different times  . CARDIAC CATHETERIZATION  2020  . REPLACEMENT TOTAL HIP W/  RESURFACING IMPLANTS Right 2018  . RIGHT AND LEFT HEART CATH    . ROTATOR CUFF REPAIR Right 2016  . SPINE SURGERY  2014,2016,2017,2019  . toe amputated  09/2018   5th left toe    There were no vitals filed for this visit.   Subjective Assessment - 05/12/20 1635    Subjective I have done some water cardio classes before.   this is my first time at [facility].   My pain in my left back is a 5/10.  I just find it hard to do exercises in the clinic.  I was so worn out the other day I took some medicine and went to bed. 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 Small left foraminal disc protrusion at L1-2, closely  approximating and potentially irritating the exiting left L1 nerve  root.  2. Suspected additional small left subarticular disc extrusion with  inferior migration at L1-2, which could potentially affect the  descending left L2 nerve root and contribute to left-sided symptoms.  Finding somewhat difficult to be certain of given adjacent  susceptibility artifact.  3. Prior PLIF at L2-3 through L4-5 without residual or recurrent  stenosis.  4. Severe left worse than right facet degeneration at L5-S1 without  stenosis.    Patient Stated Goals Figure out where her pain is coming from and get rid of it once and for all    Currently in Pain? Yes    Pain Score 5     Pain Location Back    Pain Orientation Left;Lower    Pain Descriptors / Indicators Aching    Pain Type Chronic pain  Aquatic therapy at [facility] [address] - therapeutic pool temp 89  degrees Pt enters building without AD.  Treatment took place in water 3.0 to 4.8.feet deep depending upon activity.  Pt entered and exited the pool via stair and handrails with supervision.   Pt was introduced to aquatic therapy and principles of water and therapeutic benefits.  Pt entered with 5/10 pain and reduced to 1-2/10 after aquatics While educating, pt is walking back and forth pool width 8 times  15 ft, Pt also marching walk 15 ft x 2  Pt shown Runners stretch while holding onto rails and bending R knee while stretching L hip flexor for 30 sec then in hamstring stretch while holding onto rails for 30 sec.   Then bending L knee while stretching R hip flexor for 30 sec then in hamstring stretch while holding on to rails for  30 sec   Pt was made aware of neutral posture and strategies for maintaining in water using points on posterior spine on pool wall and engaging abdominal muscles while resisting flotation weights/ noodle under water.  Practiced sitting and hip hinging with water submerged to mid chest with mild pulling x 10 Then using wall as external cue ,hip hinging in standing x 10 Cat camel with pool noodle Deep squats on edge of pool x 10 maintaining upright / neutral posture with VC/TC  On edge of pool with bil UE support  Pt performed LE exercise with PT providing VC and TC Hip abd/add R/L 15 x each and then using 1 UE support Hip ext/flex with knee straight x 15,  Marching knee/hip 90/90 x 15 Heel raise x 10  Use of kick board submerged to engage abdominals. And then rotating to Left and Right to utilized drag of water for resistance and engagement of core    Bad Ragaz, Pt with lumbar belt around hips and nek doodle for neck support.  .  Pt assisted into supine floating position by lying head on shoulder of PT to get into floating position. PT at torso and assisting with trunk left to right and vice versa to engage trunk muscles. PT then rotated trunk in order to engage abdomnal (internal and external obliques)  Emphasis on breathing techniques to draw in abdominals for support.  Pt then utilizing posterior chain and engaging Hip extension and knee flexion with water resistance.  Pt received Aqua Stretch technique for left low back and Quadratus lumborum     Pt requires the buoyancy of water for active assisted exercises with buoyancy supported for strengthening & ROM exercises: pt requires the viscosity of the water for resistance with strengthening exercises Water allow for ambulating quickly without fear of falling in water and increasing stride length Water will allow for reduced gait deviation due to reduced joint loading through buoyancy to help patient improve posture without excess stress and  pain.  Water current provides perturbations which challenge standing balance unsupported Pt is unable to tolerate land due to pain or inability to move freely on land without pain and substitution/compensations in gait.                         PT Education - 05/12/20 1637    Education Details Education on Aquatics and properties of water beneficial to pt, working on HEP in which she will use on her own at local pool    [employer]) Educated Patient    Methods Explanation;Demonstration    Comprehension  Verbalized understanding;Returned demonstration            PT Short Term Goals - 03/25/20 1459      PT SHORT TERM GOAL #1   Title Pt will be I and compliant with initial HEP.    Baseline given at eval    Time 2    Period Weeks    Status New    Target Date 04/08/20      PT SHORT TERM GOAL #2   Title Pt will decrease pain to </= 10/10 at worst.    Baseline 12/10    Time 3    Period Weeks    Status New    Target Date 04/15/20             PT Long Term Goals - 04/27/20 1325      PT LONG TERM GOAL #1   Title Pt will be independent with long term HEP for improved mobility, strength, and endurance.    Time 8    Period Weeks    Status New      PT LONG TERM GOAL #2   Title Pt will improve lumbar FOTO ability score to at least 52%, in order to demonstrate significant improvement in perceived level of functional ability.    Baseline 42% ability    Time 8    Period Weeks    Status New      PT LONG TERM GOAL #3   Title Pt will increase lumbar AROM to [facility] without pain, as needed for ADLs and IADLs.    Baseline painful and limited in all planes    Time 8    Period Weeks    Status New      PT LONG TERM GOAL #4   Title Pt will demonstrate negative FABER B, indicating improvement in hip flexiblity and decreased stress to low back.    Baseline positive B    Time 8    Period Weeks    Status New      PT LONG TERM GOAL #5   Title sit to stand from mat x 5  in 30 sec or less with use of UE    Baseline 41 sec    Time 6    Period Weeks    Status New      PT LONG TERM GOAL #6   Title 6 min walk test 500 feet with no rest breaks.    Baseline 390 feet today 1 rest break    Time 6    Period Weeks    Status New                 Plan - 05/12/20 1640    Clinical Impression Statement Ms Dell enters pool slowly and cautiously.  Pt was able to ambulate in the water without deviations apparent on land.  Pt noted her 5/10 pain reduced with water and pt explained deptth of water decreased joint pressure so she is able to walk with a normal stride length.  Water allows for ambulating quickly without fear of falling in water and increasing stride length  Water will allow for reduced gait deviation due to reduced joint loading through buoyancy to help patient improve posture without excess stress and pain.  will continue for a couple of sessions in order to provide an HEP for a local community pool.    Personal Factors and Comorbidities Comorbidity 3+;Time since onset of injury/illness/exacerbation;Past/Current Experience;Fitness    Comorbidities Grave's Disease, Diabetic Neuropathy, DM, HLD,  HTN    Examination-Activity Limitations Transfers;Sit;Lift;Squat;Bend;Stand;Stairs;Locomotion Level    Examination-Participation Restrictions Shop;Community Activity;Cleaning;Laundry    PT Duration 8 weeks    PT Treatment/Interventions ADLs/Self Care Home Management;Cryotherapy;Electrical Stimulation;Moist Heat;Gait training;Functional mobility Network engineertraining;Stair training;Therapeutic activities;Therapeutic exercise;Balance training;Neuromuscular re-education;Patient/family education;Manual techniques;Passive range of motion;Dry needling;Taping;Spinal Manipulations;Joint Manipulations;Aquatic Therapy    PT Next Visit Plan Aquatics; Continue to focus on inner core activation, endurance and strength; LE strength; spinal mobility and strength; endurance    PT Home  Exercise Plan [id number]    Consulted and Agree with Plan of Care Patient           Patient will benefit from skilled therapeutic intervention in order to improve the following deficits and impairments:  Decreased balance,Postural dysfunction,Pain,Impaired flexibility,Increased fascial restricitons,Decreased strength,Decreased activity tolerance,Decreased range of motion,Impaired perceived functional ability,Improper body mechanics,Hypomobility,Difficulty walking,Decreased endurance  Visit Diagnosis: Chronic left-sided low back pain with left-sided sciatica  Difficulty in walking, not elsewhere classified  Abnormal posture  Muscle weakness (generalized)     Problem List There are no problems to display for this patient.   Garen LahLawrie Kalani Sthilaire, PT, [facility] Certified Exercise Expert for the Aging Adult  05/12/20 4:53 PM Phone: [phone] Fax: [phone]  [facility] [address] [place], [place], [address] Phone: [phone]   Fax:  [phone]  Name: Carmen Armstrong MRN: [id number] Date of Birth: 02-05-1966

## 2020-05-13 ENCOUNTER — Ambulatory Visit: Payer: 59

## 2020-05-13 DIAGNOSIS — R262 Difficulty in walking, not elsewhere classified: Secondary | ICD-10-CM

## 2020-05-13 DIAGNOSIS — R293 Abnormal posture: Secondary | ICD-10-CM

## 2020-05-13 DIAGNOSIS — G8929 Other chronic pain: Secondary | ICD-10-CM

## 2020-05-13 DIAGNOSIS — M5442 Lumbago with sciatica, left side: Secondary | ICD-10-CM | POA: Diagnosis not present

## 2020-05-13 DIAGNOSIS — M6281 Muscle weakness (generalized): Secondary | ICD-10-CM

## 2020-05-13 NOTE — Therapy (Signed)
[facility] [address] [place], [place], [address] Phone: [phone]   Fax:  [phone]  Physical Therapy Treatment  Patient Details  Name: Carmen Armstrong MRN: [id number] Date of Birth: 1965/09/21 Referring Provider (PT): Joycelyn Schmid, MD   Encounter Date: 05/13/2020   PT End of Session - 05/13/20 1111    Visit Number 8    Number of Visits 12    Date for PT Re-Evaluation 05/20/20    Authorization Type Aetna    Authorization Time Period FOTO visit 6, 10    PT Start Time 1102    PT Stop Time 1145    PT Time Calculation (min) 43 min    Activity Tolerance Patient tolerated treatment well    Behavior During Therapy [facility] for tasks assessed/performed           Past Medical History:  Diagnosis Date  . Arrhythmia   . Atrial fibrillation (HCC)   . CAD (coronary artery disease)   . Chronic lower back pain   . Diabetes mellitus without complication (HCC)   . Eczema   . GAD (generalized anxiety disorder)   . H/O vitamin D deficiency   . Heart failure (HCC)   . Hyperlipidemia   . Hypertension   . Insomnia   . PVD (peripheral vascular disease) (HCC)     Past Surgical History:  Procedure Laterality Date  . ANGIOPLASTY     legs  . BACK SURGERY     5 different times  . CARDIAC CATHETERIZATION  2020  . REPLACEMENT TOTAL HIP W/  RESURFACING IMPLANTS Right 2018  . RIGHT AND LEFT HEART CATH    . ROTATOR CUFF REPAIR Right 2016  . SPINE SURGERY  2014,2016,2017,2019  . toe amputated  09/2018   5th left toe    There were no vitals filed for this visit.   Subjective Assessment - 05/13/20 1110    Subjective Pt reports she liked Aquatics yesterday and felt great in the water, but pain returns out of water.    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 Small left foraminal disc protrusion at L1-2, closely  approximating and potentially irritating the exiting left L1 nerve  root.  2. Suspected additional small left subarticular disc extrusion with  inferior migration at L1-2, which could potentially affect the  descending left L2 nerve root and contribute to left-sided symptoms.  Finding somewhat difficult to be certain of given adjacent  susceptibility artifact.  3. Prior PLIF at L2-3 through L4-5 without residual or recurrent  stenosis.  4. Severe left worse than right facet degeneration at L5-S1 without  stenosis.    Patient Stated Goals Figure out where her pain is coming from and get rid of it once and for all    Currently in Pain? Yes    Pain Score 5     Pain Location Back    Pain Orientation Left;Lower    Pain Descriptors / Indicators Aching                             [facility] Adult PT Treatment/Exercise - 05/13/20 0001      Self-Care   Other Self-Care Comments  see pt edu  Lumbar Exercises: Aerobic   Nustep UE/LE L5 5 min      Lumbar Exercises: Standing   Shoulder Extension 10 reps;Theraband    Theraband Level (Shoulder Extension) Level 1 (Yellow)    Shoulder Extension Limitations doorway    Other Standing Lumbar Exercises Wall Posture/angels   with pink FR --> next visit     Lumbar Exercises: Seated   Long Arc Quad on Chair 2 sets;10 reps    LAQ on Chair Weights (lbs) 3    Sit to Stand 5 reps    Sit to Stand Limitations 2 sets with 2000 gram ball, seated on airex on low mat table   Verbal and tactile cues for upright posture to right hips under shoulders and upward gaze to ball for improved overall erect posture   Other Seated Lumbar Exercises D2 flexion in sitting, seated bang twist with horizontal abduction                  PT Education - 05/13/20 1148    Education Details Heavy education on  PNE, pt receptive and already practicing at end of session - verbalizing it felt better to stand up straight overall, that it hurts now but she knows it will feel better later.    Person(s) Educated Patient    Methods Explanation    Comprehension Verbalized understanding            PT Short Term Goals - 03/25/20 1459      PT SHORT TERM GOAL #1   Title Pt will be I and compliant with initial HEP.    Baseline given at eval    Time 2    Period Weeks    Status New    Target Date 04/08/20      PT SHORT TERM GOAL #2   Title Pt will decrease pain to </= 10/10 at worst.    Baseline 12/10    Time 3    Period Weeks    Status New    Target Date 04/15/20             PT Long Term Goals - 04/27/20 1325      PT LONG TERM GOAL #1   Title Pt will be independent with long term HEP for improved mobility, strength, and endurance.    Time 8    Period Weeks    Status New      PT LONG TERM GOAL #2   Title Pt will improve lumbar FOTO ability score to at least 52%, in order to demonstrate significant improvement in perceived level of functional ability.    Baseline 42% ability    Time 8    Period Weeks    Status New      PT LONG TERM GOAL #3   Title Pt will increase lumbar AROM to [facility] without pain, as needed for ADLs and IADLs.    Baseline painful and limited in all planes    Time 8    Period Weeks    Status New      PT LONG TERM GOAL #4   Title Pt will demonstrate negative FABER B, indicating improvement in hip flexiblity and decreased stress to low back.    Baseline positive B    Time 8    Period Weeks    Status New      PT LONG TERM GOAL #5   Title sit to stand from mat x 5 in 30 sec or less with use of UE  Baseline 41 sec    Time 6    Period Weeks    Status New      PT LONG TERM GOAL #6   Title 6 min walk test 500 feet with no rest breaks.    Baseline 390 feet today 1 rest break    Time 6    Period Weeks    Status New                 Plan - 05/13/20  1111    Clinical Impression Statement Pt presents with continued report of 5/10 pain in her back and no relief after Aquatic therapy, just afterward. Provided extensive PNE today, and pt was very receptive, verbalizing back at end of session. She had good tolerance for session focused on posture, lengthening, and rotation. Will reassess next visit for ability to progress.    Personal Factors and Comorbidities Comorbidity 3+;Time since onset of injury/illness/exacerbation;Past/Current Experience;Fitness    Comorbidities Grave's Disease, Diabetic Neuropathy, DM, HLD, HTN    Examination-Activity Limitations Transfers;Sit;Lift;Squat;Bend;Stand;Stairs;Locomotion Level    Examination-Participation Restrictions Shop;Community Activity;Cleaning;Laundry    PT Duration 8 weeks    PT Treatment/Interventions ADLs/Self Care Home Management;Cryotherapy;Electrical Stimulation;Moist Heat;Gait training;Functional mobility Network engineer;Therapeutic activities;Therapeutic exercise;Balance training;Neuromuscular re-education;Patient/family education;Manual techniques;Passive range of motion;Dry needling;Taping;Spinal Manipulations;Joint Manipulations;Aquatic Therapy    PT Next Visit Plan Aquatics; Continue to focus on inner core activation, endurance and strength; LE strength; spinal mobility and strength; endurance    PT Home Exercise Plan [email or web address]    Consulted and Agree with Plan of Care Patient           Patient will benefit from skilled therapeutic intervention in order to improve the following deficits and impairments:  Decreased balance,Postural dysfunction,Pain,Impaired flexibility,Increased fascial restricitons,Decreased strength,Decreased activity tolerance,Decreased range of motion,Impaired perceived functional ability,Improper body mechanics,Hypomobility,Difficulty walking,Decreased endurance  Visit Diagnosis: Chronic left-sided low back pain with left-sided sciatica  Difficulty in walking, not  elsewhere classified  Abnormal posture  Muscle weakness (generalized)     Problem List There are no problems to display for this patient.   Marcelline Mates, PT, DPT 05/13/2020, 1:26 PM  [facility] [address] [place], [place], [address] Phone: [phone]   Fax:  [phone]  Name: Valentina Alcoser MRN: [id number] Date of Birth: Nov 26, 1965

## 2020-05-14 NOTE — Progress Notes (Signed)
Primary Physician/Referring:  [name], MD  Patient ID: Carmen Armstrong, female    DOB: 12-24-1965, 55 y.o.   MRN: [id number]  Chief Complaint  Patient presents with  . Asymptomatic bilateral carotid artery stenosis  . Follow-up    8 week   HPI:    Carmen Armstrong  is a 55 y.o. with hypertension, hyperlipidemia, type 2 DM, PAD, depression. History of ablation for atrial fibrillation in February and April 2021. She had ulcer of her left fifth toe, with worsening gangrene despite revascularization of right and left lower extremities, required amputation.   Patient has chronic, stable neuropathy in her feet bilaterally, without claudication, nonhealing ulcers or wounds. She is presently on Eliquis and aspirin. She has been unable to tolerate Plavix in the past due to rash.   Patient presents for 8-week follow-up of hypertension and results of cardiac testing.  Her last office visit increased spironolactone from 12.5 mg to 25 mg daily, unfortunately she did not have repeat BMP done.  Patient reports she is planning to have blood work done later this week, and therefore will obtain repeat BMP.  She is tolerating increased dose of spironolactone without issue.  She reports home blood pressure readings are well controlled averaging 125/70 mmHg.  She was seen by her internal medicine provider on 04/28/2020, and her blood pressure was under excellent control at 120/70 mmHg.  Notably patient is in significant back pain at this time which is causing her severe discomfort and pain.  She is following with PCP for further management.  Patient denies chest pain, dyspnea, leg edema, orthopnea, PND, palpitations, syncope, near syncope.  She continues to take furosemide 20 mg daily and is tolerating Eliquis without bleeding diathesis.   Past Medical History:  Diagnosis Date  . Arrhythmia   . Atrial fibrillation ([place])   . CAD (coronary artery disease)   . Chronic lower back pain   .  Diabetes mellitus without complication ([place])   . Eczema   . GAD (generalized anxiety disorder)   . H/O vitamin D deficiency   . Heart failure ([place])   . Hyperlipidemia   . Hypertension   . Insomnia   . PVD (peripheral vascular disease) ([place])    Past Surgical History:  Procedure Laterality Date  . ANGIOPLASTY     legs  . BACK SURGERY     5 different times  . CARDIAC CATHETERIZATION  2020  . REPLACEMENT TOTAL HIP W/  RESURFACING IMPLANTS Right 2018  . RIGHT AND LEFT HEART CATH    . ROTATOR CUFF REPAIR Right 2016  . SPINE SURGERY  2014,2016,2017,2019  . toe amputated  09/2018   5th left toe   Family History  Problem Relation Age of Onset  . Hypertension Mother   . Hyperlipidemia Mother   . Diabetes Mother   . Stroke Mother   . Clotting disorder Father   . Hypertension Father   . Hyperlipidemia Father   . Hyperlipidemia Sister   . Diabetes Brother   . Neuropathy Brother     Social History   Tobacco Use  . Smoking status: Current Every Day Smoker    Packs/day: 0.50    Years: 30.00    Pack years: 15.00    Types: Cigarettes  . Smokeless tobacco: Never Used  Substance Use Topics  . Alcohol use: Not Currently   Marital Status: Widowed   ROS  Review of Systems  Constitutional: Negative for malaise/fatigue and weight gain.  Cardiovascular: Negative for chest pain,  claudication, leg swelling, near-syncope, orthopnea, palpitations, paroxysmal nocturnal dyspnea and syncope.  Respiratory: Negative for shortness of breath.   Hematologic/Lymphatic: Does not bruise/bleed easily.  Musculoskeletal: Positive for back pain and joint pain.  Gastrointestinal: Negative for melena.  Neurological: Positive for paresthesias (Bilateral feet). Negative for dizziness and weakness.    Objective  Blood pressure (!) 146/74, pulse 88, temperature 98.4 F (36.9 C), resp. rate 15, height _0  (1.753 m), weight 247 lb (112 kg), SpO2 97 %.  Vitals with BMI 05/17/2020 04/28/2020 03/22/2020   Height _1  _2  _3   Weight 247 lbs 237 lbs 4 oz 239 lbs 13 oz  BMI 36.46 98.33 82.5  Systolic 053 976 734  Diastolic 74 70 75  Pulse 88 67 86      Physical Exam Vitals reviewed.  Constitutional:      Appearance: She is obese.  HENT:     Head: Normocephalic and atraumatic.  Neck:     Vascular: Carotid bruit (right > left ) present. No JVD.  Cardiovascular:     Rate and Rhythm: Normal rate and regular rhythm.     Pulses: Intact distal pulses.          Femoral pulses are 2+ on the right side and 2+ on the left side.      Popliteal pulses are 1+ on the right side and 1+ on the left side.       Dorsalis pedis pulses are 0 on the right side and 0 on the left side.       Posterior tibial pulses are 1+ on the right side and 0 on the left side.     Heart sounds: S1 normal and S2 normal. Murmur heard.   Harsh midsystolic murmur is present at the upper right sternal border radiating to the neck. No gallop.      Comments: Weak/absent pedal pulses, but well perfused warm feet without any open ulcers, wounds, gangrene. Well-healed scar at left fifth toe s/p amputation Pulmonary:     Effort: Pulmonary effort is normal. No respiratory distress.     Breath sounds: No wheezing, rhonchi or rales.  Musculoskeletal:     Right lower leg: No edema.     Left lower leg: No edema.  Skin:    General: Skin is warm and dry.     Capillary Refill: Capillary refill takes less than 2 seconds.     Findings: No erythema, lesion or rash.  Neurological:     Mental Status: She is alert.    Laboratory examination:   Recent Labs    12/17/19 1024  NA 138  K 4.6  CL 103  CO2 28  GLUCOSE 132*  BUN 9  CREATININE 0.74  CALCIUM 9.4   CrCl cannot be calculated (Patient's most recent lab result is older than the maximum 21 days allowed.).  CMP Latest Ref Rng & Units 12/17/2019  Glucose 70 - 99 mg/dL 132(H)  BUN 6 - 23 mg/dL 9  Creatinine 0.40 - 1.20 mg/dL 0.74  Sodium 135 - 145 mEq/L 138   Potassium 3.5 - 5.1 mEq/L 4.6  Chloride 96 - 112 mEq/L 103  CO2 19 - 32 mEq/L 28  Calcium 8.4 - 10.5 mg/dL 9.4  Total Protein 6.0 - 8.3 g/dL 7.8  Total Bilirubin 0.2 - 1.2 mg/dL 0.4  Alkaline Phos 39 - 117 U/L 174(H)  AST 0 - 37 U/L 13  ALT 0 - 35 U/L 12   CBC Latest Ref Rng & Units  12/17/2019  WBC 4.0 - 10.5 K/uL 6.5  Hemoglobin 12.0 - 15.0 g/dL 12.8  Hematocrit 36.0 - 46.0 % 40.6  Platelets 150.0 - 400.0 K/uL 236.0    Lipid Panel No results for input(s): CHOL, TRIG, LDLCALC, VLDL, HDL, CHOLHDL, LDLDIRECT in the last 8760 hours.  HEMOGLOBIN A1C No results found for: HGBA1C, MPG TSH Recent Labs    01/26/20 1411 03/10/20 1107 04/28/20 1141  TSH 0.02* <0.01 Repeated and verified X2.* 0.04*    External labs:  01/27/2020: Hemoglobin 12.4, hematocrit 39.9, MCV 82, platelets 222 Total cholesterol 83, triglycerides 68, HDL 32, LDL 36 Glucose 92, BUN 23, creatinine 1.07, GFR 59, sodium 141, potassium 4.5, alk phos 178, CMP otherwise normal TSH 0.022  Medications and allergies   Allergies  Allergen Reactions  . Other     Hazelnuts- SOB  . Penicillin G     whelps  . Trulicity [Dulaglutide]     Large itchy bumps     Outpatient Medications Prior to Visit  Medication Sig Dispense Refill  . ALPRAZolam (XANAX) 0.5 MG tablet Take by mouth at bedtime.    Marland Kitchen aspirin EC 81 MG tablet Take 81 mg by mouth daily. Swallow whole.    Marland Kitchen atorvastatin (LIPITOR) 40 MG tablet Take 40 mg by mouth daily.    . baclofen (LIORESAL) 10 MG tablet Take 10 mg by mouth 3 (three) times daily as needed.    . candesartan (ATACAND) 4 MG tablet Take 4 mg by mouth daily.    Marland Kitchen ELIQUIS 5 MG TABS tablet Take 5 mg by mouth 2 (two) times daily.    . furosemide (LASIX) 20 MG tablet Take 20 mg by mouth daily.    . methimazole (TAPAZOLE) 10 MG tablet Take 1.5 tablets (15 mg total) by mouth daily. 45 tablet 6  . metoprolol succinate (TOPROL-XL) 100 MG 24 hr tablet Take 100 mg by mouth daily.    . MULTAQ 400 MG  tablet Take 400 mg by mouth 2 (two) times daily.    Marland Kitchen oxyCODONE (OXY IR/ROXICODONE) 5 MG immediate release tablet Take 5 mg by mouth 4 (four) times daily as needed.    . pregabalin (LYRICA) 200 MG capsule Take 200 mg by mouth 3 (three) times daily.    . sertraline (ZOLOFT) 100 MG tablet Take 100 mg by mouth daily.    Marland Kitchen spironolactone (ALDACTONE) 25 MG tablet Take 1 tablet (25 mg total) by mouth daily. 30 tablet 3  . TRESIBA FLEXTOUCH 100 UNIT/ML FlexTouch Pen SMARTSIG:50 Unit(s) SUB-Q Daily    . triamcinolone cream (KENALOG) 0.5 % Apply 1 application topically 2 (two) times daily.    Marland Kitchen VICTOZA 18 MG/3ML SOPN Inject 1.8 mg into the skin daily.    Marland Kitchen XIGDUO XR 11-998 MG TB24 Take 1 tablet by mouth in the morning and at bedtime.      No facility-administered medications prior to visit.     Radiology:   No results found.  Cardiac Studies:   Carotid artery duplex April 13, 2020:  Stenosis in the right internal carotid  artery (minimal). Stenosis in the right external carotid artery (<50%).  Stenosis in the left internal carotid artery (50-69%). Stenosis in the left external carotid artery (<50%).  Antegrade right vertebral artery flow. Antegrade left vertebral artery flow.  Follow up in six months is appropriate if clinically indicated.  ABI 09/05/2019:  This exam reveals mildly decreased perfusion of the right lower extremity, noted at the post tibial artery level with monophasic waveform(ABI 0.96).  The right anterior tibial artery shows dampened monophasic waveform suggestive of severe diffuse disease or occlusion.  This exam reveals mildly decreased perfusion of the left lower extremity, noted at the anterior tibial and post tibial artery level (ABI 0.96).   EKG:   EKG 03/22/2020: Sinus rhythm at a rate of 82 bpm, left atrial enlargement.  Normal axis.  Cannot exclude anteroseptal infarct old.  Low voltage complexes.  EKG 09/02/2019: Sinus rhythm 81 bpm. Cannot exclude old anteroseptal  infarct.  Assessment     ICD-10-CM   1. Asymptomatic bilateral carotid artery stenosis  I65.23   2. Essential hypertension  I10   3. PVD (peripheral vascular disease) (H[facility])  I73.9   4. Coronary artery disease involving native coronary artery of native heart without angina pectoris  I25.10      There are no discontinued medications.  No orders of the defined types were placed in this encounter.   Recommendations:   Carmen Armstrong is a 55 y.o.  with hypertension, hyperlipidemia, type 2 DM, PAD, depression. History of ablation for atrial fibrillation in February and April 2021. She had ulcer of her left fifth toe, with worsening gangrene despite revascularization of right and left lower extremities, required amputation.   Patient presents for 8-week follow-up of hypertension and results of cardiac testing.  Her last office visit increase spironolactone from 12.5 mg to 25 mg daily. She is tolerating increased dose of spironolactone without issue. She will obtain repeat BMP later this week. Although her blood pressure is elevated in the office today, it is well controlled at home. Will not make changes to her antihypertensive medications at this time.   Patient reports echocardiogram and heart catheterization were done in the last 1-2 years in [place]. Will again attempt to obtain records.   Reviewed and discussed with patient regarding results of carotid artery duplex which showed bilateral stenosis, will plan to repeat carotid duplex in 6 months.  Continue aspirin and Lipitor.  PAD: No lifestyle limiting claudication, or critical limb ischemia. Suspect feet pain is due to underlying neuropathy ABI revealed mildly decreased perfusion of the left lower extremity, noted at the anterior tibial and post tibial artery level (ABI 0.96)  Continue aspirin, Lipitor Continue management of hypertension, hyperlipidemia, diabetes, and tobacco use  Patient unable to tolerate Plavix due to  rash   Paroxysmal A. Fib: History of prior ablations.   Maintaining sinus rhythm. CHA2DS2VASc score 4, annual stroke risk 5%. Tolerating anticoagulation without bleeding diathesis  Continue Eliquis 5 mg twice daily  Hypertension: Elevated in the office, well controlled at home.  Continue candesartan, metoprolol succinate, spironolactone, and furosemide. Repeat BMP later this week.   Type 2 diabetes mellitus: Most recent A1c 6.7%.   Will defer further management of diabetes to PCP  Bilateral carotid artery stenosis:  Will repeat carotid duplex in 6 months Continue Asprin and Lipitor   Follow up in 6 months, sooner if needed, for hypertension, pAD, and carotid stenosis.    Alethia Berthold, PA-C 05/17/2020, 1:07 PM Office: [phone]

## 2020-05-17 ENCOUNTER — Encounter: Payer: Self-pay | Admitting: Student

## 2020-05-17 ENCOUNTER — Ambulatory Visit: Payer: 59 | Admitting: Student

## 2020-05-17 ENCOUNTER — Other Ambulatory Visit: Payer: Self-pay

## 2020-05-17 VITALS — BP 146/74 | HR 88 | Temp 98.4°F | Resp 15 | Ht 69.0 in | Wt 247.0 lb

## 2020-05-17 DIAGNOSIS — I6523 Occlusion and stenosis of bilateral carotid arteries: Secondary | ICD-10-CM

## 2020-05-17 DIAGNOSIS — I739 Peripheral vascular disease, unspecified: Secondary | ICD-10-CM

## 2020-05-17 DIAGNOSIS — I10 Essential (primary) hypertension: Secondary | ICD-10-CM

## 2020-05-17 DIAGNOSIS — I2510 Atherosclerotic heart disease of native coronary artery without angina pectoris: Secondary | ICD-10-CM

## 2020-05-18 ENCOUNTER — Ambulatory Visit: Payer: 59

## 2020-05-19 ENCOUNTER — Ambulatory Visit: Payer: 59 | Admitting: Physical Therapy

## 2020-05-26 ENCOUNTER — Ambulatory Visit: Payer: 59 | Admitting: Physical Therapy

## 2020-05-27 ENCOUNTER — Ambulatory Visit: Payer: 59 | Attending: Diagnostic Neuroimaging

## 2020-05-27 ENCOUNTER — Other Ambulatory Visit: Payer: Self-pay

## 2020-05-27 DIAGNOSIS — G8929 Other chronic pain: Secondary | ICD-10-CM | POA: Insufficient documentation

## 2020-05-27 DIAGNOSIS — R293 Abnormal posture: Secondary | ICD-10-CM | POA: Insufficient documentation

## 2020-05-27 DIAGNOSIS — M6281 Muscle weakness (generalized): Secondary | ICD-10-CM | POA: Diagnosis present

## 2020-05-27 DIAGNOSIS — M5442 Lumbago with sciatica, left side: Secondary | ICD-10-CM | POA: Insufficient documentation

## 2020-05-27 DIAGNOSIS — R262 Difficulty in walking, not elsewhere classified: Secondary | ICD-10-CM | POA: Insufficient documentation

## 2020-05-28 NOTE — Therapy (Signed)
[facility] [address] [place], [place], [address] Phone: [phone]   Fax:  [phone]  Physical Therapy Treatment/Re-Cert  Patient Details  Name: Carmen Armstrong MRN: [id number] Date of Birth: Feb 04, 1966 Referring Provider (PT): Joycelyn Schmid, MD   Encounter Date: 05/27/2020   PT End of Session - 05/28/20 1247    Visit Number 9    Number of Visits 12    Date for PT Re-Evaluation 05/20/20    Authorization Type [employer]    Authorization Time Period FOTO visit 6, 10    PT Start Time 1006    PT Stop Time 1051    PT Time Calculation (min) 45 min    Equipment Utilized During Treatment Back brace;Other (comment)    Activity Tolerance Patient tolerated treatment well    Behavior During Therapy [facility] for tasks assessed/performed           Past Medical History:  Diagnosis Date  . Arrhythmia   . Atrial fibrillation ([facility])   . CAD (coronary artery disease)   . Chronic lower back pain   . Diabetes mellitus without complication ([facility])   . Eczema   . GAD (generalized anxiety disorder)   . H/O vitamin D deficiency   . Heart failure ([facility])   . Hyperlipidemia   . Hypertension   . Insomnia   . PVD (peripheral vascular disease) ([facility])     Past Surgical History:  Procedure Laterality Date  . ANGIOPLASTY     legs  . BACK SURGERY     5 different times  . CARDIAC CATHETERIZATION  2020  . REPLACEMENT TOTAL HIP W/  RESURFACING IMPLANTS Right 2018  . RIGHT AND LEFT HEART CATH    . ROTATOR CUFF REPAIR Right 2016  . SPINE SURGERY  2014,2016,2017,2019  . toe amputated  09/2018   5th left toe    There were no vitals filed for this visit.   Subjective Assessment - 05/27/20 1013    Subjective Pt reports she is having back pain of 5/10. Pt reports she likes aquatic therapy and is is able to tolerate movement in water better. She is wanting to have an aquatics HEP so she can continue with exs at a local community pool.     Limitations Standing;Walking;House hold activities;Lifting    Diagnostic tests L hip XR: mild degenerative changes to L hip; Lumbar MRI: 1. Small left foraminal disc protrusion at L1-2, closely  approximating and potentially irritating the exiting left L1 nerve  root.  2. Suspected additional small left subarticular disc extrusion with  inferior migration at L1-2, which could potentially affect the  descending left L2 nerve root and contribute to left-sided symptoms.  Finding somewhat difficult to be certain of given adjacent  susceptibility artifact.  3. Prior PLIF at L2-3 through L4-5 without residual or recurrent  stenosis.  4. Severe left worse than right facet degeneration at L5-S1 without  stenosis.    Patient Stated Goals Figure out where her pain is coming from and get rid of it once and for all    Currently in Pain? Yes    Pain Score 5     Pain Location Back    Pain Orientation Left;Lower    Pain Descriptors / Indicators Aching    Pain Type Chronic pain    Pain Onset More than a month ago    Pain Frequency Constant    Aggravating Factors  generally all activity    Pain Score 3    Pain Location  Hip    Pain Orientation Right    Pain Descriptors / Indicators Tingling    Pain Type Chronic pain    Pain Onset More than a month ago    Pain Frequency Intermittent              [facility] PT Assessment - 05/28/20 0001      Balance   Balance Assessed Yes      Standardized Balance Assessment   Standardized Balance Assessment Timed Up and Go Test      Timed Up and Go Test   Normal TUG (seconds) 25.5                         [facility] Adult PT Treatment/Exercise - 05/28/20 0001      Exercises   Exercises Lumbar;Knee/Hip      Lumbar Exercises: Aerobic   Nustep UE/LE L5 5 min      Lumbar Exercises: Seated   Long Arc Quad on Chair 2 sets;10 reps    LAQ on Chair Weights (lbs) 3    Sit to Stand 5 reps      Lumbar Exercises: Supine   Bent Knee Raise 10 reps    Bent Knee  Raise Limitations marching c core engaged    Other Supine Lumbar Exercises PPT with hand reaches toward ankle/sligth shoulder lifts 10x    Other Supine Lumbar Exercises Shoulder add 10x2 c reb Tband                    PT Short Term Goals - 05/27/20 1020      PT SHORT TERM GOAL #1   Title Pt will be I and compliant with initial HEP.    Status Achieved    Target Date 05/27/20      PT SHORT TERM GOAL #2   Title Pt will decrease pain to </= 10/10 at worst.    Baseline 12/10    Status Achieved    Target Date 05/27/20             PT Long Term Goals - 05/27/20 1036      PT LONG TERM GOAL #1   Title Pt will be independent with long term HEP for improved mobility, strength, and endurance.    Time 8    Period Weeks    Status On-going    Target Date 07/17/20      PT LONG TERM GOAL #2   Title Pt will improve lumbar FOTO ability score to at least 52%, in order to demonstrate significant improvement in perceived level of functional ability. 05/27/20- 40% ability    Baseline 42% ability    Status On-going    Target Date 07/17/20      PT LONG TERM GOAL #3   Title Pt will increase lumbar AROM to [facility] without pain, as needed for ADLs and IADLs.    Baseline painful and limited in all planes    Status On-going    Target Date 07/17/20      PT LONG TERM GOAL #4   Title Pt will demonstrate negative FABER B, indicating improvement in hip flexiblity and decreased stress to low back.    Baseline positive B    Status On-going    Target Date 07/17/20      PT LONG TERM GOAL #5   Title sit to stand from mat x 5 in 30 sec or less with use of UE. 05/27/20; 25.5 sec c use of  hands    Baseline 41 sec    Status Achieved    Target Date 05/27/20      PT LONG TERM GOAL #6   Title 6 min walk test 500 feet with no rest breaks.    Baseline 390 feet today 1 rest break    Status On-going    Target Date 07/17/20                 Plan - 05/28/20 1248    Clinical Impression Statement  Pt is still experiencing chronic low back pain, but subjective report indicates improvement, and with the 5xSTS test, strength and functional mobility are improving. Pt liked her 1st aquatics therapy session with her being able to be active in the water. She would like to continue with the aquatics therapy so she can have a HEP for use in her own community pool. PT will benefit from PT 1w6 to continue with aquatics therapy in addition to traditional PT for lumbopelvic flexibility and strengthening/stability to optimize her functional mobility and quality of life.    Personal Factors and Comorbidities Comorbidity 3+;Time since onset of injury/illness/exacerbation;Past/Current Experience;Fitness    Comorbidities Grave's Disease, Diabetic Neuropathy, DM, HLD, HTN    Examination-Activity Limitations Transfers;Sit;Lift;Squat;Bend;Stand;Stairs;Locomotion Level    Examination-Participation Restrictions Shop;Community Activity;Cleaning;Laundry    Stability/Clinical Decision Making Evolving/Moderate complexity    Clinical Decision Making Moderate    Rehab Potential Good    PT Frequency 1x / week    PT Duration 6 weeks    PT Treatment/Interventions ADLs/Self Care Home Management;Cryotherapy;Electrical Stimulation;Moist Heat;Gait training;Functional mobility Network engineer;Therapeutic activities;Therapeutic exercise;Balance training;Neuromuscular re-education;Patient/family education;Manual techniques;Passive range of motion;Dry needling;Taping;Spinal Manipulations;Joint Manipulations;Aquatic Therapy    PT Next Visit Plan Aquatics; Continue to focus on inner core activation, endurance and strength; LE strength; spinal mobility and strength; endurance    PT Home Exercise Plan [id number]    Consulted and Agree with Plan of Care Patient           Patient will benefit from skilled therapeutic intervention in order to improve the following deficits and impairments:  Decreased balance,Postural  dysfunction,Pain,Impaired flexibility,Increased fascial restricitons,Decreased strength,Decreased activity tolerance,Decreased range of motion,Impaired perceived functional ability,Improper body mechanics,Hypomobility,Difficulty walking,Decreased endurance  Visit Diagnosis: Chronic left-sided low back pain with left-sided sciatica  Difficulty in walking, not elsewhere classified  Abnormal posture  Muscle weakness (generalized)     Problem List There are no problems to display for this patient.   Joellyn Rued MS, PT 05/28/20 1:20 PM  [facility] [address] [place], [place], [address] Phone: [phone]   Fax:  [phone]  Name: Makyla Bye MRN: [id number] Date of Birth: 1965-12-22

## 2020-06-02 ENCOUNTER — Ambulatory Visit: Payer: 59 | Admitting: Physical Therapy

## 2020-06-02 ENCOUNTER — Encounter: Payer: Self-pay | Admitting: Physical Therapy

## 2020-06-02 ENCOUNTER — Other Ambulatory Visit: Payer: Self-pay

## 2020-06-02 DIAGNOSIS — R293 Abnormal posture: Secondary | ICD-10-CM

## 2020-06-02 DIAGNOSIS — M5442 Lumbago with sciatica, left side: Secondary | ICD-10-CM | POA: Diagnosis not present

## 2020-06-02 DIAGNOSIS — M6281 Muscle weakness (generalized): Secondary | ICD-10-CM

## 2020-06-02 DIAGNOSIS — G8929 Other chronic pain: Secondary | ICD-10-CM

## 2020-06-02 DIAGNOSIS — R262 Difficulty in walking, not elsewhere classified: Secondary | ICD-10-CM

## 2020-06-02 NOTE — Therapy (Signed)
[facility] [employer] [address] [place], [place], [address] Phone: [phone]   Fax:  [phone]  Physical Therapy Treatment  Patient Details  Name: Carmen Armstrong MRN: [id number] Date of Birth: 09/09/1965 Referring Provider (PT): Joycelyn Schmid, MD   Encounter Date: 06/02/2020   PT End of Session - 06/02/20 1635    Visit Number 10    Number of Visits 12    Date for PT Re-Evaluation 05/20/20    Authorization Type Aetna    Authorization Time Period FOTO visit 6, 10    PT Start Time 1345   arrived 93 miin late   PT Stop Time 1415    PT Time Calculation (min) 30 min    Activity Tolerance Patient tolerated treatment well    Behavior During Therapy [facility] for tasks assessed/performed           Past Medical History:  Diagnosis Date  . Arrhythmia   . Atrial fibrillation (HCC)   . CAD (coronary artery disease)   . Chronic lower back pain   . Diabetes mellitus without complication (HCC)   . Eczema   . GAD (generalized anxiety disorder)   . H/O vitamin D deficiency   . Heart failure (HCC)   . Hyperlipidemia   . Hypertension   . Insomnia   . PVD (peripheral vascular disease) (HCC)     Past Surgical History:  Procedure Laterality Date  . ANGIOPLASTY     legs  . BACK SURGERY     5 different times  . CARDIAC CATHETERIZATION  2020  . REPLACEMENT TOTAL HIP W/  RESURFACING IMPLANTS Right 2018  . RIGHT AND LEFT HEART CATH    . ROTATOR CUFF REPAIR Right 2016  . SPINE SURGERY  2014,2016,2017,2019  . toe amputated  09/2018   5th left toe    There were no vitals filed for this visit.   Subjective Assessment - 06/02/20 1633    Subjective I am so sorry that I am late.  I had some problems getting here but I am here now.  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 Small left foraminal disc protrusion at L1-2, closely  approximating and potentially irritating the exiting left L1 nerve  root.  2. Suspected additional small left subarticular disc extrusion with  inferior migration at L1-2, which could potentially affect the  descending left L2 nerve root and contribute to left-sided symptoms.  Finding somewhat difficult to be certain of given adjacent  susceptibility artifact.  3. Prior PLIF at L2-3 through L4-5 without residual or recurrent  stenosis.  4. Severe left worse than right facet degeneration at L5-S1 without  stenosis.    Pain Score 5     Pain Location Back    Pain Orientation Left;Lower    Pain Descriptors / Indicators Aching    Pain Type Chronic pain    Pain Onset More than a month ago    Pain Frequency Constant    Pain Score 5    Pain Location Hip    Pain Orientation Right    Pain Descriptors / Indicators Tingling    Pain Type Chronic pain  Aquatic therapy at [facility] [address] - therapeutic pool temp 88-90  degrees Pt enters building without AD.  Treatment took place in water 3.8 to  4 ft 8 in.feet deep depending upon activity.  Pt entered and exited the pool via stair and handrails independently.  Carly Reiman SPT and Geni Bers PT observed session.   Carmen Armstrong entered water for aquatic therapy for first time and was introduced to principles and therapeutic effects of water as she ambulated and acclimated to pool. Because she arrived late, PT spent time with diaphragmatic breathing to calm pt as she rushed into pool.    She was able to ambulate across pool 15 ft x 4, then side steps x 2 and then ambulates backward x 2.    She was able to use aquatic barbells while ambulating to increase abdominal engagement with exercises.   Pt educated on neutral posture and hip hinging in  seated position with water at chest level x 10 with stretch to low back and then x 10 with back at pool wall at external cue, VC for neck tucked to prevent hyperextension. Practiced sitting and hip hinging with water submerged to mid chest with no pain x 10 Then using wall as external cue ,hip hinging in standing x 10  Runners stretch  2 x Left and then moves into hamstring stretch  Then runners stretch on R and then move into hamstring stretch . TC to insure proper technique.     Carmen Armstrong  using cuff weights to LE for added resistance in water for exercises. And VC for being mindful or engaged core and neutral pelvis.   On edge of pool with bil UE support  Pt performed LE exercise  Hip abd/add R/L 10 x each and then using 1 UE support Hip ext/flex with knee straight x 20, pt needing VC and TC for correct execution and sequencing  Marching knee/hip 90/90 x 10 and then with added pool noodle for increased resistance x 10 each leg  ham curl R/L x 20.  Hips at 90 degree flexion to 90 degrees abduction for IR/ER AROM of R and then L hip with chest deep water.  No pain with any movements in water Squats x 20 reps with intermittent UE support x 2 sets. Lunge to target x 10 on R and the x 10 on LE Cat/ camel with pool noodle  Balance in water using waist circles and PT adding turbulence in water to aid with spinal muscle engagement  With SLS on R and L LE for 3 minutes  Bad Ragaz, Pt with lumbar belt around hips and nek doodle for neck support.  .  Pt assisted into supine floating position by lying head on shoulder of PT to get into floating position. . Pt was able to continue.  PT at torso and assisting with trunk left to right and vice versa to engage trunk muscles. PT then rotated trunk in order to engage abdomnal (internal and external obliques)  Emphasis on breathing techniques to draw in abdominals for support.  Pt then utilizing posterior chain and engaging Hip extension and knee flexion  with water resistance while PT used Aquastretch techniques to decrease muscle tension in low back on Left gluteals and Left low back then worked on R gluteal and L low back        Pt requires the buoyancy of water for active assisted exercises with buoyancy supported for strengthening & ROM exercises: PT  requires  the viscosity of the water for resistance with strengthening exercises Hydrostatic pressure also supports joints by unweighting joint load by at least 50 % in 3-4 feet depth water. 80% in chest to neck deep water. Water will allow for  reduced joint loading through buoyancy to help patient improve posture without excess stress and pain.  Water current provides perturbations which challenge standing balance unsupported                         PT Education - 06/02/20 1634    Education Details Education on Aquatics and therapeutic effects of water with buoyancy, hydrostatic pressure.  Acclimating to water environment and deep breathing    Person(s) Educated Patient    Methods Explanation;Demonstration;Tactile cues;Verbal cues    Comprehension Verbalized understanding;Returned demonstration            PT Short Term Goals - 05/27/20 1020      PT SHORT TERM GOAL #1   Title Pt will be I and compliant with initial HEP.    Status Achieved    Target Date 05/27/20      PT SHORT TERM GOAL #2   Title Pt will decrease pain to </= 10/10 at worst.    Baseline 12/10    Status Achieved    Target Date 05/27/20             PT Long Term Goals - 05/27/20 1036      PT LONG TERM GOAL #1   Title Pt will be independent with long term HEP for improved mobility, strength, and endurance.    Time 8    Period Weeks    Status On-going    Target Date 07/17/20      PT LONG TERM GOAL #2   Title Pt will improve lumbar FOTO ability score to at least 52%, in order to demonstrate significant improvement in perceived level of functional ability. 05/27/20- 40% ability     Baseline 42% ability    Status On-going    Target Date 07/17/20      PT LONG TERM GOAL #3   Title Pt will increase lumbar [facility]OM to [facility] pain, as needed for ADLs and IADLs.    Baseline painful and limited in all planes    Status On-going    Target Date 07/17/20      PT LONG TERM GOAL #4   Title Pt will demonstrate negative FABER B, indicating improvement in hip flexiblity and decreased stress to low back.    Baseline positive B    Status On-going    Target Date 07/17/20      PT LONG TERM GOAL #5   Title sit to stand from mat x 5 in 30 sec or less with use of UE. 05/27/20; 25.5 sec c use of hands    Baseline 41 sec    Status Achieved    Target Date 05/27/20      PT LONG TERM GOAL #6   Title 6 min walk test 500 feet with no rest breaks.    Baseline 390 feet today 1 rest break    Status On-going    Target Date 07/17/20                 Plan - 06/02/20 1636    Clinical Impression Statement Carmen Carmen DuckingBynum- Jacksion arrives at pool 15 minutes later and flustered and out of breath.  PT spent initial time on educating on diaphragmatic breatthing while pt acclimiated  by walking in water forward, backward and side stepping.   Pt with 5/10 pain in back and hip but reported zero pain after 30 minutes session in water.   Pt will work on developing an HEP for use at her local pool.  Hydrostatic pressure also supports joints by unweighting joint load by at least 50 % in 3-4 feet depth water. 80% in chest to neck deep water.  Water will allow for  reduced joint loading through buoyancy to help patient improve posture without excess stress and pain.    Personal Factors and Comorbidities Comorbidity 3+;Time since onset of injury/illness/exacerbation;Past/Current Experience;Fitness    Comorbidities Grave's Disease, Diabetic Neuropathy, DM, HLD, HTN    Examination-Activity Limitations Transfers;Sit;Lift;Squat;Bend;Stand;Stairs;Locomotion Level    Examination-Participation Restrictions  Shop;Community Activity;Cleaning;Laundry    PT Frequency 1x / week    PT Duration 6 weeks    PT Treatment/Interventions ADLs/Self Care Home Management;Cryotherapy;Electrical Stimulation;Moist Heat;Gait training;Functional mobility Network engineer;Therapeutic activities;Therapeutic exercise;Balance training;Neuromuscular re-education;Patient/family education;Manual techniques;Passive range of motion;Dry needling;Taping;Spinal Manipulations;Joint Manipulations;Aquatic Therapy    PT Next Visit Plan Will need FOTO at first land visit after aquatics visits. Continue to focus on inner core activation, endurance and strength; LE strength; spinal mobility and strength; endurance/    PT Home Exercise Plan [email or web address]    Consulted and Agree with Plan of Care Patient           Patient will benefit from skilled therapeutic intervention in order to improve the following deficits and impairments:  Decreased balance,Postural dysfunction,Pain,Impaired flexibility,Increased fascial restricitons,Decreased strength,Decreased activity tolerance,Decreased range of motion,Impaired perceived functional ability,Improper body mechanics,Hypomobility,Difficulty walking,Decreased endurance  Visit Diagnosis: Chronic left-sided low back pain with left-sided sciatica  Difficulty in walking, not elsewhere classified  Abnormal posture  Muscle weakness (generalized)     Problem List There are no problems to display for this patient.   Garen Lah, PT, ATRIC Certified Exercise Expert for the Aging Adult  06/02/20 4:53 PM Phone: [phone] Fax: [phone]  [facility] Outpatient Rehabilitation [facility] [address] [place], [place], [address] Phone: [phone]   Fax:  [phone]  Name: Carmen Armstrong MRN: [id number] Date of Birth: Jan 31, 1966

## 2020-06-03 ENCOUNTER — Ambulatory Visit: Payer: 59

## 2020-06-03 DIAGNOSIS — I6523 Occlusion and stenosis of bilateral carotid arteries: Secondary | ICD-10-CM

## 2020-06-09 ENCOUNTER — Ambulatory Visit: Payer: 59 | Admitting: Physical Therapy

## 2020-06-09 ENCOUNTER — Ambulatory Visit
Admission: RE | Admit: 2020-06-09 | Discharge: 2020-06-09 | Disposition: A | Discharge status: Home / Self Care | Payer: 59 | Source: Ambulatory Visit | Attending: Family Medicine | Admitting: Family Medicine

## 2020-06-09 ENCOUNTER — Other Ambulatory Visit: Payer: Self-pay

## 2020-06-09 DIAGNOSIS — Z1231 Encounter for screening mammogram for malignant neoplasm of breast: Secondary | ICD-10-CM

## 2020-06-18 ENCOUNTER — Ambulatory Visit: Payer: 59

## 2020-06-23 ENCOUNTER — Ambulatory Visit: Payer: 59 | Attending: Diagnostic Neuroimaging

## 2020-06-23 ENCOUNTER — Other Ambulatory Visit: Payer: Self-pay

## 2020-06-23 ENCOUNTER — Other Ambulatory Visit (INDEPENDENT_AMBULATORY_CARE_PROVIDER_SITE_OTHER): Payer: 59

## 2020-06-23 DIAGNOSIS — G8929 Other chronic pain: Secondary | ICD-10-CM | POA: Insufficient documentation

## 2020-06-23 DIAGNOSIS — M6281 Muscle weakness (generalized): Secondary | ICD-10-CM | POA: Insufficient documentation

## 2020-06-23 DIAGNOSIS — R262 Difficulty in walking, not elsewhere classified: Secondary | ICD-10-CM | POA: Insufficient documentation

## 2020-06-23 DIAGNOSIS — M5442 Lumbago with sciatica, left side: Secondary | ICD-10-CM | POA: Insufficient documentation

## 2020-06-23 DIAGNOSIS — E0590 Thyrotoxicosis, unspecified without thyrotoxic crisis or storm: Secondary | ICD-10-CM

## 2020-06-23 DIAGNOSIS — R293 Abnormal posture: Secondary | ICD-10-CM | POA: Diagnosis present

## 2020-06-23 LAB — TSH: TSH: 0.08 u[IU]/mL — ABNORMAL LOW (ref 0.35–4.50)

## 2020-06-23 LAB — T4, FREE: Free T4: 0.74 ng/dL (ref 0.60–1.60)

## 2020-06-23 NOTE — Therapy (Signed)
[facility] Outpatient Rehabilitation [facility] [address] [place], [place], [address] Phone: [phone]   Fax:  [phone]  Physical Therapy Treatment  Patient Details  Name: Carmen Armstrong MRN: [id number] Date of Birth: 1965-09-13 Referring Provider (PT): Joycelyn Schmid, MD   Encounter Date: 06/23/2020   PT End of Session - 06/23/20 1009    Visit Number 11    Number of Visits 16    Date for PT Re-Evaluation 05/20/20    Authorization Type Aetna    Authorization Time Period FOTO 16 visit    PT Start Time 1005    PT Stop Time 1050    PT Time Calculation (min) 45 min    Equipment Utilized During Treatment Other (comment)   SPC   Activity Tolerance Patient tolerated treatment well    Behavior During Therapy [facility] for tasks assessed/performed           Past Medical History:  Diagnosis Date  . Arrhythmia   . Atrial fibrillation (HCC)   . CAD (coronary artery disease)   . Chronic lower back pain   . Diabetes mellitus without complication (HCC)   . Eczema   . GAD (generalized anxiety disorder)   . H/O vitamin D deficiency   . Heart failure (HCC)   . Hyperlipidemia   . Hypertension   . Insomnia   . PVD (peripheral vascular disease) (HCC)     Past Surgical History:  Procedure Laterality Date  . ANGIOPLASTY     legs  . BACK SURGERY     5 different times  . CARDIAC CATHETERIZATION  2020  . REPLACEMENT TOTAL HIP W/  RESURFACING IMPLANTS Right 2018  . RIGHT AND LEFT HEART CATH    . ROTATOR CUFF REPAIR Right 2016  . SPINE SURGERY  2014,2016,2017,2019  . toe amputated  09/2018   5th left toe    There were no vitals filed for this visit.   Subjective Assessment - 06/23/20 1017    Subjective pt reports she is enjoying the aquatics therapy. She reports she is having a good day today. I manage my activity.    Pertinent History Fusion L4-S1 Dec 2018, R THA April 2019, diabetic neuropathy, angioplasty B LE due to poor circulation, 5th digit  amputation 09/2019    Diagnostic tests L hip XR: mild degenerative changes to L hip; Lumbar MRI: 1. Small left foraminal disc protrusion at L1-2, closely  approximating and potentially irritating the exiting left L1 nerve  root.  2. Suspected additional small left subarticular disc extrusion with  inferior migration at L1-2, which could potentially affect the  descending left L2 nerve root and contribute to left-sided symptoms.  Finding somewhat difficult to be certain of given adjacent  susceptibility artifact.  3. Prior PLIF at L2-3 through L4-5 without residual or recurrent  stenosis.  4. Severe left worse than right facet degeneration at L5-S1 without  stenosis.    Patient Stated Goals Figure out where her pain is coming from and get rid of it once and for all    Currently in Pain? Yes    Pain Score 4     Pain Location Back    Pain Orientation Left;Lower    Pain Descriptors / Indicators Aching    Pain Type Chronic pain    Pain Onset More than a month ago    Pain Frequency Constant    Aggravating Factors  generally all activity    Pain Relieving Factors meds , lying down.    Pain Score  4    Pain Location Hip    Pain Orientation Right    Pain Descriptors / Indicators Aching    Pain Type Chronic pain    Pain Onset More than a month ago    Pain Frequency Intermittent              [facility] PT Assessment - 06/23/20 0001      Observation/Other Assessments   Focus on Therapeutic Outcomes (FOTO)  33%                         [facility] Adult PT Treatment/Exercise - 06/23/20 0001      Exercises   Exercises Lumbar;Knee/Hip      Lumbar Exercises: Aerobic   Nustep UE/LE L6 5 min      Lumbar Exercises: Seated   Sit to Stand 5 reps   from mat table at 21 inch height; VC for proper tech     Lumbar Exercises: Supine   Pelvic Tilt 10 reps    Pelvic Tilt Limitations with slight hand reaches toward knees to engage core    Bent Knee Raise 10 reps    Bent Knee Raise Limitations  marching c core engaged    Bridge 10 reps    Bridge Limitations able to just clear buttock    Other Supine Lumbar Exercises hip add sets c ball sqeeze; 10x; 3 sec    Other Supine Lumbar Exercises Hip clams; 15x, blue band                    PT Short Term Goals - 05/27/20 1020      PT SHORT TERM GOAL #1   Title Pt will be I and compliant with initial HEP.    Status Achieved    Target Date 05/27/20      PT SHORT TERM GOAL #2   Title Pt will decrease pain to </= 10/10 at worst.    Baseline 12/10    Status Achieved    Target Date 05/27/20             PT Long Term Goals - 06/23/20 1103      PT LONG TERM GOAL #2   Title Pt will improve lumbar FOTO ability score to at least 52%, in order to demonstrate significant improvement in perceived level of functional ability. 05/27/20- 40% ability. 06/23/20:33%    Baseline 42% ability    Status On-going    Target Date 07/17/20                 Plan - 06/23/20 1020    Clinical Impression Statement Pt participated in PT to improve lumbopelvic strength and activity tolerance. Pt is decondioned with chronic pain and progress is expexcted to be gradual which is reflected in the pt's FOTO score. Pt tolerated today's PT session without adverse effects.    Personal Factors and Comorbidities Comorbidity 3+;Time since onset of injury/illness/exacerbation;Past/Current Experience;Fitness    Comorbidities Grave's Disease, Diabetic Neuropathy, DM, HLD, HTN    Examination-Activity Limitations Transfers;Sit;Lift;Squat;Bend;Stand;Stairs;Locomotion Level    Examination-Participation Restrictions Shop;Community Activity;Cleaning;Laundry    Stability/Clinical Decision Making Evolving/Moderate complexity    Clinical Decision Making Moderate    Rehab Potential Good    PT Frequency 1x / week    PT Duration 6 weeks    PT Treatment/Interventions ADLs/Self Care Home Management;Cryotherapy;Electrical Stimulation;Moist Heat;Gait training;Functional  mobility Network engineer;Therapeutic activities;Therapeutic exercise;Balance training;Neuromuscular re-education;Patient/family education;Manual techniques;Passive range of motion;Dry needling;Taping;Spinal Manipulations;Joint Manipulations;Aquatic Therapy  PT Next Visit Plan Continue to focus on inner core activation, endurance and strength; LE strength; spinal mobility and strength; endurance/    PT Home Exercise Plan [email or web address]    Consulted and Agree with Plan of Care Patient           Patient will benefit from skilled therapeutic intervention in order to improve the following deficits and impairments:  Decreased balance,Postural dysfunction,Pain,Impaired flexibility,Increased fascial restricitons,Decreased strength,Decreased activity tolerance,Decreased range of motion,Impaired perceived functional ability,Improper body mechanics,Hypomobility,Difficulty walking,Decreased endurance  Visit Diagnosis: Chronic left-sided low back pain with left-sided sciatica  Difficulty in walking, not elsewhere classified  Abnormal posture  Muscle weakness (generalized)     Problem List There are no problems to display for this patient.   Joellyn Rued MS, PT 06/23/20 11:06 AM  [facility] [address] [place], [place], [address] Phone: [phone]   Fax:  [phone]  Name: Carmen Armstrong MRN: [id number] Date of Birth: September 15, 1965

## 2020-06-30 ENCOUNTER — Ambulatory Visit: Payer: 59

## 2020-06-30 ENCOUNTER — Other Ambulatory Visit: Payer: Self-pay

## 2020-06-30 ENCOUNTER — Ambulatory Visit: Payer: 59 | Admitting: Physical Therapy

## 2020-06-30 ENCOUNTER — Encounter: Payer: Self-pay | Admitting: Physical Therapy

## 2020-06-30 DIAGNOSIS — R262 Difficulty in walking, not elsewhere classified: Secondary | ICD-10-CM

## 2020-06-30 DIAGNOSIS — M6281 Muscle weakness (generalized): Secondary | ICD-10-CM

## 2020-06-30 DIAGNOSIS — R293 Abnormal posture: Secondary | ICD-10-CM

## 2020-06-30 DIAGNOSIS — M5442 Lumbago with sciatica, left side: Secondary | ICD-10-CM | POA: Diagnosis not present

## 2020-06-30 DIAGNOSIS — G8929 Other chronic pain: Secondary | ICD-10-CM

## 2020-06-30 NOTE — Patient Instructions (Signed)
      Aquatics Home Program  Pool Written Home Exercise All exercises you will feel a stretch but should be PAIN FREE Be aware of neutral spine/Water immersion requires continuous muscle activation with static positioning. You need to be developing muscle endurance - ability to do work over a longer period of time.  1) sitting hip hinge  Sit in waist deep  water  Bend at hips with chest up( show shirt logo) and look up/ chin down Bend forward as far as possible hold 5 sec  x15  2) standing hip hinge  Stand about a foot from wall Cross arms in front of you like holding groceries and shutting car door with buttocks  Try to touch wall with buttocks  Repeat x15   3)  Runner's stretch. use steps in pool and hold onto rail as needed, place right foot on step and bend right knee as far as you can go. This will stretch your left hip flexors as well.  Hold about 10-15 sec and repeat 5 x on Right leg. Repeat with opposite leg 4) Follow with hamstring stretch on step as shown in pool x 5 and hold 15-30 sec  5) Use ball / floating weights/ noodle to press down in water to increase abdominal engagement while walking through the shallow water  Now, Begin with walking back and forth in water increasing speed to increase strength. Go to pool ledge and perform exercises to warm up 1) Marching in place x 20,  2) With knee straight kick leg forward and backward 20 x (Leg flex/ext) Remember to keep core quiet and engaged as shown in clinic. 3)  With knee straight kick leg across body(leading with heel) and away from body (to the side and back and return to across your body as shown in aquatic therapy x 20. Remember to keep core quiet and engaged. 4) Standing by pool ledge,(hamstrings curl) bend knee (as if you are kicking your buttock with your heel) x 20 5) Internal rotatation/External Rotation of Hips standing with Single limb stance on L and R lower extremity in figure 4 pose. Bring knee to midline  and then back 10 x each 6) Heel raises x 30 7) Squat x 20 holding onto pool ledge as deeply as possible 8) Lunge walk across pool 9) Leg circles clockwise and counter clockwise 10 x  10) Using kick board , submerge in water and rotate torso to engage abdominal muscles as shown in water/aquatics  Garen Lah, PT, ATRIC Certified Exercise Expert for the Aging Adult  01/28/20 4:01 PM Phone: [phone] Fax: [phone]  Garen Lah, PT, ATRIC Certified Exercise Expert for the Aging Adult  06/30/20 4:34 PM Phone: [phone] Fax: [phone]

## 2020-06-30 NOTE — Therapy (Signed)
[facility] [address] [place], [place], [address] Phone: [phone]   Fax:  [phone]  Physical Therapy Treatment  Patient Details  Name: Carmen Armstrong MRN: [id number] Date of Birth: 10-Jun-1965 Referring Provider (PT): Joycelyn Schmid, MD   Encounter Date: 06/30/2020   PT End of Session - 06/30/20 1638    Visit Number 12    Number of Visits 16    Authorization Type Aetna    Authorization Time Period FOTO 16 visit    PT Start Time 1330    PT Stop Time 1414    PT Time Calculation (min) 44 min    Activity Tolerance Patient tolerated treatment well    Behavior During Therapy [facility] for tasks assessed/performed           Past Medical History:  Diagnosis Date  . Arrhythmia   . Atrial fibrillation (HCC)   . CAD (coronary artery disease)   . Chronic lower back pain   . Diabetes mellitus without complication (HCC)   . Eczema   . GAD (generalized anxiety disorder)   . H/O vitamin D deficiency   . Heart failure (HCC)   . Hyperlipidemia   . Hypertension   . Insomnia   . PVD (peripheral vascular disease) (HCC)     Past Surgical History:  Procedure Laterality Date  . ANGIOPLASTY     legs  . BACK SURGERY     5 different times  . CARDIAC CATHETERIZATION  2020  . REPLACEMENT TOTAL HIP W/  RESURFACING IMPLANTS Right 2018  . RIGHT AND LEFT HEART CATH    . ROTATOR CUFF REPAIR Right 2016  . SPINE SURGERY  2014,2016,2017,2019  . toe amputated  09/2018   5th left toe    There were no vitals filed for this visit.   Subjective Assessment - 06/30/20 1635    Subjective Pt reports she has 6/10 pain on land but 0/10 in water. she reports that today her R LE was up to an 8/10 while driving. Ms Carmen Armstrong will be attending the [facility] post PT Aquatics after receiving her HEP.  tomorrow she will go to pain management. 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 Small left foraminal disc protrusion at L1-2, closely  approximating and potentially irritating the exiting left L1 nerve  root.  2. Suspected additional small left subarticular disc extrusion with  inferior migration at L1-2, which could potentially affect the  descending left L2 nerve root and contribute to left-sided symptoms.  Finding somewhat difficult to be certain of given adjacent  susceptibility artifact.  3. Prior PLIF at L2-3 through L4-5 without residual or recurrent  stenosis.  4. Severe left worse than right facet degeneration at L5-S1 without  stenosis.    Patient Stated Goals Figure out where her pain is coming from and get rid of it once and for all    Pain Score 6     Pain Location Back    Pain Orientation Right;Left    Pain Descriptors / Indicators Aching    Pain Type Chronic pain    Pain Score 8    Pain Location Hip    Pain Orientation Right    Pain Descriptors / Indicators  Aching    Pain Type Chronic pain    Pain Frequency Constant           Aquatic therapy at [facility] [address] - therapeutic pool temp 88-90 degrees Pt enters building without AD.Treatment took place in water 3.8 to  4 ft 8 in.feet deep depending upon activity. Pt entered and exited the pool via stair and handrails independently.   Pt will be using HEP written and given and reviewed at [facility] in her community.  Time spent reviewing proper execution of all exercises as written below.    She was able to use aquatic barbells while ambulating to increase abdominal engagement with exercises.   Ptreviewed neutral posture and hip hinging   x 10 with back at pool wall at external cue, VC for neck tucked to prevent hyperextension using wall as external cue ,hip hinging  in standing x 10 Runners stretch 2 x Left and then moves into hamstring stretch  Then runners stretch on R and then move into hamstring stretch .TC to insure proper technique. Utilized pool noodle for alternate hamstring stretch in water  Cat Cow in Shallow water with pool noodle Full triangle pose in shallow water with pool noodle  [name] using cuff weights to LE for added resistance in water for exercises. And VC for being mindful or engaged core and neutral pelvis.  On edge of pool with bil UE support Pt performed LE exercise Gastroc stretch with foot at Wall  Hip abd/add R/L 10 x each and then using 1 UE support Hip ext/flex with knee straight x 20, pt needing VC and TC for correct execution and sequencing Marching knee/hip 90/90 x 10 and then with added pool noodle for increased resistance x 10 each leg ham curl R/L x 20. Hips at 90 degree flexion to 90 degrees abduction for IR/ER AROM of R and then L hip with chest deep water. No pain with any movements in water Squats x 20 reps with intermittent UE support x 2 sets. Single limb squats x 10 each R and L Lunge to target x 10 on R and the x 10 on LE Cat/ camel with pool noodle  Forward walking in water x 3 and backward walking  x3 (15 ft each)  Then forward jog and back ward jog x 4 (15 ft each) Forward marching and backward marching x 4 (15 ft ) each Forward March with oppostie Arm knee Taps and submerged aquatic dumbbells 15 ft x 4 Bacward March with opposite Arm knee taps with submerged aquatic dumbbells 15 ft x 4  [place], Pt with lumbar belt around hips and nek doodle for neck support.. Pt assisted into supine floating position by lying head on shoulder of PT to get into floating position. . Pt was able to continue.  PTat torso and assisting with trunk left to right and vice versa to engage trunk muscles.PT then rotated trunk in order to engage abdomnal (internal and external obliques)Emphasis on  breathing techniques to draw in abdominals for support. Pt then utilizing posterior chain and engaging Hip extension and knee flexion with water resistance  LAD for R LE in floating position    Pt requires the buoyancy of water for active assisted exercises with buoyancy supported for strengthening&ROM exercises: PT requires the viscosity of the water forresistance withstrengtheningexercises Hydrostatic pressure also supports joints by unweighting joint load by at least 50 % in 3-4 feet depth water. 80% in chest to neck deep water. Water will  allow for  reduced joint loading through buoyancy to help patient improve posture without excess stress and pain.  Water current provides perturbations which challenge standing balance unsupported                       Access Code: [email or web address]  added to HEP URL: [email or web address] Date: 06/29/2020 Prepared by: Garen Lah  Exercises . Hamstring Stretch with Noodle - 1 x daily - 7 x weekly - 3 sets - 10 reps . Cat Cow in Shallow Water with Pool Noodle - 1 x daily - 1-3 x weekly - 1 sets - 5-10 reps . Full Triangle Pose in [employer] with Pool Noodle - 1 x daily - 1-3 x weekly - 1 sets - 5 reps - 15-30 hold . Lunge to Target at [facility] - 1 x daily - 1-3 x weekly - 2 sets - 10 reps . Forward Jog in Shallow Water - 1 x daily - 1-3 x weekly - 2 sets - 10 reps . Gastroc Stretch with Foot at Wall - 1 x daily - 2 x weekly - 1 reps - 30 seconds hold . Forward Walking - 1 x daily - 2 x weekly - 4 sets . Backward Walking - 1 x daily - 2 x weekly - 4 sets . Forward March - 1 x daily - 2 x weekly - 4 sets . Backward March - 1 x daily - 2 x weekly - 4 sets . Forward March with Opposite Arm Knee Taps and Hand Floats - 1 x daily - 2 x weekly - 4 sets . Backward March with Opposite Arm Knee Taps and Hand Floats - 1 x daily - 2 x weekly - 4 sets . Standing March at [facility] - 1 x daily - 2 x weekly - 15  reps . Standing Hip Flexion Extension at [employer] - 1 x daily - 2 x weekly - 15 reps . Standing Hip Abduction Adduction at Pool Wall - 1 x daily - 2 x weekly - 15 reps . Squat - 1 x daily - 2 x weekly - 15 reps . Single Leg Squat - 1 x daily - 2 x weekly - 15 reps      PT Education - 06/30/20 1637    Education Details Pt reviewed Aquatic HEP and given written handouts to use at [facility]) Educated Patient    Methods Explanation;Demonstration;Tactile cues;Verbal cues;Handout    Comprehension Verbalized understanding;Returned demonstration            PT Short Term Goals - 05/27/20 1020      PT SHORT TERM GOAL #1   Title Pt will be I and compliant with initial HEP.    Status Achieved    Target Date 05/27/20      PT SHORT TERM GOAL #2   Title Pt will decrease pain to </= 10/10 at worst.    Baseline 12/10    Status Achieved    Target Date 05/27/20             PT Long Term Goals - 06/23/20 1103      PT LONG TERM GOAL #2   Title Pt will improve lumbar FOTO ability score to at least 52%, in order to demonstrate significant improvement in perceived level of functional ability. 05/27/20- 40% ability. 06/23/20:33%    Baseline 42% ability    Status On-going    Target Date 07/17/20  Plan - 06/30/20 1639    Clinical Impression Statement Ms Manson PasseyBynum- Jackson enters pool area with complaint of 6/10 back pain and 8.10 R LE pain after driving and then walking from parking /and 5400ft to pool area.  Pt does have zero pain with chest deep water and exercise unweighting jt by 80%.  Pt was able to demo HEP for use at her local [employer] and will continue to use aquatics for pain managmement of her condition.  She was able to participate for full 40 minutes without rests.  Hydrostatic pressure also supports joints by unweighting joint load by at least 50 % in 3-4 feet depth water. 80% in chest to neck deep water.  Water will allow for  reduced joint loading through  buoyancy to help patient improve posture without excess stress and pain.    Personal Factors and Comorbidities Comorbidity 3+;Time since onset of injury/illness/exacerbation;Past/Current Experience;Fitness    Comorbidities Grave's Disease, Diabetic Neuropathy, DM, HLD, HTN    Examination-Activity Limitations Transfers;Sit;Lift;Squat;Bend;Stand;Stairs;Locomotion Level    Examination-Participation Restrictions Shop;Community Activity;Cleaning;Laundry    PT Frequency 1x / week    PT Duration 6 weeks    PT Treatment/Interventions ADLs/Self Care Home Management;Cryotherapy;Electrical Stimulation;Moist Heat;Gait training;Functional mobility Network engineertraining;Stair training;Therapeutic activities;Therapeutic exercise;Balance training;Neuromuscular re-education;Patient/family education;Manual techniques;Passive range of motion;Dry needling;Taping;Spinal Manipulations;Joint Manipulations;Aquatic Therapy    PT Next Visit Plan Continue to focus on inner core activation, endurance and strength; LE strength; spinal mobility and strength; endurance/    PT Home Exercise Plan [email or web address]   Aquatics [email or web address]    Consulted and Agree with Plan of Care Patient           Patient will benefit from skilled therapeutic intervention in order to improve the following deficits and impairments:  Decreased balance,Postural dysfunction,Pain,Impaired flexibility,Increased fascial restricitons,Decreased strength,Decreased activity tolerance,Decreased range of motion,Impaired perceived functional ability,Improper body mechanics,Hypomobility,Difficulty walking,Decreased endurance  Visit Diagnosis: Chronic left-sided low back pain with left-sided sciatica  Difficulty in walking, not elsewhere classified  Abnormal posture  Muscle weakness (generalized)     Problem List There are no problems to display for this patient.  Garen LahLawrie Prakriti Carignan, PT, ATRIC Certified Exercise Expert for the Aging Adult  06/30/20 4:57 PM Phone:  [phone] Fax: [phone]  [facility] [address] [place], [place], [address] Phone: [phone]   Fax:  [phone]  Name: Carmen Armstrong MRN: [id number] Date of Birth: 09-25-1965

## 2020-07-07 ENCOUNTER — Ambulatory Visit: Payer: 59 | Admitting: Physical Therapy

## 2020-07-08 ENCOUNTER — Ambulatory Visit: Payer: 59

## 2020-07-09 ENCOUNTER — Telehealth: Payer: Self-pay

## 2020-07-09 NOTE — Telephone Encounter (Signed)
LVM re: now show appt on 07/08/20. Reminded pt of upcoming appt on 07/13/20. Advised pt re no show policy.

## 2020-07-13 ENCOUNTER — Ambulatory Visit: Payer: 59

## 2020-07-13 ENCOUNTER — Other Ambulatory Visit: Payer: Self-pay

## 2020-07-13 DIAGNOSIS — G8929 Other chronic pain: Secondary | ICD-10-CM

## 2020-07-13 DIAGNOSIS — M6281 Muscle weakness (generalized): Secondary | ICD-10-CM

## 2020-07-13 DIAGNOSIS — R262 Difficulty in walking, not elsewhere classified: Secondary | ICD-10-CM

## 2020-07-13 DIAGNOSIS — M5442 Lumbago with sciatica, left side: Secondary | ICD-10-CM | POA: Diagnosis not present

## 2020-07-13 DIAGNOSIS — R293 Abnormal posture: Secondary | ICD-10-CM

## 2020-07-14 NOTE — Therapy (Signed)
[place], [place], [address] Phone: [phone]   Fax:  [phone]  Physical Therapy Treatment/Discharge  Patient Details  Name: Carmen Armstrong MRN: [id number] Date of Birth: 01-Feb-1966 Referring Provider (PT): Andrey Spearman, MD   Encounter Date: 07/13/2020   PT End of Session - 07/13/20 1127    Visit Number 13    Number of Visits 16    Date for PT Re-Evaluation 07/17/20    Authorization Type Aetna    PT Start Time 1107    PT Stop Time 1151    PT Time Calculation (min) 44 min    Equipment Utilized During Treatment Other (comment)   SPC   Activity Tolerance Patient tolerated treatment well    Behavior During Therapy [facility] for tasks assessed/performed           Past Medical History:  Diagnosis Date  . Arrhythmia   . Atrial fibrillation ([place])   . CAD (coronary artery disease)   . Chronic lower back pain   . Diabetes mellitus without complication ([place])   . Eczema   . GAD (generalized anxiety disorder)   . H/O vitamin D deficiency   . Heart failure ([place])   . Hyperlipidemia   . Hypertension   . Insomnia   . PVD (peripheral vascular disease) ([place])     Past Surgical History:  Procedure Laterality Date  . ANGIOPLASTY     legs  . BACK SURGERY     5 different times  . CARDIAC CATHETERIZATION  2020  . REPLACEMENT TOTAL HIP W/  RESURFACING IMPLANTS Right 2018  . RIGHT AND LEFT HEART CATH    . ROTATOR CUFF REPAIR Right 2016  . SPINE SURGERY  2014,2016,2017,2019  . toe amputated  09/2018   5th left toe    There were no vitals filed for this visit.   Subjective Assessment - 07/14/20 0806    Subjective Pt reports her low back pain has remianed the same.    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 Small left foraminal disc protrusion at L1-2, closely  approximating and potentially irritating the exiting left L1 nerve  root.  2. Suspected additional small left subarticular disc extrusion with  inferior migration at L1-2, which could potentially affect the  descending left L2 nerve root and contribute to left-sided symptoms.  Finding somewhat difficult to be certain of given adjacent  susceptibility artifact.  3. Prior PLIF at L2-3 through L4-5 without residual or recurrent  stenosis.  4. Severe left worse than right facet degeneration at L5-S1 without  stenosis.    Patient Stated Goals Figure out where her pain is coming from and get rid of it once and for all    Currently in Pain? Yes    Pain Score 6     Pain Location Back    Pain Orientation Right;Left    Pain Descriptors / Indicators Aching    Pain Type Chronic pain    Pain Onset More than a month ago    Pain Frequency Constant    Aggravating Factors  generally all activity    Pain Relieving Factors meds , lying down.    Pain Score 7    Pain Location Hip    Pain Orientation Right  Pain Descriptors / Indicators Aching    Pain Type Chronic pain    Pain Onset 1 to 4 weeks ago    Pain Frequency Constant              [facility] PT Assessment - 07/14/20 0001      AROM   Lumbar Flexion 25% limited    Lumbar Extension 50% limited    Lumbar - Right Side Bend 25% limited    Lumbar - Left Side Bend 50% limited    Lumbar - Right Rotation 25% limited    Lumbar - Left Rotation 25% limited      Ambulation/Gait   Gait Comments 6 min walk test   585 feet 0 rest break. with SPC.                         [facility] Adult PT Treatment/Exercise - 07/14/20 0001      Exercises   Exercises Lumbar;Knee/Hip      Lumbar Exercises: Stretches   Lower Trunk Rotation 5 reps;10 seconds    Piriformis Stretch 2 reps;30 seconds;Right    Piriformis Stretch Limitations unable to  tolerate L    Figure 4 Stretch 2 reps;30 seconds      Lumbar Exercises: Seated   Sit to Stand 5 reps   from mat table at 21 inch height; VC for proper tech     Lumbar Exercises: Supine   Ab Set 10 reps;3 seconds   exhale to squeeze ball and draw TA in, inhale to take a deep breath   AB Set Limitations ball squeeze    Pelvic Tilt 10 reps   3 sec   Clam 10 reps;3 seconds    Clam Limitations red band    Bent Knee Raise 10 reps    Bent Knee Raise Limitations marching c core engaged    Bridge 10 reps    Bridge Limitations able to just clear buttock    Other Supine Lumbar Exercises hip add sets c ball sqeeze; 10x; 3 sec    Other Supine Lumbar Exercises Hip clams; 15x, blue band                  PT Education - 07/14/20 3016    Education Details Reviewed final HEP    Person(s) Educated Patient    Methods Explanation;Demonstration;Tactile cues;Verbal cues;Handout    Comprehension Verbalized understanding;Returned demonstration;Verbal cues required;Tactile cues required            PT Short Term Goals - 05/27/20 1020      PT SHORT TERM GOAL #1   Title Pt will be I and compliant with initial HEP.    Status Achieved    Target Date 05/27/20      PT SHORT TERM GOAL #2   Title Pt will decrease pain to </= 10/10 at worst.    Baseline 12/10    Status Achieved    Target Date 05/27/20             PT Long Term Goals - 07/14/20 0810      PT LONG TERM GOAL #1   Title Pt will be independent with long term HEP for improved mobility, strength, and endurance.    Status Achieved    Target Date 07/13/20      PT LONG TERM GOAL #2   Title Pt will improve lumbar FOTO ability score to at least 52%, in order to demonstrate significant improvement in perceived level of functional ability.  05/27/20- 40% ability. 06/23/20:33%    Baseline 42% ability    Status Not Met    Target Date 07/14/20      PT LONG TERM GOAL #3   Title Pt will increase lumbar AROM to [facility] without pain, as needed  for ADLs and IADLs. 07/13/20:Pt demonstrates improved trunk ROM for all motions. Pain is still present.    Baseline painful and limited in all planes    Status Partially Met    Target Date 07/13/20      PT LONG TERM GOAL #4   Title Pt will demonstrate negative FABER B, indicating improvement in hip flexiblity and decreased stress to low back. 07/13/20: L- neg; R- pos    Status Partially Met    Target Date 07/13/20      PT LONG TERM GOAL #5   Title sit to stand from mat x 5 in 30 sec or less with use of UE. 05/27/20; 25.5 sec c use of hands    Baseline 41 sec    Status Achieved    Target Date 05/27/20      PT LONG TERM GOAL #6   Title 6 min walk test 500 feet with no rest breaks. 07/13/20: 585 ft c SPC    Baseline 390 feet today 1 rest break    Status Achieved    Target Date 07/13/20                 Plan - 07/14/20 0817    Clinical Impression Statement Pt is Dced from PT services today. She participated in PT in the clinic and also for aquatics. Pt was able to participated better the therex in the water and is [facility] Pt's pain level did not change over the course of PT, but her functional did improve as demonstrated by the 5X STS and 6 min walking test. Pt is DCed to a HEP on land and aquatic. Pt is in agreement c DC.    Personal Factors and Comorbidities Comorbidity 3+;Time since onset of injury/illness/exacerbation;Past/Current Experience;Fitness    Comorbidities Grave's Disease, Diabetic Neuropathy, DM, HLD, HTN    Examination-Activity Limitations Transfers;Sit;Lift;Squat;Bend;Stand;Stairs;Locomotion Level    Examination-Participation Restrictions Shop;Community Activity;Cleaning;Laundry    Stability/Clinical Decision Making Evolving/Moderate complexity    Clinical Decision Making Moderate    Rehab Potential Good    PT Frequency 1x / week    PT Duration 6 weeks    PT Treatment/Interventions ADLs/Self Care Home  Management;Cryotherapy;Electrical Stimulation;Moist Heat;Gait training;Functional mobility Scientist, forensic;Therapeutic activities;Therapeutic exercise;Balance training;Neuromuscular re-education;Patient/family education;Manual techniques;Passive range of motion;Dry needling;Taping;Spinal Manipulations;Joint Manipulations;Aquatic Therapy    PT Home Exercise Plan [place] [email or web address]   Aquatics [phone]    Consulted and Agree with Plan of Care Patient           Patient will benefit from skilled therapeutic intervention in order to improve the following deficits and impairments:  Decreased balance,Postural dysfunction,Pain,Impaired flexibility,Increased fascial restricitons,Decreased strength,Decreased activity tolerance,Decreased range of motion,Impaired perceived functional ability,Improper body mechanics,Hypomobility,Difficulty walking,Decreased endurance  Visit Diagnosis: Chronic left-sided low back pain with left-sided sciatica  Difficulty in walking, not elsewhere classified  Abnormal posture  Muscle weakness (generalized)     Problem List There are no problems to display for this patient.  PHYSICAL THERAPY DISCHARGE SUMMARY  Visits from Start of Care: 13  Current functional level related to goals / functional outcomes: See above   Remaining deficits: See above   Education / Equipment: HEP  Plan: Patient agrees to discharge.  Patient  goals were not met. Patient is being discharged due to being pleased with the current functional level.  ?????       Gar Ponto MS, PT 07/14/20 8:28 AM  [facility] [address] [place], [place], [address] Phone: [phone]   Fax:  [phone]  Name: Carmen Armstrong MRN: [id number] Date of Birth: 11-Jul-1965

## 2020-07-26 ENCOUNTER — Ambulatory Visit: Payer: 59 | Admitting: Podiatry

## 2020-07-29 ENCOUNTER — Other Ambulatory Visit: Payer: Self-pay | Admitting: Pain Medicine

## 2020-07-29 ENCOUNTER — Ambulatory Visit
Admission: RE | Admit: 2020-07-29 | Discharge: 2020-07-29 | Disposition: A | Discharge status: Home / Self Care | Payer: Managed Care, Other (non HMO) | Source: Ambulatory Visit | Attending: Pain Medicine | Admitting: Pain Medicine

## 2020-07-29 DIAGNOSIS — M25552 Pain in left hip: Secondary | ICD-10-CM

## 2020-07-29 IMAGING — DX DG HIP (WITH OR WITHOUT PELVIS) 2-3V*L*
3 series · 3 of 3 positions shown · non-contrast
Comparison: [DATE]

CLINICAL DATA: Left hip pain.  No injury.

EXAM:
DG HIP (WITH OR WITHOUT PELVIS) 2-3V LEFT

[dg hip unilat w or w/o pelvis 2-3 views  (1 of 3)]
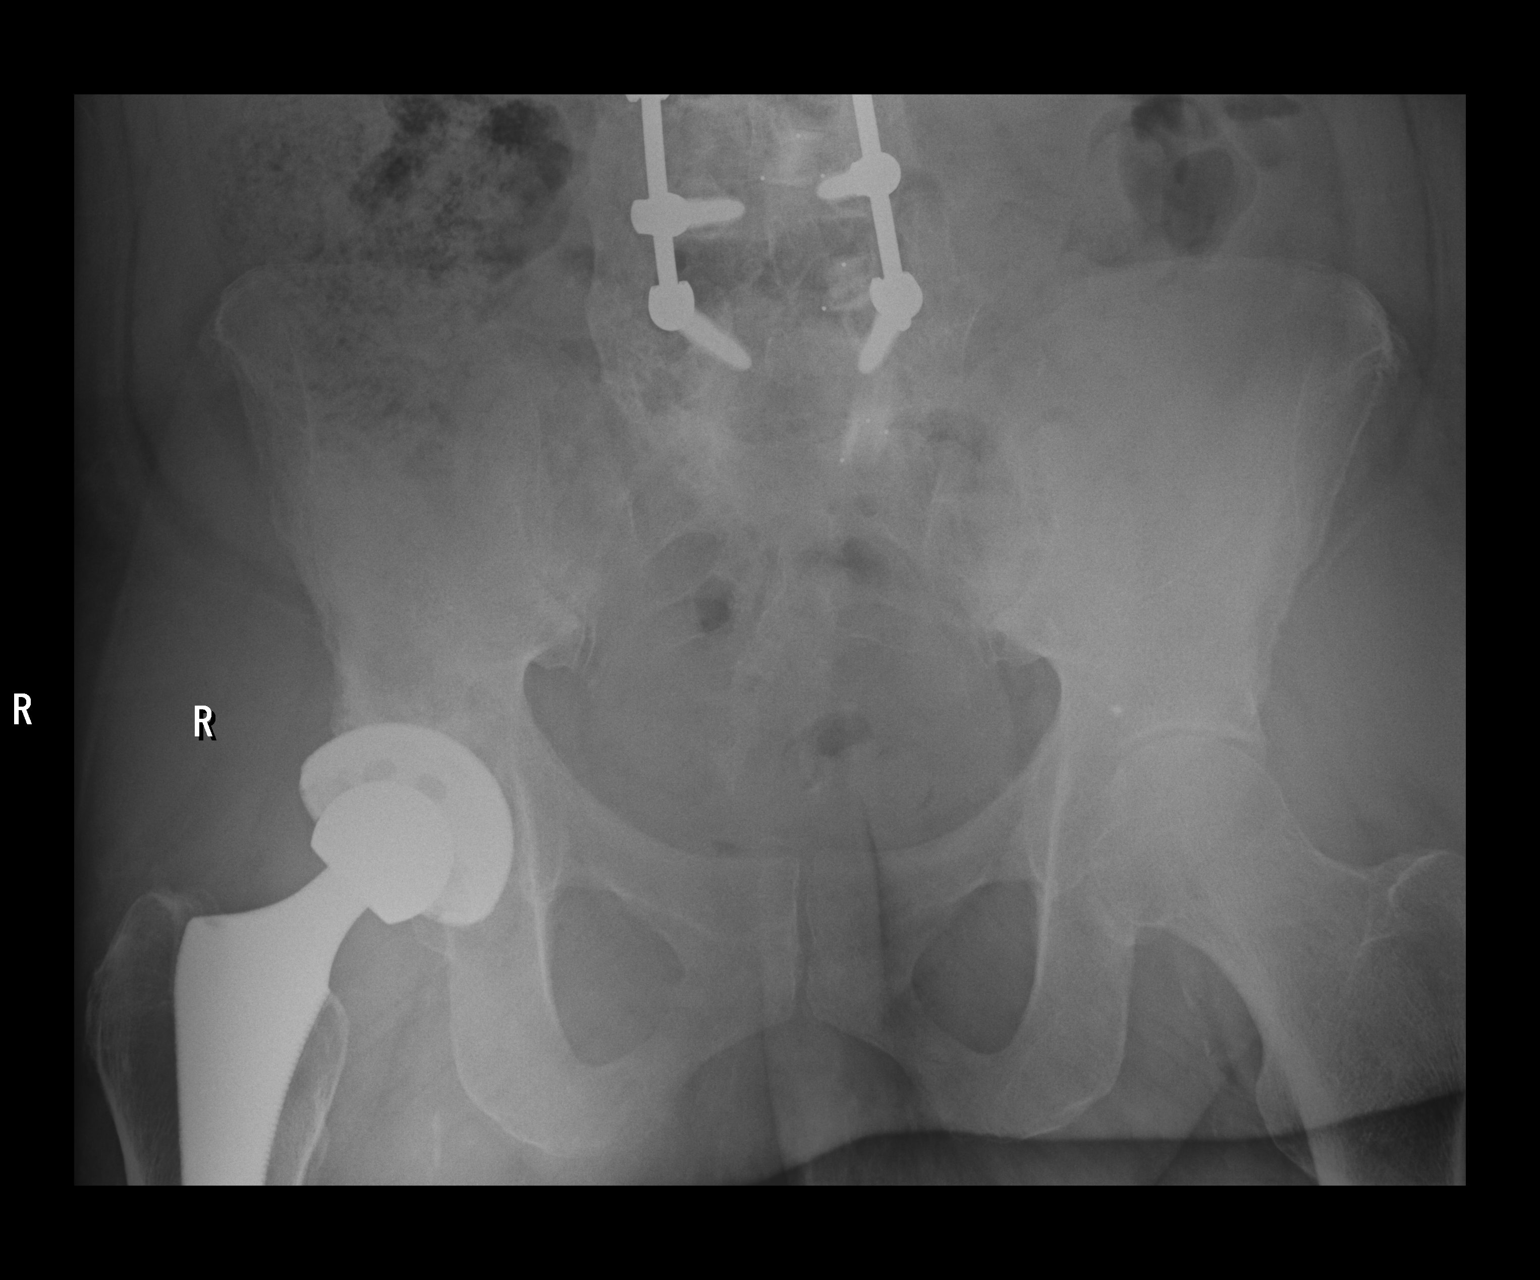

[dg hip unilat w or w/o pelvis 2-3 views  (2 of 3)]
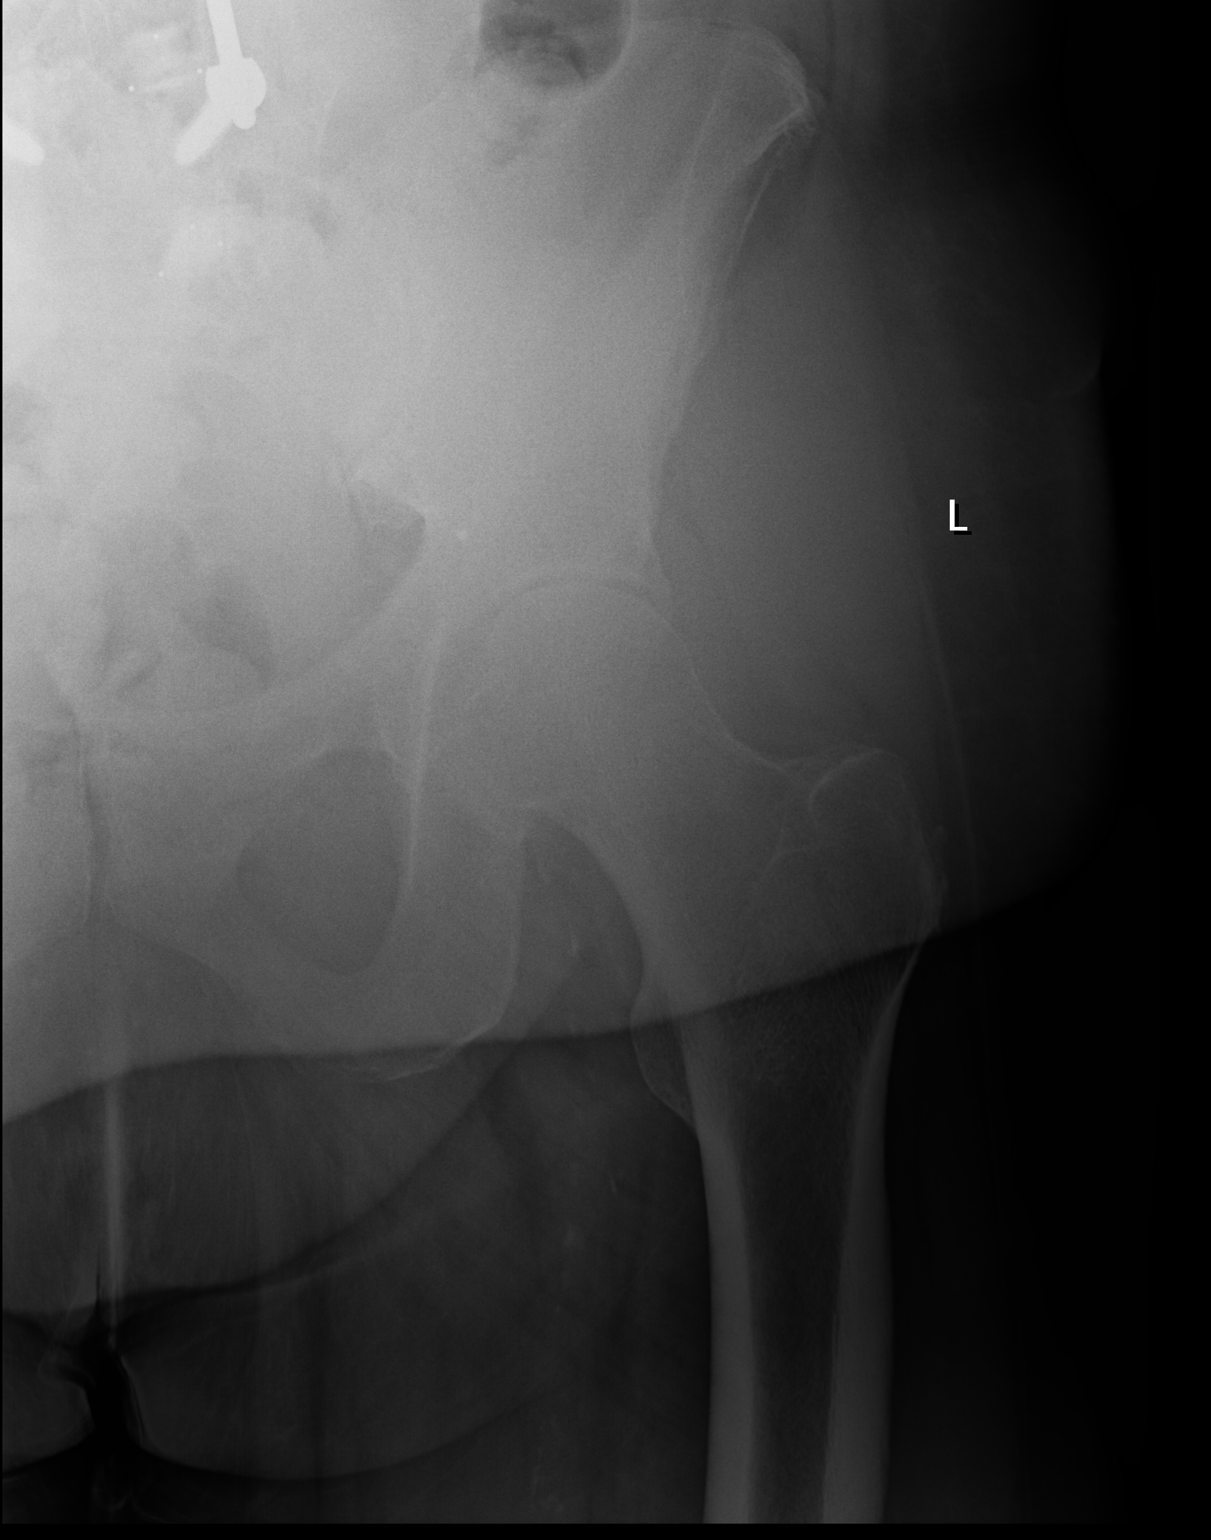

[dg hip unilat w or w/o pelvis 2-3 views  (3 of 3)]
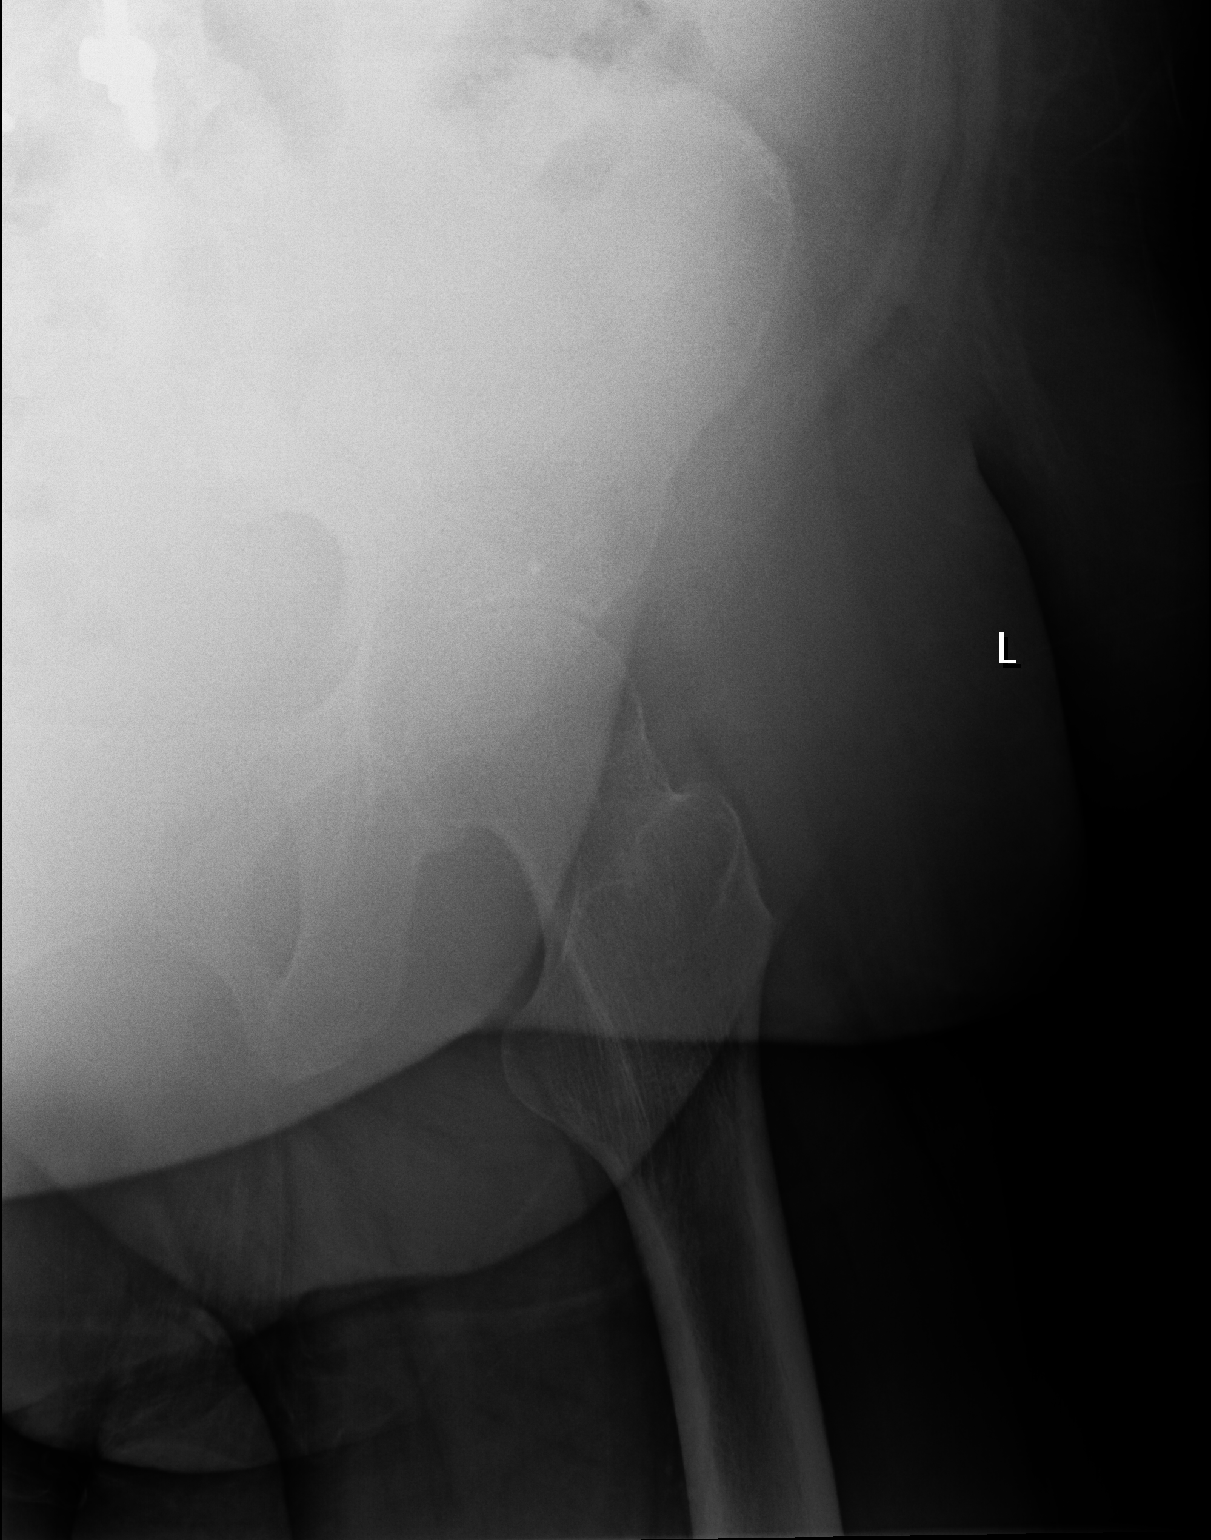

[3 of 3 positions shown; findings below may reference images not displayed]

FINDINGS: The patient is status post right hip replacement. Visualized
portions are hardware in good position. Pedicle rods and screws are
seen in the lumbosacral spine, unchanged on within visualize limits.
Mild degenerative changes in the left hip. No fracture or
dislocation. No other bony abnormalities.
IMPRESSION: Mild degenerative changes in the left hip. No other significant
abnormalities.

## 2020-08-09 ENCOUNTER — Encounter: Payer: Self-pay | Admitting: Podiatry

## 2020-08-09 ENCOUNTER — Ambulatory Visit (INDEPENDENT_AMBULATORY_CARE_PROVIDER_SITE_OTHER): Payer: 59 | Admitting: Podiatry

## 2020-08-09 ENCOUNTER — Other Ambulatory Visit: Payer: Self-pay

## 2020-08-09 DIAGNOSIS — M79674 Pain in right toe(s): Secondary | ICD-10-CM | POA: Diagnosis not present

## 2020-08-09 DIAGNOSIS — M79675 Pain in left toe(s): Secondary | ICD-10-CM | POA: Diagnosis not present

## 2020-08-09 DIAGNOSIS — B351 Tinea unguium: Secondary | ICD-10-CM

## 2020-08-09 DIAGNOSIS — E1142 Type 2 diabetes mellitus with diabetic polyneuropathy: Secondary | ICD-10-CM | POA: Diagnosis not present

## 2020-08-09 DIAGNOSIS — E1140 Type 2 diabetes mellitus with diabetic neuropathy, unspecified: Secondary | ICD-10-CM | POA: Insufficient documentation

## 2020-08-09 DIAGNOSIS — D689 Coagulation defect, unspecified: Secondary | ICD-10-CM | POA: Diagnosis not present

## 2020-08-09 NOTE — Progress Notes (Signed)
This patient returns to my office for at risk foot care.  This patient requires this care by a professional since this patient will be at risk due to having diabetes, coagulation defect and amputation left foot.    This patient is unable to cut nails herself since the patient cannot reach hiernails.These nails are painful walking and wearing shoes.  This patient presents for at risk foot care today.  General Appearance  Alert, conversant and in no acute stress.  Vascular  Dorsalis pedis and posterior tibial  pulses are weakly  palpable  bilaterally.  Capillary return is within normal limits  bilaterally. Temperature is within normal limits  bilaterally.  Neurologic  Senn-Weinstein monofilament wire test absent  bilaterally. Muscle power within normal limits bilaterally.  Nails Thick disfigured discolored nails with subungual debris  from hallux to fifth toes right foot and first through fourth toenail left foot. No evidence of bacterial infection or drainage bilaterally.  Orthopedic  No limitations of motion  feet .  No crepitus or effusions noted.  No bony pathology or digital deformities noted.  Amputation fifth ray left foot.  Skin  normotropic skin with no porokeratosis noted bilaterally.  No signs of infections or ulcers noted.     Onychomycosis  Pain in right toes  Pain in left toes  Consent was obtained for treatment procedures.   Mechanical debridement of nails 1-5  right and 1-4 left performed with a nail nipper.  Filed with dremel without incident. 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   Return office visit   3 months                  Told patient to return for periodic foot care and evaluation due to potential at risk complications.   Helane Gunther DPM

## 2020-08-17 ENCOUNTER — Ambulatory Visit (INDEPENDENT_AMBULATORY_CARE_PROVIDER_SITE_OTHER): Payer: 59

## 2020-08-17 ENCOUNTER — Other Ambulatory Visit: Payer: Self-pay

## 2020-08-17 DIAGNOSIS — E1142 Type 2 diabetes mellitus with diabetic polyneuropathy: Secondary | ICD-10-CM

## 2020-08-17 NOTE — Progress Notes (Signed)
Patient in office today for diabetic shoe selection and casting. Patient wears a size 11 women's wide shoe. Dr. Duanne Guess is currently treating the patient's diabetes. Patient selected shoe [id number] (Black) Wellsite geologist as a first choice and shoe [id number] Juanita Craver) [employer] as a back-up choice. Patient advised the office will call when shoes are available for pick-up. Patient verbalized understanding.

## 2020-08-22 ENCOUNTER — Other Ambulatory Visit: Payer: Self-pay | Admitting: Student

## 2020-08-31 NOTE — Progress Notes (Signed)
Name: Carmen Armstrong  MRN/ DOB: [id number], 11-23-1965    Age/ Sex: 55 y.o., female     PCP: [name], MD   Reason for Endocrinology Evaluation: Hyperthyroidism     Initial Endocrinology Clinic Visit: 11/05/2019    PATIENT IDENTIFIER: [name] is a 55 y.o., female with a past medical history of HTN, T2DM, PAF , CHF and dyslipidemia. She has followed with [facility] since 11/05/2019 for consultative assistance with management of her Hyperthyroidism.   HISTORICAL SUMMARY:   She was diagnosed with hyperthyroidism in 10/2018 during routine workup . Two months prior to her presentation she was noted with dry eyes. Has occasional double vision, and is under the care of an ophthalmologist.     Pt on [place]  Since 2020. S/P ablation . Has not been on amiodarone.   Methimazole started 10/2019   Paternal grandmother with thyroid disease  SUBJECTIVE:     Today (08/31/2020):  Carmen Armstrong is here for hyperthyroidism.   She has noted with wight loss  Having back pains Denies abdominal , or vomiting  Denies local neck symptoms  Sees  An eye specialist at  [facility] - dry eyes     Methimazole 10 mg , 1.5 tabs daily   HISTORY:  Past Medical History:  Past Medical History:  Diagnosis Date   Arrhythmia    Atrial fibrillation ([place])    CAD (coronary artery disease)    Chronic lower back pain    Diabetes mellitus without complication ([place])    Eczema    GAD (generalized anxiety disorder)    H/O vitamin D deficiency    Heart failure ([place])    Hyperlipidemia    Hypertension    Insomnia    PVD (peripheral vascular disease) ([place])    Past Surgical History:  Past Surgical History:  Procedure Laterality Date   ANGIOPLASTY     legs   BACK SURGERY     5 different times   CARDIAC CATHETERIZATION  2020   REPLACEMENT TOTAL HIP W/  RESURFACING IMPLANTS Right 2018   RIGHT AND LEFT HEART CATH     ROTATOR CUFF REPAIR Right 2016    SPINE SURGERY  2014,2016,2017,2019   toe amputated  09/2018   5th left toe   Social History:  reports that she has been smoking cigarettes. She has a 15.00 pack-year smoking history. She has never used smokeless tobacco. She reports previous alcohol use. She reports previous drug use. Family History:  Family History  Problem Relation Age of Onset   Hypertension Mother    Hyperlipidemia Mother    Diabetes Mother    Stroke Mother    Clotting disorder Father    Hypertension Father    Hyperlipidemia Father    Hyperlipidemia Sister    Diabetes Brother    Neuropathy Brother      HOME MEDICATIONS: Allergies as of 09/01/2020       Reactions   Other    Hazelnuts- SOB   Penicillin G    whelps   Trulicity [dulaglutide]    Large itchy bumps        Medication List        Accurate as of August 31, 2020  5:26 PM. If you have any questions, ask your nurse or doctor.          ALPRAZolam 0.5 MG tablet Commonly known as: XANAX Take by mouth at bedtime.   aspirin EC 81 MG tablet Take 81 mg by mouth  daily. Swallow whole.   atorvastatin 40 MG tablet Commonly known as: LIPITOR Take 40 mg by mouth daily.   baclofen 10 MG tablet Commonly known as: LIORESAL Take 10 mg by mouth 3 (three) times daily as needed.   candesartan 4 MG tablet Commonly known as: ATACAND Take 4 mg by mouth daily.   Eliquis 5 MG Tabs tablet Generic drug: apixaban Take 5 mg by mouth 2 (two) times daily.   furosemide 20 MG tablet Commonly known as: LASIX Take 20 mg by mouth daily.   Linzess 290 MCG Caps capsule Generic drug: linaclotide Take 290 mcg by mouth daily as needed.   metFORMIN 500 MG tablet Commonly known as: GLUCOPHAGE Take 1 tablet by mouth every morning.   methimazole 10 MG tablet Commonly known as: TAPAZOLE Take 1.5 tablets (15 mg total) by mouth daily.   metoprolol succinate 100 MG 24 hr tablet Commonly known as: TOPROL-XL Take 100 mg by mouth daily.   mirtazapine 30 MG  tablet Commonly known as: REMERON Take 30 mg by mouth at bedtime.   Multaq 400 MG tablet Generic drug: dronedarone Take 400 mg by mouth 2 (two) times daily.   oxyCODONE 5 MG immediate release tablet Commonly known as: Oxy IR/ROXICODONE Take 5 mg by mouth 4 (four) times daily as needed.   oxyCODONE-acetaminophen 10-325 MG tablet Commonly known as: PERCOCET SMARTSIG:1 Tablet(s) By Mouth 6 Times Daily   pregabalin 200 MG capsule Commonly known as: LYRICA Take 200 mg by mouth 3 (three) times daily.   sertraline 100 MG tablet Commonly known as: ZOLOFT Take 100 mg by mouth daily.   spironolactone 25 MG tablet Commonly known as: ALDACTONE TAKE 1 TABLET (25 MG TOTAL) BY MOUTH DAILY.   Tyler Aas FlexTouch 100 UNIT/ML FlexTouch Pen Generic drug: insulin degludec SMARTSIG:50 Unit(s) SUB-Q Daily   triamcinolone cream 0.5 % Commonly known as: KENALOG Apply 1 application topically 2 (two) times daily.   Victoza 18 MG/3ML Sopn Generic drug: liraglutide Inject 1.8 mg into the skin daily.   Xigduo XR 11-998 MG Tb24 Generic drug: Dapagliflozin-metFORMIN HCl ER Take 1 tablet by mouth in the morning and at bedtime.          OBJECTIVE:   PHYSICAL EXAM: VS: BP (!) 142/78   Pulse 74   Ht '5\' 9"'  (1.753 m)   Wt 236 lb (107 kg)   SpO2 94%   BMI 34.85 kg/m    EXAM: General: Pt appears well and is in NAD  Eyes: External eye exam normal without stare, lid lag or exophthalmos.  EOM intact.  PERRL.  Neck: General: Supple without adenopathy. Thyroid: Thyroid size normal.  No goiter or nodules appreciated. No thyroid bruit.  Lungs: Clear with good BS bilat with no rales, rhonchi, or wheezes  Heart: Auscultation: RRR.  Abdomen: Normoactive bowel sounds, soft, nontender, without masses or organomegaly palpable  Extremities:  BL LE: Trace  pretibial edema   Mental Status: Judgment, insight: Intact Mood and affect: No depression, anxiety, or agitation     DATA REVIEWED: Results  for AZARRIA, BALINT (MRN [id number]) as of 04/29/2020 13:00  Ref. Range 04/28/2020 11:41  TSH Latest Ref Range: 0.35 - 4.50 uIU/mL 0.04 (L)  T4,Free(Direct) Latest Ref Range: 0.60 - 1.60 ng/dL 0.68     11/05/2019   Gluc 111 mg/dL BUN/Cr 13/0.52 Alk phos 165 ( 48-121)  AST 20 Alt 22   Results for JENASCIA, BUMPASS (MRN [id number]) as of 01/26/2020 13:34  Ref. Range 12/17/2019 10:24  TRAB Latest Ref  Range: <=2.00 IU/L 30.55 (H)    ASSESSMENT / PLAN / RECOMMENDATIONS:   Hyperthyroidism Secondary to Graves' Disease:    - Pt is clinically euthyroid  - No local neck symptoms - Tolerating methimazole without side effects  -Patient will return for labs as we do not have a phlebotomist today   Medications   Continue Methimazole 10 mg , 1.5 tabs daily     2. Graves' Disease:   No extrathyroidal manifestations of  Graves' Disease.     F/U in 4 months     Signed electronically by: Mack Guise, MD  [facility]  [facility] [address] [place], [place] [address] Phone: [phone] FAX: [phone]      CC: [name], [address] [place] [address] Phone: [phone]  Fax: [phone]   Return to Endocrinology clinic as below: Future Appointments  Date Time Provider [place]  09/01/2020 11:10 AM Lyndon Chenoweth, Melanie Crazier, MD [facility] None  10/14/2020 12:30 PM Star Age, MD [facility] None  11/15/2020  1:45 PM Gardiner Barefoot, DPM [facility]  11/16/2020 11:30 AM [place], Gerline Legacy, PA-C [facility] None

## 2020-09-01 ENCOUNTER — Telehealth: Payer: Self-pay | Admitting: Podiatry

## 2020-09-01 ENCOUNTER — Ambulatory Visit (INDEPENDENT_AMBULATORY_CARE_PROVIDER_SITE_OTHER): Payer: 59 | Admitting: Internal Medicine

## 2020-09-01 ENCOUNTER — Other Ambulatory Visit: Payer: Self-pay

## 2020-09-01 ENCOUNTER — Encounter: Payer: Self-pay | Admitting: Internal Medicine

## 2020-09-01 VITALS — BP 142/78 | HR 74 | Ht 69.0 in | Wt 236.0 lb

## 2020-09-01 DIAGNOSIS — E0590 Thyrotoxicosis, unspecified without thyrotoxic crisis or storm: Secondary | ICD-10-CM

## 2020-09-01 DIAGNOSIS — E0500 Thyrotoxicosis with diffuse goiter without thyrotoxic crisis or storm: Secondary | ICD-10-CM | POA: Diagnosis not present

## 2020-09-01 NOTE — Telephone Encounter (Signed)
Per Diane @ [employer] insurance the cpt codes A5500/A5514/L3020 (diabetic shoes/inserts and orthotics)are all valid and billable as long as pt is diabetic. Pt has met 750.00 deductible and 1500.00 out of pocket so they will be covered at 100% (this calendar yr) no authorization is required.. reference # [id number]... 

## 2020-09-01 NOTE — Patient Instructions (Signed)
  1) Take Methimazole 10 mg, ONE AND A HALF  tablets daily

## 2020-09-07 ENCOUNTER — Other Ambulatory Visit: Payer: Self-pay

## 2020-09-07 ENCOUNTER — Other Ambulatory Visit (INDEPENDENT_AMBULATORY_CARE_PROVIDER_SITE_OTHER): Payer: 59

## 2020-09-07 DIAGNOSIS — E0590 Thyrotoxicosis, unspecified without thyrotoxic crisis or storm: Secondary | ICD-10-CM | POA: Diagnosis not present

## 2020-09-07 LAB — CBC WITH DIFFERENTIAL/PLATELET
Basophils Absolute: 0 10*3/uL (ref 0.0–0.1)
Basophils Relative: 0.7 % (ref 0.0–3.0)
Eosinophils Absolute: 0.2 10*3/uL (ref 0.0–0.7)
Eosinophils Relative: 3.2 % (ref 0.0–5.0)
HCT: 40.9 % (ref 36.0–46.0)
Hemoglobin: 12.8 g/dL (ref 12.0–15.0)
Lymphocytes Relative: 45.8 % (ref 12.0–46.0)
Lymphs Abs: 2.6 10*3/uL (ref 0.7–4.0)
MCHC: 31.3 g/dL (ref 30.0–36.0)
MCV: 82.6 fl (ref 78.0–100.0)
Monocytes Absolute: 0.4 10*3/uL (ref 0.1–1.0)
Monocytes Relative: 7.4 % (ref 3.0–12.0)
Neutro Abs: 2.4 10*3/uL (ref 1.4–7.7)
Neutrophils Relative %: 42.9 % — ABNORMAL LOW (ref 43.0–77.0)
Platelets: 177 10*3/uL (ref 150.0–400.0)
RBC: 4.95 Mil/uL (ref 3.87–5.11)
RDW: 17.9 % — ABNORMAL HIGH (ref 11.5–15.5)
WBC: 5.6 10*3/uL (ref 4.0–10.5)

## 2020-09-07 LAB — COMPREHENSIVE METABOLIC PANEL
ALT: 11 U/L (ref 0–35)
AST: 12 U/L (ref 0–37)
Albumin: 4.1 g/dL (ref 3.5–5.2)
Alkaline Phosphatase: 112 U/L (ref 39–117)
BUN: 15 mg/dL (ref 6–23)
CO2: 23 mEq/L (ref 19–32)
Calcium: 9.5 mg/dL (ref 8.4–10.5)
Chloride: 106 mEq/L (ref 96–112)
Creatinine, Ser: 0.83 mg/dL (ref 0.40–1.20)
GFR: 79.12 mL/min (ref >60.00)
Glucose, Bld: 210 mg/dL — ABNORMAL HIGH (ref 70–99)
Potassium: 4.9 mEq/L (ref 3.5–5.1)
Sodium: 138 mEq/L (ref 135–145)
Total Bilirubin: 0.4 mg/dL (ref 0.2–1.2)
Total Protein: 7.5 g/dL (ref 6.0–8.3)

## 2020-09-07 LAB — T4, FREE: Free T4: 0.58 ng/dL — ABNORMAL LOW (ref 0.60–1.60)

## 2020-09-07 LAB — TSH: TSH: 2.16 u[IU]/mL (ref 0.35–5.50)

## 2020-09-07 MED ORDER — METHIMAZOLE 10 MG PO TABS: 10.0000 mg | ORAL_TABLET | Freq: Every day | ORAL | 1 refills | Status: DC

## 2020-09-07 NOTE — Progress Notes (Signed)
TFT's are back with low FT4 and normal TSH   Decrease methimazole 10 to 1 tablet daily   Abby Raelyn Mora, MD  [facility] Endocrin[facility]  [facility] [address] [place], [place] [address] Phone: [phone] FAX: [phone]

## 2020-10-13 ENCOUNTER — Encounter: Payer: Self-pay | Admitting: *Deleted

## 2020-10-14 ENCOUNTER — Ambulatory Visit (INDEPENDENT_AMBULATORY_CARE_PROVIDER_SITE_OTHER): Payer: 59 | Admitting: Neurology

## 2020-10-14 ENCOUNTER — Encounter: Payer: Self-pay | Admitting: Neurology

## 2020-10-14 VITALS — BP 120/67 | HR 56 | Ht 69.0 in | Wt 233.6 lb

## 2020-10-14 DIAGNOSIS — R0683 Snoring: Secondary | ICD-10-CM | POA: Diagnosis not present

## 2020-10-14 DIAGNOSIS — G4719 Other hypersomnia: Secondary | ICD-10-CM | POA: Diagnosis not present

## 2020-10-14 DIAGNOSIS — R351 Nocturia: Secondary | ICD-10-CM | POA: Diagnosis not present

## 2020-10-14 DIAGNOSIS — E669 Obesity, unspecified: Secondary | ICD-10-CM | POA: Diagnosis not present

## 2020-10-14 NOTE — Progress Notes (Signed)
Subjective:    Patient ID: Carmen Armstrong is a 55 y.o. female.  HPI    Huston Foley, MD, PhD [facility] [address], [address] [address] [place], [place] [address]  Dear Carmen Armstrong,   I saw your patient, Carmen Armstrong, upon your kind request in my sleep clinic today for initial consultation of Carmen Armstrong sleep disorder, in particular, concern for underlying obstructive sleep apnea.  The patient is unaccompanied today.  As you know, [name], is a 55 year old right-handed woman with an underlying medical history of coronary artery disease, atrial fibrillation, with status post cardioversion, status post ablation, diabetes, vitamin D deficiency, hypertension, hyperlipidemia, peripheral vascular disease, chronic low back pain, Graves' disease, neuropathy (followed by my colleague, Dr. Marjory Lies), and obesity, who reports snoring and excessive daytime somnolence.  I reviewed your telemedicine office note from 08/05/2020.  Carmen Armstrong Epworth sleepiness score is 14 out of 24, fatigue severity score is 49 out of 63.  She is familiar with the sleep apnea diagnosis as Carmen Armstrong husband used to have a CPAP machine.  Carmen Armstrong husband passed away in 08-Jun-2016.  She moved from the [place] area to [place] about a year ago.  Carmen Armstrong niece is currently staying with Carmen Armstrong but niece is moving to [place] in the next 2 weeks.  Patient has 2 dogs in the household, she does sleep with the TV on for white noise.  Bedtime is generally around 11, rise time around 830.  She has nocturia about 2-3 times per average night, denies recurrent morning headaches.  She is currently followed by pain management for Carmen Armstrong low back pain and hip pain.  She takes Xanax at night for sleep, no longer on Remeron.  She reports ongoing neuropathy pain, does not currently have a follow-up appointment with Dr. Marjory Lies.  She does not drink alcohol.  She reports that she smokes about half a pack per day.  She does drink caffeine in the  form of coffee, usually 1 cup/day but soda is about 4 to 5 cans/day.  Carmen Armstrong weight has been fluctuating especially when she was diagnosed with Graves' disease.  She has no children.  She moved to [place] to be closer to Carmen Armstrong family.  She retired as a Engineer, site.   Carmen Armstrong Past Medical History Is Significant For: Past Medical History:  Diagnosis Date   Arrhythmia    Atrial fibrillation ([facility])    CAD (coronary artery disease)    Chronic lower back pain    Diabetes mellitus without complication ([facility])    Eczema    GAD (generalized anxiety disorder)    H/O vitamin D deficiency    Heart failure ([facility])    Hyperlipidemia    Hypertension    Insomnia    PVD (peripheral vascular disease) ([facility])     Carmen Armstrong Past Surgical History Is Significant For: Past Surgical History:  Procedure Laterality Date   ANGIOPLASTY     legs   BACK SURGERY     5 different times   CARDIAC CATHETERIZATION  06-09-2018   REPLACEMENT TOTAL HIP W/  RESURFACING IMPLANTS Right 2016/06/08   RIGHT AND LEFT HEART CATH     ROTATOR CUFF REPAIR Right Jun 09, 2014   SPINE SURGERY  2014,2016,2017,2019   toe amputated  09/2018   5th left toe    Carmen Armstrong Family History Is Significant For: Family History  Problem Relation Age of Onset   Hypertension Mother    Hyperlipidemia Mother    Diabetes Mother    Stroke Mother  Clotting disorder Father    Hypertension Father    Hyperlipidemia Father    Hyperlipidemia Sister    Diabetes Brother    Neuropathy Brother     Carmen Armstrong Social History Is Significant For: Social History   Socioeconomic History   Marital status: Widowed    Spouse name: Not on file   Number of children: 0   Years of education: Not on file   Highest education level: Bachelor's degree (e.g., BA, AB, BS)  Occupational History    Comment: retired Runner, broadcasting/film/video  Tobacco Use   Smoking status: Every Day    Packs/day: 0.50    Years: 30.00    Pack years: 15.00    Types: Cigarettes   Smokeless tobacco: Never  Vaping Use   Vaping Use:  Never used  Substance and Sexual Activity   Alcohol use: Not Currently   Drug use: Not Currently   Sexual activity: Not on file  Other Topics Concern   Not on file  Social History Narrative   Lives with mom, moved from [place] 07/2019   Caffeine- 3 c weekly   Social Determinants of Health   Financial Resource Strain: Not on file  Food Insecurity: Not on file  Transportation Needs: Not on file  Physical Activity: Not on file  Stress: Not on file  Social Connections: Not on file    Carmen Armstrong Allergies Are:  Allergies  Allergen Reactions   Other     Hazelnuts- SOB   Penicillin G     whelps   Trulicity [Dulaglutide]     Large itchy bumps  :   Carmen Armstrong Current Medications Are:  Outpatient Encounter Medications as of 10/14/2020  Medication Sig   ALPRAZolam (XANAX) 0.5 MG tablet Take by mouth at bedtime.   aspirin EC 81 MG tablet Take 81 mg by mouth daily. Swallow whole.   atorvastatin (LIPITOR) 40 MG tablet Take 40 mg by mouth daily.   baclofen (LIORESAL) 10 MG tablet Take 10 mg by mouth 3 (three) times daily as needed.   candesartan (ATACAND) 4 MG tablet Take 4 mg by mouth daily.   ELIQUIS 5 MG TABS tablet Take 5 mg by mouth 2 (two) times daily.   furosemide (LASIX) 20 MG tablet Take 20 mg by mouth daily.   LINZESS 290 MCG CAPS capsule Take 290 mcg by mouth daily as needed.   metFORMIN (GLUCOPHAGE) 500 MG tablet Take 1 tablet by mouth every morning.   methimazole (TAPAZOLE) 10 MG tablet Take 1 tablet (10 mg total) by mouth daily.   metoprolol succinate (TOPROL-XL) 100 MG 24 hr tablet Take 100 mg by mouth daily.   oxyCODONE (OXY IR/ROXICODONE) 5 MG immediate release tablet Take 5 mg by mouth 4 (four) times daily as needed.   oxyCODONE-acetaminophen (PERCOCET) 10-325 MG tablet SMARTSIG:1 Tablet(s) By Mouth 6 Times Daily   pregabalin (LYRICA) 200 MG capsule Take 200 mg by mouth 3 (three) times daily.   Sertraline HCl 150 MG CAPS Take 1 capsule by mouth daily.   spironolactone  (ALDACTONE) 25 MG tablet TAKE 1 TABLET (25 MG TOTAL) BY MOUTH DAILY.   TRESIBA FLEXTOUCH 100 UNIT/ML FlexTouch Pen SMARTSIG:50 Unit(s) SUB-Q Daily   triamcinolone cream (KENALOG) 0.5 % Apply 1 application topically 2 (two) times daily.   XIGDUO XR 11-998 MG TB24 Take 1 tablet by mouth in the morning and at bedtime.    [DISCONTINUED] sertraline (ZOLOFT) 100 MG tablet Take 100 mg by mouth daily.   mirtazapine (REMERON) 30 MG tablet Take  30 mg by mouth at bedtime. (Patient not taking: Reported on 10/14/2020)   MULTAQ 400 MG tablet Take 400 mg by mouth 2 (two) times daily. (Patient not taking: Reported on 10/14/2020)   VICTOZA 18 MG/3ML SOPN Inject 1.8 mg into the skin daily. (Patient not taking: Reported on 10/14/2020)   No facility-administered encounter medications on file as of 10/14/2020.  :   Review of Systems:  Out of a complete 14 point review of systems, all are reviewed and negative with the exception of these symptoms as listed below:  Review of Systems  Neurological:         R/O sleep apnea - daytime fatigue, snoring, obesity. ESS-14, FSS:49   Objective:  Neurological Exam  Physical Exam Physical Examination:   Vitals:   10/14/20 1232  BP: 120/67  Pulse: (!) 56    General Examination: The patient is a very pleasant 55 y.o. female in no acute distress. She appears well-developed and well-nourished and well groomed.   HEENT: Normocephalic, atraumatic, pupils are equal, round and reactive to light, mild bilateral proptosis noted, extraocular tracking well-preserved, corrective eyeglasses in place.  Hearing is grossly intact. Face is symmetric with normal facial animation. Speech is clear with no dysarthria noted. There is no hypophonia. There is no lip, neck/head, jaw or voice tremor. Neck is supple with full range of passive and active motion. There are no carotid bruits on auscultation. Oropharynx exam reveals: mild mouth dryness, adequate dental hygiene with full dentures, moderate  airway crowding, Mallampati class IV.  Tonsils and uvula not fully visualized.  Tongue protrudes centrally.  Neck circumference of 16-1/4 inches.   Chest: Clear to auscultation without wheezing, rhonchi or crackles noted.  Heart: S1+S2+0, mildly irregular, mild systolic heart murmur noted (not new per patient).    Abdomen: Soft, non-tender and non-distended.  Extremities: There is trace pitting edema in the distal lower extremities bilaterally.   Skin: Warm and dry with mild discoloration distal lower extremities, darker pigmentation.     Musculoskeletal: exam reveals no obvious joint deformities.   Neurologically:  Mental status: The patient is awake, alert and oriented in all 4 spheres. Carmen Armstrong immediate and remote memory, attention, language skills and fund of knowledge are appropriate. There is no evidence of aphasia, agnosia, apraxia or anomia. Speech is clear with normal prosody and enunciation. Thought process is linear. Mood is normal and affect is normal.  Cranial nerves II - XII are as described above under HEENT exam.  Motor exam: Normal bulk, strength and tone is noted. There is no tremor, Romberg is negative. Reflexes are 2+ throughout. Fine motor skills and coordination: grossly intact.  Cerebellar testing: No dysmetria or intention tremor. There is no truncal or gait ataxia.  Sensory exam: intact to light touch in the upper and lower extremities.  Gait, station and balance: She stands with mild difficulty.  She walks with a single-point cane.  She walks slowly and cautiously.   Assessment and Plan:  In summary, Glorianne Manchesteronuia Bynum-Jackson is a very pleasant 55 y.o.-year old female with an underlying medical history of coronary artery disease, atrial fibrillation, with status post cardioversion, status post ablation, diabetes, vitamin D deficiency, hypertension, hyperlipidemia, peripheral vascular disease, chronic low back pain, Graves' disease, neuropathy (followed by my colleague, Dr.  Marjory LiesPenumalli), and obesity, whose history and physical exam are concerning for obstructive sleep apnea (OSA). I had a long chat with the patient about my findings and the diagnosis of OSA, its prognosis and treatment options. We talked about  medical treatments, surgical interventions and non-pharmacological approaches. I explained in particular the risks and ramifications of untreated moderate to severe OSA, especially with respect to developing cardiovascular disease down the Road, including congestive heart failure, difficult to treat hypertension, cardiac arrhythmias, or stroke. Even type 2 diabetes has, in part, been linked to untreated OSA. Symptoms of untreated OSA include daytime sleepiness, memory problems, mood irritability and mood disorder such as depression and anxiety, lack of energy, as well as recurrent headaches, especially morning headaches. We talked about smoking cessation and trying to maintain a healthy lifestyle in general, as well as the importance of weight control. We also talked about the importance of good sleep hygiene. I recommended the following at this time: sleep study.  I explained the difference between a laboratory attended sleep study versus home sleep test.  I talked Carmen Armstrong about possible surgical and non-surgical treatment options of OSA, including the use of a custom-made dental device (which would require a referral to a specialist dentist or oral surgeon), upper airway surgical options, such as traditional UPPP or a novel less invasive surgical option in the form of Inspire hypoglossal nerve stimulation (which would involve a referral to an ENT surgeon). I also explained the CPAP treatment option to the patient, who indicated that she would be willing to try CPAP if the need arises. I explained the importance of being compliant with PAP treatment, not only for insurance purposes but primarily to improve Carmen Armstrong symptoms, and for the patient's long term health benefit, including to  reduce Carmen Armstrong cardiovascular risks.  Realistically speaking given Carmen Armstrong blood thinner, she may not be a good surgical candidate.  Given Carmen Armstrong dentures, she may not be a good candidate for dental device.  She would be a good candidate potentially for positive airway pressure device.  She is requesting a follow-up appointment with Dr. Marjory Lies.  She was encouraged to make this on Carmen Armstrong way out today.  We will keep Carmen Armstrong posted as to Carmen Armstrong test results by phone call and pick up our discussion about treatment options for sleep apnea and plan a follow-up accordingly.  I answered all Carmen Armstrong questions today and she was in agreement. Thank you very much for allowing me to participate in the care of this nice patient. If I can be of any further assistance to you please do not hesitate to call me at [phone].  Sincerely,   Huston Foley, MD, PhD

## 2020-10-14 NOTE — Patient Instructions (Addendum)
It was nice to meet you today. Based on your symptoms and your exam I believe you are at risk for obstructive sleep apnea (aka OSA), and I think we should proceed with a sleep study to determine whether you do or do not have OSA and how severe it is. Even, if you have mild OSA, I may want you to consider treatment with CPAP, as treatment of even borderline or mild sleep apnea can result and improvement of symptoms such as sleep disruption, daytime sleepiness, nighttime bathroom breaks, restless leg symptoms, improvement of headache syndromes, even improved mood disorder.   As explained, an attended sleep study meaning you get to stay overnight in the sleep lab, lets us monitor sleep-related behaviors such as sleep talking and leg movements in sleep, in addition to monitoring for sleep apnea.  A home sleep test is a screening tool for sleep apnea only, and unfortunately does not help with any other sleep-related diagnoses.  Please remember, the long-term risks and ramifications of untreated moderate to severe obstructive sleep apnea are: increased Cardiovascular disease, including congestive heart failure, stroke, difficult to control hypertension, treatment resistant obesity, arrhythmias, especially irregular heartbeat commonly known as A. Fib. (atrial fibrillation); even type 2 diabetes has been linked to untreated OSA.   Sleep apnea can cause disruption of sleep and sleep deprivation in most cases, which, in turn, can cause recurrent headaches, problems with memory, mood, concentration, focus, and vigilance. Most people with untreated sleep apnea report excessive daytime sleepiness, which can affect their ability to drive. Please do not drive if you feel sleepy. Patients with sleep apnea can also develop difficulty initiating and maintaining sleep (aka insomnia).   Having sleep apnea may increase your risk for other sleep disorders, including involuntary behaviors sleep such as sleep terrors, sleep  talking, sleepwalking.    Having sleep apnea can also increase your risk for restless leg syndrome and leg movements at night.   Please note that untreated obstructive sleep apnea may carry additional perioperative morbidity. Patients with significant obstructive sleep apnea (typically, in the moderate to severe degree) should receive, if possible, perioperative PAP (positive airway pressure) therapy and the surgeons and particularly the anesthesiologists should be informed of the diagnosis and the severity of the sleep disordered breathing.   I will likely see you back after your sleep study to go over the test results and where to go from there. We will call you after your sleep study to advise about the results (most likely, you will hear from Megan, my nurse) and to set up an appointment at the time, as necessary.    Our sleep lab administrative assistant will call you to schedule your sleep study and give you further instructions, regarding the check in process for the sleep study, arrival time, what to bring, when you can expect to leave after the study, etc., and to answer any other logistical questions you may have. If you don't hear back from her by about 2 weeks from now, please feel free to call her direct line at [phone] or you can call our general clinic number, or email us through My Chart.    

## 2020-10-26 ENCOUNTER — Ambulatory Visit (INDEPENDENT_AMBULATORY_CARE_PROVIDER_SITE_OTHER): Payer: 59 | Admitting: *Deleted

## 2020-10-26 ENCOUNTER — Other Ambulatory Visit: Payer: Self-pay

## 2020-10-26 DIAGNOSIS — E1142 Type 2 diabetes mellitus with diabetic polyneuropathy: Secondary | ICD-10-CM

## 2020-10-26 NOTE — Progress Notes (Signed)
Patient presents today to pick up diabetic shoes and insoles.  Patient was dispensed 1 pair of diabetic shoes and 3 pairs of foam casted diabetic insoles. She tried on the shoes with the insoles and the fit was not satisfactory.   She states the left shoe felt too tight and she could feel the outside of the shoe pressing against her foot.   I recommended a wider width.  We will send back: Apex A7000 Women's 11 Wide  Reorder: Apex A7000 Women's 11 X-Wide  Insoles were kept in the office to check fit with reordered shoes.  Patient will be contacted for a fitting appointment once the reordered shoes arrive in office.

## 2020-11-09 ENCOUNTER — Other Ambulatory Visit: Payer: Self-pay

## 2020-11-09 ENCOUNTER — Ambulatory Visit (INDEPENDENT_AMBULATORY_CARE_PROVIDER_SITE_OTHER): Payer: 59

## 2020-11-09 DIAGNOSIS — Z89422 Acquired absence of other left toe(s): Secondary | ICD-10-CM | POA: Diagnosis not present

## 2020-11-09 DIAGNOSIS — E1142 Type 2 diabetes mellitus with diabetic polyneuropathy: Secondary | ICD-10-CM

## 2020-11-09 NOTE — Progress Notes (Signed)
Patient in office to pick up diabetic shoes today. Patient was able to try on shoes with custom inserts and patient stated she was satisfied with the fit and feel of both the shoes and inserts today. Patient was educated on the break-in process and return policy. Patient verbalized understanding. Advised patient to call the office with an questions, comments, or concerns.

## 2020-11-11 ENCOUNTER — Encounter (HOSPITAL_COMMUNITY): Payer: Self-pay | Admitting: Emergency Medicine

## 2020-11-11 ENCOUNTER — Ambulatory Visit (HOSPITAL_COMMUNITY)
Admission: EM | Admit: 2020-11-11 | Discharge: 2020-11-11 | Disposition: A | Discharge status: Home / Self Care | Payer: 59 | Attending: Family Medicine | Admitting: Family Medicine

## 2020-11-11 ENCOUNTER — Other Ambulatory Visit: Payer: Self-pay

## 2020-11-11 ENCOUNTER — Ambulatory Visit (INDEPENDENT_AMBULATORY_CARE_PROVIDER_SITE_OTHER): Payer: 59

## 2020-11-11 DIAGNOSIS — S6291XA Unspecified fracture of right wrist and hand, initial encounter for closed fracture: Secondary | ICD-10-CM

## 2020-11-11 DIAGNOSIS — S62356A Nondisplaced fracture of shaft of fifth metacarpal bone, right hand, initial encounter for closed fracture: Secondary | ICD-10-CM | POA: Diagnosis not present

## 2020-11-11 IMAGING — DX DG HAND COMPLETE 3+V*R*
3 series · 3 of 3 positions shown · non-contrast
Comparison: None.

CLINICAL DATA: Fell and injured right hand last night.

EXAM:
RIGHT HAND - COMPLETE 3+ VIEW

[hand pa]
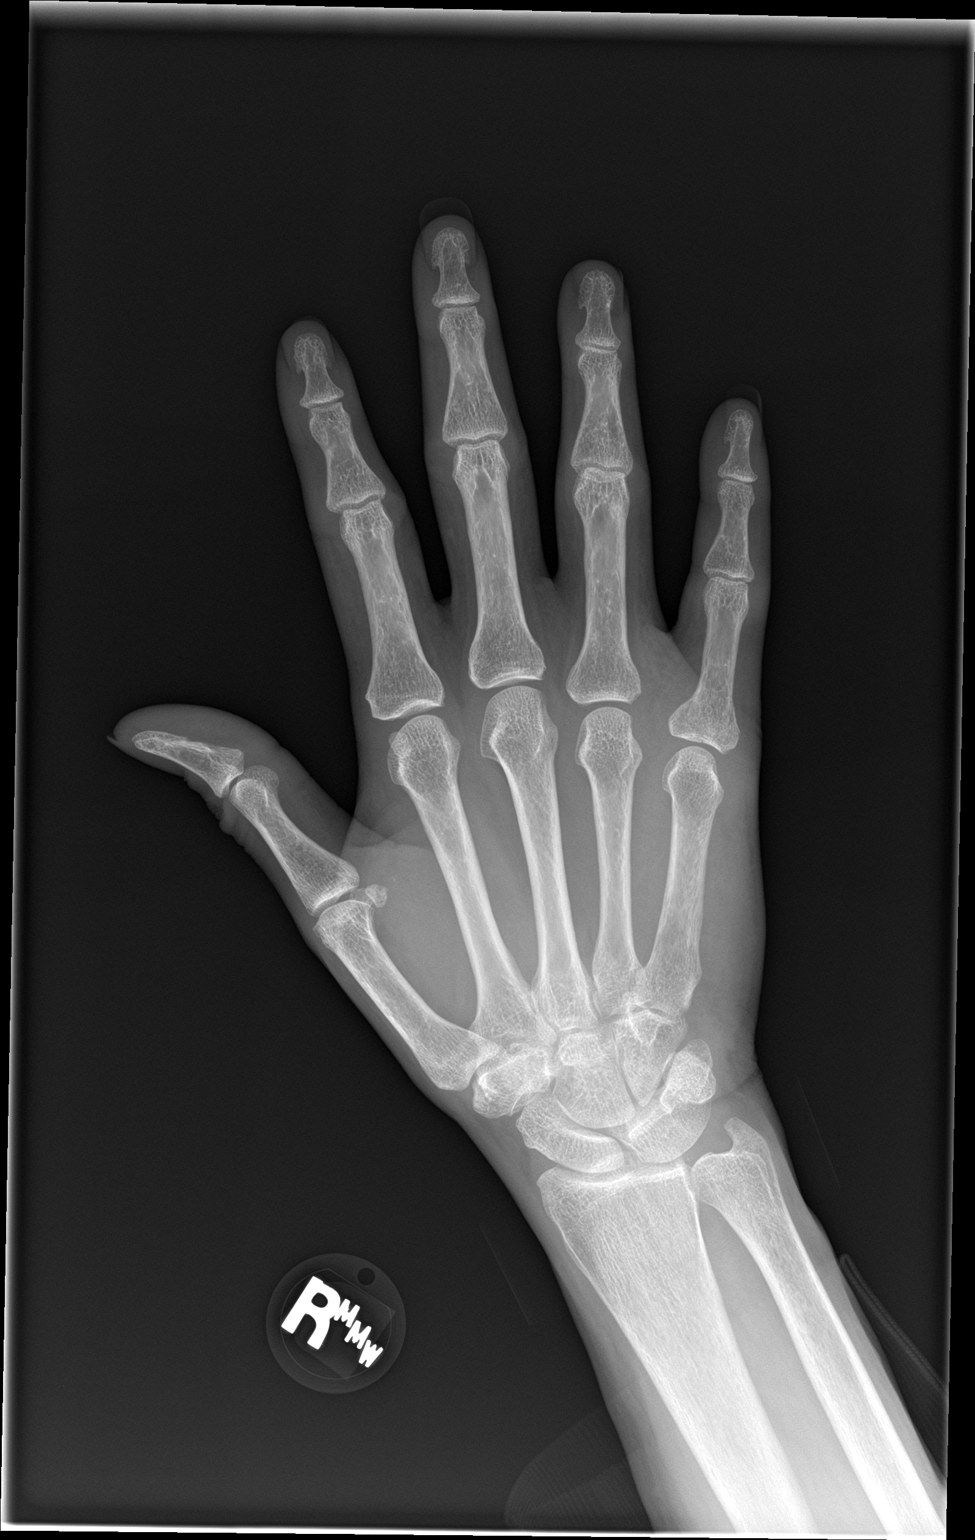

[hand obl]
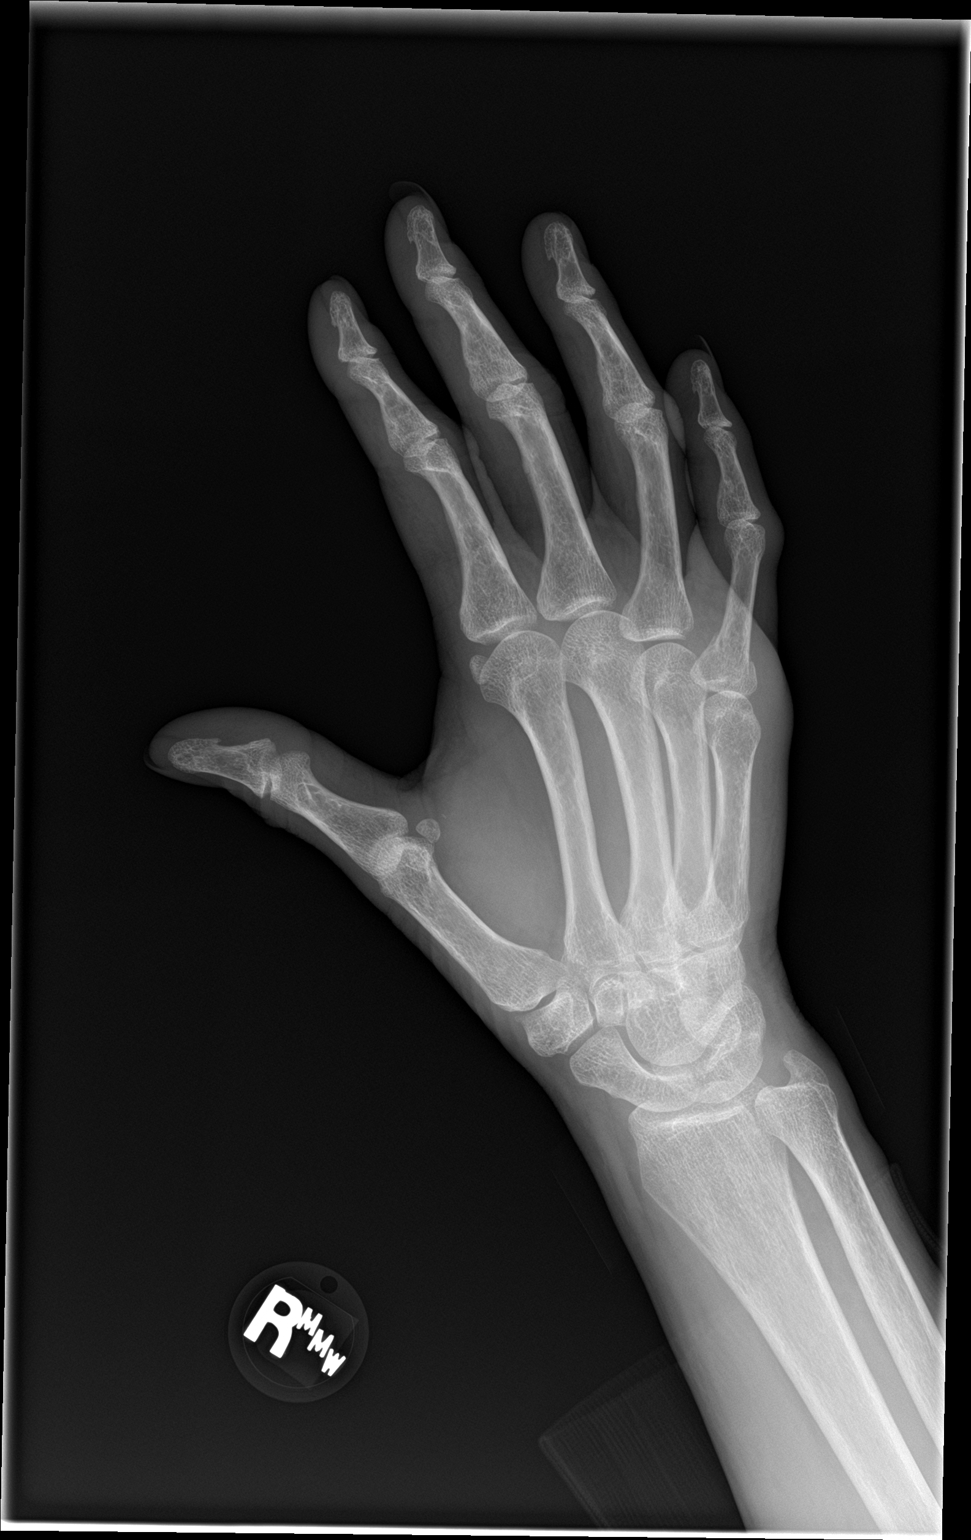

[hand lat]
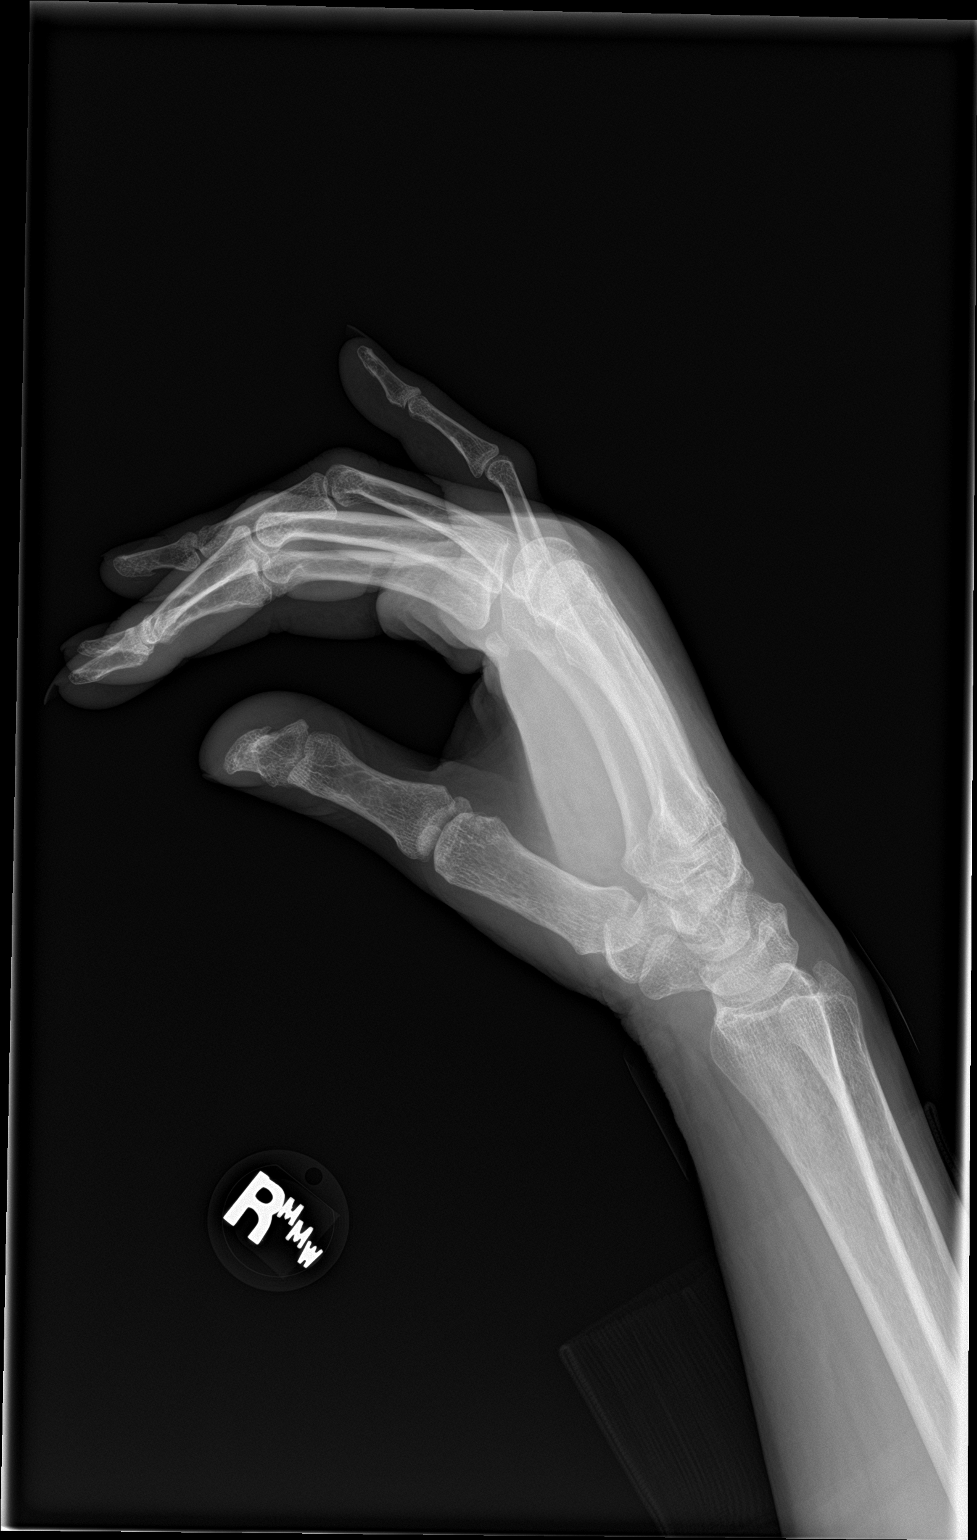

[3 of 3 positions shown; findings below may reference images not displayed]

FINDINGS: There is a nondisplaced intra-articular fracture involving the base
of the fifth proximal phalanx.

No other fractures are identified.

Incidental lunotriquetral coalition at the wrist.
IMPRESSION: Nondisplaced intra-articular fracture involving the base of the
fifth proximal phalanx.

## 2020-11-11 MED ORDER — ACETAMINOPHEN 325 MG PO TABS
ORAL_TABLET | ORAL | Status: AC
  Filled 2020-11-11: qty 3

## 2020-11-11 MED ORDER — ACETAMINOPHEN 325 MG PO TABS
975.0000 mg | ORAL_TABLET | Freq: Once | ORAL | Status: AC
  Administered 2020-11-11: 975 mg via ORAL

## 2020-11-11 NOTE — ED Notes (Signed)
Made ortho aware of splint ordered.  

## 2020-11-11 NOTE — ED Triage Notes (Signed)
Pt reports fell last night off last step when taking dogs out to use the bathroom. Reports swelling to 5th finger on hand that is painful. Takes Eliquis.

## 2020-11-11 NOTE — ED Provider Notes (Signed)
[facility]    CSN: [id number] Arrival date & time: 11/11/20  1632      History   Chief Complaint Chief Complaint  Patient presents with   Fall   Finger Injury   HPI Carmen Armstrong is a 55 y.o. female.   Patient presenting today with significant swelling, decreased range of motion and pain in the pinky finger on the right hand following a fall onto the hand last night when she was taking her dog outside to the bathroom.  She states that she stumbled on the last step from her house, fell on top of her hand.  She states that her head gently hit the grass, she did not have a firm impact to the head and has had no neurologic symptoms since incident.  Denies bleeding, numbness, tingling, pain in other joints, dizziness, nausea, vomiting, headaches.  Has been elevating the area and icing, pain is under poor control.  On Eliquis for atrial fibrillation.   Past Medical History:  Diagnosis Date   Arrhythmia    Atrial fibrillation (HCC)    CAD (coronary artery disease)    Chronic lower back pain    Diabetes mellitus without complication (HCC)    Eczema    GAD (generalized anxiety disorder)    H/O vitamin D deficiency    Heart failure (HCC)    Hyperlipidemia    Hypertension    Insomnia    PVD (peripheral vascular disease) (HCC)     Patient Active Problem List   Diagnosis Date Noted   Pain due to onychomycosis of toenails of both feet 08/09/2020   Coagulation defect (HCC) 08/09/2020   Diabetic neuropathy (HCC) 08/09/2020    Past Surgical History:  Procedure Laterality Date   ANGIOPLASTY     legs   BACK SURGERY     5 different times   CARDIAC CATHETERIZATION  2020   REPLACEMENT TOTAL HIP W/  RESURFACING IMPLANTS Right 2018   RIGHT AND LEFT HEART CATH     ROTATOR CUFF REPAIR Right 2016   SPINE SURGERY  2014,2016,2017,2019   toe amputated  09/2018   5th left toe    OB History   No obstetric history on file.      Home Medications    Prior to  Admission medications   Medication Sig Start Date End Date Taking? Authorizing Provider  ALPRAZolam Prudy Feeler) 0.5 MG tablet Take by mouth at bedtime. 02/05/20   [provider]  aspirin EC 81 MG tablet Take 81 mg by mouth daily. Swallow whole.    [provider]  atorvastatin (LIPITOR) 40 MG tablet Take 40 mg by mouth daily. 08/08/19   [provider]  baclofen (LIORESAL) 10 MG tablet Take 10 mg by mouth 3 (three) times daily as needed. 08/16/19   [provider]  candesartan (ATACAND) 4 MG tablet Take 4 mg by mouth daily. 08/13/19   [provider]  ELIQUIS 5 MG TABS tablet Take 5 mg by mouth 2 (two) times daily. 08/08/19   [provider]  furosemide (LASIX) 20 MG tablet Take 20 mg by mouth daily. 08/08/19   [provider]  LINZESS 290 MCG CAPS capsule Take 290 mcg by mouth daily as needed. 07/14/20   [provider]  metFORMIN (GLUCOPHAGE) 500 MG tablet Take 1 tablet by mouth every morning. 07/15/20   [provider]  methimazole (TAPAZOLE) 10 MG tablet Take 1 tablet (10 mg total) by mouth daily. 09/07/20   Shamleffer, Konrad Dolores, MD  metoprolol succinate (TOPROL-XL) 100 MG 24 hr tablet Take 100 mg by mouth daily. 06/12/19   [provider]  mirtazapine (REMERON) 30 MG tablet Take 30 mg by mouth at bedtime. Patient not taking: Reported on 10/14/2020 07/24/20   [provider]  MULTAQ 400 MG tablet Take 400 mg by mouth 2 (two) times daily. Patient not taking: Reported on 10/14/2020 08/13/19   [provider]  oxyCODONE (OXY IR/ROXICODONE) 5 MG immediate release tablet Take 5 mg by mouth 4 (four) times daily as needed. 01/30/20   [provider]  oxyCODONE-acetaminophen (PERCOCET) 10-325 MG tablet SMARTSIG:1 Tablet(s) By Mouth 6 Times Daily 07/18/20   [provider]  pregabalin (LYRICA) 200 MG capsule Take 200 mg by mouth 3 (three) times daily. 01/12/20   [provider]   Sertraline HCl 150 MG CAPS Take 1 capsule by mouth daily. 09/30/20   [provider]  spironolactone (ALDACTONE) 25 MG tablet TAKE 1 TABLET (25 MG TOTAL) BY MOUTH DAILY. 08/23/20   Cantwell, Celeste C, PA-C  TRESIBA FLEXTOUCH 100 UNIT/ML FlexTouch Pen SMARTSIG:50 Unit(s) SUB-Q Daily 08/22/19   [provider]  triamcinolone cream (KENALOG) 0.5 % Apply 1 application topically 2 (two) times daily. 02/09/20   [provider]  VICTOZA 18 MG/3ML SOPN Inject 1.8 mg into the skin daily. Patient not taking: Reported on 10/14/2020 08/08/19   [provider]  XIGDUO XR 11-998 MG TB24 Take 1 tablet by mouth in the morning and at bedtime.  06/03/19   [provider]    Family History Family History  Problem Relation Age of Onset   Hypertension Mother    Hyperlipidemia Mother    Diabetes Mother    Stroke Mother    Clotting disorder Father    Hypertension Father    Hyperlipidemia Father    Hyperlipidemia Sister    Diabetes Brother    Neuropathy Brother     Social History Social History   Tobacco Use   Smoking status: Every Day    Packs/day: 0.50    Years: 30.00    Pack years: 15.00    Types: Cigarettes   Smokeless tobacco: Never  Vaping Use   Vaping Use: Never used  Substance Use Topics   Alcohol use: Not Currently   Drug use: Not Currently     Allergies   Other, Penicillin g, and Trulicity [dulaglutide]   Review of Systems Review of Systems Per HPI  Physical Exam Triage Vital Signs ED Triage Vitals  Enc Vitals Group     BP 11/11/20 1659 (!) 155/58     Pulse Rate 11/11/20 1659 64     Resp 11/11/20 1659 20     Temp 11/11/20 1659 98 F (36.7 C)     Temp Source 11/11/20 1659 Oral     SpO2 11/11/20 1659 99 %     Weight --      Height --      Head Circumference --      Peak Flow --      Pain Score 11/11/20 1658 7     Pain Loc --      Pain Edu? --      Excl. in GC? --    No data found.  Updated Vital Signs BP (!) 155/58 (BP  Location: Left Arm)   Pulse 64   Temp 98 F (36.7 C) (Oral)   Resp 20   SpO2 99%   Visual Acuity Right Eye Distance:   Left Eye Distance:  Bilateral Distance:    Right Eye Near:   Left Eye Near:    Bilateral Near:     Physical Exam Vitals and nursing note reviewed.  Constitutional:      Appearance: Normal appearance. She is not ill-appearing.  HENT:     Head: Atraumatic.  Eyes:     Extraocular Movements: Extraocular movements intact.     Conjunctiva/sclera: Conjunctivae normal.     Pupils: Pupils are equal, round, and reactive to light.  Cardiovascular:     Rate and Rhythm: Normal rate and regular rhythm.     Heart sounds: Normal heart sounds.  Pulmonary:     Effort: Pulmonary effort is normal.     Breath sounds: Normal breath sounds.  Musculoskeletal:        General: Swelling, tenderness and signs of injury present.     Cervical back: Normal range of motion and neck supple.     Comments: Significant swelling right fifth digit, normal range of motion in this joint and significant tenderness palpation over the proximal portion of the joint..  Swelling extending down into fourth and fifth metacarpals additionally.  Skin:    General: Skin is warm and dry.     Findings: Bruising present.  Neurological:     General: No focal deficit present.     Mental Status: She is alert and oriented to person, place, and time.     Motor: No weakness.     Gait: Gait normal.     Comments: Right hand neurovascularly intact  Psychiatric:        Mood and Affect: Mood normal.        Thought Content: Thought content normal.        Judgment: Judgment normal.   UC Treatments / Results  Labs (all labs ordered are listed, but only abnormal results are displayed) Labs Reviewed - No data to display  EKG   Radiology DG Hand Complete Right  Result Date: 11/11/2020 CLINICAL DATA:  Larey Seat and injured right hand last night. EXAM: RIGHT HAND - COMPLETE 3+ VIEW COMPARISON:  None. FINDINGS: There  is a nondisplaced intra-articular fracture involving the base of the fifth proximal phalanx. No other fractures are identified. 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 IMPRESSION: Nondisplaced intra-articular fracture involving the base of the fifth proximal phalanx. Electronically Signed   By: Rudie Meyer M.D.   On: 11/11/2020 17:33    Procedures Procedures (including critical care time)  Medications Ordered in UC Medications  acetaminophen (TYLENOL) tablet 975 mg (has no administration in time range)    Initial Impression / Assessment and Plan / UC Course  I have reviewed the triage vital signs and the nursing notes.  Pertinent labs & imaging results that were available during my care of the patient were reviewed by me and considered in my medical decision making (see chart for details).     No neurologic deficits on exam and asymptomatic in this regard, but did discuss the protocol typically for any sort of head trauma on Eliquis would be to go to the emergency department have a CT scan.  She declines this at this time but knows to go if developing any new symptoms.  Her x-ray of the right hand is positive for a nondisplaced fracture involving the base of the fifth proximal phalanx.  Will place in an ulnar gutter splint and discussed RICE protocol, Tylenol and close orthopedic follow-up.  Information given for this.   Final Clinical Impressions(s) / UC Diagnoses   Final  diagnoses:  Closed fracture of right hand, initial encounter   Discharge Instructions   None    ED Prescriptions   None    PDMP not reviewed this encounter.   Particia Nearing, [place] 11/11/20 1753

## 2020-11-11 NOTE — Progress Notes (Signed)
Orthopedic Tech Progress Note Patient Details:  Carmen Armstrong [birth year] [id number]  Ortho Devices Type of Ortho Device: Ulna gutter splint Ortho Device/Splint Location: Right hand Ortho Device/Splint Interventions: Application   Post Interventions Patient Tolerated: Well Instructions Provided: Care of device  [name] E Farrie Sann 11/11/2020, 6:06 PM

## 2020-11-15 ENCOUNTER — Other Ambulatory Visit: Payer: Self-pay

## 2020-11-15 ENCOUNTER — Encounter: Payer: Self-pay | Admitting: Podiatry

## 2020-11-15 ENCOUNTER — Ambulatory Visit (INDEPENDENT_AMBULATORY_CARE_PROVIDER_SITE_OTHER): Payer: 59 | Admitting: Podiatry

## 2020-11-15 DIAGNOSIS — B351 Tinea unguium: Secondary | ICD-10-CM

## 2020-11-15 DIAGNOSIS — E1142 Type 2 diabetes mellitus with diabetic polyneuropathy: Secondary | ICD-10-CM | POA: Diagnosis not present

## 2020-11-15 DIAGNOSIS — M79675 Pain in left toe(s): Secondary | ICD-10-CM | POA: Diagnosis not present

## 2020-11-15 DIAGNOSIS — D689 Coagulation defect, unspecified: Secondary | ICD-10-CM | POA: Diagnosis not present

## 2020-11-15 DIAGNOSIS — M79674 Pain in right toe(s): Secondary | ICD-10-CM

## 2020-11-15 NOTE — Progress Notes (Signed)
This patient returns to my office for at risk foot care.  This patient requires this care by a professional since this patient will be at risk due to having diabetes, coagulation defect and amputation left foot.    This patient is unable to cut nails herself since the patient cannot reach hiernails.These nails are painful walking and wearing shoes.  This patient presents for at risk foot care today.  General Appearance  Alert, conversant and in no acute stress.  Vascular  Dorsalis pedis and posterior tibial  pulses are weakly  palpable  bilaterally.  Capillary return is within normal limits  bilaterally. Temperature is within normal limits  bilaterally.  Neurologic  Senn-Weinstein monofilament wire test absent  bilaterally. Muscle power within normal limits bilaterally.  Nails Thick disfigured discolored nails with subungual debris  from hallux to fifth toes right foot and first through fourth toenail left foot. No evidence of bacterial infection or drainage bilaterally.  Orthopedic  No limitations of motion  feet .  No crepitus or effusions noted.  No bony pathology or digital deformities noted.  Amputation fifth ray left foot.  Skin  normotropic skin with no porokeratosis noted bilaterally.  No signs of infections or ulcers noted.     Onychomycosis  Pain in right toes  Pain in left toes  Consent was obtained for treatment procedures.   Mechanical debridement of nails 1-5  right and 1-4 left performed with a nail nipper.  Filed with dremel without incident.    Return office visit   3 months                  Told patient to return for periodic foot care and evaluation due to potential at risk complications.   Helane Gunther DPM

## 2020-11-15 NOTE — Progress Notes (Deleted)
Primary Physician/Referring:  [name], MD  Patient ID: Carmen Armstrong, female    DOB: [birth year], 55 y.o.   MRN: [id number]  No chief complaint on file.  HPI:    Carmen Armstrong  is a 55 y.o. with hypertension, hyperlipidemia, type 2 DM, PAD, depression. History of ablation for atrial fibrillation in February and April 2021. She had ulcer of her left fifth toe, with worsening gangrene despite revascularization of right and left lower extremities, required amputation.   Patient has chronic, stable neuropathy in her feet bilaterally, without claudication, nonhealing ulcers or wounds. She is presently on Eliquis and aspirin. She has been unable to tolerate Plavix in the past due to rash.   Patient presents for 102-monthfollow-up of hypertension, PAD, and carotid artery stenosis.  Last office visit patient was stable from cardiovascular, no changes were made.  Office visit patient has had repeat carotid artery duplex, will continue with surveillance every 6 months.***  ***Schedule carotid duplex.   Patient presents for 8-week follow-up of hypertension and results of cardiac testing.  Her last office visit increased spironolactone from 12.5 mg to 25 mg daily, unfortunately she did not have repeat BMP done.  Patient reports she is planning to have blood work done later this week, and therefore will obtain repeat BMP.  She is tolerating increased dose of spironolactone without issue.  She reports home blood pressure readings are well controlled averaging 125/70 mmHg.  She was seen by her internal medicine provider on 04/28/2020, and her blood pressure was under excellent control at 120/70 mmHg.  Notably patient is in significant back pain at this time which is causing her severe discomfort and pain.  She is following with PCP for further management.  Patient denies chest pain, dyspnea, leg edema, orthopnea, PND, palpitations, syncope, near syncope.  She continues to take furosemide 20  mg daily and is tolerating Eliquis without bleeding diathesis.   Past Medical History:  Diagnosis Date   Arrhythmia    Atrial fibrillation (HCC)    CAD (coronary artery disease)    Chronic lower back pain    Diabetes mellitus without complication (HCC)    Eczema    GAD (generalized anxiety disorder)    H/O vitamin D deficiency    Heart failure (HCC)    Hyperlipidemia    Hypertension    Insomnia    [place]ipheral vascular disease) [place]    Past Surgical History:  Procedure Laterality Date   ANGIOPLASTY     legs   BACK SURGERY     5 different times   CARDIAC CATHETERIZATION  2020   REPLACEMENT TOTAL HIP W/  RESURFACING IMPLANTS Right 2018   RIGHT AND LEFT HEART CATH     ROTATOR CUFF REPAIR Right 2016   SPINE SURGERY  2014,2016,2017,2019   toe amputated  09/2018   5th left toe   Family History  Problem Relation Age of Onset   Hypertension Mother    Hyperlipidemia Mother    Diabetes Mother    Stroke Mother    Clotting disorder Father    Hypertension Father    Hyperlipidemia Father    Hyperlipidemia Sister    Diabetes Brother    Neuropathy Brother     Social History   Tobacco Use   Smoking status: Every Day    Packs/day: 0.50    Years: 30.00    Pack years: 15.00    Types: Cigarettes   Smokeless tobacco: Never  Substance Use Topics   Alcohol use:  Not Currently   Marital Status: Widowed   ROS  Review of Systems  Constitutional: Negative for malaise/fatigue and weight gain.  Cardiovascular:  Negative for chest pain, claudication, leg swelling, near-syncope, orthopnea, palpitations, paroxysmal nocturnal dyspnea and syncope.  Respiratory:  Negative for shortness of breath.   Hematologic/Lymphatic: Does not bruise/bleed easily.  Musculoskeletal:  Positive for back pain and joint pain.  Gastrointestinal:  Negative for melena.  Neurological:  Positive for paresthesias (Bilateral feet). Negative for dizziness and weakness.   Objective  There were no vitals  taken for this visit.  Vitals with BMI 11/11/2020 10/14/2020 09/01/2020  Height - '5\' 9"'  '5\' 9"'   Weight - 233 lbs 10 oz 236 lbs  BMI - 09.32 35.57  Systolic 322 025 427  Diastolic 58 67 78  Pulse 64 56 74      Physical Exam Vitals reviewed.  Constitutional:      Appearance: She is obese.  HENT:     Head: Normocephalic and atraumatic.  Neck:     Vascular: Carotid bruit (right > left ) present. No JVD.  Cardiovascular:     Rate and Rhythm: Normal rate and regular rhythm.     Pulses: Intact distal pulses.          Femoral pulses are 2+ on the right side and 2+ on the left side.      Popliteal pulses are 1+ on the right side and 1+ on the left side.       Dorsalis pedis pulses are 0 on the right side and 0 on the left side.       Posterior tibial pulses are 1+ on the right side and 0 on the left side.     Heart sounds: S1 normal and S2 normal. Murmur heard.  Harsh midsystolic murmur is present at the upper right sternal border radiating to the neck.    No gallop.     Comments: Weak/absent pedal pulses, but well perfused warm feet without any open ulcers, wounds, gangrene. Well-healed scar at left fifth toe s/p amputation Pulmonary:     Effort: Pulmonary effort is normal. No respiratory distress.     Breath sounds: No wheezing, rhonchi or rales.  Musculoskeletal:     Right lower leg: No edema.     Left lower leg: No edema.  Skin:    General: Skin is warm and dry.     Capillary Refill: Capillary refill takes less than 2 seconds.     Findings: No erythema, lesion or rash.  Neurological:     Mental Status: She is alert.   Laboratory examination:   Recent Labs    12/17/19 1024 09/07/20 1131  NA 138 138  K 4.6 4.9  CL 103 106  CO2 28 23  GLUCOSE 132* 210*  BUN 9 15  CREATININE 0.74 0.83  CALCIUM 9.4 9.5    CrCl cannot be calculated (Patient's most recent lab result is older than the maximum 21 days allowed.).  CMP Latest Ref Rng & Units 09/07/2020 12/17/2019  Glucose 70 -  99 mg/dL 210(H) 132(H)  BUN 6 - 23 mg/dL 15 9  Creatinine 0.40 - 1.20 mg/dL 0.83 0.74  Sodium 135 - 145 mEq/L 138 138  Potassium 3.5 - 5.1 mEq/L 4.9 4.6  Chloride 96 - 112 mEq/L 106 103  CO2 19 - 32 mEq/L 23 28  Calcium 8.4 - 10.5 mg/dL 9.5 9.4  Total Protein 6.0 - 8.3 g/dL 7.5 7.8  Total Bilirubin 0.2 - 1.2 mg/dL 0.4  0.4  Alkaline Phos 39 - 117 U/L 112 174(H)  AST 0 - 37 U/L 12 13  ALT 0 - 35 U/L 11 12   CBC Latest Ref Rng & Units 09/07/2020 12/17/2019  WBC 4.0 - 10.5 K/uL 5.6 6.5  Hemoglobin 12.0 - 15.0 g/dL 12.8 12.8  Hematocrit 36.0 - 46.0 % 40.9 40.6  Platelets 150.0 - 400.0 K/uL 177.0 236.0    Lipid Panel No results for input(s): CHOL, TRIG, LDLCALC, VLDL, HDL, CHOLHDL, LDLDIRECT in the last 8760 hours.  HEMOGLOBIN A1C No results found for: HGBA1C, MPG TSH Recent Labs    04/28/20 1141 06/23/20 1109 09/07/20 1131  TSH 0.04* 0.08* 2.16     External labs:  01/27/2020: Hemoglobin 12.4, hematocrit 39.9, MCV 82, platelets 222 Total cholesterol 83, triglycerides 68, HDL 32, LDL 36 Glucose 92, BUN 23, creatinine 1.07, GFR 59, sodium 141, potassium 4.5, alk phos 178, CMP otherwise normal TSH 0.022  Allergies   Allergies  Allergen Reactions   Other     Hazelnuts- SOB   Penicillin G     whelps   Trulicity [Dulaglutide]     Large itchy bumps      Medications Prior to Visit:   Outpatient Medications Prior to Visit  Medication Sig Dispense Refill   ALPRAZolam (XANAX) 0.5 MG tablet Take by mouth at bedtime.     aspirin EC 81 MG tablet Take 81 mg by mouth daily. Swallow whole.     atorvastatin (LIPITOR) 40 MG tablet Take 40 mg by mouth daily.     baclofen (LIORESAL) 10 MG tablet Take 10 mg by mouth 3 (three) times daily as needed.     candesartan (ATACAND) 4 MG tablet Take 4 mg by mouth daily.     ELIQUIS 5 MG TABS tablet Take 5 mg by mouth 2 (two) times daily.     furosemide (LASIX) 20 MG tablet Take 20 mg by mouth daily.     LINZESS 290 MCG CAPS capsule Take  290 mcg by mouth daily as needed.     metFORMIN (GLUCOPHAGE) 500 MG tablet Take 1 tablet by mouth every morning.     methimazole (TAPAZOLE) 10 MG tablet Take 1 tablet (10 mg total) by mouth daily. 90 tablet 1   metoprolol succinate (TOPROL-XL) 100 MG 24 hr tablet Take 100 mg by mouth daily.     mirtazapine (REMERON) 30 MG tablet Take 30 mg by mouth at bedtime. (Patient not taking: Reported on 10/14/2020)     MULTAQ 400 MG tablet Take 400 mg by mouth 2 (two) times daily. (Patient not taking: Reported on 10/14/2020)     oxyCODONE (OXY IR/ROXICODONE) 5 MG immediate release tablet Take 5 mg by mouth 4 (four) times daily as needed.     oxyCODONE-acetaminophen (PERCOCET) 10-325 MG tablet SMARTSIG:1 Tablet(s) By Mouth 6 Times Daily     pregabalin (LYRICA) 200 MG capsule Take 200 mg by mouth 3 (three) times daily.     Sertraline HCl 150 MG CAPS Take 1 capsule by mouth daily.     spironolactone (ALDACTONE) 25 MG tablet TAKE 1 TABLET (25 MG TOTAL) BY MOUTH DAILY. 30 tablet 3   TRESIBA FLEXTOUCH 100 UNIT/ML FlexTouch Pen SMARTSIG:50 Unit(s) SUB-Q Daily     triamcinolone cream (KENALOG) 0.5 % Apply 1 application topically 2 (two) times daily.     VICTOZA 18 MG/3ML SOPN Inject 1.8 mg into the skin daily. (Patient not taking: Reported on 10/14/2020)     XIGDUO XR 11-998 MG TB24 Take 1  tablet by mouth in the morning and at bedtime.      No facility-administered medications prior to visit.   Final Medications at End of Visit    No outpatient medications have been marked as taking for the 11/16/20 encounter (Appointment) with Rayetta Pigg, Kaydon Creedon C, PA-C.   Radiology:   No results found.  Cardiac Studies:   Carotid artery duplex 06-21-20:  Stenosis in the right internal carotid artery (minimal). Stenosis in the right external carotid artery (<50%).  Stenosis in the left internal carotid artery (50-69%). Stenosis in the left external carotid artery (<50%).  Antegrade right vertebral artery flow. Antegrade left  vertebral artery flow.  No significant change from 03/31/2020. Follow up in six months is appropriate if clinically indicated.  ABI 09/05/2019:  This exam reveals mildly decreased perfusion of the right lower extremity, noted at the post tibial artery level with monophasic waveform(ABI 0.96).    The right anterior tibial artery shows dampened monophasic waveform suggestive of severe diffuse disease or occlusion.  This exam reveals mildly decreased perfusion of the left lower extremity, noted at the anterior tibial and post tibial artery level (ABI 0.96).   EKG:  ***  EKG 03/22/2020: Sinus rhythm at a rate of 82 bpm, left atrial enlargement.  Normal axis.  Cannot exclude anteroseptal infarct old.  Low voltage complexes.  EKG 09/02/2019: Sinus rhythm 81 bpm. Cannot exclude old anteroseptal infarct.  Assessment   No diagnosis found.    There are no discontinued medications.  No orders of the defined types were placed in this encounter.   Recommendations:   Carmen Armstrong is a 55 y.o.  with hypertension, hyperlipidemia, type 2 DM, PAD, depression. History of ablation for atrial fibrillation in February and April 2021. She had ulcer of her left fifth toe, with worsening gangrene despite revascularization of right and left lower extremities, required amputation.   Patient presents for 73-monthfollow-up of hypertension, PAD, and carotid artery stenosis.  Last office visit patient was stable from cardiovascular, no changes were made.  Office visit patient has had repeat carotid artery duplex, will continue with surveillance every 6 months.***  ***Schedule carotid duplex.   Patient presents for 8-week follow-up of hypertension and results of cardiac testing.  Her last office visit increase spironolactone from 12.5 mg to 25 mg daily. She is tolerating increased dose of spironolactone without issue. She will obtain repeat BMP later this week. Although her blood pressure is elevated in the  office today, it is well controlled at home. Will not make changes to her antihypertensive medications at this time.   Patient reports echocardiogram and heart [place] Will again attempt to obtain records.   Reviewed and discussed with patient regarding results of carotid artery duplex which showed bilateral stenosis, will plan to repeat carotid duplex in 6 months.  Continue aspirin and Lipitor.  PAD: No lifestyle limiting claudication, or critical limb ischemia. Suspect feet pain is due to underlying neuropathy ABI revealed mildly decreased perfusion of the left lower extremity, noted at the anterior tibial and post tibial artery level (ABI 0.96)  Continue aspirin, Lipitor Continue management of hypertension, hyperlipidemia, diabetes, and tobacco use  Patient unable to tolerate Plavix due to rash    Paroxysmal A. Fib: History of prior ablations.   Maintaining sinus rhythm. CHA2DS2VASc score 4, annual stroke risk 5%. Tolerating anticoagulation without bleeding diathesis  Continue Eliquis 5 mg twice daily   Hypertension: Elevated in the office, well  controlled at home.  Continue candesartan, metoprolol succinate, spironolactone, and furosemide. Repeat BMP later this week.    Type 2 diabetes mellitus: Most recent A1c 6.7%.   Will defer further management of diabetes to PCP  Bilateral carotid artery stenosis:  Will repeat carotid duplex in 6 months Continue Asprin and Lipitor   Follow up in 6 months, sooner if needed, for hypertension, pAD, and carotid stenosis.    Alethia Berthold, PA-C 11/15/2020, 11:17 AM Office: [phone]

## 2020-11-16 ENCOUNTER — Ambulatory Visit: Payer: 59 | Admitting: Student

## 2020-11-16 DIAGNOSIS — I10 Essential (primary) hypertension: Secondary | ICD-10-CM

## 2020-11-16 DIAGNOSIS — I6523 Occlusion and stenosis of bilateral carotid arteries: Secondary | ICD-10-CM

## 2020-11-16 DIAGNOSIS — I2510 Atherosclerotic heart disease of native coronary artery without angina pectoris: Secondary | ICD-10-CM

## 2020-11-16 DIAGNOSIS — I739 Peripheral vascular disease, unspecified: Secondary | ICD-10-CM

## 2020-11-17 ENCOUNTER — Ambulatory Visit: Payer: 59 | Admitting: Diagnostic Neuroimaging

## 2020-11-17 DIAGNOSIS — S62616A Displaced fracture of proximal phalanx of right little finger, initial encounter for closed fracture: Secondary | ICD-10-CM | POA: Insufficient documentation

## 2020-11-17 DIAGNOSIS — M79641 Pain in right hand: Secondary | ICD-10-CM | POA: Insufficient documentation

## 2020-11-18 NOTE — Progress Notes (Signed)
Primary Physician/Referring:  [name], MD  Patient ID: Carmen Armstrong, female    DOB: September 01, 1965, 55 y.o.   MRN: [id number]  Chief Complaint  Patient presents with   Asymptomatic bilateral carotid artery stenosis   Hypertension   Follow-up   HPI:    Carmen Armstrong  is a 55 y.o. with hypertension, hyperlipidemia, type 2 DM, PAD, depression. History of ablation for atrial fibrillation in February and April 2021. She had ulcer of her left fifth toe, with worsening gangrene despite revascularization of right and left lower extremities, required amputation.   Patient has chronic, stable neuropathy in her feet bilaterally, without claudication, nonhealing ulcers or wounds. She is presently on Eliquis and aspirin. She has been unable to tolerate Plavix in the past due to rash.   Patient presents for 23-monthfollow-up of hypertension, PAD, and carotid artery stenosis.  Last office visit patient was stable from cardiovascular, no changes were made.  Office visit patient has had repeat carotid artery duplex, will continue with surveillance every 6 months.  Patient previously been [place] recently discontinued this approximately 2 to 3 months ago.  Patient has had no known recurrence of atrial fibrillation since stopping Multaq and wishes not to be on antiarrhythmic therapy at this time.  She does however report mild dyspnea on exertion over the last 2 to 3 weeks.  Reports home blood pressure readings average 125/60s mmHg.  Notably she has not taken her blood pressure medications this morning prior to our office visit.  Denies chest pain, palpitations, syncope, near syncope, dizziness, orthopnea, PND, leg swelling.  She is tolerating anticoagulation without bleeding diathesis.  Past Medical History:  Diagnosis Date   Arrhythmia    Atrial fibrillation (HCC)    CAD (coronary artery disease)    Chronic lower back pain     Diabetes mellitus without complication (HCC)    Eczema    GAD (generalized anxiety disorder)    H/O vitamin D deficiency    Heart failure (HCC)    Hyperlipidemia    Hypertension    Insomnia    [place]    Past Surgical History:  Procedure Laterality Date   ANGIOPLASTY     legs   BACK SURGERY     5 different times   CARDIAC CATHETERIZATION  2020   REPLACEMENT TOTAL HIP W/  RESURFACING IMPLANTS Right 2018   RIGHT AND LEFT HEART CATH     ROTATOR CUFF REPAIR Right 2016   SPINE SURGERY  2014,2016,2017,2019   toe amputated  09/2018   5th left toe   Family History  Problem Relation Age of Onset   Hypertension Mother    Hyperlipidemia Mother    Diabetes Mother    Stroke Mother    Clotting disorder Father    Hypertension Father    Hyperlipidemia Father    Hyperlipidemia Sister    Diabetes Brother    Neuropathy Brother     Social History   Tobacco Use   Smoking status: Every Day    Packs/day: 0.50    Years: 30.00    Pack years: 15.00    Types: Cigarettes   Smokeless tobacco: Never  Substance Use Topics   Alcohol use: Not Currently   Marital Status: Widowed   ROS  Review of Systems  Cardiovascular:  Positive for dyspnea on exertion. Negative for chest pain, claudication, leg swelling, near-syncope, orthopnea, palpitations, paroxysmal nocturnal dyspnea and syncope.  Hematologic/Lymphatic: Does not  bruise/bleed easily.  Musculoskeletal:  Positive for back pain and joint pain.  Gastrointestinal:  Negative for melena.  Neurological:  Positive for paresthesias (Bilateral feet). Negative for dizziness and weakness.   Objective  Blood pressure (!) 142/74, pulse 76, temperature 98 F (36.7 C), height $RemoveBe'5\' 9"'SBiCNqEoa$  (1.753 m), weight 228 lb 9.6 oz (103.7 kg), SpO2 99 %.  Vitals with BMI 11/19/2020 11/11/2020 10/14/2020  Height $Remov'5\' 9"'zrtMfS$  - $'5\' 9"'E$   Weight 228 lbs 10 oz - 233 lbs 10 oz  BMI 38.18 - 40.37  Systolic 543 606 770  Diastolic 74 58 67  Pulse 76 64  56      Physical Exam Vitals reviewed.  Constitutional:      Appearance: She is obese.  Neck:     Vascular: Carotid bruit (right > left ) present. No JVD.  Cardiovascular:     Rate and Rhythm: Normal rate and regular rhythm.     Pulses: Intact distal pulses.          Femoral pulses are 2+ on the right side and 2+ on the left side.      Popliteal pulses are 1+ on the right side and 1+ on the left side.       Dorsalis pedis pulses are 0 on the right side and 0 on the left side.       Posterior tibial pulses are 1+ on the right side and 0 on the left side.     Heart sounds: S1 normal and S2 normal. Murmur heard.  Harsh midsystolic murmur is present at the upper right sternal border radiating to the neck.    No gallop.     Comments: Weak/absent pedal pulses, but well perfused warm feet without any open ulcers, wounds, gangrene. Well-healed scar at left fifth toe s/p amputation Pulmonary:     Effort: Pulmonary effort is normal. No respiratory distress.     Breath sounds: No wheezing, rhonchi or rales.  Musculoskeletal:     Right lower leg: No edema.     Left lower leg: No edema.  Neurological:     Mental Status: She is alert.   Laboratory examination:   Recent Labs    12/17/19 1024 09/07/20 1131  NA 138 138  K 4.6 4.9  CL 103 106  CO2 28 23  GLUCOSE 132* 210*  BUN 9 15  CREATININE 0.74 0.83  CALCIUM 9.4 9.5   CrCl cannot be calculated (Patient's most recent lab result is older than the maximum 21 days allowed.).  CMP Latest Ref Rng & Units 09/07/2020 12/17/2019  Glucose 70 - 99 mg/dL 210(H) 132(H)  BUN 6 - 23 mg/dL 15 9  Creatinine 0.40 - 1.20 mg/dL 0.83 0.74  Sodium 135 - 145 mEq/L 138 138  Potassium 3.5 - 5.1 mEq/L 4.9 4.6  Chloride 96 - 112 mEq/L 106 103  CO2 19 - 32 mEq/L 23 28  Calcium 8.4 - 10.5 mg/dL 9.5 9.4  Total Protein 6.0 - 8.3 g/dL 7.5 7.8  Total Bilirubin 0.2 - 1.2 mg/dL 0.4 0.4  Alkaline Phos 39 - 117 U/L 112 174(H)  AST 0 - 37 U/L 12 13  ALT 0 - 35  U/L 11 12   CBC Latest Ref Rng & Units 09/07/2020 12/17/2019  WBC 4.0 - 10.5 K/uL 5.6 6.5  Hemoglobin 12.0 - 15.0 g/dL 12.8 12.8  Hematocrit 36.0 - 46.0 % 40.9 40.6  Platelets 150.0 - 400.0 K/uL 177.0 236.0    Lipid Panel No results for input(s): CHOL,  TRIG, LDLCALC, VLDL, HDL, CHOLHDL, LDLDIRECT in the last 8760 hours.  HEMOGLOBIN A1C No results found for: HGBA1C, MPG TSH Recent Labs    04/28/20 1141 06/23/20 1109 09/07/20 1131  TSH 0.04* 0.08* 2.16   External labs:  01/27/2020: Hemoglobin 12.4, hematocrit 39.9, MCV 82, platelets 222 Total cholesterol 83, triglycerides 68, HDL 32, LDL 36 Glucose 92, BUN 23, creatinine 1.07, GFR 59, sodium 141, potassium 4.5, alk phos 178, CMP otherwise normal TSH 0.022  Allergies   Allergies  Allergen Reactions   Other     Hazelnuts- SOB   Penicillin G     whelps   Trulicity [Dulaglutide]     Large itchy bumps    Medications Prior to Visit:   Outpatient Medications Prior to Visit  Medication Sig Dispense Refill   ALPRAZolam (XANAX) 0.5 MG tablet Take by mouth at bedtime.     aspirin EC 81 MG tablet Take 81 mg by mouth daily. Swallow whole.     atorvastatin (LIPITOR) 40 MG tablet Take 40 mg by mouth daily.     baclofen (LIORESAL) 10 MG tablet Take 10 mg by mouth 3 (three) times daily as needed.     candesartan (ATACAND) 4 MG tablet Take 4 mg by mouth daily.     ELIQUIS 5 MG TABS tablet Take 5 mg by mouth 2 (two) times daily.     furosemide (LASIX) 20 MG tablet Take 20 mg by mouth daily.     LINZESS 290 MCG CAPS capsule Take 290 mcg by mouth daily as needed.     metFORMIN (GLUCOPHAGE) 500 MG tablet Take 1 tablet by mouth every morning.     methimazole (TAPAZOLE) 10 MG tablet Take 1 tablet (10 mg total) by mouth daily. 90 tablet 1   metoprolol succinate (TOPROL-XL) 100 MG 24 hr tablet Take 100 mg by mouth daily.     morphine (MS CONTIN) 30 MG 12 hr tablet Take 30 mg by mouth every 12 (twelve) hours.     oxyCODONE-acetaminophen  (PERCOCET) 10-325 MG tablet SMARTSIG:1 Tablet(s) By Mouth 6 Times Daily     OZEMPIC, 0.25 OR 0.5 MG/DOSE, 2 MG/1.5ML SOPN SMARTSIG:0.25 Milligram(s) SUB-Q Once a Week     pregabalin (LYRICA) 200 MG capsule Take 200 mg by mouth 3 (three) times daily.     Sertraline HCl 150 MG CAPS Take 1 capsule by mouth daily.     spironolactone (ALDACTONE) 25 MG tablet TAKE 1 TABLET (25 MG TOTAL) BY MOUTH DAILY. 30 tablet 3   TRESIBA FLEXTOUCH 100 UNIT/ML FlexTouch Pen SMARTSIG:50 Unit(s) SUB-Q Daily     triamcinolone cream (KENALOG) 0.5 % Apply 1 application topically 2 (two) times daily.     mirtazapine (REMERON) 30 MG tablet Take 30 mg by mouth at bedtime. (Patient not taking: Reported on 10/14/2020)     MULTAQ 400 MG tablet Take 400 mg by mouth 2 (two) times daily. (Patient not taking: Reported on 10/14/2020)     oxyCODONE (OXY IR/ROXICODONE) 5 MG immediate release tablet Take 5 mg by mouth 4 (four) times daily as needed.     VICTOZA 18 MG/3ML SOPN Inject 1.8 mg into the skin daily. (Patient not taking: Reported on 10/14/2020)     XIGDUO XR 11-998 MG TB24 Take 1 tablet by mouth in the morning and at bedtime.      No facility-administered medications prior to visit.   Final Medications at End of Visit    Current Meds  Medication Sig   ALPRAZolam (XANAX) 0.5 MG tablet  Take by mouth at bedtime.   aspirin EC 81 MG tablet Take 81 mg by mouth daily. Swallow whole.   atorvastatin (LIPITOR) 40 MG tablet Take 40 mg by mouth daily.   baclofen (LIORESAL) 10 MG tablet Take 10 mg by mouth 3 (three) times daily as needed.   candesartan (ATACAND) 4 MG tablet Take 4 mg by mouth daily.   ELIQUIS 5 MG TABS tablet Take 5 mg by mouth 2 (two) times daily.   furosemide (LASIX) 20 MG tablet Take 20 mg by mouth daily.   LINZESS 290 MCG CAPS capsule Take 290 mcg by mouth daily as needed.   metFORMIN (GLUCOPHAGE) 500 MG tablet Take 1 tablet by mouth every morning.   methimazole (TAPAZOLE) 10 MG tablet Take 1 tablet (10 mg total)  by mouth daily.   metoprolol succinate (TOPROL-XL) 100 MG 24 hr tablet Take 100 mg by mouth daily.   morphine (MS CONTIN) 30 MG 12 hr tablet Take 30 mg by mouth every 12 (twelve) hours.   oxyCODONE-acetaminophen (PERCOCET) 10-325 MG tablet SMARTSIG:1 Tablet(s) By Mouth 6 Times Daily   OZEMPIC, 0.25 OR 0.5 MG/DOSE, 2 MG/1.5ML SOPN SMARTSIG:0.25 Milligram(s) SUB-Q Once a Week   pregabalin (LYRICA) 200 MG capsule Take 200 mg by mouth 3 (three) times daily.   Sertraline HCl 150 MG CAPS Take 1 capsule by mouth daily.   spironolactone (ALDACTONE) 25 MG tablet TAKE 1 TABLET (25 MG TOTAL) BY MOUTH DAILY.   TRESIBA FLEXTOUCH 100 UNIT/ML FlexTouch Pen SMARTSIG:50 Unit(s) SUB-Q Daily   triamcinolone cream (KENALOG) 0.5 % Apply 1 application topically 2 (two) times daily.   Radiology:   No results found.  Cardiac Studies:   Carotid artery duplex 06-23-2020:  Stenosis in the right internal carotid artery (minimal). Stenosis in the right external carotid artery (<50%).  Stenosis in the left internal carotid artery (50-69%). Stenosis in the left external carotid artery (<50%).  Antegrade right vertebral artery flow. Antegrade left vertebral artery flow.  No significant change from 03/31/2020. Follow up in six months is appropriate if clinically indicated.  ABI 09/05/2019:  This exam reveals mildly decreased perfusion of the right lower extremity, noted at the post tibial artery level with monophasic waveform(ABI 0.96).    The right anterior tibial artery shows dampened monophasic waveform suggestive of severe diffuse disease or occlusion.  This exam reveals mildly decreased perfusion of the left lower extremity, noted at the anterior tibial and post tibial artery level (ABI 0.96).   EKG:  11/19/2020: Sinus rhythm at a rate of 75 bpm.  Normal axis.  No evidence of ischemia or underlying injury pattern.  Compared to EKG 03/22/2020, no left atrial enlargement.  EKG 03/22/2020: Sinus rhythm at a rate of 82  bpm, left atrial enlargement.  Normal axis.  Cannot exclude anteroseptal infarct old.  Low voltage complexes.  EKG 09/02/2019: Sinus rhythm 81 bpm. Cannot exclude old anteroseptal infarct.  Assessment     ICD-10-CM   1. Asymptomatic bilateral carotid artery stenosis  I65.23 EKG 12-Lead    2. Essential hypertension  I10 EKG 12-Lead    3. PVD (peripheral vascular disease) (HCC)  I73.9     4. Precordial pain  R07.2 CT CORONARY MORPH W/CTA COR W/SCORE W/CA W/CM &/OR WO/CM    5. Dyspnea on exertion  R06.09 PCV ECHOCARDIOGRAM COMPLETE       Medications Discontinued During This Encounter  Medication Reason   mirtazapine (REMERON) 30 MG tablet Error   MULTAQ 400 MG tablet Error   oxyCODONE (  OXY IR/ROXICODONE) 5 MG immediate release tablet Error   VICTOZA 18 MG/3ML SOPN Error   XIGDUO XR 11-998 MG TB24 Error    No orders of the defined types were placed in this encounter.   Recommendations:   Carmen Armstrong is a 55 y.o.  with hypertension, hyperlipidemia, type 2 DM, PAD, depression. History of ablation for atrial fibrillation in February and April 2021. She had ulcer of her left fifth toe, with worsening gangrene despite revascularization of right and left lower extremities, required amputation.   Patient presents for 34-monthfollow-up of hypertension, PAD, and carotid artery stenosis.  Last office visit patient was stable from cardiovascular, no changes were made.  Since last office visit patient has had repeat carotid artery duplex, will continue with surveillance every 6 months.  Reviewed and discussed with patient regarding carotid artery duplex, details above.  Given patient's dyspnea on exertion and multiple cardiac risk factors will obtain echocardiogram as well as coronary CTA.  Patient reports echocardiogram and heart [place] has not been able to obtain records. She reports she has not had stenting  done, therefore she is candidate for coronary CTA. Advised patient to take additional dose of metoprolol the morning of her procedure.   PAD: ABI revealed mildly decreased perfusion of the left lower extremity, noted at the anterior tibial and post tibial artery level (ABI 0.96)  Continue aspirin, Lipitor Patient unable to tolerate Plavix due to rash    Paroxysmal A. Fib: History of prior ablations.   Maintaining sinus rhythm. CHA2DS2VASc score 4, annual stroke risk 5%. Tolerating anticoagulation without bleeding diathesis  Continue Eliquis 5 mg twice daily   Hypertension: Elevated in the office, well controlled at home.  Continue candesartan, metoprolol succinate, spironolactone, and furosemide.  Bilateral carotid artery stenosis:  Will repeat carotid duplex in 6 months Continue Asprin and Lipitor   Follow up in 6 months, sooner if needed, for hypertension, pAD, and carotid stenosis.    CAlethia Berthold PA-C 11/22/2020, 1:51 PM Office: [phone]

## 2020-11-19 ENCOUNTER — Encounter: Payer: Self-pay | Admitting: Student

## 2020-11-19 ENCOUNTER — Other Ambulatory Visit: Payer: Self-pay

## 2020-11-19 ENCOUNTER — Ambulatory Visit: Payer: 59 | Admitting: Student

## 2020-11-19 VITALS — BP 142/74 | HR 76 | Temp 98.0°F | Ht 69.0 in | Wt 228.6 lb

## 2020-11-19 DIAGNOSIS — R072 Precordial pain: Secondary | ICD-10-CM

## 2020-11-19 DIAGNOSIS — I739 Peripheral vascular disease, unspecified: Secondary | ICD-10-CM

## 2020-11-19 DIAGNOSIS — R0609 Other forms of dyspnea: Secondary | ICD-10-CM

## 2020-11-19 DIAGNOSIS — I10 Essential (primary) hypertension: Secondary | ICD-10-CM

## 2020-11-19 DIAGNOSIS — I6523 Occlusion and stenosis of bilateral carotid arteries: Secondary | ICD-10-CM

## 2020-11-22 ENCOUNTER — Encounter: Payer: Self-pay | Admitting: Student

## 2020-11-24 ENCOUNTER — Telehealth: Payer: Self-pay

## 2020-11-24 NOTE — Telephone Encounter (Signed)
LVM for pt to call me back to schedule sleep study  

## 2020-11-26 ENCOUNTER — Other Ambulatory Visit: Payer: Self-pay | Admitting: Student

## 2020-11-26 DIAGNOSIS — R072 Precordial pain: Secondary | ICD-10-CM

## 2020-12-06 ENCOUNTER — Telehealth (HOSPITAL_COMMUNITY): Payer: Self-pay | Admitting: Emergency Medicine

## 2020-12-06 ENCOUNTER — Telehealth: Payer: Self-pay

## 2020-12-06 LAB — BASIC METABOLIC PANEL
BUN/Creatinine Ratio: 15 (ref 9–23)
BUN: 9 mg/dL (ref 6–24)
CO2: 29 mmol/L (ref 20–29)
Calcium: 9.4 mg/dL (ref 8.7–10.2)
Chloride: 102 mmol/L (ref 96–106)
Creatinine, Ser: 0.61 mg/dL (ref 0.57–1.00)
Glucose: 190 mg/dL — ABNORMAL HIGH (ref 70–99)
Potassium: 4.6 mmol/L (ref 3.5–5.2)
Sodium: 138 mmol/L (ref 134–144)
eGFR: 105 mL/min/{1.73_m2} (ref >59)

## 2020-12-06 NOTE — Telephone Encounter (Signed)
Attempted to call patient regarding upcoming cardiac CT appointment. °Left message on voicemail with name and callback number °Xayvion Shirah RN Navigator Cardiac Imaging °[facility] °[phone] Office °[phone] Cell ° °

## 2020-12-06 NOTE — Telephone Encounter (Signed)
We have attempted to call the patient 2 times to schedule sleep study. Patient has been unavailable at the phone numbers we have on file and has not returned our calls. If patient calls back we will schedule them for their sleep study. ° °

## 2020-12-07 ENCOUNTER — Telehealth (HOSPITAL_COMMUNITY): Payer: Self-pay | Admitting: Emergency Medicine

## 2020-12-07 NOTE — Telephone Encounter (Signed)
Reaching out to patient to offer assistance regarding upcoming cardiac imaging study; pt verbalizes understanding of appt date/time, parking situation and where to check in, pre-test NPO status and medications ordered, and verified current allergies; name and call back number provided for further questions should they arise Rockwell Alexandria RN Navigator Cardiac Imaging [facility] [phone] office [phone] cell  Holding diuretics,  100mg  metoprolol 2 hr prior to scan Denies iv isues Some claustro

## 2020-12-08 ENCOUNTER — Other Ambulatory Visit: Payer: Self-pay

## 2020-12-08 ENCOUNTER — Ambulatory Visit (HOSPITAL_COMMUNITY)
Admission: RE | Admit: 2020-12-08 | Discharge: 2020-12-08 | Disposition: A | Discharge status: Home / Self Care | Payer: 59 | Source: Ambulatory Visit | Attending: Family Medicine | Admitting: Family Medicine

## 2020-12-08 ENCOUNTER — Ambulatory Visit: Payer: 59

## 2020-12-08 DIAGNOSIS — R072 Precordial pain: Secondary | ICD-10-CM | POA: Insufficient documentation

## 2020-12-08 DIAGNOSIS — R0609 Other forms of dyspnea: Secondary | ICD-10-CM

## 2020-12-08 IMAGING — CT CT HEART MORP W/ CTA COR W/ SCORE W/ CA W/CM &/OR W/O CM
4 of 7 series · 8 of 20 positions shown, 9 images · IV contrast (omnipaque)
Comparison: None.
COMPARISON: None.

Addendum:
EXAM:
OVER-READ INTERPRETATION  CT CHEST

The following report is an over-read performed by radiologist Dr.
CHIO [REDACTED] on [DATE]. This
over-read does not include interpretation of cardiac or coronary
anatomy or pathology. The coronary CTA interpretation by the
cardiologist is attached.
HISTORY: Chest pain/anginal equiv, ECGs and troponins normal
Cardiac/Coronary  CT
TECHNIQUE: The patient was scanned on a Siemens Force scanner.
PROTOCOL: A 120 kV prospective scan was triggered in the descending thoracic
aorta at 111 HU's. Axial non-contrast 3 mm slices were carried out
through the heart. The data set was analyzed on a dedicated work
station and scored using the Agatson method. Gantry rotation speed
was 250 msecs and collimation was .6 mm. Patient was given 5mg of IV
lopressor and 5mg of IV diltiazem and 0.8 mg of sl NTG was given.
The 3D data set was reconstructed in 10% intervals of the 0-90 % of
the R-R cycle. Diastolic phases were analyzed on a dedicated work
station using MPR, MIP and VRT modes. The patient received 95mL
OMNIPAQUE IOHEXOL 350 MG/ML SOLN of contrast.

[Series 6: ts diast · axial · 0.39mm/px · z∈[-202,-166]mm · 2 of 274 slices shown, 3 images]
[im 92/274  vessel]
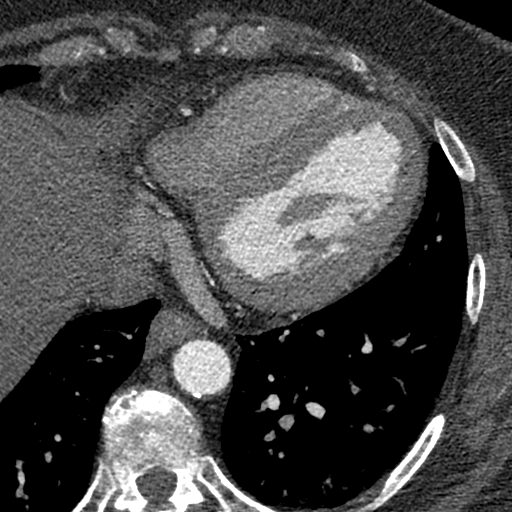
[im 92/274  lung]
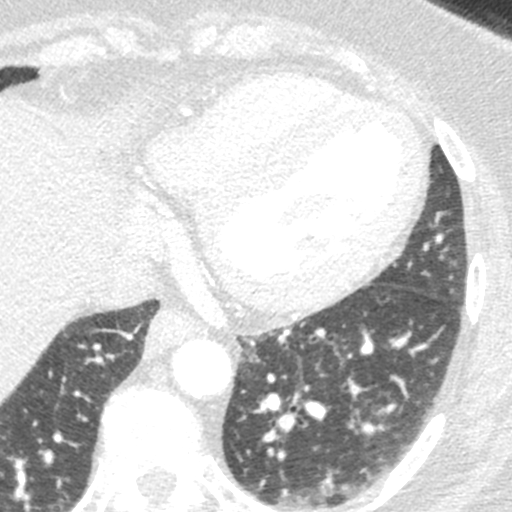
[im 183/274  vessel]
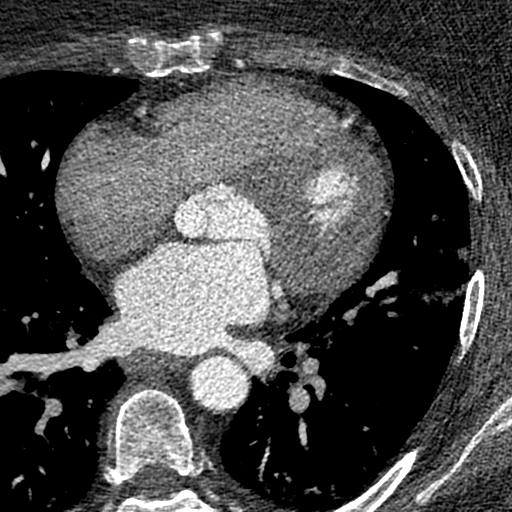

[Series 7: ts syst · axial · 0.39mm/px · z∈[-202,-166]mm · 2 of 274 slices shown]
[im 92/274  vessel]
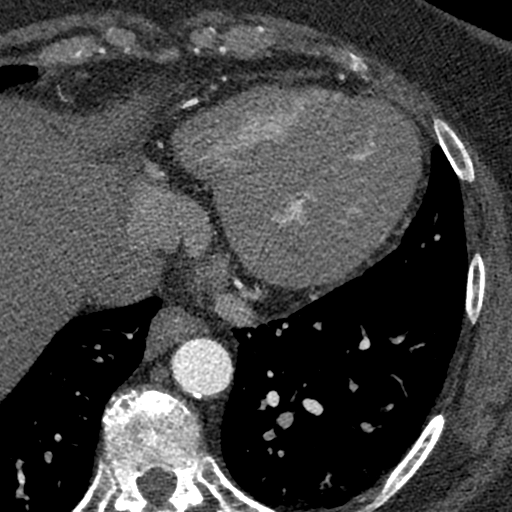
[im 183/274  vessel]
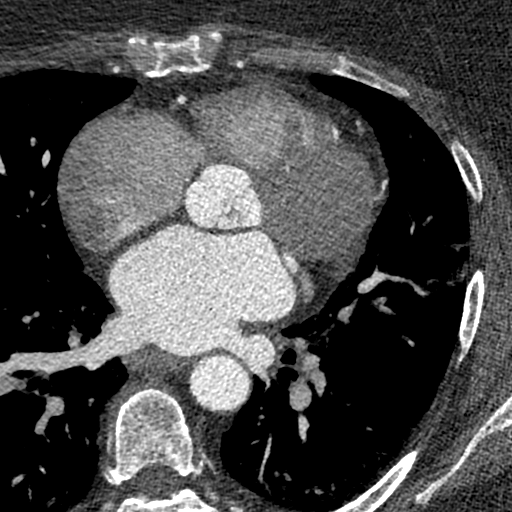

[Series 8: best diast · axial · 0.39mm/px · z∈[-202,-166]mm · 2 of 274 slices shown]
[im 92/274  vessel]
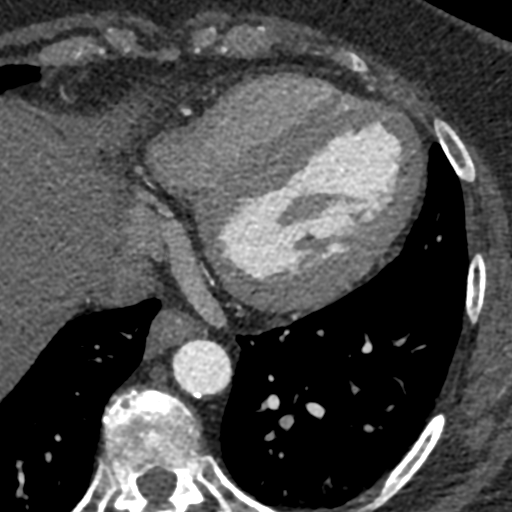
[im 183/274  vessel]
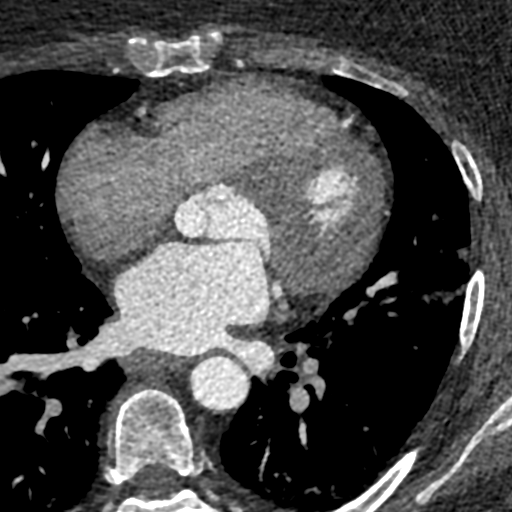

[Series 9: best syst · axial · 0.39mm/px · z∈[-202,-166]mm · 2 of 274 slices shown]
[im 92/274  vessel]
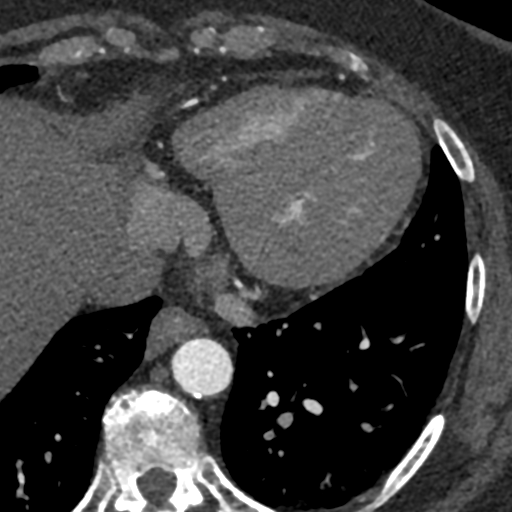
[im 183/274  vessel]
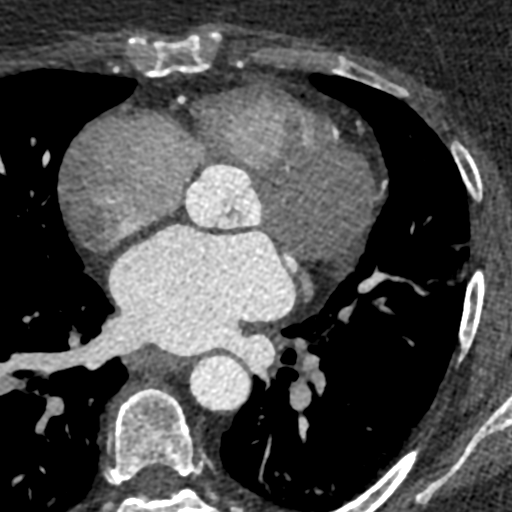

[8 of 20 positions shown; findings below may reference images not displayed]

FINDINGS: Vascular: Heart is normal size.  Visualized aorta normal caliber.

Mediastinum/Nodes: No adenopathy

Lungs/Pleura: No confluent airspace opacities or effusions.

Upper Abdomen: Imaging into the upper abdomen demonstrates no acute
findings.

Musculoskeletal: Chest wall soft tissues are unremarkable. No acute
bony abnormality.
IMPRESSION: No acute or significant extracardiac abnormality.
FINDINGS: Image quality: poor.

Artifact: Moderate (signal-to-noise (obese)).

Coronary artery calcification score:

Left main: 124

Left anterior descending artery: 227

Left circumflex artery:

Right coronary artery:

Total coronary calcium score of 457, places the patient at the 99th
percentile for age and sex matched control.

Coronary arteries: Normal coronary origins.  Left dominance.

Left Main Coronary Artery: The left main is a normal caliber vessel
with a normal take off from the left coronary cusp that bifurcates
to form a left anterior descending artery and a left circumflex
artery. Minimal stenosis (0-24%) at the ostial left main due to
calcified plaque.

Left Anterior Descending Coronary Artery: Normal caliber vessel,
reaches the apex, gives off 2 patent diagonal branches.

Mild stenosis (25-49%) at the distal left main / ostial LAD due to
calcified plaque. Moderate stenosis (50-69%) at the mid LAD due to
calcified plaque. Mid to distal LAD is patent.

Left Circumflex Artery: Normal caliber vessel, dominant, travels
within the atrioventricular groove, gives off 2 patent obtuse
marginal branches. The LCX is patent with diffuse luminal
irregularities no evidence of significant stenosis.

Right Coronary Artery: The RCA is non-dominant with normal take off
from the right coronary cusp. Mild stenosis (25-49%) at proximal RCA
due to calcified plaque. Mid to distal RCA is patent.

Left Atrium: Grossly normal in size with no left atrial appendage
filling defect.

Left Ventricle: Grossly normal in size. Presence of left ventricular
hypertrophy. There is no abnormal filling defect.

Pulmonary arteries: Normal in size without proximal filling defect.

Pulmonary veins: Normal pulmonary venous drainage.

Aorta: Normal size, 27.4 mm at the mid ascending aorta (level of the
PA bifurcation) measured double oblique. Aortic atherosclerosis. No
dissection.

Pericardium: Normal thickness with no significant effusion or
calcium present.

Cardiac valves: The aortic valve is trileaflet without significant
calcification. The mitral valve is normal structure without
significant calcification.

Extra-cardiac findings: See attached radiology report for
non-cardiac structures.
IMPRESSION: 1. Total coronary calcium score of 457. This was 99th percentile for
age and sex matched control.

2. Normal coronary origin with left dominance.

3. CAD-RADS = 3.

Minimal stenosis (0-24%) at the ostial left main due to calcified
plaque.

Mild stenosis (25-49%) at distal left main/ostial LAD due to
calcified plaque. Moderate stenosis (50-69%) at mid LAD due to
calcified plaque. Mid to distal LAD is patent.

LCX is patent with diffuse luminal irregularities no evidence of
significant stenosis.

Mild stenosis (25-49%) at proximal RCA due to calcified plaque. Mid
to distal RCA is patent.

4.  Aortic Atherosclerosis.

RECOMMENDATIONS:

Consider symptom-guided anti-ischemic pharmacotherapy as well as
risk factor modification per guideline directed care for secondary
prevention.

Due to sub-optimal study quality additional analysis by CT-FFR is
not available.

Consider invasive angiography if patient continues to have anginal
discomfort despite uptitration of anti-anginal therapy. Clinical
correlation is required.

*** End of Addendum ***
EXAM:
OVER-READ INTERPRETATION  CT CHEST

The following report is an over-read performed by radiologist Dr.
CHIO [REDACTED] on [DATE]. This
over-read does not include interpretation of cardiac or coronary
anatomy or pathology. The coronary CTA interpretation by the
cardiologist is attached.
FINDINGS: Vascular: Heart is normal size.  Visualized aorta normal caliber.

Mediastinum/Nodes: No adenopathy

Lungs/Pleura: No confluent airspace opacities or effusions.

Upper Abdomen: Imaging into the upper abdomen demonstrates no acute
findings.

Musculoskeletal: Chest wall soft tissues are unremarkable. No acute
bony abnormality.
IMPRESSION: No acute or significant extracardiac abnormality.

## 2020-12-08 MED ORDER — IOHEXOL 350 MG/ML SOLN
95.0000 mL | Freq: Once | INTRAVENOUS | Status: AC | PRN
  Administered 2020-12-08: 95 mL via INTRAVENOUS

## 2020-12-08 MED ORDER — NITROGLYCERIN 0.4 MG SL SUBL: 0.8000 mg | SUBLINGUAL_TABLET | Freq: Once | SUBLINGUAL | Status: AC

## 2020-12-08 MED ORDER — DILTIAZEM HCL 25 MG/5ML IV SOLN
INTRAVENOUS | Status: AC
  Administered 2020-12-08: 5 mg via INTRAVENOUS
  Filled 2020-12-08: qty 5

## 2020-12-08 MED ORDER — DILTIAZEM HCL 25 MG/5ML IV SOLN: 5.0000 mg | INTRAVENOUS | Status: DC | PRN

## 2020-12-08 MED ORDER — METOPROLOL TARTRATE 5 MG/5ML IV SOLN
5.0000 mg | INTRAVENOUS | Status: DC | PRN
  Administered 2020-12-08: 5 mg via INTRAVENOUS

## 2020-12-08 MED ORDER — METOPROLOL TARTRATE 5 MG/5ML IV SOLN
INTRAVENOUS | Status: AC
  Filled 2020-12-08: qty 5

## 2020-12-08 MED ORDER — NITROGLYCERIN 0.4 MG SL SUBL
SUBLINGUAL_TABLET | SUBLINGUAL | Status: AC
  Administered 2020-12-08: 0.8 mg via SUBLINGUAL
  Filled 2020-12-08: qty 2

## 2020-12-09 NOTE — Progress Notes (Signed)
Called patient, Carmen Armstrong, LMAM.

## 2020-12-10 NOTE — Progress Notes (Signed)
2nd attempt : Called patient, NA, LMAM

## 2020-12-14 NOTE — Progress Notes (Signed)
Called pt, no answer. Left vm requesting call back?

## 2020-12-15 ENCOUNTER — Other Ambulatory Visit: Payer: Self-pay | Admitting: Student

## 2020-12-15 NOTE — Progress Notes (Signed)
Called pt no answer, left a vm to return the call back

## 2020-12-21 NOTE — Progress Notes (Signed)
Called pt no answer, left a vm  Can you all please call her to see if she can come in sooner like in 4 weeks.

## 2021-01-03 ENCOUNTER — Ambulatory Visit (INDEPENDENT_AMBULATORY_CARE_PROVIDER_SITE_OTHER): Payer: 59 | Admitting: Internal Medicine

## 2021-01-03 ENCOUNTER — Other Ambulatory Visit: Payer: Self-pay

## 2021-01-03 VITALS — BP 134/62 | HR 84 | Ht 69.0 in | Wt 226.6 lb

## 2021-01-03 DIAGNOSIS — E0500 Thyrotoxicosis with diffuse goiter without thyrotoxic crisis or storm: Secondary | ICD-10-CM

## 2021-01-03 DIAGNOSIS — E0590 Thyrotoxicosis, unspecified without thyrotoxic crisis or storm: Secondary | ICD-10-CM | POA: Diagnosis not present

## 2021-01-03 DIAGNOSIS — Z794 Long term (current) use of insulin: Secondary | ICD-10-CM | POA: Diagnosis not present

## 2021-01-03 DIAGNOSIS — R739 Hyperglycemia, unspecified: Secondary | ICD-10-CM | POA: Diagnosis not present

## 2021-01-03 DIAGNOSIS — E1165 Type 2 diabetes mellitus with hyperglycemia: Secondary | ICD-10-CM | POA: Diagnosis not present

## 2021-01-03 LAB — MICROALBUMIN / CREATININE URINE RATIO
Creatinine,U: 119.9 mg/dL
Microalb Creat Ratio: 8.2 mg/g (ref 0.0–30.0)
Microalb, Ur: 9.9 mg/dL — ABNORMAL HIGH (ref 0.0–1.9)

## 2021-01-03 LAB — POCT GLYCOSYLATED HEMOGLOBIN (HGB A1C): Hemoglobin A1C: 8.2 % — AB (ref 4.0–5.6)

## 2021-01-03 LAB — GLUCOSE, POCT (MANUAL RESULT ENTRY): POC Glucose: 315 mg/dl — AB (ref 70–99)

## 2021-01-03 LAB — T4, FREE: Free T4: 0.76 ng/dL (ref 0.60–1.60)

## 2021-01-03 LAB — TSH: TSH: 0.43 u[IU]/mL (ref 0.35–5.50)

## 2021-01-03 MED ORDER — METFORMIN HCL ER 500 MG PO TB24: 500.0000 mg | ORAL_TABLET | Freq: Two times a day (BID) | ORAL | 3 refills | Status: DC

## 2021-01-03 NOTE — Patient Instructions (Signed)
Increase Metformin 500 mg to 1 tablet twice a day  Continue Ozempic 1 mg weekly  Continue Tresiba 50 units daily      HOW TO TREAT LOW BLOOD SUGARS (Blood sugar LESS THAN 70 MG/DL) Please follow the RULE OF 15 for the treatment of hypoglycemia treatment (when your (blood sugars are less than 70 mg/dL)   STEP 1: Take 15 grams of carbohydrates when your blood sugar is low, which includes:  3-4 GLUCOSE TABS  OR 3-4 OZ OF JUICE OR REGULAR SODA OR ONE TUBE OF GLUCOSE GEL    STEP 2: RECHECK blood sugar in 15 MINUTES STEP 3: If your blood sugar is still low at the 15 minute recheck --> then, go back to STEP 1 and treat AGAIN with another 15 grams of carbohydrates.

## 2021-01-03 NOTE — Progress Notes (Signed)
Name: Carmen Armstrong  MRN/ DOB: [id number], 03/29/1965    Age/ Sex: 55 y.o., female     PCP: [name], MD   Reason for Endocrinology Evaluation: Hyperthyroidism     Initial Endocrinology Clinic Visit: 11/05/2019    PATIENT IDENTIFIER: Carmen Armstrong is a 55 y.o., female with a past medical history of HTN, T2DM, PAF , CHF and dyslipidemia. She has followed with [facility] since 11/05/2019 for consultative assistance with management of her Hyperthyroidism.   HISTORICAL SUMMARY:   She was diagnosed with hyperthyroidism in 10/2018 during routine workup . Two months prior to her presentation she was noted with dry eyes. Has occasional double vision, and is under the care of an ophthalmologist.     Pt on [place]  Since 2020. S/P ablation . Has not been on amiodarone.   Methimazole started 10/2019   Paternal grandmother with thyroid disease  SUBJECTIVE:     Today (01/03/2021):  Carmen Armstrong is here for hyperthyroidism.   She has noted with wight loss  Denies abdominal , or vomiting  Denies local neck symptoms  Sees  An eye specialist at  [facility] - dry eyes     Methimazole 10 mg , 1 tabs daily- continues to take 1.5 tabs daily   The medical assistant accidentally checked glucose on the pt and was noted with a BG of 315 mg/dL. Her last A1c was in 05/2020 at 7.6 %   She checks on her glucose 4x a week.   She is on metformin 500 mg 1 tablet daily  Ozempic 0.5 mg weekly  Tresiba 50 units daily    HISTORY:  Past Medical History:  Past Medical History:  Diagnosis Date   Arrhythmia    Atrial fibrillation ([place])    CAD (coronary artery disease)    Chronic lower back pain    Diabetes mellitus without complication (HCC)    Eczema    GAD (generalized anxiety disorder)    H/O vitamin D deficiency    Heart failure (HCC)    Hyperlipidemia    Hypertension    Insomnia    PVD (peripheral vascular disease) ([place])    Past  Surgical History:  Past Surgical History:  Procedure Laterality Date   ANGIOPLASTY     legs   BACK SURGERY     5 different times   CARDIAC CATHETERIZATION  2020   REPLACEMENT TOTAL HIP W/  RESURFACING IMPLANTS Right 2018   RIGHT AND LEFT HEART CATH     ROTATOR CUFF REPAIR Right 2016   SPINE SURGERY  2014,2016,2017,2019   toe amputated  09/2018   5th left toe   Social History:  reports that she has been smoking cigarettes. She has a 15.00 pack-year smoking history. She has never used smokeless tobacco. She reports that she does not currently use alcohol. She reports that she does not currently use drugs. Family History:  Family History  Problem Relation Age of Onset   Hypertension Mother    Hyperlipidemia Mother    Diabetes Mother    Stroke Mother    Clotting disorder Father    Hypertension Father    Hyperlipidemia Father    Hyperlipidemia Sister    Diabetes Brother    Neuropathy Brother      HOME MEDICATIONS: Allergies as of 01/03/2021       Reactions   Other    Hazelnuts- SOB   Penicillin G    whelps   Trulicity [dulaglutide]  Large itchy bumps        Medication List        Accurate as of January 03, 2021  1:10 PM. If you have any questions, ask your nurse or doctor.          STOP taking these medications    metFORMIN 500 MG tablet Commonly known as: GLUCOPHAGE Replaced by: metFORMIN 500 MG 24 hr tablet Stopped by: Dorita Sciara, MD       TAKE these medications    ALPRAZolam 0.5 MG tablet Commonly known as: XANAX Take by mouth at bedtime.   aspirin EC 81 MG tablet Take 81 mg by mouth daily. Swallow whole.   atorvastatin 40 MG tablet Commonly known as: LIPITOR Take 40 mg by mouth daily.   baclofen 10 MG tablet Commonly known as: LIORESAL Take 10 mg by mouth 3 (three) times daily as needed.   candesartan 4 MG tablet Commonly known as: ATACAND Take 4 mg by mouth daily.   Eliquis 5 MG Tabs tablet Generic drug:  apixaban Take 5 mg by mouth 2 (two) times daily.   furosemide 20 MG tablet Commonly known as: LASIX Take 20 mg by mouth daily.   Linzess 290 MCG Caps capsule Generic drug: linaclotide Take 290 mcg by mouth daily as needed.   metFORMIN 500 MG 24 hr tablet Commonly known as: GLUCOPHAGE-XR Take 1 tablet (500 mg total) by mouth in the morning and at bedtime. Replaces: metFORMIN 500 MG tablet Started by: Dorita Sciara, MD   methimazole 10 MG tablet Commonly known as: TAPAZOLE Take 1 tablet (10 mg total) by mouth daily.   metoprolol succinate 100 MG 24 hr tablet Commonly known as: TOPROL-XL Take 100 mg by mouth daily.   morphine 30 MG 12 hr tablet Commonly known as: MS CONTIN Take 30 mg by mouth every 12 (twelve) hours.   oxyCODONE-acetaminophen 10-325 MG tablet Commonly known as: PERCOCET SMARTSIG:1 Tablet(s) By Mouth 6 Times Daily   Ozempic (1 MG/DOSE) 4 MG/3ML Sopn Generic drug: Semaglutide (1 MG/DOSE) Inject 1 mg into the skin. What changed: Another medication with the same name was removed. Continue taking this medication, and follow the directions you see here. Changed by: Dorita Sciara, MD   pregabalin 200 MG capsule Commonly known as: LYRICA Take 200 mg by mouth 3 (three) times daily.   Sertraline HCl 150 MG Caps Take 1 capsule by mouth daily.   spironolactone 25 MG tablet Commonly known as: ALDACTONE TAKE 1 TABLET (25 MG TOTAL) BY MOUTH DAILY.   Tyler Aas FlexTouch 100 UNIT/ML FlexTouch Pen Generic drug: insulin degludec SMARTSIG:50 Unit(s) SUB-Q Daily   triamcinolone cream 0.5 % Commonly known as: KENALOG Apply 1 application topically 2 (two) times daily.          OBJECTIVE:   PHYSICAL EXAM: VS: BP 134/62 (BP Location: Left Arm, Patient Position: Sitting, Cuff Size: Normal)   Pulse 84   Ht _0  (1.753 m)   Wt 226 lb 9.6 oz (102.8 kg)   SpO2 96%   BMI 33.46 kg/m    EXAM: General: Pt appears well and is in NAD  Eyes:  External eye exam normal without stare, lid lag or exophthalmos.  EOM intact.  PERRL.  Neck: General: Supple without adenopathy. Thyroid: Thyroid size normal.Left thyroid asymmetry noted   Lungs: Clear with good BS bilat with no rales, rhonchi, or wheezes  Heart: Auscultation: RRR.  Abdomen: Normoactive bowel sounds, soft, nontender, without masses or organomegaly palpable  Extremities:  BL LE: no  pretibial edema   Mental Status: Judgment, insight: Intact Mood and affect: No depression, anxiety, or agitation     DATA REVIEWED:    Latest Reference Range & Units 01/03/21 11:50  MICROALB/CREAT RATIO 0.0 - 30.0 mg/g 8.2    Latest Reference Range & Units 01/03/21 11:39  POC Glucose 70 - 99 mg/dl 315 !  Hemoglobin A1C 4.0 - 5.6 % 8.2 !     Latest Reference Range & Units 12/06/20 11:34  Sodium 134 - 144 mmol/L 138  Potassium 3.5 - 5.2 mmol/L 4.6  Chloride 96 - 106 mmol/L 102  CO2 20 - 29 mmol/L 29  Glucose 70 - 99 mg/dL 190 (H)  BUN 6 - 24 mg/dL 9  Creatinine 0.57 - 1.00 mg/dL 0.61  Calcium 8.7 - 10.2 mg/dL 9.4  BUN/Creatinine Ratio 9 - 23  15  eGFR >59 mL/min/1.73 105     Results for NEKIA, MAXHAM (MRN [id number]) as of 01/26/2020 13:34  Ref. Range 12/17/2019 10:24  TRAB Latest Ref Range: <=2.00 IU/L 30.55 (H)    ASSESSMENT / PLAN / RECOMMENDATIONS:   Hyperthyroidism Secondary to Graves' Disease:    - Pt is clinically euthyroid  - No local neck symptoms - Tolerating methimazole without side effects  -Patient continued to take methimazole 1.5 tabs daily despite my advised to reduce it 1 tablet in 08/2020, her TFT's are normal today and I am concerned that there are compliance  issues  Medications   Continue Methimazole 10 mg , 1.5  tab daily     2. Graves' Disease:   No extrathyroidal manifestations of  Graves' Disease.    3. T2DM, Poorly Controlled : A1c 8.2 %    - In Office Bg 315 mg/dl , pt ate a piece of cake for Breakfast  - Counseled about low  carb diet and avoiding sugar sweetened beverages   - Will increase Metformin as below    Medication   Increase Metformin 500 mg to 1 tablet twice a day  Continue Ozempic 1 mg weekly  Continue Tresiba 50 units daily    F/U in 4 months     Signed electronically by: Mack Guise, MD  [facility]  [facility] [address] [place], [place] [address] Phone: [phone] FAX: [phone]      CC: [name], MD [address] [place] [address] Phone: [phone]  Fax: [phone]   Return to Endocrinology clinic as below: Future Appointments  Date Time Provider [place]  01/11/2021  9:15 AM Alethia Berthold, PA-C [facility] None  02/16/2021 11:15 AM Gardiner Barefoot, DPM [facility]  05/04/2021 10:50 AM Jaclynne Baldo, Melanie Crazier, MD [facility] None

## 2021-01-04 ENCOUNTER — Encounter: Payer: Self-pay | Admitting: Internal Medicine

## 2021-01-04 MED ORDER — METHIMAZOLE 10 MG PO TABS: 15.0000 mg | ORAL_TABLET | Freq: Every day | ORAL | 1 refills | Status: DC

## 2021-01-10 NOTE — Progress Notes (Deleted)
Primary Physician/Referring:  [name], MD  Patient ID: Carmen Armstrong, female    DOB: 1965-09-16, 55 y.o.   MRN: [id number]  No chief complaint on file.  HPI:    Carmen Armstrong  is a 55 y.o. with hypertension, hyperlipidemia, type 2 DM, PAD, depression. History of ablation for atrial fibrillation in February and April 2021. She had ulcer of her left fifth toe, with worsening gangrene despite revascularization of right and left lower extremities, required amputation.   Patient has chronic, stable neuropathy in her feet bilaterally, without claudication, nonhealing ulcers or wounds. She is presently on Eliquis and aspirin. She has been unable to tolerate Plavix in the past due to rash.  Patient had previously been on Multaq, however she discontinued this as she does not wish to be on antiarrhythmic therapy, she has maintained sinus rhythm since without known recurrence of atrial fibrillation.  Patient was seen in our office 11/19/2020 with continued chest pain, therefore ordered coronary CTA which unfortunately was suboptimal quality but note coronary calcium score 457 placing patient in the 99th percentile as well as multivessel minimal to mild stenosis and aortic atherosclerosis. Medical therapy and uptitration of anti-anginal therapy recommended. Patient now presents for follow up. ***  ***  Patient presents for 15-monthfollow-up of hypertension, PAD, and carotid artery stenosis.  Last office visit patient was stable from cardiovascular, no changes were made.  Office visit patient has had repeat carotid artery duplex, will continue with surveillance every 6 months.  Patient previously been prescribed Multaq by cardiologist in [place] she recently discontinued this approximately 2 to 3 months ago.  Patient has had no known recurrence of atrial fibrillation since stopping Multaq and wishes not to be on antiarrhythmic therapy at this time.  She does however report  mild dyspnea on exertion over the last 2 to 3 weeks.  Reports home blood pressure readings average 125/60s mmHg.  Notably she has not taken her blood pressure medications this morning prior to our office visit.  Denies chest pain, palpitations, syncope, near syncope, dizziness, orthopnea, PND, leg swelling.  She is tolerating anticoagulation without bleeding diathesis.  Past Medical History:  Diagnosis Date   Arrhythmia    Atrial fibrillation (HCC)    CAD (coronary artery disease)    Chronic lower back pain    Diabetes mellitus without complication (HCC)    Eczema    GAD (generalized anxiety disorder)    H/O vitamin D deficiency    Heart failure (HCC)    Hyperlipidemia    Hypertension    Insomnia    PVD (peripheral vascular disease) (HCC)    Past Surgical History:  Procedure Laterality Date   ANGIOPLASTY     legs   BACK SURGERY     5 different times   CARDIAC CATHETERIZATION  2020   REPLACEMENT TOTAL HIP W/  RESURFACING IMPLANTS Right 2018   RIGHT AND LEFT HEART CATH     ROTATOR CUFF REPAIR Right 2016   SPINE SURGERY  2014,2016,2017,2019   toe amputated  09/2018   5th left toe   Family History  Problem Relation Age of Onset   Hypertension Mother    Hyperlipidemia Mother    Diabetes Mother    Stroke Mother    Clotting disorder Father    Hypertension Father    Hyperlipidemia Father    Hyperlipidemia Sister    Diabetes Brother    Neuropathy Brother     Social History   Tobacco Use   Smoking  status: Every Day    Packs/day: 0.50    Years: 30.00    Pack years: 15.00    Types: Cigarettes   Smokeless tobacco: Never  Substance Use Topics   Alcohol use: Not Currently   Marital Status: Widowed   ROS  Review of Systems  Cardiovascular:  Positive for dyspnea on exertion. Negative for chest pain, claudication, leg swelling, near-syncope, orthopnea, palpitations, paroxysmal nocturnal dyspnea and syncope.  Hematologic/Lymphatic: Does not bruise/bleed easily.   Musculoskeletal:  Positive for back pain and joint pain.  Gastrointestinal:  Negative for melena.  Neurological:  Positive for paresthesias (Bilateral feet). Negative for dizziness and weakness.   Objective  There were no vitals taken for this visit.  Vitals with BMI 01/03/2021 12/08/2020 12/08/2020  Height _0  - -  Weight 226 lbs 10 oz - -  BMI 87.68 - -  Systolic 115 726 203  Diastolic 62 69 72  Pulse 84 75 70      Physical Exam Vitals reviewed.  Constitutional:      Appearance: She is obese.  Neck:     Vascular: Carotid bruit (right > left ) present. No JVD.  Cardiovascular:     Rate and Rhythm: Normal rate and regular rhythm.     Pulses: Intact distal pulses.          Femoral pulses are 2+ on the right side and 2+ on the left side.      Popliteal pulses are 1+ on the right side and 1+ on the left side.       Dorsalis pedis pulses are 0 on the right side and 0 on the left side.       Posterior tibial pulses are 1+ on the right side and 0 on the left side.     Heart sounds: S1 normal and S2 normal. Murmur heard.  Harsh midsystolic murmur is present at the upper right sternal border radiating to the neck.    No gallop.     Comments: Weak/absent pedal pulses, but well perfused warm feet without any open ulcers, wounds, gangrene. Well-healed scar at left fifth toe s/p amputation Pulmonary:     Effort: Pulmonary effort is normal. No respiratory distress.     Breath sounds: No wheezing, rhonchi or rales.  Musculoskeletal:     Right lower leg: No edema.     Left lower leg: No edema.  Neurological:     Mental Status: She is alert.   Laboratory examination:   Recent Labs    09/07/20 1131 12/06/20 1134  NA 138 138  K 4.9 4.6  CL 106 102  CO2 23 29  GLUCOSE 210* 190*  BUN 15 9  CREATININE 0.83 0.61  CALCIUM 9.5 9.4    CrCl cannot be calculated (Patient's most recent lab result is older than the maximum 21 days allowed.).  CMP Latest Ref Rng & Units 12/06/2020  09/07/2020 12/17/2019  Glucose 70 - 99 mg/dL 190(H) 210(H) 132(H)  BUN 6 - 24 mg/dL _1 Creatinine 0.57 - 1.00 mg/dL 0.61 0.83 0.74  Sodium 134 - 144 mmol/L 138 138 138  Potassium 3.5 - 5.2 mmol/L 4.6 4.9 4.6  Chloride 96 - 106 mmol/L 102 106 103  CO2 20 - 29 mmol/L _2 Calcium 8.7 - 10.2 mg/dL 9.4 9.5 9.4  Total Protein 6.0 - 8.3 g/dL - 7.5 7.8  Total Bilirubin 0.2 - 1.2 mg/dL - 0.4 0.4  Alkaline Phos 39 - 117 U/L - 112  174(H)  AST 0 - 37 U/L - 12 13  ALT 0 - 35 U/L - 11 12   CBC Latest Ref Rng & Units 09/07/2020 12/17/2019  WBC 4.0 - 10.5 K/uL 5.6 6.5  Hemoglobin 12.0 - 15.0 g/dL 12.8 12.8  Hematocrit 36.0 - 46.0 % 40.9 40.6  Platelets 150.0 - 400.0 K/uL 177.0 236.0    Lipid Panel No results for input(s): CHOL, TRIG, LDLCALC, VLDL, HDL, CHOLHDL, LDLDIRECT in the last 8760 hours.  HEMOGLOBIN A1C Lab Results  Component Value Date   HGBA1C 8.2 (A) 01/03/2021   TSH Recent Labs    06/23/20 1109 09/07/20 1131 01/03/21 1150  TSH 0.08* 2.16 0.43    External labs:  01/27/2020: Hemoglobin 12.4, hematocrit 39.9, MCV 82, platelets 222 Total cholesterol 83, triglycerides 68, HDL 32, LDL 36 Glucose 92, BUN 23, creatinine 1.07, GFR 59, sodium 141, potassium 4.5, alk phos 178, CMP otherwise normal TSH 0.022  Allergies   Allergies  Allergen Reactions   Other     Hazelnuts- SOB   Penicillin G     whelps   Trulicity [Dulaglutide]     Large itchy bumps    Medications Prior to Visit:   Outpatient Medications Prior to Visit  Medication Sig Dispense Refill   ALPRAZolam (XANAX) 0.5 MG tablet Take by mouth at bedtime.     aspirin EC 81 MG tablet Take 81 mg by mouth daily. Swallow whole.     atorvastatin (LIPITOR) 40 MG tablet Take 40 mg by mouth daily.     baclofen (LIORESAL) 10 MG tablet Take 10 mg by mouth 3 (three) times daily as needed.     candesartan (ATACAND) 4 MG tablet Take 4 mg by mouth daily.     ELIQUIS 5 MG TABS tablet Take 5 mg by mouth 2 (two) times  daily.     furosemide (LASIX) 20 MG tablet Take 20 mg by mouth daily.     LINZESS 290 MCG CAPS capsule Take 290 mcg by mouth daily as needed.     metFORMIN (GLUCOPHAGE-XR) 500 MG 24 hr tablet Take 1 tablet (500 mg total) by mouth in the morning and at bedtime. 180 tablet 3   methimazole (TAPAZOLE) 10 MG tablet Take 1.5 tablets (15 mg total) by mouth daily. 135 tablet 1   metoprolol succinate (TOPROL-XL) 100 MG 24 hr tablet Take 100 mg by mouth daily.     morphine (MS CONTIN) 30 MG 12 hr tablet Take 30 mg by mouth every 12 (twelve) hours.     oxyCODONE-acetaminophen (PERCOCET) 10-325 MG tablet SMARTSIG:1 Tablet(s) By Mouth 6 Times Daily     pregabalin (LYRICA) 200 MG capsule Take 200 mg by mouth 3 (three) times daily.     Semaglutide, 1 MG/DOSE, (OZEMPIC, 1 MG/DOSE,) 4 MG/3ML SOPN Inject 1 mg into the skin.     Sertraline HCl 150 MG CAPS Take 1 capsule by mouth daily.     spironolactone (ALDACTONE) 25 MG tablet TAKE 1 TABLET (25 MG TOTAL) BY MOUTH DAILY. 30 tablet 3   TRESIBA FLEXTOUCH 100 UNIT/ML FlexTouch Pen SMARTSIG:50 Unit(s) SUB-Q Daily     triamcinolone cream (KENALOG) 0.5 % Apply 1 application topically 2 (two) times daily.     No facility-administered medications prior to visit.   Final Medications at End of Visit    No outpatient medications have been marked as taking for the 01/11/21 encounter (Appointment) with Rayetta Pigg, Alece Koppel C, PA-C.   Radiology:   No results found.  Cardiac Studies:  Coronary  CTA 12/08/2020: 1. Total coronary calcium score of 457. This was 99th percentile for age and sex matched control. 2. Normal coronary origin with left dominance. 3. CAD-RADS = 3. Minimal stenosis (0-24%) at the ostial left main due to calcified plaque. Mild stenosis (25-49%) at distal left main/ostial LAD due to calcified plaque. Moderate stenosis (50-69%) at mid LAD due to calcified plaque. Mid to distal LAD is patent. LCX is patent with diffuse luminal irregularities no evidence  of significant stenosis. Mild stenosis (25-49%) at proximal RCA due to calcified plaque. Mid to distal RCA is patent. 4.  Aortic Atherosclerosis.   RECOMMENDATIONS: Consider symptom-guided anti-ischemic pharmacotherapy as well as risk factor modification per guideline directed care for secondary prevention. Due to sub-optimal study quality additional analysis by CT-FFR is not available. Consider invasive angiography if patient continues to have anginal discomfort despite uptitration of anti-anginal therapy. Clinical correlation is required.   Carotid artery duplex 06/03/2020:  Stenosis in the right internal carotid artery (minimal). Stenosis in the right external carotid artery (<50%).  Stenosis in the left internal carotid artery (50-69%). Stenosis in the left external carotid artery (<50%).  Antegrade right vertebral artery flow. Antegrade left vertebral artery flow.  No significant change from 03/31/2020. Follow up in six months is appropriate if clinically indicated.  ABI 09/05/2019:  This exam reveals mildly decreased perfusion of the right lower extremity, noted at the post tibial artery level with monophasic waveform(ABI 0.96).    The right anterior tibial artery shows dampened monophasic waveform suggestive of severe diffuse disease or occlusion.  This exam reveals mildly decreased perfusion of the left lower extremity, noted at the anterior tibial and post tibial artery level (ABI 0.96).   EKG:  11/19/2020: Sinus rhythm at a rate of 75 bpm.  Normal axis.  No evidence of ischemia or underlying injury pattern.  Compared to EKG 03/22/2020, no left atrial enlargement.  EKG 03/22/2020: Sinus rhythm at a rate of 82 bpm, left atrial enlargement.  Normal axis.  Cannot exclude anteroseptal infarct old.  Low voltage complexes.  EKG 09/02/2019: Sinus rhythm 81 bpm. Cannot exclude old anteroseptal infarct.  Assessment   No diagnosis found.    There are no discontinued medications.   No  orders of the defined types were placed in this encounter.   Recommendations:   Carmen Armstrong is a 55 y.o.  with hypertension, hyperlipidemia, type 2 DM, PAD, depression. History of ablation for atrial fibrillation in February and April 2021. She had ulcer of her left fifth toe, with worsening gangrene despite revascularization of right and left lower extremities, required amputation.   Patient has chronic, stable neuropathy in her feet bilaterally, without claudication, nonhealing ulcers or wounds. She is presently on Eliquis and aspirin. She has been unable to tolerate Plavix in the past due to rash.  Patient had previously been on Multaq, however she discontinued this as she does not wish to be on antiarrhythmic therapy, she has maintained sinus rhythm since without known recurrence of atrial fibrillation.  Patient was seen in our office 11/19/2020 with continued chest pain, therefore ordered coronary CTA which unfortunately was suboptimal quality but note coronary calcium score 457 placing patient in the 99th percentile as well as multivessel minimal to mild stenosis and aortic atherosclerosis. Medical therapy and uptitration of anti-anginal therapy recommended. Patient now presents for follow up. ***  ***  PAD: ABI revealed mildly decreased perfusion of the left lower extremity, noted at the anterior tibial and post tibial artery level (ABI 0.96)  Continue aspirin, Lipitor Patient unable to tolerate Plavix due to rash    Paroxysmal A. Fib: History of prior ablations.   Maintaining sinus rhythm. CHA2DS2VASc score 4, annual stroke risk 5%. Tolerating anticoagulation without bleeding diathesis  Continue Eliquis 5 mg twice daily   Hypertension: Elevated in the office, well controlled at home.  Continue candesartan, metoprolol succinate, spironolactone, and furosemide.  Bilateral carotid artery stenosis:  Will repeat carotid duplex in 6 months Continue Asprin and Lipitor    Follow up in 6 months, sooner if needed, for hypertension, pAD, and carotid stenosis.    Alethia Berthold, PA-C 01/10/2021, 9:50 AM Office: [phone]

## 2021-01-11 ENCOUNTER — Other Ambulatory Visit: Payer: Self-pay

## 2021-01-11 ENCOUNTER — Encounter: Payer: Self-pay | Admitting: Student

## 2021-01-11 ENCOUNTER — Ambulatory Visit: Payer: 59 | Admitting: Student

## 2021-01-11 VITALS — BP 116/47 | HR 77 | Temp 98.4°F | Resp 17 | Ht 69.0 in | Wt 232.0 lb

## 2021-01-11 DIAGNOSIS — I2510 Atherosclerotic heart disease of native coronary artery without angina pectoris: Secondary | ICD-10-CM

## 2021-01-11 DIAGNOSIS — I10 Essential (primary) hypertension: Secondary | ICD-10-CM

## 2021-01-11 DIAGNOSIS — I4891 Unspecified atrial fibrillation: Secondary | ICD-10-CM

## 2021-01-11 DIAGNOSIS — I739 Peripheral vascular disease, unspecified: Secondary | ICD-10-CM

## 2021-01-11 NOTE — Progress Notes (Signed)
Primary Physician/Referring:  [name], MD  Patient ID: Carmen Armstrong, female    DOB: Sep 01, 1965, 55 y.o.   MRN: [id number]  Chief Complaint  Patient presents with   Results   Follow-up    3 month   DOE   HPI:    Carmen Armstrong  is a 55 y.o. with hypertension, hyperlipidemia, type 2 DM, PAD, depression. History of ablation for atrial fibrillation in February and April 2021. She had ulcer of her left fifth toe, with worsening gangrene despite revascularization of right and left lower extremities, required amputation.   Patient has chronic, stable neuropathy in her feet bilaterally, without claudication, nonhealing ulcers or wounds. She is presently on Eliquis and aspirin. She has been unable to tolerate Plavix in the past due to rash.  Patient has had no known recurrence of atrial fibrillation since last office visit and does not wish to be on antiarrhythmic therapy.  Notably she had previously been on Multaq prescribed by cardiologist in [place].  Patient does continue to have dyspnea on exertion which is unchanged compared to last office visit. Denies chest pain, palpitations, syncope, near syncope, dizziness, orthopnea, PND, leg swelling.  She is tolerating anticoagulation without bleeding diathesis.  Past Medical History:  Diagnosis Date   Arrhythmia    Atrial fibrillation (HCC)    CAD (coronary artery disease)    Chronic lower back pain    Diabetes mellitus without complication (HCC)    Eczema    GAD (generalized anxiety disorder)    H/O vitamin D deficiency    Heart failure (HCC)    Hyperlipidemia    Hypertension    Insomnia    PVD (peripheral vascular disease) ([place])    Past Surgical History:  Procedure Laterality Date   ANGIOPLASTY     legs   BACK SURGERY     5 different times   CARDIAC CATHETERIZATION  2020   REPLACEMENT TOTAL HIP W/  RESURFACING IMPLANTS Right 2018   RIGHT AND LEFT HEART CATH     ROTATOR CUFF REPAIR Right 2016    SPINE SURGERY  2014,2016,2017,2019   toe amputated  09/2018   5th left toe   Family History  Problem Relation Age of Onset   Hypertension Mother    Hyperlipidemia Mother    Diabetes Mother    Stroke Mother    Clotting disorder Father    Hypertension Father    Hyperlipidemia Father    Hyperlipidemia Sister    Diabetes Brother    Neuropathy Brother     Social History   Tobacco Use   Smoking status: Every Day    Packs/day: 0.50    Years: 30.00    Pack years: 15.00    Types: Cigarettes   Smokeless tobacco: Never  Substance Use Topics   Alcohol use: Not Currently   Marital Status: Widowed   ROS  Review of Systems  Cardiovascular:  Positive for dyspnea on exertion (stbale). Negative for chest pain, claudication, leg swelling, near-syncope, orthopnea, palpitations, paroxysmal nocturnal dyspnea and syncope.  Hematologic/Lymphatic: Does not bruise/bleed easily.  Musculoskeletal:  Positive for back pain and joint pain.  Gastrointestinal:  Negative for melena.  Neurological:  Positive for paresthesias (Bilateral feet). Negative for dizziness and weakness.   Objective  Blood pressure (!) 116/47, pulse 77, temperature 98.4 F (36.9 C), temperature source Temporal, resp. rate 17, height 5' 9" (1.753 m), weight 232 lb (105.2 kg), SpO2 94 %.  Vitals with BMI 01/11/2021 01/03/2021 12/08/2020  Height 5' 9"  5' 9" -  Weight 232 lbs 226 lbs 10 oz -  BMI 22.63 33.54 -  Systolic 562 563 893  Diastolic 47 62 69  Pulse 77 84 75      Physical Exam Vitals reviewed.  Constitutional:      Appearance: She is obese.  Neck:     Vascular: Carotid bruit (right > left ) present. No JVD.  Cardiovascular:     Rate and Rhythm: Normal rate and regular rhythm.     Pulses: Intact distal pulses.          Femoral pulses are 2+ on the right side and 2+ on the left side.      Popliteal pulses are 1+ on the right side and 1+ on the left side.       Dorsalis pedis pulses are 0 on the right side and 0  on the left side.       Posterior tibial pulses are 1+ on the right side and 0 on the left side.     Heart sounds: S1 normal and S2 normal. Murmur heard.  Harsh midsystolic murmur is present at the upper right sternal border radiating to the neck.    No gallop.     Comments: Weak/absent pedal pulses, but well perfused warm feet without any open ulcers, wounds, gangrene. Well-healed scar at left fifth toe s/p amputation Pulmonary:     Effort: Pulmonary effort is normal. No respiratory distress.     Breath sounds: No wheezing, rhonchi or rales.  Musculoskeletal:     Right lower leg: No edema.     Left lower leg: No edema.  Neurological:     Mental Status: She is alert.   Laboratory examination:   Recent Labs    09/07/20 1131 12/06/20 1134  NA 138 138  K 4.9 4.6  CL 106 102  CO2 23 29  GLUCOSE 210* 190*  BUN 15 9  CREATININE 0.83 0.61  CALCIUM 9.5 9.4   CrCl cannot be calculated (Patient's most recent lab result is older than the maximum 21 days allowed.).  CMP Latest Ref Rng & Units 12/06/2020 09/07/2020 12/17/2019  Glucose 70 - 99 mg/dL 190(H) 210(H) 132(H)  BUN 6 - 24 mg/dL _0 Creatinine 0.57 - 1.00 mg/dL 0.61 0.83 0.74  Sodium 134 - 144 mmol/L 138 138 138  Potassium 3.5 - 5.2 mmol/L 4.6 4.9 4.6  Chloride 96 - 106 mmol/L 102 106 103  CO2 20 - 29 mmol/L _1 Calcium 8.7 - 10.2 mg/dL 9.4 9.5 9.4  Total Protein 6.0 - 8.3 g/dL - 7.5 7.8  Total Bilirubin 0.2 - 1.2 mg/dL - 0.4 0.4  Alkaline Phos 39 - 117 U/L - 112 174(H)  AST 0 - 37 U/L - 12 13  ALT 0 - 35 U/L - 11 12   CBC Latest Ref Rng & Units 09/07/2020 12/17/2019  WBC 4.0 - 10.5 K/uL 5.6 6.5  Hemoglobin 12.0 - 15.0 g/dL 12.8 12.8  Hematocrit 36.0 - 46.0 % 40.9 40.6  Platelets 150.0 - 400.0 K/uL 177.0 236.0    Lipid Panel No results for input(s): CHOL, TRIG, LDLCALC, VLDL, HDL, CHOLHDL, LDLDIRECT in the last 8760 hours.  HEMOGLOBIN A1C Lab Results  Component Value Date   HGBA1C 8.2 (A) 01/03/2021    TSH Recent Labs    06/23/20 1109 09/07/20 1131 01/03/21 1150  TSH 0.08* 2.16 0.43   External labs:  01/27/2020: Hemoglobin 12.4, hematocrit 39.9, MCV 82, platelets 222 Total  cholesterol 83, triglycerides 68, HDL 32, LDL 36 Glucose 92, BUN 23, creatinine 1.07, GFR 59, sodium 141, potassium 4.5, alk phos 178, CMP otherwise normal TSH 0.022  Allergies   Allergies  Allergen Reactions   Other     Hazelnuts- SOB   Penicillin G     whelps   Trulicity [Dulaglutide]     Large itchy bumps    Medications Prior to Visit:   Outpatient Medications Prior to Visit  Medication Sig Dispense Refill   ALPRAZolam (XANAX) 0.5 MG tablet Take by mouth at bedtime.     atorvastatin (LIPITOR) 40 MG tablet Take 40 mg by mouth daily.     baclofen (LIORESAL) 10 MG tablet Take 10 mg by mouth 3 (three) times daily as needed.     buprenorphine (SUBUTEX) 8 MG SUBL SL tablet Place 8 mg under the tongue in the morning, at noon, and at bedtime.     candesartan (ATACAND) 4 MG tablet Take 4 mg by mouth daily.     ELIQUIS 5 MG TABS tablet Take 5 mg by mouth 2 (two) times daily.     LINZESS 290 MCG CAPS capsule Take 290 mcg by mouth daily as needed.     metFORMIN (GLUCOPHAGE-XR) 500 MG 24 hr tablet Take 1 tablet (500 mg total) by mouth in the morning and at bedtime. 180 tablet 3   methimazole (TAPAZOLE) 10 MG tablet Take 1.5 tablets (15 mg total) by mouth daily. 135 tablet 1   metoprolol succinate (TOPROL-XL) 100 MG 24 hr tablet Take 100 mg by mouth daily.     naloxone (NARCAN) nasal spray 4 mg/0.1 mL Place 1 spray into the nose as needed.     pregabalin (LYRICA) 200 MG capsule Take 200 mg by mouth 3 (three) times daily.     Semaglutide, 1 MG/DOSE, (OZEMPIC, 1 MG/DOSE,) 4 MG/3ML SOPN Inject 1 mg into the skin.     Sertraline HCl 150 MG CAPS Take 1 capsule by mouth daily.     spironolactone (ALDACTONE) 25 MG tablet TAKE 1 TABLET (25 MG TOTAL) BY MOUTH DAILY. 30 tablet 3   TRESIBA FLEXTOUCH 100 UNIT/ML  FlexTouch Pen SMARTSIG:50 Unit(s) SUB-Q Daily     aspirin EC 81 MG tablet Take 81 mg by mouth daily. Swallow whole.     furosemide (LASIX) 20 MG tablet Take 20 mg by mouth daily.     morphine (MS CONTIN) 30 MG 12 hr tablet Take 30 mg by mouth every 12 (twelve) hours.     oxyCODONE-acetaminophen (PERCOCET) 10-325 MG tablet SMARTSIG:1 Tablet(s) By Mouth 6 Times Daily     triamcinolone cream (KENALOG) 0.5 % Apply 1 application topically 2 (two) times daily.     No facility-administered medications prior to visit.   Final Medications at End of Visit    Current Meds  Medication Sig   ALPRAZolam (XANAX) 0.5 MG tablet Take by mouth at bedtime.   atorvastatin (LIPITOR) 40 MG tablet Take 40 mg by mouth daily.   baclofen (LIORESAL) 10 MG tablet Take 10 mg by mouth 3 (three) times daily as needed.   buprenorphine (SUBUTEX) 8 MG SUBL SL tablet Place 8 mg under the tongue in the morning, at noon, and at bedtime.   candesartan (ATACAND) 4 MG tablet Take 4 mg by mouth daily.   ELIQUIS 5 MG TABS tablet Take 5 mg by mouth 2 (two) times daily.   LINZESS 290 MCG CAPS capsule Take 290 mcg by mouth daily as needed.   metFORMIN (GLUCOPHAGE-XR)  500 MG 24 hr tablet Take 1 tablet (500 mg total) by mouth in the morning and at bedtime.   methimazole (TAPAZOLE) 10 MG tablet Take 1.5 tablets (15 mg total) by mouth daily.   metoprolol succinate (TOPROL-XL) 100 MG 24 hr tablet Take 100 mg by mouth daily.   naloxone (NARCAN) nasal spray 4 mg/0.1 mL Place 1 spray into the nose as needed.   pregabalin (LYRICA) 200 MG capsule Take 200 mg by mouth 3 (three) times daily.   Semaglutide, 1 MG/DOSE, (OZEMPIC, 1 MG/DOSE,) 4 MG/3ML SOPN Inject 1 mg into the skin.   Sertraline HCl 150 MG CAPS Take 1 capsule by mouth daily.   spironolactone (ALDACTONE) 25 MG tablet TAKE 1 TABLET (25 MG TOTAL) BY MOUTH DAILY.   TRESIBA FLEXTOUCH 100 UNIT/ML FlexTouch Pen SMARTSIG:50 Unit(s) SUB-Q Daily   [DISCONTINUED] aspirin EC 81 MG tablet Take  81 mg by mouth daily. Swallow whole.   Radiology:   No results found. Coronary CTA 12/08/2020: 1. Total coronary calcium score of 457. This was 99th percentile for age and sex matched control. 2. Normal coronary origin with left dominance. 3. CAD-RADS = 3. Minimal stenosis (0-24%) at the ostial left main due to calcified plaque. Mild stenosis (25-49%) at distal left main/ostial LAD due to calcified plaque. Moderate stenosis (50-69%) at mid LAD due to calcified plaque. Mid to distal LAD is patent. LCX is patent with diffuse luminal irregularities no evidence of significant stenosis. Mild stenosis (25-49%) at proximal RCA due to calcified plaque. Mid to distal RCA is patent. 4.  Aortic Atherosclerosis.  Cardiac Studies:  PCV ECHOCARDIOGRAM COMPLETE 12/08/2020 Hyperdynamic LV systolic function with visual EF >70%. Left ventricle cavity is normal in size. Moderate left ventricular hypertrophy. Normal global wall motion. Normal diastolic filling pattern, normal LAP. Mild (Grade I) mitral regurgitation. Mild tricuspid regurgitation. No prior study for comparison.   Carotid artery duplex 06/03/2020:  Stenosis in the right internal carotid artery (minimal). Stenosis in the right external carotid artery (<50%).  Stenosis in the left internal carotid artery (50-69%). Stenosis in the left external carotid artery (<50%).  Antegrade right vertebral artery flow. Antegrade left vertebral artery flow.  No significant change from 03/31/2020. Follow up in six months is appropriate if clinically indicated.  ABI 09/05/2019:  This exam reveals mildly decreased perfusion of the right lower extremity, noted at the post tibial artery level with monophasic waveform(ABI 0.96).    The right anterior tibial artery shows dampened monophasic waveform suggestive of severe diffuse disease or occlusion.  This exam reveals mildly decreased perfusion of the left lower extremity, noted at the anterior tibial and post  tibial artery level (ABI 0.96).   EKG:  01/11/2021: Normal sinus rhythm at a rate of 70 bpm.  Normal axis.  No evidence of ischemia or underlying injury pattern.  Compared EKG 11/19/2020, no significant change  EKG 03/22/2020: Sinus rhythm at a rate of 82 bpm, left atrial enlargement.  Normal axis.  Cannot exclude anteroseptal infarct old.  Low voltage complexes.  EKG 09/02/2019: Sinus rhythm 81 bpm. Cannot exclude old anteroseptal infarct.  Assessment     ICD-10-CM   1. Essential hypertension  I10 EKG 12-Lead    2. Atrial fibrillation, unspecified type (HCC)  I48.91 EKG 12-Lead    3. Coronary artery disease involving native coronary artery of native heart without angina pectoris  I25.10     4. PVD (peripheral vascular disease) (HCC)  I73.9        Medications Discontinued During This  Encounter  Medication Reason   morphine (MS CONTIN) 30 MG 12 hr tablet    furosemide (LASIX) 20 MG tablet    oxyCODONE-acetaminophen (PERCOCET) 10-325 MG tablet    triamcinolone cream (KENALOG) 0.5 %    aspirin EC 81 MG tablet Discontinued by provider    No orders of the defined types were placed in this encounter.   Recommendations:   Carmen Armstrong is a 55 y.o.  with hypertension, hyperlipidemia, type 2 DM, PAD, depression. History of ablation for atrial fibrillation in February and April 2021. She had ulcer of her left fifth toe, with worsening gangrene despite revascularization of right and left lower extremities, required amputation.  CAD:  Reviewed and discussed with patient results of echocardiogram and coronary CTA, details above. Recommend medical management, could consider invasive angiography if patient continues to have dyspnea on exertion or anginal symptoms. Continue metoprolol, atorvastatin, candesartan.  Patient will discontinue aspirin as she is presently on Eliquis given history of atrial fibrillation.  PAD: ABI revealed mildly decreased perfusion of the left lower  extremity, noted at the anterior tibial and post tibial artery level (ABI 0.96)  Continue statin therapy Patient presently on Eliquis given history of paroxysmal atrial fibrillation, not on aspirin at this time. Patient unable to tolerate Plavix due to rash  Patient will notify our office if symptoms of claudication develop with discontinuation of aspirin.  However will stop Aspirin to reduce bleeding risk with Eliquis as patient denies PCI or angioplasty in the last 1 year.   Paroxysmal A. Fib: History of prior ablations.   Maintaining sinus rhythm. CHA2DS2VASc score 4, annual stroke risk 5%. Tolerating anticoagulation without bleeding diathesis  Continue Eliquis 5 mg twice daily   Hypertension: Well-controlled  Continue candesartan, metoprolol succinate, spironolactone, and furosemide.  Bilateral carotid artery stenosis:  We will schedule for carotid artery surveillance. Continue statin therapy.  Follow up in    [place], PA-C 01/11/2021, 1:14 PM Office: [phone]

## 2021-01-12 ENCOUNTER — Ambulatory Visit
Admission: RE | Admit: 2021-01-12 | Discharge: 2021-01-12 | Disposition: A | Discharge status: Home / Self Care | Payer: 59 | Source: Ambulatory Visit | Attending: Internal Medicine | Admitting: Internal Medicine

## 2021-01-12 DIAGNOSIS — E0590 Thyrotoxicosis, unspecified without thyrotoxic crisis or storm: Secondary | ICD-10-CM

## 2021-01-12 IMAGING — US US THYROID
1 series · 14 of 25 positions shown · non-contrast
Comparison: None.

CLINICAL DATA: Hyperthyroid.

EXAM:
THYROID ULTRASOUND
TECHNIQUE: Ultrasound examination of the thyroid gland and adjacent soft
tissues was performed.

[Series 1: us thyroid · 0.08mm/px · 14 of 39 slices shown]
[im 1/39]
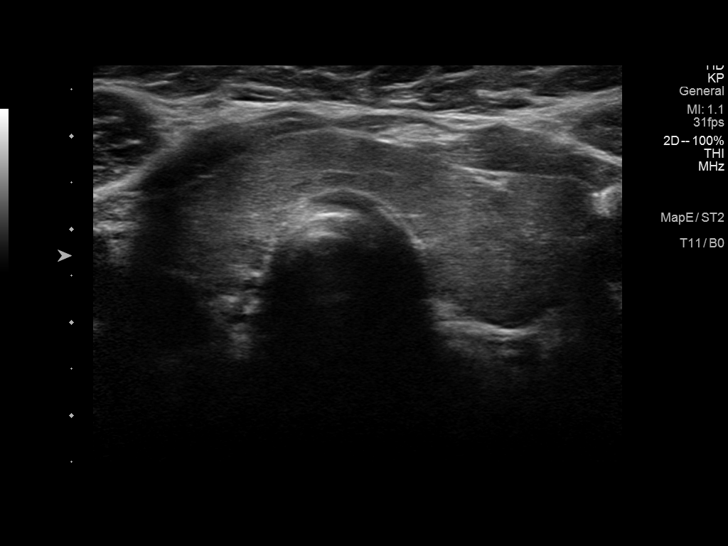
[im 4/39]
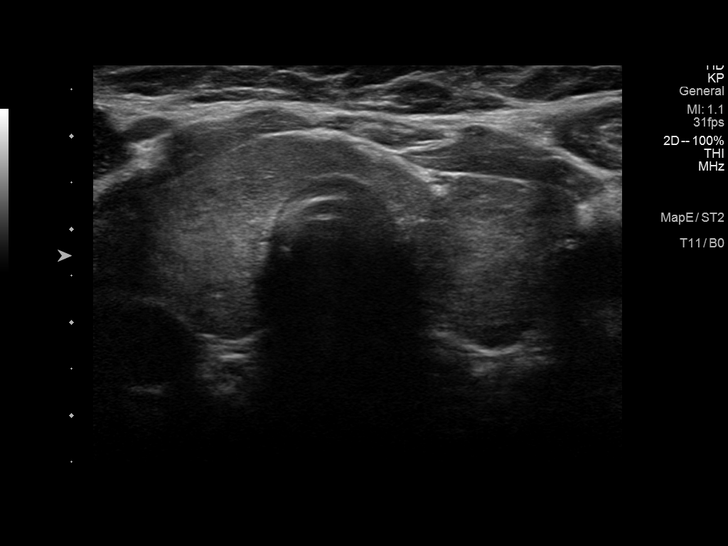
[im 7/39]
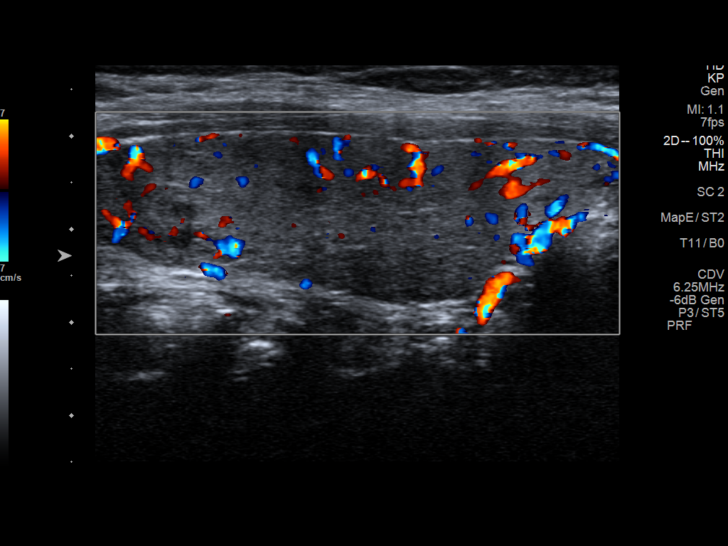
[im 10/39]
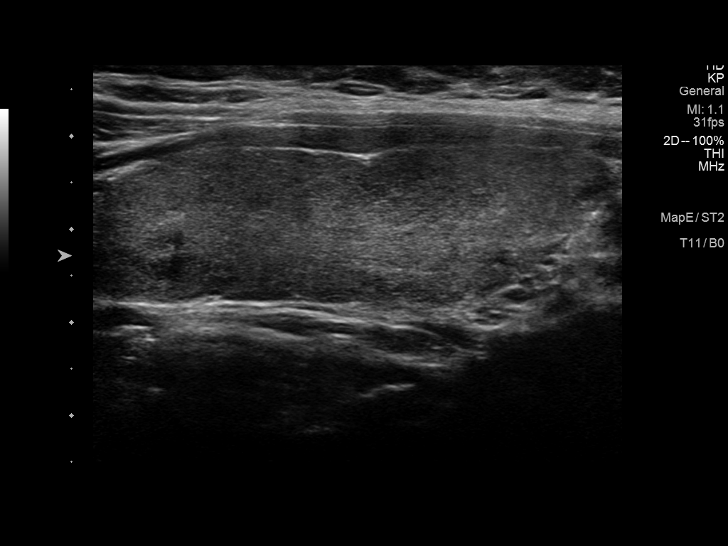
[im 13/39]
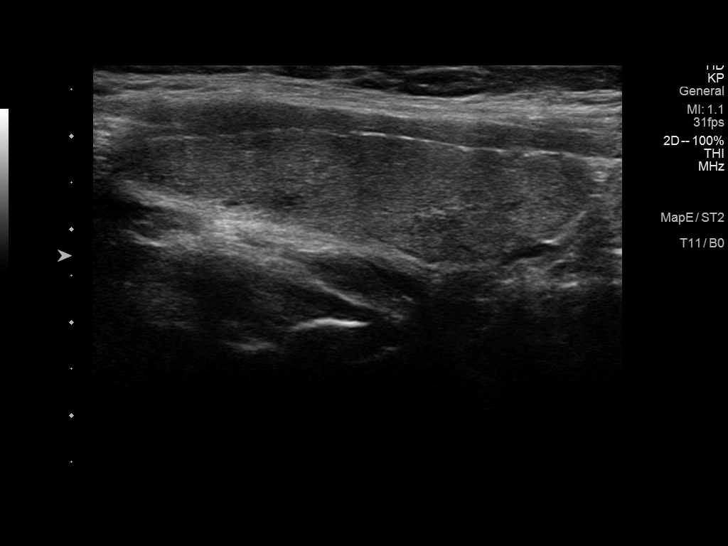
[im 15/39]
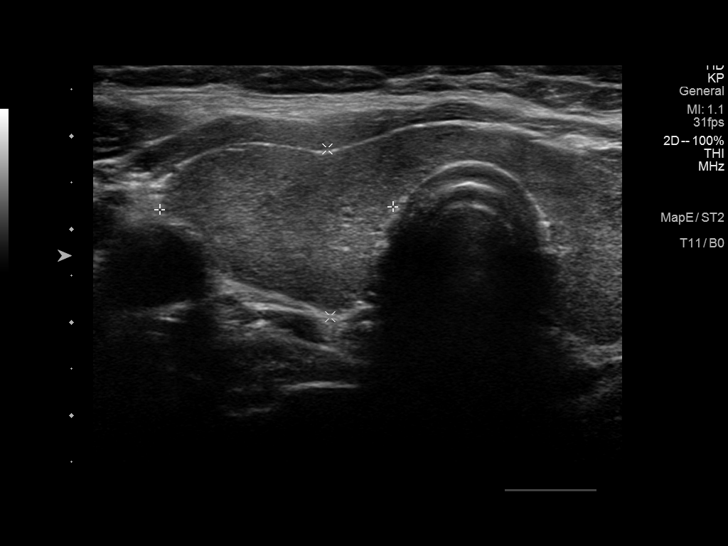
[im 18/39]
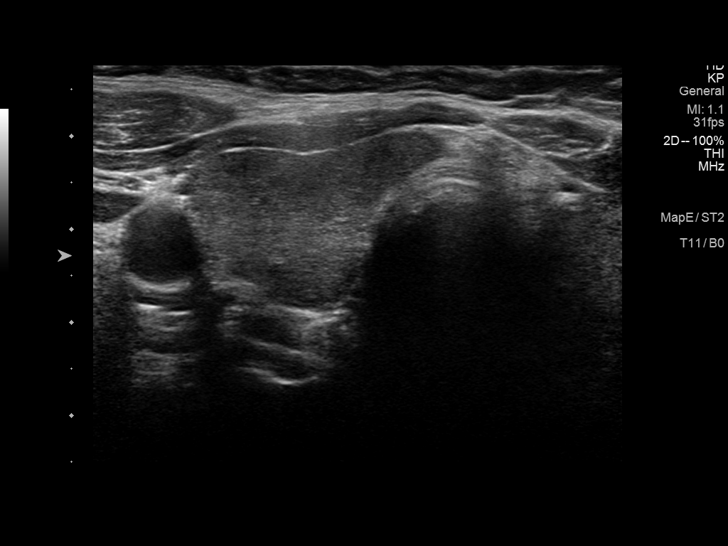
[im 21/39]
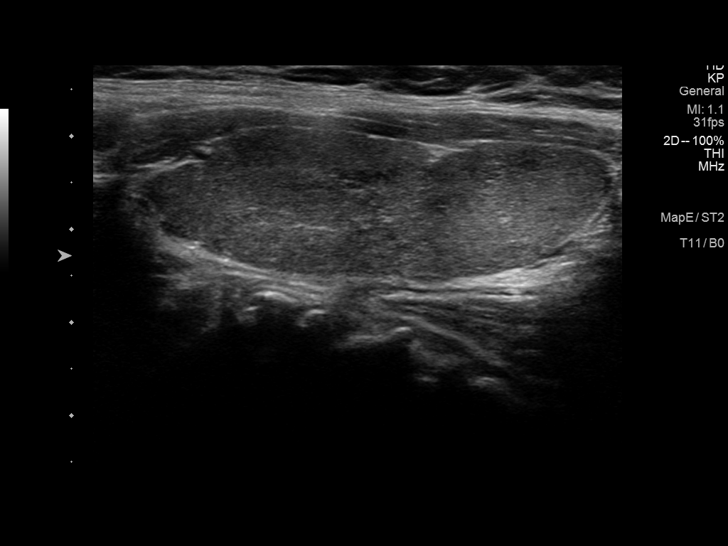
[im 24/39]
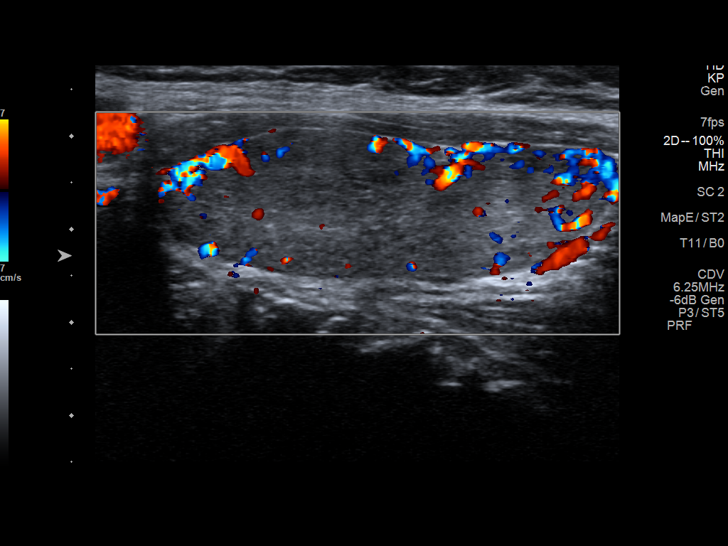
[im 26/39]
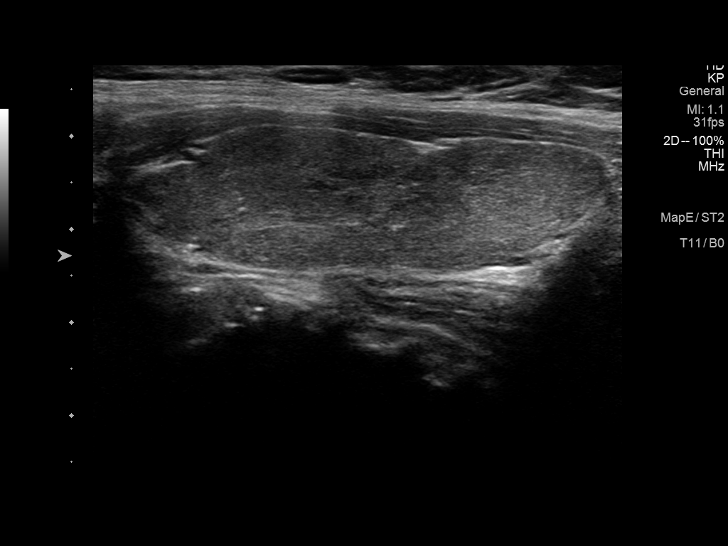
[im 29/39]
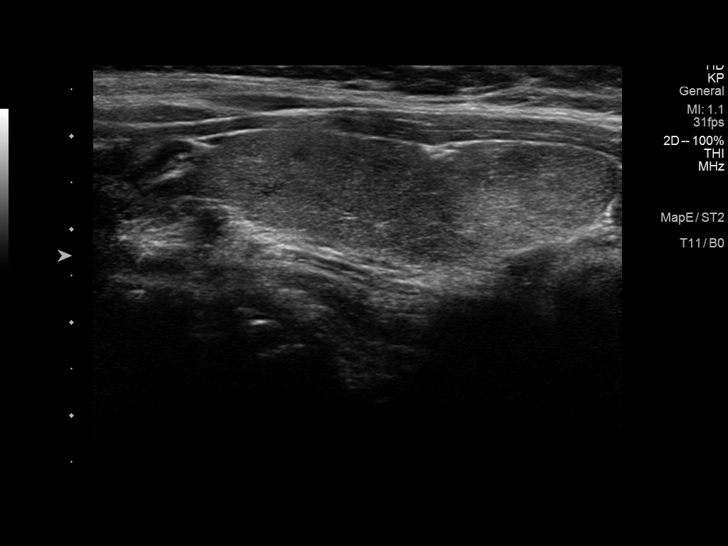
[im 32/39]
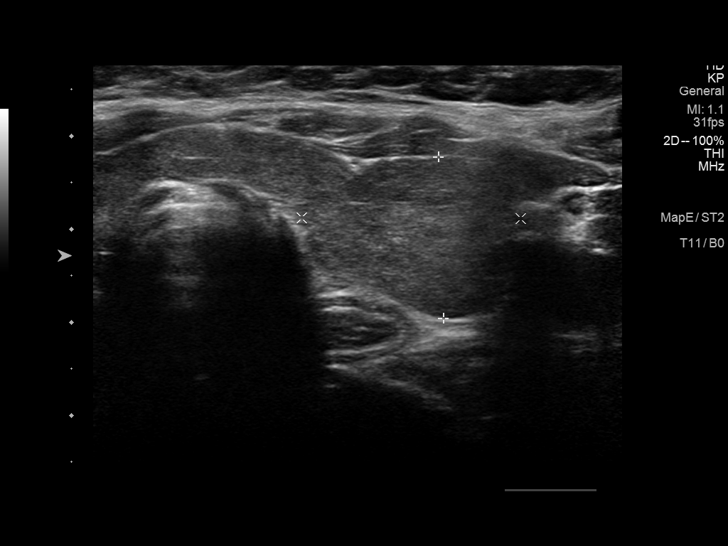
[im 35/39]
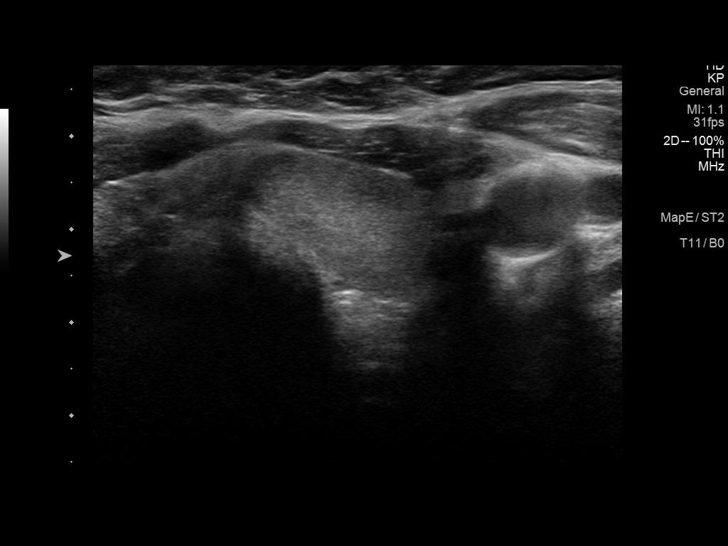
[im 39/39]
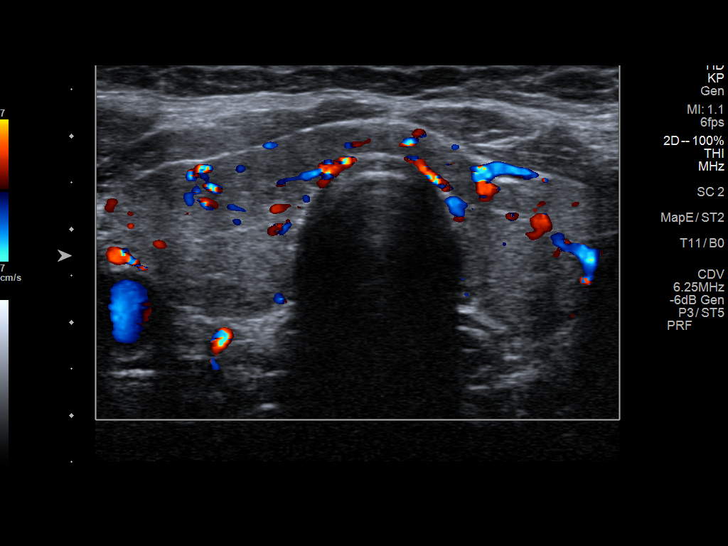

[14 of 25 positions shown; findings below may reference images not displayed]

FINDINGS: Parenchymal Echotexture: Mildly heterogenous

Isthmus: 0.6 cm

Right lobe: 5.8 x 1.8 x 2.5 cm

Left lobe: 5.1 x 1.7 x 2.4 cm

_________________________________________________________

Estimated total number of nodules >/= 1 cm: 0

Number of spongiform nodules >/=  2 cm not described below (TR1): 0

Number of mixed cystic and solid nodules >/= 1.5 cm not described
below (TR2): 0

_________________________________________________________

No discrete nodules are seen within the thyroid gland.
IMPRESSION: 1. Borderline enlarged thyroid gland without discrete nodule
identified.

## 2021-01-13 ENCOUNTER — Other Ambulatory Visit: Payer: Self-pay | Admitting: Optometry

## 2021-01-13 DIAGNOSIS — H532 Diplopia: Secondary | ICD-10-CM

## 2021-01-17 ENCOUNTER — Ambulatory Visit: Payer: 59

## 2021-01-17 ENCOUNTER — Other Ambulatory Visit: Payer: Self-pay

## 2021-01-17 DIAGNOSIS — I6523 Occlusion and stenosis of bilateral carotid arteries: Secondary | ICD-10-CM

## 2021-01-18 NOTE — Progress Notes (Signed)
You are seeing her in 6 months, discussed with patient.

## 2021-02-15 ENCOUNTER — Other Ambulatory Visit: Payer: 59

## 2021-02-16 ENCOUNTER — Ambulatory Visit: Payer: 59 | Admitting: Podiatry

## 2021-02-18 ENCOUNTER — Ambulatory Visit: Payer: 59 | Admitting: Student

## 2021-02-21 ENCOUNTER — Ambulatory Visit: Payer: 59 | Admitting: Podiatry

## 2021-02-21 ENCOUNTER — Ambulatory Visit (INDEPENDENT_AMBULATORY_CARE_PROVIDER_SITE_OTHER): Payer: 59

## 2021-02-21 ENCOUNTER — Other Ambulatory Visit: Payer: Self-pay | Admitting: Podiatry

## 2021-02-21 ENCOUNTER — Other Ambulatory Visit: Payer: Self-pay

## 2021-02-21 ENCOUNTER — Encounter: Payer: Self-pay | Admitting: Podiatry

## 2021-02-21 DIAGNOSIS — S90425A Blister (nonthermal), left lesser toe(s), initial encounter: Secondary | ICD-10-CM

## 2021-02-21 DIAGNOSIS — L97522 Non-pressure chronic ulcer of other part of left foot with fat layer exposed: Secondary | ICD-10-CM | POA: Diagnosis not present

## 2021-02-21 DIAGNOSIS — E08621 Diabetes mellitus due to underlying condition with foot ulcer: Secondary | ICD-10-CM

## 2021-02-21 MED ORDER — DOXYCYCLINE HYCLATE 100 MG PO TABS: 100.0000 mg | ORAL_TABLET | Freq: Two times a day (BID) | ORAL | 0 refills | Status: AC

## 2021-02-21 NOTE — Progress Notes (Signed)
°  Subjective:  Patient ID: Carmen Armstrong, female    DOB: 1965/04/01,   MRN: [id number]  Chief Complaint  Patient presents with   Blister     Lt hallux blister at hallux tipx 3 days - no injury/rubbing of shoe - 9/10 sharp pains - w/ swelling of toe radiates up the leg - no N/V/f?Ch tx: none    56 y.o. female presents for concern of  left great toe blister that developed about three days ago. Denies any injury. States the blister popped and had bloody drainage. Denies redness or discoloration. Relates pain and swelling. Denies any treatments.  Patient is diabetic and last A1c was 8.2 01/03/21. Denies any other pedal complaints. Denies n/v/f/c.   PCP: Maryelizabeth Rowan MD.   Past Medical History:  Diagnosis Date   Arrhythmia    Atrial fibrillation [facility])    CAD (coronary artery disease)    Chronic lower back pain    Diabetes mellitus without complication (HCC)    Eczema    GAD (generalized anxiety disorder)    H/O vitamin D deficiency    Heart failure (HCC)    Hyperlipidemia    Hypertension    Insomnia    PVD (peripheral vascular disease) (HCC)     Objective:  Physical Exam: Vascular: DP/PT pulses 2/4 bilateral. CFT <3 seconds. Normal hair growth on digits. No edema.  Skin. No lacerations or abrasions bilateral feet.  0.5x 0.4x0.2 cm ulcer to distal left hallux with granular base. Mild erythema and edema noted around toe. Swelling noted to left lower extremity and relates pain.  Musculoskeletal: MMT 5/5 bilateral lower extremities in DF, PF, Inversion and Eversion. Deceased ROM in DF of ankle joint.  Neurological: Sensation intact to light touch.   Assessment:   1. Diabetic ulcer of toe of left foot associated with diabetes mellitus due to underlying condition, with fat layer exposed (HCC)      Plan:  Patient was evaluated and treated and all questions answered. Ulcer left hallux with fat layer exposed.  X-rays reviewed and discussed with patient. No acute fractures or  dislocations noted. Possible osseous erosion noted to distal hallux concerning for osteomyelitis.  -Debridement as below. -Ordered MRI  -Ordered ABIs -Dressed with neosporin , DSD. -Off-loading with surgical shoe. -Doxycycline sent to pharmacy x 10 days.  -Discussed glucose control and proper protein-rich diet.  -Discussed if any worsening redness, pain, fever or chills to call or may need to report to the emergency room. Patient expressed understanding.   Procedure: Excisional Debridement of Wound Rationale: Removal of non-viable soft tissue from the wound to promote healing.  Anesthesia: none Pre-Debridement Wound Measurements: Overlaying blister/hyerkeratosis  Post-Debridement Wound Measurements: 0.5 cm x 0.4 cm x 0.2 cm  Type of Debridement: Sharp Excisional Tissue Removed: Non-viable soft tissue Depth of Debridement: subcutaneous tissue. Technique: Sharp excisional debridement to bleeding, viable wound base.  Dressing: Dry, sterile, compression dressing. Disposition: Patient tolerated procedure well. Patient to return in 2 week for follow-up.  Return in about 2 weeks (around 03/07/2021) for wound check.   Louann Sjogren, DPM

## 2021-02-22 ENCOUNTER — Ambulatory Visit
Admission: RE | Admit: 2021-02-22 | Discharge: 2021-02-22 | Disposition: A | Discharge status: Home / Self Care | Payer: 59 | Source: Ambulatory Visit | Attending: Podiatry | Admitting: Podiatry

## 2021-02-22 DIAGNOSIS — E08621 Diabetes mellitus due to underlying condition with foot ulcer: Secondary | ICD-10-CM

## 2021-02-22 IMAGING — MR MR FOOT*L* W/O CM
5 series · 40 of 40 positions shown · non-contrast
Comparison: Left foot radiographs [DATE]

CLINICAL DATA: Foot swelling.  Diabetic.  Osteomyelitis suspected.

EXAM:
MRI OF THE LEFT FOOT WITHOUT CONTRAST
TECHNIQUE: Multiplanar, multisequence MR imaging of the left forefoot was
performed. No intravenous contrast was administered.

[Series 4: T1 · coronal · left · 3.0mm · 0.38mm/px · 11 of 40 slices shown (1 of 2)]
[im 1/40]
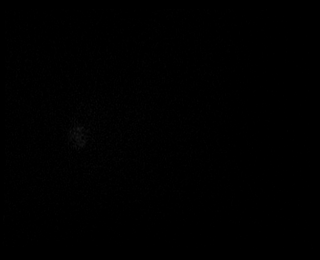
[im 4/40]
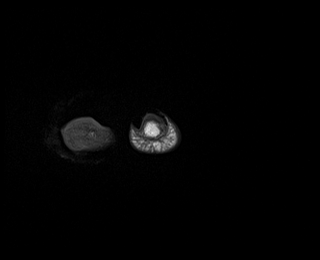
[im 8/40]
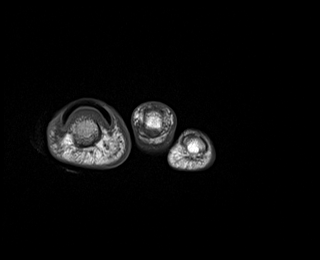
[im 12/40]
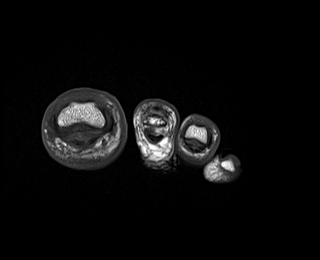
[im 16/40]
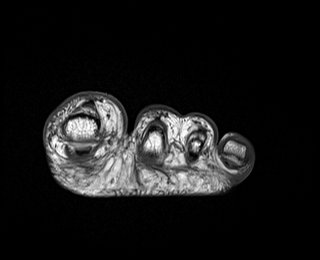
[im 20/40]
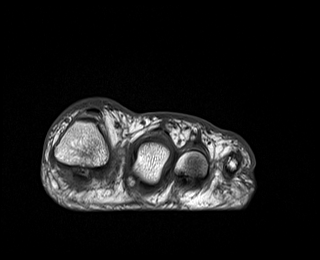
[im 24/40]
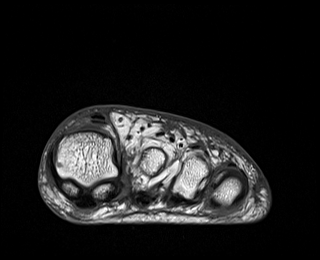
[im 28/40]
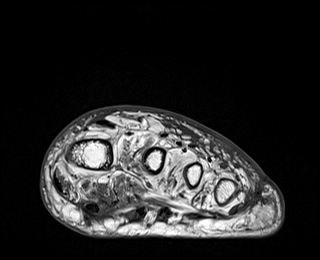
[im 32/40]
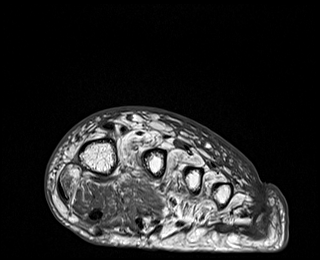
[im 36/40]
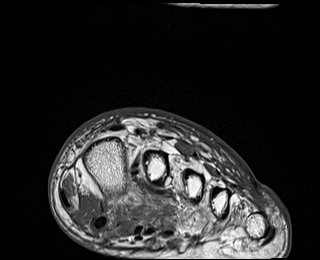
[im 40/40]
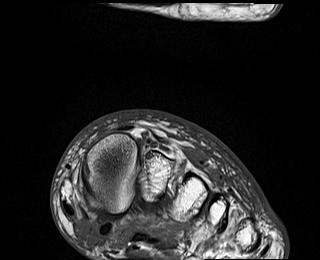

[Series 5: T2 fat-sat · coronal · left · 3.0mm · 0.38mm/px · 10 of 40 slices shown (1 of 2)]
[im 1/40]
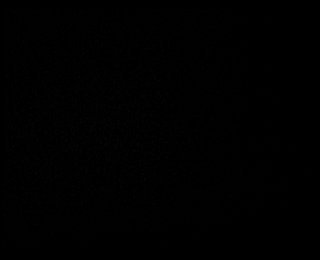
[im 5/40]
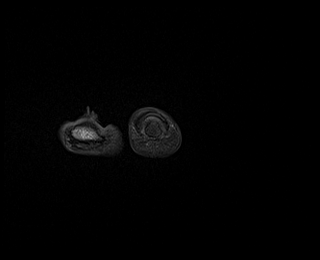
[im 9/40]
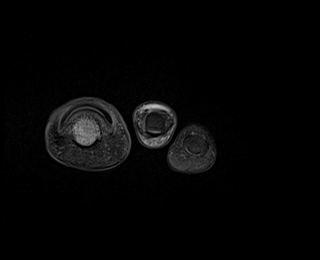
[im 14/40]
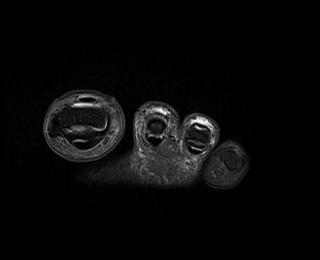
[im 18/40]
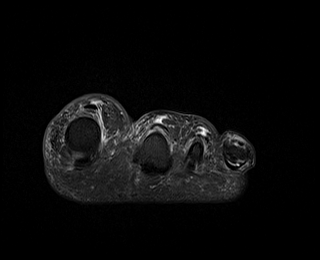
[im 22/40]
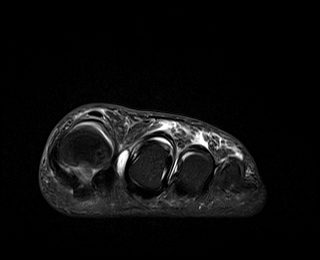
[im 27/40]
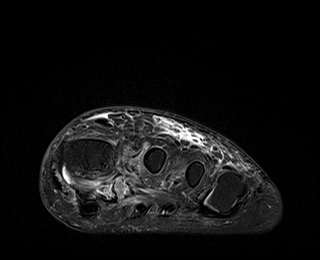
[im 31/40]
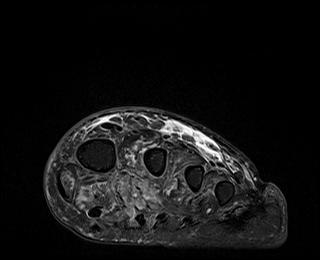
[im 35/40]
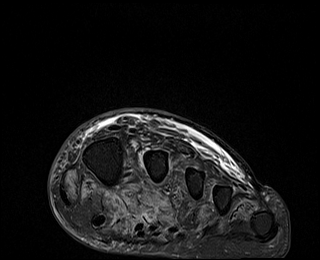
[im 40/40]
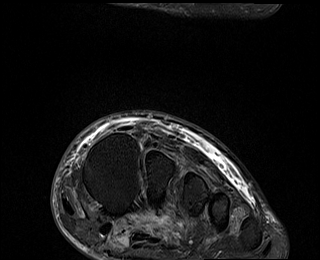

[Series 6: STIR · sagittal · left · 3.0mm · 0.70mm/px · 7 of 29 slices shown]
[im 1/29]
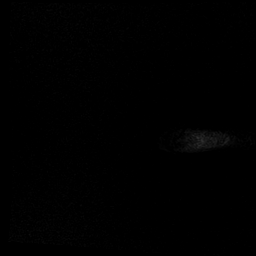
[im 5/29]
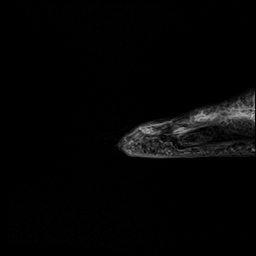
[im 10/29]
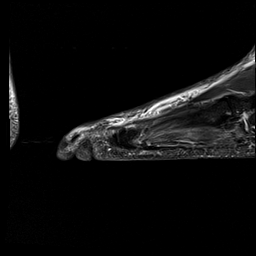
[im 15/29]
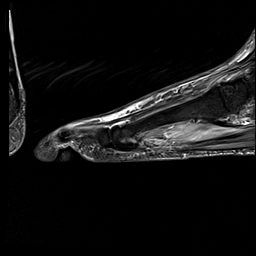
[im 19/29]
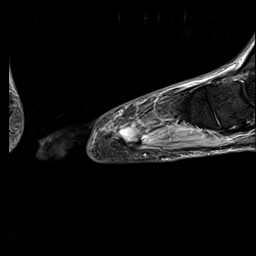
[im 24/29]
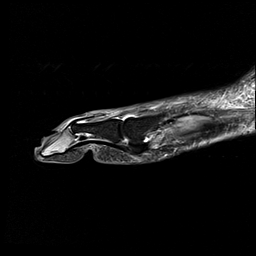
[im 29/29]
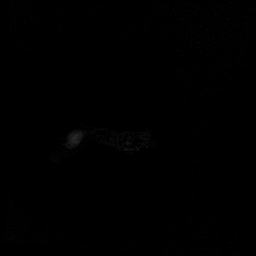

[Series 7: T1 · axial · left · 3.0mm · 0.56mm/px · z∈[-74,+13]mm · 6 of 23 slices shown (2 of 2)]
[im 1/23]
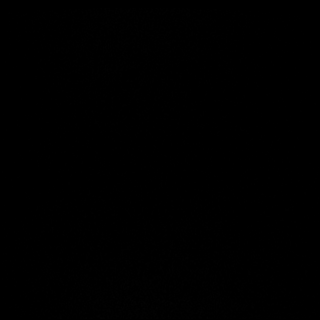
[im 5/23]
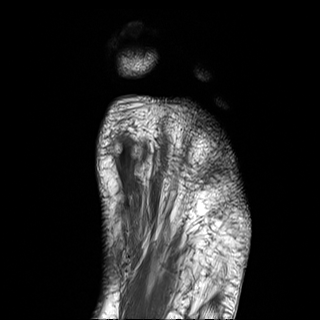
[im 9/23]
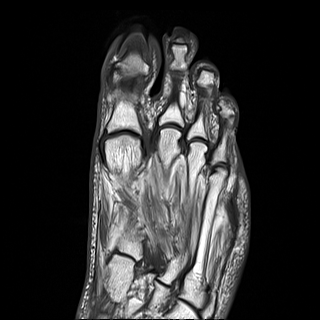
[im 14/23]
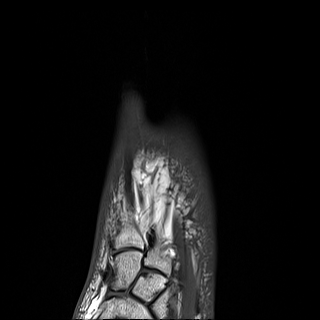
[im 18/23]
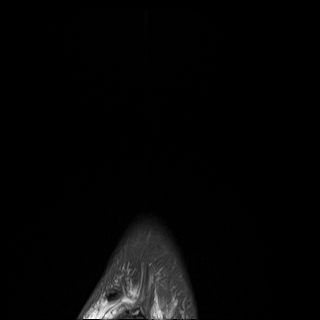
[im 23/23]
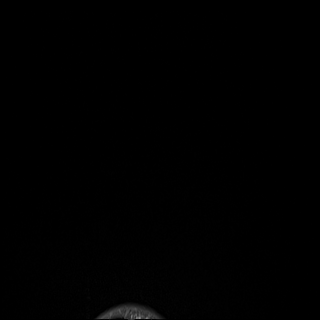

[Series 8: T2 fat-sat · axial · left · 3.0mm · 0.56mm/px · z∈[-74,+13]mm · 6 of 23 slices shown (2 of 2)]
[im 1/23]
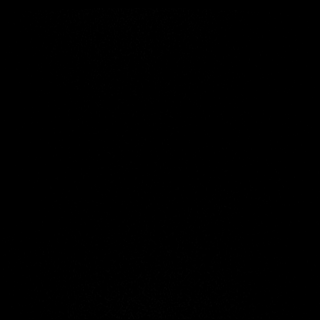
[im 5/23]
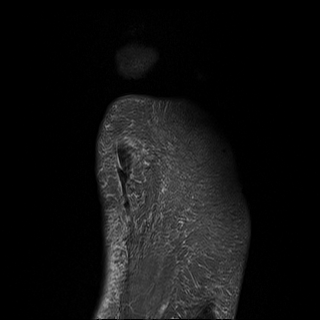
[im 9/23]
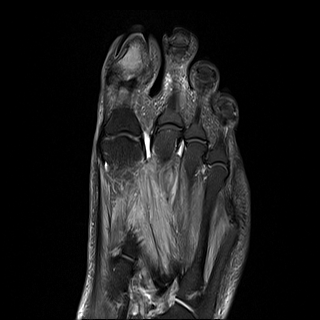
[im 14/23]
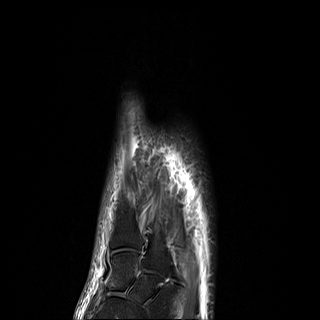
[im 18/23]
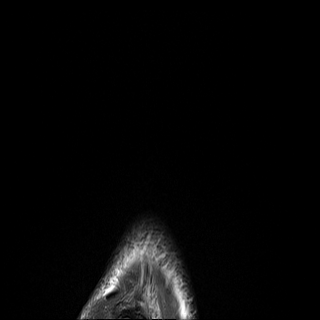
[im 23/23]
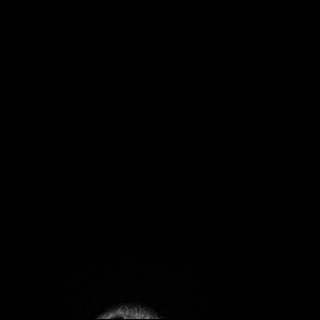

[40 of 40 positions shown; findings below may reference images not displayed]

FINDINGS: Bones/Joint/Cartilage

Postsurgical changes are seen of amputation of the fifth digit to
the level of the distal shaft of the fifth metatarsal.

There is diffuse high-grade edema throughout the distal phalanx of
the great toe. There appears to be soft tissue ulceration within the
distal most soft tissues of the great toe distal to the distal
medial aspect of the distal tuft (coronal image 8, sagittal image
5).

Mild great toe metatarsophalangeal joint peripheral osteophytosis.

No acute fracture.

Ligaments

The Lisfranc ligament complex is intact.

Muscles and Tendons

There is moderate edema throughout the midfoot musculature plantar
to the first and second metatarsals.

There is high-grade fatty infiltration of the intermetatarsal
midfoot musculature. The visualized flexor and extensor tendons are
intact. Minimal flexor hallucis longus tenosynovitis.

Soft tissues

Moderate dorsal midfoot subcutaneous fat edema and swelling.
IMPRESSION: :
IMPRESSION: 1. Soft tissue ulceration of the distal aspect of the great toe with
distal cortical erosion and diffuse marrow edema throughout the
distal phalanx of the great toe highly suspicious for acute
osteomyelitis.
2. Prior amputation to the distal shaft of fifth metatarsal.
3. Moderate edema throughout the midfoot musculature plantar to the
first and second metatarsals.

## 2021-02-23 ENCOUNTER — Ambulatory Visit: Payer: 59 | Admitting: Podiatry

## 2021-02-23 ENCOUNTER — Ambulatory Visit: Payer: 59

## 2021-02-23 ENCOUNTER — Other Ambulatory Visit: Payer: Self-pay

## 2021-02-23 ENCOUNTER — Encounter: Payer: Self-pay | Admitting: Podiatry

## 2021-02-23 DIAGNOSIS — D689 Coagulation defect, unspecified: Secondary | ICD-10-CM

## 2021-02-23 DIAGNOSIS — L97522 Non-pressure chronic ulcer of other part of left foot with fat layer exposed: Secondary | ICD-10-CM

## 2021-02-23 DIAGNOSIS — E08621 Diabetes mellitus due to underlying condition with foot ulcer: Secondary | ICD-10-CM

## 2021-02-23 DIAGNOSIS — E1142 Type 2 diabetes mellitus with diabetic polyneuropathy: Secondary | ICD-10-CM

## 2021-02-23 NOTE — Progress Notes (Signed)
°  Subjective:  Patient ID: Carmen Armstrong, female    DOB: 03-Mar-1965,   MRN: [id number]  Chief Complaint  Patient presents with   Blister    Pt is here for MRI results.     56 y.o. female presents for follow-up of left foot MRI and left foot wound. Here to discuss results and options. Has been dressing toe regularly.   Patient is diabetic and last A1c was 8.2 01/03/21. Denies any other pedal complaints. Denies n/v/f/c.   PCP: Maryelizabeth Rowan MD.   Past Medical History:  Diagnosis Date   Arrhythmia    Atrial fibrillation [facility])    CAD (coronary artery disease)    Chronic lower back pain    Diabetes mellitus without complication (HCC)    Eczema    GAD (generalized anxiety disorder)    H/O vitamin D deficiency    Heart failure (HCC)    Hyperlipidemia    Hypertension    Insomnia    PVD (peripheral vascular disease) (HCC)     Objective:  Physical Exam: Vascular: DP/PT pulses 2/4 bilateral. CFT <3 seconds. Normal hair growth on digits. No edema.  Skin. No lacerations or abrasions bilateral feet.  0.5x 0.4x0.2 cm ulcer to distal left hallux with granular base. Mild erythema and edema noted around toe. Swelling noted to left lower extremity and relates pain.  Musculoskeletal: MMT 5/5 bilateral lower extremities in DF, PF, Inversion and Eversion. Deceased ROM in DF of ankle joint.  Neurological: Sensation intact to light touch.   Assessment:   1. Diabetic ulcer of toe of left foot associated with diabetes mellitus due to underlying condition, with fat layer exposed (HCC)   2. Coagulation defect (HCC)   3. Diabetic polyneuropathy associated with type 2 diabetes mellitus (HCC)      Plan:  Patient was evaluated and treated and all questions answered. Ulcer left hallux with fat layer exposed.  X-rays reviewed and discussed with patient. No acute fractures or dislocations noted. Possible osseous erosion noted to distal hallux concerning for osteomyelitis.  -Debridement as  below. 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 Discussed options with patient and patient in agreement with partial amputaiton of left hallux. Will plan for next Tuesday pending vascular studies to be done on Friday.   -Ordered ABIs -Dressed with iodosorb , DSD. -Off-loading with surgical shoe. -Continue doxycycline.  -Discussed glucose control and proper protein-rich diet.  -Discussed if any worsening redness, pain, fever or chills to call or may need to report to the emergency room. Patient expressed understanding.  -Informed surgical risk consent was reviewed and read aloud to the patient.  I reviewed the films.  I have discussed my findings with the patient in great detail.  I have discussed all risks including but not limited to infection, stiffness, scarring, limp, disability, deformity, damage to blood vessels and nerves, numbness, poor healing, need for braces, arthritis, chronic pain, amputation, death.  All benefits and realistic expectations discussed in great detail.  I have made no promises as to the outcome.  I have provided realistic expectations.  I have offered the patient a 2nd opinion, which they have declined and assured me they preferred to proceed despite the risks. Surgery planned for 1/17 pending vascular studies.     No follow-ups on file.   Louann Sjogren, DPM

## 2021-02-23 NOTE — Addendum Note (Signed)
Addended by: Allean Found on: 02/23/2021 03:32 PM   Modules accepted: Orders

## 2021-02-24 ENCOUNTER — Telehealth: Payer: Self-pay | Admitting: Urology

## 2021-02-24 NOTE — Telephone Encounter (Signed)
DOS - 03/01/21  AMPUTATION TOE INTERPHALANGEAL 1ST LEFT --- 54270  [employer] EFFECTIVE DATE - 02/16/2008   PER [employer]'S AUTOMATIVE SYSTEM FOR CPT CODE 62376 NO PRIOR AUTH IS REQUIRED.  REF # AVA [id number]

## 2021-02-25 ENCOUNTER — Other Ambulatory Visit: Payer: Self-pay

## 2021-02-25 ENCOUNTER — Ambulatory Visit (HOSPITAL_COMMUNITY)
Admission: RE | Admit: 2021-02-25 | Discharge: 2021-02-25 | Disposition: A | Discharge status: Home / Self Care | Payer: 59 | Source: Ambulatory Visit | Attending: Podiatry | Admitting: Podiatry

## 2021-02-25 DIAGNOSIS — E08621 Diabetes mellitus due to underlying condition with foot ulcer: Secondary | ICD-10-CM | POA: Diagnosis present

## 2021-02-25 DIAGNOSIS — L97522 Non-pressure chronic ulcer of other part of left foot with fat layer exposed: Secondary | ICD-10-CM | POA: Insufficient documentation

## 2021-03-01 ENCOUNTER — Other Ambulatory Visit: Payer: Self-pay | Admitting: Podiatry

## 2021-03-01 ENCOUNTER — Encounter: Payer: Self-pay | Admitting: Podiatry

## 2021-03-01 DIAGNOSIS — E08621 Diabetes mellitus due to underlying condition with foot ulcer: Secondary | ICD-10-CM

## 2021-03-01 DIAGNOSIS — L97522 Non-pressure chronic ulcer of other part of left foot with fat layer exposed: Secondary | ICD-10-CM

## 2021-03-07 ENCOUNTER — Ambulatory Visit (INDEPENDENT_AMBULATORY_CARE_PROVIDER_SITE_OTHER): Payer: 59

## 2021-03-07 ENCOUNTER — Ambulatory Visit: Payer: 59 | Admitting: Podiatry

## 2021-03-07 ENCOUNTER — Encounter: Payer: Self-pay | Admitting: Podiatry

## 2021-03-07 ENCOUNTER — Other Ambulatory Visit: Payer: Self-pay

## 2021-03-07 ENCOUNTER — Ambulatory Visit (INDEPENDENT_AMBULATORY_CARE_PROVIDER_SITE_OTHER): Payer: 59 | Admitting: Podiatry

## 2021-03-07 DIAGNOSIS — E08621 Diabetes mellitus due to underlying condition with foot ulcer: Secondary | ICD-10-CM

## 2021-03-07 DIAGNOSIS — Z9889 Other specified postprocedural states: Secondary | ICD-10-CM

## 2021-03-07 DIAGNOSIS — L97522 Non-pressure chronic ulcer of other part of left foot with fat layer exposed: Secondary | ICD-10-CM

## 2021-03-07 NOTE — Progress Notes (Signed)
Subjective:  Patient ID: Carmen Armstrong, female    DOB: Feb 15, 1965,  MRN: [id number]  Chief Complaint  Patient presents with   Routine Post Op    POV #1 -pt  denies N/V/f/Ch -dressing intact but very loose - no issues/pain - Tx: sx shoe, morphine and elevation     DOS: 03/01/21 Procedure: Left parital hallux amputation   56 y.o. female returns for POV#1. Doing well not having any pain.   Review of Systems: Negative except as noted in the HPI. Denies N/V/F/Ch.  Past Medical History:  Diagnosis Date   Arrhythmia    Atrial fibrillation (HCC)    CAD (coronary artery disease)    Chronic lower back pain    Diabetes mellitus without complication (HCC)    Eczema    GAD (generalized anxiety disorder)    H/O vitamin D deficiency    Heart failure (HCC)    Hyperlipidemia    Hypertension    Insomnia    PVD (peripheral vascular disease) (HCC)     Current Outpatient Medications:    ALPRAZolam (XANAX) 0.5 MG tablet, Take by mouth at bedtime., Disp: , Rfl:    atorvastatin (LIPITOR) 40 MG tablet, Take 40 mg by mouth daily., Disp: , Rfl:    baclofen (LIORESAL) 10 MG tablet, Take 10 mg by mouth 3 (three) times daily as needed., Disp: , Rfl:    buprenorphine (SUBUTEX) 8 MG SUBL SL tablet, Place 8 mg under the tongue in the morning, at noon, and at bedtime., Disp: , Rfl:    candesartan (ATACAND) 4 MG tablet, Take 4 mg by mouth daily., Disp: , Rfl:    ELIQUIS 5 MG TABS tablet, Take 5 mg by mouth 2 (two) times daily., Disp: , Rfl:    LINZESS 290 MCG CAPS capsule, Take 290 mcg by mouth daily as needed., Disp: , Rfl:    metFORMIN (GLUCOPHAGE-XR) 500 MG 24 hr tablet, Take 1 tablet (500 mg total) by mouth in the morning and at bedtime., Disp: 180 tablet, Rfl: 3   methimazole (TAPAZOLE) 10 MG tablet, Take 1.5 tablets (15 mg total) by mouth daily., Disp: 135 tablet, Rfl: 1   metoprolol succinate (TOPROL-XL) 100 MG 24 hr tablet, Take 100 mg by mouth daily., Disp: , Rfl:    naloxone (NARCAN) nasal  spray 4 mg/0.1 mL, Place 1 spray into the nose as needed., Disp: , Rfl:    pregabalin (LYRICA) 200 MG capsule, Take 200 mg by mouth 3 (three) times daily., Disp: , Rfl:    Semaglutide, 1 MG/DOSE, (OZEMPIC, 1 MG/DOSE,) 4 MG/3ML SOPN, Inject 1 mg into the skin., Disp: , Rfl:    Sertraline HCl 150 MG CAPS, Take 1 capsule by mouth daily., Disp: , Rfl:    spironolactone (ALDACTONE) 25 MG tablet, TAKE 1 TABLET (25 MG TOTAL) BY MOUTH DAILY., Disp: 30 tablet, Rfl: 3   TRESIBA FLEXTOUCH 100 UNIT/ML FlexTouch Pen, SMARTSIG:50 Unit(s) SUB-Q Daily, Disp: , Rfl:   Social History   Tobacco Use  Smoking Status Every Day   Packs/day: 0.50   Years: 30.00   Pack years: 15.00   Types: Cigarettes  Smokeless Tobacco Never    Allergies  Allergen Reactions   Other     Hazelnuts- SOB   Penicillin G     whelps   Trulicity [Dulaglutide]     Large itchy bumps   Objective:  There were no vitals filed for this visit. There is no height or weight on file to calculate BMI. Constitutional Well developed. Well  nourished.  Vascular Foot warm and well perfused. Capillary refill normal to all digits.   Neurologic Normal speech. Oriented to person, place, and time. Epicritic sensation to light touch grossly present bilaterally.  Dermatologic Skin healing well without signs of infection. Skin edges well coapted without signs of infection.  Orthopedic: Tenderness to palpation noted about the surgical site.   Radiographs: Distal phalanx hallux removed no osseous erosions noted.  Assessment:   1. Status post surgery   2. Diabetic ulcer of toe of left foot associated with diabetes mellitus due to underlying condition, with fat layer exposed (HCC)    Plan:  Patient was evaluated and treated and all questions answered.  S/p foot surgery left -Progressing as expected post-operatively. -WB Status: WBAT in surgical shoe  -Sutures: intact. -Medications: n/a -Foot redressed. Follow-up in 2 weeks for suture  removal.   No follow-ups on file.

## 2021-03-14 ENCOUNTER — Encounter: Payer: Self-pay | Admitting: Podiatry

## 2021-03-21 ENCOUNTER — Ambulatory Visit (INDEPENDENT_AMBULATORY_CARE_PROVIDER_SITE_OTHER): Payer: 59 | Admitting: Podiatry

## 2021-03-21 ENCOUNTER — Other Ambulatory Visit: Payer: Self-pay

## 2021-03-21 ENCOUNTER — Encounter: Payer: Self-pay | Admitting: Podiatry

## 2021-03-21 DIAGNOSIS — Z9889 Other specified postprocedural states: Secondary | ICD-10-CM

## 2021-03-21 NOTE — Progress Notes (Signed)
Subjective:  Patient ID: Carmen Armstrong, female    DOB: 07/24/1965,  MRN: [id number]  Chief Complaint  Patient presents with   Routine Post Op      POV #2 DOS 03/01/21 LEFT PARTIAL HALLUX AMPUTATION    DOS: 03/01/21 Procedure: Left parital hallux amputation   56 y.o. female returns for POV#2. Doing well not having any pain. Does relate that she did get the dressing wet last week.   Review of Systems: Negative except as noted in the HPI. Denies N/V/F/Ch.  Past Medical History:  Diagnosis Date   Arrhythmia    Atrial fibrillation (HCC)    CAD (coronary artery disease)    Chronic lower back pain    Diabetes mellitus without complication (HCC)    Eczema    GAD (generalized anxiety disorder)    H/O vitamin D deficiency    Heart failure (HCC)    Hyperlipidemia    Hypertension    Insomnia    PVD (peripheral vascular disease) (HCC)     Current Outpatient Medications:    ALPRAZolam (XANAX) 0.5 MG tablet, Take by mouth at bedtime., Disp: , Rfl:    atorvastatin (LIPITOR) 40 MG tablet, Take 40 mg by mouth daily., Disp: , Rfl:    baclofen (LIORESAL) 10 MG tablet, Take 10 mg by mouth 3 (three) times daily as needed., Disp: , Rfl:    buprenorphine (SUBUTEX) 8 MG SUBL SL tablet, Place 8 mg under the tongue in the morning, at noon, and at bedtime., Disp: , Rfl:    candesartan (ATACAND) 4 MG tablet, Take 4 mg by mouth daily., Disp: , Rfl:    ELIQUIS 5 MG TABS tablet, Take 5 mg by mouth 2 (two) times daily., Disp: , Rfl:    LINZESS 290 MCG CAPS capsule, Take 290 mcg by mouth daily as needed., Disp: , Rfl:    metFORMIN (GLUCOPHAGE-XR) 500 MG 24 hr tablet, Take 1 tablet (500 mg total) by mouth in the morning and at bedtime., Disp: 180 tablet, Rfl: 3   methimazole (TAPAZOLE) 10 MG tablet, Take 1.5 tablets (15 mg total) by mouth daily., Disp: 135 tablet, Rfl: 1   metoprolol succinate (TOPROL-XL) 100 MG 24 hr tablet, Take 100 mg by mouth daily., Disp: , Rfl:    naloxone (NARCAN) nasal spray 4  mg/0.1 mL, Place 1 spray into the nose as needed., Disp: , Rfl:    pregabalin (LYRICA) 200 MG capsule, Take 200 mg by mouth 3 (three) times daily., Disp: , Rfl:    Semaglutide, 1 MG/DOSE, (OZEMPIC, 1 MG/DOSE,) 4 MG/3ML SOPN, Inject 1 mg into the skin., Disp: , Rfl:    Sertraline HCl 150 MG CAPS, Take 1 capsule by mouth daily., Disp: , Rfl:    spironolactone (ALDACTONE) 25 MG tablet, TAKE 1 TABLET (25 MG TOTAL) BY MOUTH DAILY., Disp: 30 tablet, Rfl: 3   TRESIBA FLEXTOUCH 100 UNIT/ML FlexTouch Pen, SMARTSIG:50 Unit(s) SUB-Q Daily, Disp: , Rfl:   Social History   Tobacco Use  Smoking Status Every Day   Packs/day: 0.50   Years: 30.00   Pack years: 15.00   Types: Cigarettes  Smokeless Tobacco Never    Allergies  Allergen Reactions   Other     Hazelnuts- SOB   Penicillin G     whelps   Trulicity [Dulaglutide]     Large itchy bumps   Objective:  There were no vitals filed for this visit. There is no height or weight on file to calculate BMI. Constitutional Well developed. Well nourished.  Vascular Foot warm and well perfused. Capillary refill normal to all digits.   Neurologic Normal speech. Oriented to person, place, and time. Epicritic sensation to light touch grossly present bilaterally.  Dermatologic Skin healing well without signs of infection. Skin edges well coapted without signs of infection. There is maceration around the area.   Orthopedic: Tenderness to palpation noted about the surgical site.   Radiographs: Distal phalanx hallux removed no osseous erosions noted.  Assessment:   1. Status post surgery     Plan:  Patient was evaluated and treated and all questions answered.  S/p foot surgery left -Progressing as expected post-operatively. -WB Status: WBAT in surgical shoe  -Sutures: removed. Some maceration around the area.  -Medications: n/a -Foot redressed. Betadine daily dressing changes.  Follow-up in 2 weeks  Return in about 2 weeks (around 04/04/2021)  for post op.

## 2021-03-23 ENCOUNTER — Telehealth: Payer: Self-pay | Admitting: Podiatry

## 2021-03-23 NOTE — Telephone Encounter (Signed)
CD with pt's x-rays have been placed in the black box up front

## 2021-04-05 ENCOUNTER — Ambulatory Visit (INDEPENDENT_AMBULATORY_CARE_PROVIDER_SITE_OTHER): Payer: 59 | Admitting: Podiatry

## 2021-04-05 ENCOUNTER — Other Ambulatory Visit: Payer: Self-pay

## 2021-04-05 ENCOUNTER — Encounter: Payer: Self-pay | Admitting: Podiatry

## 2021-04-05 DIAGNOSIS — L97522 Non-pressure chronic ulcer of other part of left foot with fat layer exposed: Secondary | ICD-10-CM

## 2021-04-05 DIAGNOSIS — Z9889 Other specified postprocedural states: Secondary | ICD-10-CM

## 2021-04-05 DIAGNOSIS — E08621 Diabetes mellitus due to underlying condition with foot ulcer: Secondary | ICD-10-CM

## 2021-04-05 NOTE — Progress Notes (Signed)
Subjective:  Patient ID: Carmen Armstrong, female    DOB: 1966/01/14,  MRN: [id number]  No chief complaint on file.   DOS: 03/01/21 Procedure: Left parital hallux amputation   56 y.o. female returns for POV#3. Doing well not having any pain. Has been dressing with betadine    Review of Systems: Negative except as noted in the HPI. Denies N/V/F/Ch.  Past Medical History:  Diagnosis Date   Arrhythmia    Atrial fibrillation (HCC)    CAD (coronary artery disease)    Chronic lower back pain    Diabetes mellitus without complication (HCC)    Eczema    GAD (generalized anxiety disorder)    H/O vitamin D deficiency    Heart failure (HCC)    Hyperlipidemia    Hypertension    Insomnia    PVD (peripheral vascular disease) (HCC)     Current Outpatient Medications:    ALPRAZolam (XANAX) 0.5 MG tablet, Take by mouth at bedtime., Disp: , Rfl:    atorvastatin (LIPITOR) 40 MG tablet, Take 40 mg by mouth daily., Disp: , Rfl:    baclofen (LIORESAL) 10 MG tablet, Take 10 mg by mouth 3 (three) times daily as needed., Disp: , Rfl:    buprenorphine (SUBUTEX) 8 MG SUBL SL tablet, Place 8 mg under the tongue in the morning, at noon, and at bedtime., Disp: , Rfl:    candesartan (ATACAND) 4 MG tablet, Take 4 mg by mouth daily., Disp: , Rfl:    ELIQUIS 5 MG TABS tablet, Take 5 mg by mouth 2 (two) times daily., Disp: , Rfl:    LINZESS 290 MCG CAPS capsule, Take 290 mcg by mouth daily as needed., Disp: , Rfl:    metFORMIN (GLUCOPHAGE-XR) 500 MG 24 hr tablet, Take 1 tablet (500 mg total) by mouth in the morning and at bedtime., Disp: 180 tablet, Rfl: 3   methimazole (TAPAZOLE) 10 MG tablet, Take 1.5 tablets (15 mg total) by mouth daily., Disp: 135 tablet, Rfl: 1   metoprolol succinate (TOPROL-XL) 100 MG 24 hr tablet, Take 100 mg by mouth daily., Disp: , Rfl:    naloxone (NARCAN) nasal spray 4 mg/0.1 mL, Place 1 spray into the nose as needed., Disp: , Rfl:    pregabalin (LYRICA) 200 MG capsule, Take  200 mg by mouth 3 (three) times daily., Disp: , Rfl:    Semaglutide, 1 MG/DOSE, (OZEMPIC, 1 MG/DOSE,) 4 MG/3ML SOPN, Inject 1 mg into the skin., Disp: , Rfl:    Sertraline HCl 150 MG CAPS, Take 1 capsule by mouth daily., Disp: , Rfl:    spironolactone (ALDACTONE) 25 MG tablet, TAKE 1 TABLET (25 MG TOTAL) BY MOUTH DAILY., Disp: 30 tablet, Rfl: 3   TRESIBA FLEXTOUCH 100 UNIT/ML FlexTouch Pen, SMARTSIG:50 Unit(s) SUB-Q Daily, Disp: , Rfl:   Social History   Tobacco Use  Smoking Status Every Day   Packs/day: 0.50   Years: 30.00   Pack years: 15.00   Types: Cigarettes  Smokeless Tobacco Never    Allergies  Allergen Reactions   Other     Hazelnuts- SOB   Penicillin G     whelps   Trulicity [Dulaglutide]     Large itchy bumps   Objective:  There were no vitals filed for this visit. There is no height or weight on file to calculate BMI. Constitutional Well developed. Well nourished.  Vascular Foot warm and well perfused. Capillary refill normal to all digits.   Neurologic Normal speech. Oriented to person, place, and time. Epicritic  sensation to light touch grossly present bilaterally.  Dermatologic Skin healing well without signs of infection. Skin edges well coapted without signs of infection. Maceration has improved but dome darkened hyperkeratotic tissue overlying with underlying superficial abrasion areas less than 1 cm.   Orthopedic: Tenderness to palpation noted about the surgical site.   Radiographs: Distal phalanx hallux removed no osseous erosions noted.  Assessment:   1. Status post surgery   2. Diabetic ulcer of toe of left foot associated with diabetes mellitus due to underlying condition, with fat layer exposed (HCC)      Plan:  Patient was evaluated and treated and all questions answered.  S/p foot surgery left -Progressing as expected post-operatively. -WB Status: WBAT in regular shoe  -Medications: n/a -May continue with betadine daily but does not need  dressing until small areas on dorsal toe heal up and scab off.  Follow-up in 4 weeks  No follow-ups on file.

## 2021-04-24 ENCOUNTER — Other Ambulatory Visit: Payer: Self-pay | Admitting: Student

## 2021-05-03 ENCOUNTER — Ambulatory Visit (INDEPENDENT_AMBULATORY_CARE_PROVIDER_SITE_OTHER): Payer: 59 | Admitting: Podiatry

## 2021-05-03 ENCOUNTER — Other Ambulatory Visit: Payer: Self-pay

## 2021-05-03 ENCOUNTER — Encounter: Payer: Self-pay | Admitting: Podiatry

## 2021-05-03 ENCOUNTER — Other Ambulatory Visit: Payer: Self-pay | Admitting: Family Medicine

## 2021-05-03 DIAGNOSIS — M79675 Pain in left toe(s): Secondary | ICD-10-CM

## 2021-05-03 DIAGNOSIS — E1142 Type 2 diabetes mellitus with diabetic polyneuropathy: Secondary | ICD-10-CM | POA: Diagnosis not present

## 2021-05-03 DIAGNOSIS — B351 Tinea unguium: Secondary | ICD-10-CM

## 2021-05-03 DIAGNOSIS — Z1231 Encounter for screening mammogram for malignant neoplasm of breast: Secondary | ICD-10-CM

## 2021-05-03 DIAGNOSIS — M79674 Pain in right toe(s): Secondary | ICD-10-CM

## 2021-05-03 NOTE — Progress Notes (Signed)
?Subjective:  ?Patient ID: Carmen Armstrong, female    DOB: June 11, 1965,  MRN: [id number] ? ?Chief Complaint  ?Patient presents with  ? Routine Post Op  ?  POV #4 DOS 03/01/21 LEFT PARTIAL HALLUX AMPUTATION. Pt states she has been doing well. Keeping there surgical area cleaned and dry.   ? ? ?DOS: 03/01/21 ?Procedure: Left parital hallux amputation  ? ?56 y.o. female returns for POV and here to return to normal routine at risk foot care. Patient relates the second toe has been getting more pressure. Requesting nail trim today. .  ? ?PCP: Rachell Cipro MD  ?  ? ?Review of Systems: Negative except as noted in the HPI. Denies N/V/F/Ch. ? ?Past Medical History:  ?Diagnosis Date  ? Arrhythmia   ? Atrial fibrillation ([place])   ? CAD (coronary artery disease)   ? Chronic lower back pain   ? Diabetes mellitus without complication ([place])   ? Eczema   ? GAD (generalized anxiety disorder)   ? H/O vitamin D deficiency   ? Heart failure ([place])   ? Hyperlipidemia   ? Hypertension   ? Insomnia   ? PVD (peripheral vascular disease) ([place])   ? ? ?Current Outpatient Medications:  ?  ALPRAZolam (XANAX) 0.5 MG tablet, Take by mouth at bedtime., Disp: , Rfl:  ?  atorvastatin (LIPITOR) 40 MG tablet, Take 40 mg by mouth daily., Disp: , Rfl:  ?  baclofen (LIORESAL) 10 MG tablet, Take 10 mg by mouth 3 (three) times daily as needed., Disp: , Rfl:  ?  buprenorphine (SUBUTEX) 8 MG SUBL SL tablet, Place 8 mg under the tongue in the morning, at noon, and at bedtime., Disp: , Rfl:  ?  candesartan (ATACAND) 4 MG tablet, Take 4 mg by mouth daily., Disp: , Rfl:  ?  ELIQUIS 5 MG TABS tablet, Take 5 mg by mouth 2 (two) times daily., Disp: , Rfl:  ?  LINZESS 290 MCG CAPS capsule, Take 290 mcg by mouth daily as needed., Disp: , Rfl:  ?  metFORMIN (GLUCOPHAGE-XR) 500 MG 24 hr tablet, Take 1 tablet (500 mg total) by mouth in the morning and at bedtime., Disp: 180 tablet, Rfl: 3 ?  methimazole (TAPAZOLE) 10 MG tablet, Take 1.5 tablets (15 mg total) by mouth  daily., Disp: 135 tablet, Rfl: 1 ?  metoprolol succinate (TOPROL-XL) 100 MG 24 hr tablet, Take 100 mg by mouth daily., Disp: , Rfl:  ?  naloxone (NARCAN) nasal spray 4 mg/0.1 mL, Place 1 spray into the nose as needed., Disp: , Rfl:  ?  pregabalin (LYRICA) 200 MG capsule, Take 200 mg by mouth 3 (three) times daily., Disp: , Rfl:  ?  Semaglutide, 1 MG/DOSE, (OZEMPIC, 1 MG/DOSE,) 4 MG/3ML SOPN, Inject 1 mg into the skin., Disp: , Rfl:  ?  Sertraline HCl 150 MG CAPS, Take 1 capsule by mouth daily., Disp: , Rfl:  ?  spironolactone (ALDACTONE) 25 MG tablet, TAKE 1 TABLET (25 MG TOTAL) BY MOUTH DAILY., Disp: 30 tablet, Rfl: 3 ?  TRESIBA FLEXTOUCH 100 UNIT/ML FlexTouch Pen, SMARTSIG:50 Unit(s) SUB-Q Daily, Disp: , Rfl:  ? ?Social History  ? ?Tobacco Use  ?Smoking Status Every Day  ? Packs/day: 0.50  ? Years: 30.00  ? Pack years: 15.00  ? Types: Cigarettes  ?Smokeless Tobacco Never  ? ? ?Allergies  ?Allergen Reactions  ? Other   ?  Hazelnuts- SOB  ? Penicillin G   ?  whelps  ? Trulicity [Dulaglutide]   ?  Large  itchy bumps  ? ?Objective:  ?Vascular: DP/PT pulses 2/4 bilateral. CFT <3 seconds. Absent hair growth on digits. Edema noted to bilateral lower extremities. Xerosis noted bilaterally.  ?Skin. No lacerations or abrasions bilateral feet. Nails 1-5 bilateral  are thickened discolored and elongated with subungual debris.  ?Musculoskeletal: MMT 5/5 bilateral lower extremities in DF, PF, Inversion and Eversion. Deceased ROM in DF of ankle joint. Amputation sites at first hallux and fifth toe on left well healed.  ?Neurological: Sensation intact to light touch. Protective sensation diminished bilateral.  ? ?Assessment:  ? ?1. Pain due to onychomycosis of toenails of both feet   ?2. Diabetic polyneuropathy associated with type 2 diabetes mellitus ([place])   ? ? ? ?Plan:  ?Patient was evaluated and treated and all questions answered. ? ?Surgical site well healed.  ? ?-Discussed and educated patient on diabetic foot care,  especially with  ?regards to the vascular, neurological and musculoskeletal systems.  ?-Stressed the importance of good glycemic control and the detriment of not  ?controlling glucose levels in relation to the foot. ?-Discussed supportive shoes at all times and checking feet regularly.  ?-Mechanically debrided all nails 1-5 bilateral using sterile nail nipper and filed with dremel without incident  ?-Answered all patient questions ?-Patient to return  in 3 months for at risk foot care ?-Patient advised to call the office if any problems or questions arise in the meantime. ? ?Return in about 3 months (around 08/03/2021) for rfc.  ? ?

## 2021-05-04 ENCOUNTER — Ambulatory Visit: Payer: 59 | Admitting: Internal Medicine

## 2021-05-04 ENCOUNTER — Encounter: Payer: Self-pay | Admitting: Internal Medicine

## 2021-05-04 VITALS — BP 120/80 | HR 60 | Ht 69.0 in | Wt 209.0 lb

## 2021-05-04 DIAGNOSIS — E0590 Thyrotoxicosis, unspecified without thyrotoxic crisis or storm: Secondary | ICD-10-CM | POA: Diagnosis not present

## 2021-05-04 DIAGNOSIS — E0500 Thyrotoxicosis with diffuse goiter without thyrotoxic crisis or storm: Secondary | ICD-10-CM | POA: Diagnosis not present

## 2021-05-04 LAB — T4, FREE: Free T4: 0.68 ng/dL (ref 0.60–1.60)

## 2021-05-04 LAB — TSH: TSH: 6.89 u[IU]/mL — ABNORMAL HIGH (ref 0.35–5.50)

## 2021-05-04 NOTE — Progress Notes (Signed)
? ?Name: Carmen Armstrong  ?MRN/ DOB: [id number], 1965/10/27    ?Age/ Sex: 56 y.o., female   ? ? ?PCP: [name], MD   ?Reason for Endocrinology Evaluation: Hyperthyroidism  ?   ?Initial Endocrinology Clinic Visit: 11/05/2019  ? ? ?PATIENT IDENTIFIER: Carmen Armstrong is a 56 y.o., female with a past medical history of HTN, T2DM, PAF , CHF and dyslipidemia. She has followed with [facility] since 11/05/2019 for consultative assistance with management of her Hyperthyroidism.  ? ? ? ? ?HISTORICAL SUMMARY:  ? ?She was diagnosed with hyperthyroidism in 10/2018 during routine workup . Two months prior to her presentation she was noted with dry eyes. Has occasional double vision, and is under the care of an ophthalmologist.  ? ?  ?Pt on Multaq  Since 2020. S/P ablation . Has not been on amiodarone.  ? ?Methimazole started 10/2019 ?  ?Paternal grandmother with thyroid disease  ?SUBJECTIVE:  ? ? ? ?Today (05/04/2021):  Carmen Armstrong is here for hyperthyroidism.  ? ?She has noted with wight loss  ?Denies abdominal , or vomiting  ?Denies constipation or diarrhea  ?Denies local neck symptoms  ?Sees  An eye specialist at  [facility] express - dry eyes  ?  ? ?Methimazole 10 mg , 1.5 tabs daily ? ? ? ?HISTORY:  ?Past Medical History:  ?Past Medical History:  ?Diagnosis Date  ? Arrhythmia   ? Atrial fibrillation ([place])   ? CAD (coronary artery disease)   ? Chronic lower back pain   ? Diabetes mellitus without complication ([place])   ? Eczema   ? GAD (generalized anxiety disorder)   ? H/O vitamin D deficiency   ? Heart failure ([place])   ? Hyperlipidemia   ? Hypertension   ? Insomnia   ? PVD (peripheral vascular disease) ([place])   ? ?Past Surgical History:  ?Past Surgical History:  ?Procedure Laterality Date  ? ANGIOPLASTY    ? legs  ? BACK SURGERY    ? 5 different times  ? CARDIAC CATHETERIZATION  2020  ? REPLACEMENT TOTAL HIP W/  RESURFACING IMPLANTS Right 2018  ? RIGHT AND LEFT HEART CATH    ? ROTATOR  CUFF REPAIR Right 2016  ? SPINE SURGERY  2014,2016,2017,2019  ? toe amputated  09/2018  ? 5th left toe  ? ?Social History:  reports that she has been smoking cigarettes. She has a 15.00 pack-year smoking history. She has never used smokeless tobacco. She reports that she does not currently use alcohol. She reports that she does not currently use drugs. ?Family History:  ?Family History  ?Problem Relation Age of Onset  ? Hypertension Mother   ? Hyperlipidemia Mother   ? Diabetes Mother   ? Stroke Mother   ? Clotting disorder Father   ? Hypertension Father   ? Hyperlipidemia Father   ? Hyperlipidemia Sister   ? Diabetes Brother   ? Neuropathy Brother   ? ? ? ?HOME MEDICATIONS: ?Allergies as of 05/04/2021   ? ?   Reactions  ? Other   ? Hazelnuts- SOB  ? Penicillin G   ? whelps  ? Trulicity [dulaglutide]   ? Large itchy bumps  ? ?  ? ?  ?Medication List  ?  ? ?  ? Accurate as of May 04, 2021  8:17 AM. If you have any questions, ask your nurse or doctor.  ?  ?  ? ?  ? ?ALPRAZolam 0.5 MG tablet ?Commonly known as: Duanne Moron ?Take by mouth  at bedtime. ?  ?atorvastatin 40 MG tablet ?Commonly known as: LIPITOR ?Take 40 mg by mouth daily. ?  ?baclofen 10 MG tablet ?Commonly known as: LIORESAL ?Take 10 mg by mouth 3 (three) times daily as needed. ?  ?buprenorphine 8 MG Subl SL tablet ?Commonly known as: SUBUTEX ?Place 8 mg under the tongue in the morning, at noon, and at bedtime. ?  ?candesartan 4 MG tablet ?Commonly known as: ATACAND ?Take 4 mg by mouth daily. ?  ?Eliquis 5 MG Tabs tablet ?Generic drug: apixaban ?Take 5 mg by mouth 2 (two) times daily. ?  ?Linzess 290 MCG Caps capsule ?Generic drug: linaclotide ?Take 290 mcg by mouth daily as needed. ?  ?metFORMIN 500 MG 24 hr tablet ?Commonly known as: GLUCOPHAGE-XR ?Take 1 tablet (500 mg total) by mouth in the morning and at bedtime. ?  ?methimazole 10 MG tablet ?Commonly known as: TAPAZOLE ?Take 1.5 tablets (15 mg total) by mouth daily. ?  ?metoprolol succinate 100 MG 24 hr  tablet ?Commonly known as: TOPROL-XL ?Take 100 mg by mouth daily. ?  ?naloxone 4 MG/0.1ML Liqd nasal spray kit ?Commonly known as: NARCAN ?Place 1 spray into the nose as needed. ?  ?Ozempic (1 MG/DOSE) 4 MG/3ML Sopn ?Generic drug: Semaglutide (1 MG/DOSE) ?Inject 1 mg into the skin. ?  ?pregabalin 200 MG capsule ?Commonly known as: LYRICA ?Take 200 mg by mouth 3 (three) times daily. ?  ?Sertraline HCl 150 MG Caps ?Take 1 capsule by mouth daily. ?  ?spironolactone 25 MG tablet ?Commonly known as: ALDACTONE ?TAKE 1 TABLET (25 MG TOTAL) BY MOUTH DAILY. ?  ?Tyler Aas FlexTouch 100 UNIT/ML FlexTouch Pen ?Generic drug: insulin degludec ?SMARTSIG:50 Unit(s) SUB-Q Daily ?  ? ?  ? ? ? ? ?OBJECTIVE:  ? ?PHYSICAL EXAM: ?VS: BP 120/80 (BP Location: Left Arm, Patient Position: Sitting, Cuff Size: Small)   Pulse 60   Ht 5' 9" (1.753 m)   Wt 209 lb (94.8 kg)   SpO2 95%   BMI 30.86 kg/m?  ? ? ? ?EXAM: ?General: Pt appears well and is in NAD  ?Eyes: External eye exam normal without stare, lid lag or exophthalmos.  EOM intact.   ?Neck: General: Supple without adenopathy. ?Thyroid: Thyroid size normal.Left thyroid asymmetry noted   ?Lungs: Clear with good BS bilat with no rales, rhonchi, or wheezes  ?Heart: Auscultation: RRR.  ?Abdomen: Normoactive bowel sounds, soft, nontender, without masses or organomegaly palpable  ?Extremities:  ?BL LE: no  pretibial edema   ?Mental Status: Judgment, insight: Intact ?Mood and affect: No depression, anxiety, or agitation  ? ? ? ?DATA REVIEWED: ? ? Latest Reference Range & Units 05/04/21 11:15  ?TSH 0.35 - 5.50 uIU/mL 6.89 (H)  ?T4,Free(Direct) 0.60 - 1.60 ng/dL 0.68  ? ?  ?Results for BLOSSIE, RAFFEL (MRN [id number]) as of 01/26/2020 13:34 ? Ref. Range 12/17/2019 10:24  ?TRAB Latest Ref Range: <=2.00 IU/L 30.55 (H)  ? ? ?ASSESSMENT / PLAN / RECOMMENDATIONS:  ? ?Hyperthyroidism Secondary to Graves' Disease:  ? ? ?- Pt is clinically euthyroid  ?- No local neck symptoms ?- Tolerating  methimazole without side effects  ?- TSH is elevated, will reduce methimazole as below ? ? ?Medications  ? ?Decrease Methimazole 10 mg , 1  tab daily ? ? ? ? ?2. Graves' Disease:  ? ?No extrathyroidal manifestations of  Graves' Disease. ? ? ? ? ? ?F/U in 4 months  ? ? ? ?Signed electronically by: ?Abby Nena Jordan, MD ? ?[facility]  ?[facility] ?[address] ?[place], [place] [address] ?Phone: [phone] ?FAX: [phone]  ? ? ? ? ?CC: ?[name], MD ?[address] ?[place] [address] ?Phone: [phone]  ?Fax: [phone] ? ? ?Return to Endocrinology clinic as below: ?Future Appointments  ?Date Time Provider [place]  ?05/04/2021 10:50 AM Amamda Curbow, Melanie Crazier, MD [facility] None  ?06/10/2021 10:20 AM [facility] MM 2 [facility]  ?07/12/2021 10:30 AM Cantwell, Celeste C, PA-C [facility] None  ?08/03/2021 10:15 AM Lorenda Peck, MD [facility]  ?  ? ?

## 2021-05-05 MED ORDER — METHIMAZOLE 10 MG PO TABS: 10.0000 mg | ORAL_TABLET | Freq: Every day | ORAL | 3 refills | Status: DC

## 2021-06-10 ENCOUNTER — Ambulatory Visit
Admission: RE | Admit: 2021-06-10 | Discharge: 2021-06-10 | Disposition: A | Discharge status: Home / Self Care | Payer: 59 | Source: Ambulatory Visit | Attending: Family Medicine | Admitting: Family Medicine

## 2021-06-10 ENCOUNTER — Ambulatory Visit: Payer: 59

## 2021-06-10 DIAGNOSIS — Z1231 Encounter for screening mammogram for malignant neoplasm of breast: Secondary | ICD-10-CM

## 2021-07-08 NOTE — Progress Notes (Unsigned)
Primary Physician/Referring:  [name], MD  Patient ID: Carmen Armstrong, female    DOB: 1965/10/03, 56 y.o.   MRN: [id number]  No chief complaint on file.  HPI:    Carmen Armstrong  is a 56 y.o. with hypertension, hyperlipidemia, type 2 DM, PAD, depression. History of ablation for atrial fibrillation in February and April 2021. She had ulcer of her left fifth toe, with worsening gangrene despite revascularization of right and left lower extremities, required amputation.   Patient was last seen in our office 01/11/2021 at which time aspirin was discontinued as she was on Eliquis given history of paroxysmal atrial fibrillation, otherwise no changes were made.  Following last office visit patient underwent repeat carotid artery surveillance, which remained stable. Patient now presents for 6 month follow up. ***  ***Needs carotid in June   Patient has chronic, stable neuropathy in her feet bilaterally, without claudication, nonhealing ulcers or wounds. She is presently on Eliquis and aspirin. She has been unable to tolerate Plavix in the past due to rash.  Patient has had no known recurrence of atrial fibrillation since last office visit and does not wish to be on antiarrhythmic therapy.  Notably she had previously been on Multaq prescribed by cardiologist in [place].  Patient does continue to have dyspnea on exertion which is unchanged compared to last office visit. Denies chest pain, palpitations, syncope, near syncope, dizziness, orthopnea, PND, leg swelling.  She is tolerating anticoagulation without bleeding diathesis.  Past Medical History:  Diagnosis Date   Arrhythmia    Atrial fibrillation (HCC)    CAD (coronary artery disease)    Chronic lower back pain    Diabetes mellitus without complication ([facility])    Eczema    GAD (generalized anxiety disorder)    H/O vitamin D deficiency    Heart failure ([facility])    Hyperlipidemia    Hypertension    Insomnia    PVD  (peripheral vascular disease) ([place])    Past Surgical History:  Procedure Laterality Date   ANGIOPLASTY     legs   BACK SURGERY     5 different times   CARDIAC CATHETERIZATION  2020   REPLACEMENT TOTAL HIP W/  RESURFACING IMPLANTS Right 2018   RIGHT AND LEFT HEART CATH     ROTATOR CUFF REPAIR Right 2016   SPINE SURGERY  2014,2016,2017,2019   toe amputated  09/2018   5th left toe   Family History  Problem Relation Age of Onset   Hypertension Mother    Hyperlipidemia Mother    Diabetes Mother    Stroke Mother    Clotting disorder Father    Hypertension Father    Hyperlipidemia Father    Hyperlipidemia Sister    Diabetes Brother    Neuropathy Brother     Social History   Tobacco Use   Smoking status: Every Day    Packs/day: 0.50    Years: 30.00    Pack years: 15.00    Types: Cigarettes   Smokeless tobacco: Never  Substance Use Topics   Alcohol use: Not Currently   Marital Status: Widowed   ROS  Review of Systems  Cardiovascular:  Positive for dyspnea on exertion (stbale). Negative for chest pain, claudication, leg swelling, near-syncope, orthopnea, palpitations, paroxysmal nocturnal dyspnea and syncope.  Hematologic/Lymphatic: Does not bruise/bleed easily.  Musculoskeletal:  Positive for back pain and joint pain.  Gastrointestinal:  Negative for melena.  Neurological:  Positive for paresthesias (Bilateral feet). Negative for dizziness and weakness.  Objective  There were no vitals taken for this visit.     05/04/2021   10:44 AM 01/11/2021   11:34 AM 01/03/2021   11:27 AM  Vitals with BMI  Height _0  _1  _2   Weight 209 lbs 232 lbs 226 lbs 10 oz  BMI 30.85 22.57 50.51  Systolic 833 582 518  Diastolic 80 47 62  Pulse 60 77 84      Physical Exam Vitals reviewed.  Constitutional:      Appearance: She is obese.  Neck:     Vascular: Carotid bruit (right > left ) present. No JVD.  Cardiovascular:     Rate and Rhythm: Normal rate and regular rhythm.      Pulses: Intact distal pulses.          Femoral pulses are 2+ on the right side and 2+ on the left side.      Popliteal pulses are 1+ on the right side and 1+ on the left side.       Dorsalis pedis pulses are 0 on the right side and 0 on the left side.       Posterior tibial pulses are 1+ on the right side and 0 on the left side.     Heart sounds: S1 normal and S2 normal. Murmur heard.  Harsh midsystolic murmur is present at the upper right sternal border radiating to the neck.    No gallop.     Comments: Weak/absent pedal pulses, but well perfused warm feet without any open ulcers, wounds, gangrene. Well-healed scar at left fifth toe s/p amputation Pulmonary:     Effort: Pulmonary effort is normal. No respiratory distress.     Breath sounds: No wheezing, rhonchi or rales.  Musculoskeletal:     Right lower leg: No edema.     Left lower leg: No edema.  Neurological:     Mental Status: She is alert.   Laboratory examination:   Recent Labs    09/07/20 1131 12/06/20 1134  NA 138 138  K 4.9 4.6  CL 106 102  CO2 23 29  GLUCOSE 210* 190*  BUN 15 9  CREATININE 0.83 0.61  CALCIUM 9.5 9.4    CrCl cannot be calculated (Patient's most recent lab result is older than the maximum 21 days allowed.).     Latest Ref Rng & Units 12/06/2020   11:34 AM 09/07/2020   11:31 AM 12/17/2019   10:24 AM  CMP  Glucose 70 - 99 mg/dL 190   210   132    BUN 6 - 24 mg/dL _3 Creatinine 0.57 - 1.00 mg/dL 0.61   0.83   0.74    Sodium 134 - 144 mmol/L 138   138   138    Potassium 3.5 - 5.2 mmol/L 4.6   4.9   4.6    Chloride 96 - 106 mmol/L 102   106   103    CO2 20 - 29 mmol/L _4 Calcium 8.7 - 10.2 mg/dL 9.4   9.5   9.4    Total Protein 6.0 - 8.3 g/dL  7.5   7.8    Total Bilirubin 0.2 - 1.2 mg/dL  0.4   0.4    Alkaline Phos 39 - 117 U/L  112   174    AST 0 - 37 U/L  12   13    ALT  0 - 35 U/L  11   12        Latest Ref Rng & Units 09/07/2020   11:31 AM 12/17/2019   10:24  AM  CBC  WBC 4.0 - 10.5 K/uL 5.6   6.5    Hemoglobin 12.0 - 15.0 g/dL 12.8   12.8    Hematocrit 36.0 - 46.0 % 40.9   40.6    Platelets 150.0 - 400.0 K/uL 177.0   236.0      Lipid Panel No results for input(s): CHOL, TRIG, LDLCALC, VLDL, HDL, CHOLHDL, LDLDIRECT in the last 8760 hours.  HEMOGLOBIN A1C Lab Results  Component Value Date   HGBA1C 8.2 (A) 01/03/2021   TSH Recent Labs    09/07/20 1131 01/03/21 1150 05/04/21 1115  TSH 2.16 0.43 6.89*    External labs:  01/27/2020: Hemoglobin 12.4, hematocrit 39.9, MCV 82, platelets 222 Total cholesterol 83, triglycerides 68, HDL 32, LDL 36 Glucose 92, BUN 23, creatinine 1.07, GFR 59, sodium 141, potassium 4.5, alk phos 178, CMP otherwise normal TSH 0.022  Allergies   Allergies  Allergen Reactions   Other     Hazelnuts- SOB   Penicillin G     whelps   Trulicity [Dulaglutide]     Large itchy bumps    Medications Prior to Visit:   Outpatient Medications Prior to Visit  Medication Sig Dispense Refill   ALPRAZolam (XANAX) 0.5 MG tablet Take by mouth at bedtime.     amLODipine-olmesartan (AZOR) 10-40 MG tablet Take 1 tablet by mouth daily.     atorvastatin (LIPITOR) 40 MG tablet Take 40 mg by mouth daily.     baclofen (LIORESAL) 10 MG tablet Take 10 mg by mouth 3 (three) times daily as needed.     buPROPion (WELLBUTRIN XL) 150 MG 24 hr tablet Take 150 mg by mouth every morning.     candesartan (ATACAND) 4 MG tablet Take 4 mg by mouth daily.     ELIQUIS 5 MG TABS tablet Take 5 mg by mouth 2 (two) times daily.     insulin degludec (TRESIBA FLEXTOUCH) 200 UNIT/ML FlexTouch Pen 40 Units.     LINZESS 290 MCG CAPS capsule Take 290 mcg by mouth daily as needed.     metFORMIN (GLUCOPHAGE-XR) 500 MG 24 hr tablet Take 1 tablet (500 mg total) by mouth in the morning and at bedtime. 180 tablet 3   methimazole (TAPAZOLE) 10 MG tablet Take 1 tablet (10 mg total) by mouth daily. 90 tablet 3   metoprolol succinate (TOPROL-XL) 100 MG 24 hr  tablet Take 100 mg by mouth daily.     morphine (MS CONTIN) 30 MG 12 hr tablet Take 30 mg by mouth every 8 (eight) hours.     naloxone (NARCAN) nasal spray 4 mg/0.1 mL Place 1 spray into the nose as needed.     OZEMPIC, 0.25 OR 0.5 MG/DOSE, 2 MG/1.5ML SOPN SMARTSIG:0.25 Milligram(s) SUB-Q Once a Week     pregabalin (LYRICA) 200 MG capsule Take 200 mg by mouth 3 (three) times daily.     Semaglutide, 1 MG/DOSE, (OZEMPIC, 1 MG/DOSE,) 4 MG/3ML SOPN Inject 1 mg into the skin.     spironolactone (ALDACTONE) 25 MG tablet TAKE 1 TABLET (25 MG TOTAL) BY MOUTH DAILY. 30 tablet 3   TRESIBA FLEXTOUCH 100 UNIT/ML FlexTouch Pen SMARTSIG:50 Unit(s) SUB-Q Daily     vitamin B-12 (CYANOCOBALAMIN) 1000 MCG tablet Take 1,000 mcg by mouth daily.     Vitamin D, Ergocalciferol, (DRISDOL) 1.25 MG (  50000 UNIT) CAPS capsule Take 50,000 Units by mouth once a week.     XIGDUO XR 06-998 MG TB24 Take 1 tablet by mouth 2 (two) times daily.     No facility-administered medications prior to visit.   Final Medications at End of Visit    No outpatient medications have been marked as taking for the 07/12/21 encounter (Appointment) with Rayetta Pigg, Cherese Lozano C, PA-C.   Radiology:   No results found. Coronary CTA 12/08/2020: 1. Total coronary calcium score of 457. This was 99th percentile for age and sex matched control. 2. Normal coronary origin with left dominance. 3. CAD-RADS = 3. Minimal stenosis (0-24%) at the ostial left main due to calcified plaque. Mild stenosis (25-49%) at distal left main/ostial LAD due to calcified plaque. Moderate stenosis (50-69%) at mid LAD due to calcified plaque. Mid to distal LAD is patent. LCX is patent with diffuse luminal irregularities no evidence of significant stenosis. Mild stenosis (25-49%) at proximal RCA due to calcified plaque. Mid to distal RCA is patent. 4.  Aortic Atherosclerosis.  Cardiac Studies:  Carotid artery duplex 01/17/2021:  Duplex suggests stenosis in the right  internal carotid artery (minimal).  Duplex suggests stenosis in the left internal carotid artery (50-69%).  Heterogeneous plaque bilateral carotid arteries.  Right vertebral artery flow is not visualized. Antegrade left vertebral artery flow.  No significant change from 06/03/2020. Follow up in six months is appropriate if clinically indicated.  PCV ECHOCARDIOGRAM COMPLETE 12/08/2020 Hyperdynamic LV systolic function with visual EF >70%. Left ventricle cavity is normal in size. Moderate left ventricular hypertrophy. Normal global wall motion. Normal diastolic filling pattern, normal LAP. Mild (Grade I) mitral regurgitation. Mild tricuspid regurgitation. No prior study for comparison.  ABI 09/05/2019:  This exam reveals mildly decreased perfusion of the right lower extremity, noted at the post tibial artery level with monophasic waveform(ABI 0.96).    The right anterior tibial artery shows dampened monophasic waveform suggestive of severe diffuse disease or occlusion.  This exam reveals mildly decreased perfusion of the left lower extremity, noted at the anterior tibial and post tibial artery level (ABI 0.96).   EKG:  ***   01/11/2021: Normal sinus rhythm at a rate of 70 bpm.  Normal axis.  No evidence of ischemia or underlying injury pattern.  Compared EKG 11/19/2020, no significant change  EKG 03/22/2020: Sinus rhythm at a rate of 82 bpm, left atrial enlargement.  Normal axis.  Cannot exclude anteroseptal infarct old.  Low voltage complexes.  EKG 09/02/2019: Sinus rhythm 81 bpm. Cannot exclude old anteroseptal infarct.  Assessment   No diagnosis found.    There are no discontinued medications.   No orders of the defined types were placed in this encounter.   Recommendations:   Carmen Armstrong is a 56 y.o.  with hypertension, hyperlipidemia, type 2 DM, PAD, depression. History of ablation for atrial fibrillation in February and April 2021. She had ulcer of her left fifth toe,  with worsening gangrene despite revascularization of right and left lower extremities, required amputation. *** CAD:  Reviewed and discussed with patient results of echocardiogram and coronary CTA, details above. Recommend medical management, could consider invasive angiography if patient continues to have dyspnea on exertion or anginal symptoms. Continue metoprolol, atorvastatin, candesartan.  Patient will discontinue aspirin as she is presently on Eliquis given history of atrial fibrillation.  PAD: ABI revealed mildly decreased perfusion of the left lower extremity, noted at the anterior tibial and post tibial artery level (ABI 0.96)  Continue statin therapy  Patient presently on Eliquis given history of paroxysmal atrial fibrillation, not on aspirin at this time. Patient unable to tolerate Plavix due to rash  Patient will notify our office if symptoms of claudication develop with discontinuation of aspirin.  However will stop Aspirin to reduce bleeding risk with Eliquis as patient denies PCI or angioplasty in the last 1 year.   Paroxysmal A. Fib: History of prior ablations.   Maintaining sinus rhythm. CHA2DS2VASc score 4, annual stroke risk 5%. Tolerating anticoagulation without bleeding diathesis  Continue Eliquis 5 mg twice daily   Hypertension: Well-controlled  Continue candesartan, metoprolol succinate, spironolactone, and furosemide.  Bilateral carotid artery stenosis:  We will schedule for carotid artery surveillance. Continue statin therapy.  Follow up in    [place], PA-C 07/08/2021, 1:02 PM Office: [phone]

## 2021-07-12 ENCOUNTER — Encounter: Payer: Self-pay | Admitting: Student

## 2021-07-12 ENCOUNTER — Ambulatory Visit: Payer: 59 | Admitting: Student

## 2021-07-12 VITALS — BP 165/80 | HR 65 | Temp 97.9°F | Resp 17 | Ht 69.0 in | Wt 206.2 lb

## 2021-07-12 DIAGNOSIS — I480 Paroxysmal atrial fibrillation: Secondary | ICD-10-CM

## 2021-07-12 DIAGNOSIS — I739 Peripheral vascular disease, unspecified: Secondary | ICD-10-CM

## 2021-07-12 DIAGNOSIS — I10 Essential (primary) hypertension: Secondary | ICD-10-CM

## 2021-07-12 DIAGNOSIS — I6523 Occlusion and stenosis of bilateral carotid arteries: Secondary | ICD-10-CM

## 2021-07-12 DIAGNOSIS — I2510 Atherosclerotic heart disease of native coronary artery without angina pectoris: Secondary | ICD-10-CM

## 2021-07-12 MED ORDER — AMLODIPINE BESYLATE 10 MG PO TABS: 10.0000 mg | ORAL_TABLET | Freq: Every day | ORAL | 3 refills | Status: DC

## 2021-07-20 ENCOUNTER — Other Ambulatory Visit: Payer: 59

## 2021-08-03 ENCOUNTER — Encounter: Payer: Self-pay | Admitting: Podiatry

## 2021-08-03 ENCOUNTER — Ambulatory Visit (INDEPENDENT_AMBULATORY_CARE_PROVIDER_SITE_OTHER): Payer: 59 | Admitting: Podiatry

## 2021-08-03 DIAGNOSIS — M79675 Pain in left toe(s): Secondary | ICD-10-CM

## 2021-08-03 DIAGNOSIS — M79674 Pain in right toe(s): Secondary | ICD-10-CM | POA: Diagnosis not present

## 2021-08-03 DIAGNOSIS — B351 Tinea unguium: Secondary | ICD-10-CM

## 2021-08-03 DIAGNOSIS — E1142 Type 2 diabetes mellitus with diabetic polyneuropathy: Secondary | ICD-10-CM | POA: Diagnosis not present

## 2021-08-03 NOTE — Progress Notes (Signed)
  Subjective:  Patient ID: Carmen Armstrong, female    DOB: 1965/08/20,   MRN: [id number]  Chief Complaint  Patient presents with   Diabetes    Diabetic foot care    56 y.o. female presents for concern of thickened elongated and painful nails that are difficult to trim. Requesting to have them trimmed today. Relates burning and tingling in their feet. Patient is diabetic and last A1c was  Lab Results  Component Value Date   HGBA1C 8.2 (A) 01/03/2021   .   PCP:  Lewis Moccasin, MD    . Denies any other pedal complaints. Denies n/v/f/c.   Past Medical History:  Diagnosis Date   Arrhythmia    Atrial fibrillation (HCC)    CAD (coronary artery disease)    Chronic lower back pain    Diabetes mellitus without complication (HCC)    Eczema    GAD (generalized anxiety disorder)    H/O vitamin D deficiency    Heart failure (HCC)    Hyperlipidemia    Hypertension    Insomnia    PVD (peripheral vascular disease) ([facility])     Objective:  Physical Exam: Vascular: DP/PT pulses 2/4 bilateral. CFT <3 seconds. Absent hair growth on digits. Edema noted to bilateral lower extremities. Xerosis noted bilaterally.  Skin. No lacerations or abrasions bilateral feet. Nails 1-5 bilateral  are thickened discolored and elongated with subungual debris.  Musculoskeletal: MMT 5/5 bilateral lower extremities in DF, PF, Inversion and Eversion. Deceased ROM in DF of ankle joint. 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  Neurological: Sensation intact to light touch. Protective sensation diminished bilateral.    Assessment:   1. Pain due to onychomycosis of toenails of both feet   2. Diabetic polyneuropathy associated with type 2 diabetes mellitus (HCC)      Plan:  Patient was evaluated and treated and all questions answered. -Discussed and educated patient on diabetic foot care, especially with  regards to the vascular, neurological and musculoskeletal systems.  -Stressed the  importance of good glycemic control and the detriment of not  controlling glucose levels in relation to the foot. -Discussed supportive shoes at all times and checking feet regularly.  -Mechanically debrided all nails 1-5 bilateral using sterile nail nipper and filed with dremel without incident  -Answered all patient questions -Patient to return  in 3 months for at risk foot care -Patient advised to call the office if any problems or questions arise in the meantime.   Louann Sjogren, DPM

## 2021-08-09 DIAGNOSIS — M25561 Pain in right knee: Secondary | ICD-10-CM | POA: Insufficient documentation

## 2021-08-09 DIAGNOSIS — M25551 Pain in right hip: Secondary | ICD-10-CM | POA: Insufficient documentation

## 2021-08-19 NOTE — Progress Notes (Deleted)
Primary Physician/Referring:  [name], MD  Patient ID: Carmen Armstrong, female    DOB: April 28, 1965, 56 y.o.   MRN: [id number]  No chief complaint on file.  HPI:    Carmen Armstrong  is a 56 y.o. with hypertension, hyperlipidemia, type 2 DM, PAD, depression. History of ablation for atrial fibrillation in February and April 2021. She had ulcer of her left fifth toe, with worsening gangrene despite revascularization of right and left lower extremities, required amputation.   Patient was last seen in the office 07/12/2021 at which time ordered lower arterial duplex given worsening foot pain. Also added amlodipine at that time due to uncontrolled hypertension. ***Carotid and lower arterial duplex have not been done as ordered. Patient now presents for 57 week follow up. ***  ***  Patient was last seen in our office 01/11/2021 at which time aspirin was discontinued as she was on Eliquis given history of paroxysmal atrial fibrillation, otherwise no changes were made.  Following last office visit patient underwent repeat carotid artery surveillance, which remained stable. Patient now presents for 57 month follow up.  Patient has been dealing with intermittent orthopedic issues and recently established with [facility] medical pain management.  Her primary concern today is intermittent headaches over the last 1 to 2 weeks.  Denies symptoms suggestive of CVA or TIA.  Denies chest pain, dyspnea, palpitations.  She continues to tolerate anticoagulation without bleeding diathesis.  Patient has lost approximately 5 pounds since November 2022, she is congratulated on this.  She has chronic stable neuropathy in her feet bilaterally without evidence of nonhealing ulcers or wounds.  Notably patient was recently referred to [place] cardiologist who ordered echocardiogram, will request these records.  Past Medical History:  Diagnosis Date   Arrhythmia    Atrial fibrillation (HCC)    CAD  (coronary artery disease)    Chronic lower back pain    Diabetes mellitus without complication (HCC)    Eczema    GAD (generalized anxiety disorder)    H/O vitamin D deficiency    Heart failure (HCC)    Hyperlipidemia    Hypertension    Insomnia    PVD (peripheral vascular disease) ([place])    Past Surgical History:  Procedure Laterality Date   ANGIOPLASTY     legs   BACK SURGERY     5 different times   CARDIAC CATHETERIZATION  2020   REPLACEMENT TOTAL HIP W/  RESURFACING IMPLANTS Right 2018   RIGHT AND LEFT HEART CATH     ROTATOR CUFF REPAIR Right 2016   SPINE SURGERY  2014,2016,2017,2019   toe amputated  09/2018   5th left toe   Family History  Problem Relation Age of Onset   Hypertension Mother    Hyperlipidemia Mother    Diabetes Mother    Stroke Mother    Clotting disorder Father    Hypertension Father    Hyperlipidemia Father    Hyperlipidemia Sister    Diabetes Brother    Neuropathy Brother     Social History   Tobacco Use   Smoking status: Every Day    Packs/day: 0.50    Years: 30.00    Total pack years: 15.00    Types: Cigarettes   Smokeless tobacco: Never  Substance Use Topics   Alcohol use: Not Currently   Marital Status: Widowed   ROS  Review of Systems  Eyes:  Negative for visual disturbance.  Cardiovascular:  Positive for dyspnea on exertion (stbale). Negative for chest pain,  claudication, leg swelling, near-syncope, orthopnea, palpitations, paroxysmal nocturnal dyspnea and syncope.  Hematologic/Lymphatic: Does not bruise/bleed easily.  Musculoskeletal:  Positive for back pain and joint pain.  Gastrointestinal:  Negative for melena.  Neurological:  Positive for headaches (intermittent for the last 1-2 weeks) and paresthesias (Bilateral feet). Negative for dizziness.    Objective  There were no vitals taken for this visit.     07/12/2021   10:57 AM 07/12/2021   10:29 AM 07/12/2021   10:16 AM  Vitals with BMI  Height   '5\' 9"'   Weight   206  lbs 3 oz  BMI   35.70  Systolic 177 939 030  Diastolic 80 84 67  Pulse 65 69 73      Physical Exam Vitals reviewed.  Constitutional:      Appearance: She is obese.  Neck:     Vascular: Carotid bruit (right > left ) present. No JVD.  Cardiovascular:     Rate and Rhythm: Normal rate and regular rhythm.     Pulses: Intact distal pulses.          Femoral pulses are 2+ on the right side and 2+ on the left side.      Popliteal pulses are 1+ on the right side and 1+ on the left side.       Dorsalis pedis pulses are 0 on the right side and 0 on the left side.       Posterior tibial pulses are 1+ on the right side and 0 on the left side.     Heart sounds: S1 normal and S2 normal. Murmur heard.     Harsh midsystolic murmur is present at the upper right sternal border radiating to the neck.     No gallop.     Comments: Weak/absent pedal pulses, but well perfused warm feet without any open ulcers, wounds, gangrene. Well-healed scar at left fifth toe s/p amputation Pulmonary:     Effort: Pulmonary effort is normal. No respiratory distress.     Breath sounds: No wheezing, rhonchi or rales.  Musculoskeletal:     Right lower leg: No edema.     Left lower leg: No edema.  Neurological:     Mental Status: She is alert.   Physical exam is unchanged compared to previous office visit.   Laboratory examination:   Recent Labs    09/07/20 1131 12/06/20 1134  NA 138 138  K 4.9 4.6  CL 106 102  CO2 23 29  GLUCOSE 210* 190*  BUN 15 9  CREATININE 0.83 0.61  CALCIUM 9.5 9.4    CrCl cannot be calculated (Patient's most recent lab result is older than the maximum 21 days allowed.).     Latest Ref Rng & Units 12/06/2020   11:34 AM 09/07/2020   11:31 AM 12/17/2019   10:24 AM  CMP  Glucose 70 - 99 mg/dL 190  210  132   BUN 6 - 24 mg/dL '9  15  9   ' Creatinine 0.57 - 1.00 mg/dL 0.61  0.83  0.74   Sodium 134 - 144 mmol/L 138  138  138   Potassium 3.5 - 5.2 mmol/L 4.6  4.9  4.6   Chloride 96 -  106 mmol/L 102  106  103   CO2 20 - 29 mmol/L '29  23  28   ' Calcium 8.7 - 10.2 mg/dL 9.4  9.5  9.4   Total Protein 6.0 - 8.3 g/dL  7.5  7.8   Total  Bilirubin 0.2 - 1.2 mg/dL  0.4  0.4   Alkaline Phos 39 - 117 U/L  112  174   AST 0 - 37 U/L  12  13   ALT 0 - 35 U/L  11  12       Latest Ref Rng & Units 09/07/2020   11:31 AM 12/17/2019   10:24 AM  CBC  WBC 4.0 - 10.5 K/uL 5.6  6.5   Hemoglobin 12.0 - 15.0 g/dL 12.8  12.8   Hematocrit 36.0 - 46.0 % 40.9  40.6   Platelets 150.0 - 400.0 K/uL 177.0  236.0     Lipid Panel No results for input(s): "CHOL", "TRIG", "[place]", "VLDL", "HDL", "CHOLHDL", "LDLDIRECT" in the last 8760 hours.  HEMOGLOBIN A1C Lab Results  Component Value Date   HGBA1C 8.2 (A) 01/03/2021   TSH Recent Labs    09/07/20 1131 01/03/21 1150 05/04/21 1115  TSH 2.16 0.43 6.89*    External labs:  01/27/2020: Hemoglobin 12.4, hematocrit 39.9, MCV 82, platelets 222 Total cholesterol 83, triglycerides 68, HDL 32, LDL 36 Glucose 92, BUN 23, creatinine 1.07, GFR 59, sodium 141, potassium 4.5, alk phos 178, CMP otherwise normal TSH 0.022  Allergies   Allergies  Allergen Reactions   Other     Hazelnuts- SOB   Penicillin G     whelps   Trulicity [Dulaglutide]     Large itchy bumps    Medications Prior to Visit:   Outpatient Medications Prior to Visit  Medication Sig Dispense Refill   ALPRAZolam (XANAX) 0.5 MG tablet Take by mouth at bedtime.     amLODipine (NORVASC) 10 MG tablet Take 1 tablet (10 mg total) by mouth daily. 90 tablet 3   atorvastatin (LIPITOR) 40 MG tablet Take 40 mg by mouth daily.     baclofen (LIORESAL) 10 MG tablet Take 10 mg by mouth 3 (three) times daily as needed.     buPROPion (WELLBUTRIN XL) 150 MG 24 hr tablet Take 150 mg by mouth every morning.     candesartan (ATACAND) 4 MG tablet Take 4 mg by mouth daily.     ELIQUIS 5 MG TABS tablet Take 5 mg by mouth 2 (two) times daily.     folic acid (FOLVITE) 1 MG tablet Take 1 mg by mouth  daily.     insulin degludec (TRESIBA FLEXTOUCH) 200 UNIT/ML FlexTouch Pen 40 Units.     LINZESS 290 MCG CAPS capsule Take 290 mcg by mouth daily as needed.     methimazole (TAPAZOLE) 10 MG tablet Take 1 tablet (10 mg total) by mouth daily. 90 tablet 3   metoprolol succinate (TOPROL-XL) 100 MG 24 hr tablet Take 100 mg by mouth daily.     morphine (MS CONTIN) 30 MG 12 hr tablet Take 30 mg by mouth every 8 (eight) hours.     naloxone (NARCAN) nasal spray 4 mg/0.1 mL Place 1 spray into the nose as needed.     omeprazole (PRILOSEC) 20 MG capsule Take 20 mg by mouth daily.     OZEMPIC, 0.25 OR 0.5 MG/DOSE, 2 MG/1.5ML SOPN SMARTSIG:0.25 Milligram(s) SUB-Q Once a Week     pregabalin (LYRICA) 200 MG capsule Take 200 mg by mouth 3 (three) times daily.     sertraline (ZOLOFT) 100 MG tablet Take 100 mg by mouth 2 (two) times daily.     spironolactone (ALDACTONE) 25 MG tablet TAKE 1 TABLET (25 MG TOTAL) BY MOUTH DAILY. 30 tablet 3   vitamin B-12 (CYANOCOBALAMIN) 1000  MCG tablet Take 1,000 mcg by mouth daily.     Vitamin D, Ergocalciferol, (DRISDOL) 1.25 MG (50000 UNIT) CAPS capsule Take 50,000 Units by mouth once a week.     XIGDUO XR 06-998 MG TB24 Take 1 tablet by mouth 2 (two) times daily.     No facility-administered medications prior to visit.   Final Medications at End of Visit    No outpatient medications have been marked as taking for the 08/29/21 encounter (Appointment) with Rayetta Pigg, Texanna Hilburn C, PA-C.   Radiology:   No results found. Coronary CTA 12/08/2020: 1. Total coronary calcium score of 457. This was 99th percentile for age and sex matched control. 2. Normal coronary origin with left dominance. 3. CAD-RADS = 3. Minimal stenosis (0-24%) at the ostial left main due to calcified plaque. Mild stenosis (25-49%) at distal left main/ostial LAD due to calcified plaque. Moderate stenosis (50-69%) at mid LAD due to calcified plaque. Mid to distal LAD is patent. LCX is patent with diffuse  luminal irregularities no evidence of significant stenosis. Mild stenosis (25-49%) at proximal RCA due to calcified plaque. Mid to distal RCA is patent. 4.  Aortic Atherosclerosis.  Cardiac Studies:  Carotid artery duplex 01/17/2021:  Duplex suggests stenosis in the right internal carotid artery (minimal).  Duplex suggests stenosis in the left internal carotid artery (50-69%).  Heterogeneous plaque bilateral carotid arteries.  Right vertebral artery flow is not visualized. Antegrade left vertebral artery flow.  No significant change from 06/03/2020. Follow up in six months is appropriate if clinically indicated.  PCV ECHOCARDIOGRAM COMPLETE 12/08/2020 Hyperdynamic LV systolic function with visual EF >70%. Left ventricle cavity is normal in size. Moderate left ventricular hypertrophy. Normal global wall motion. Normal diastolic filling pattern, normal LAP. Mild (Grade I) mitral regurgitation. Mild tricuspid regurgitation. No prior study for comparison.  ABI 09/05/2019:  This exam reveals mildly decreased perfusion of the right lower extremity, noted at the post tibial artery level with monophasic waveform(ABI 0.96).    The right anterior tibial artery shows dampened monophasic waveform suggestive of severe diffuse disease or occlusion.  This exam reveals mildly decreased perfusion of the left lower extremity, noted at the anterior tibial and post tibial artery level (ABI 0.96).   *** EKG:  07/12/2021: Sinus rhythm at a rate of 70 bpm.  Normal axis.  No evidence of underlying ischemia or injury pattern.  Compared EKG 01/11/2021, no significant change.  EKG 03/22/2020: Sinus rhythm at a rate of 82 bpm, left atrial enlargement.  Normal axis.  Cannot exclude anteroseptal infarct old.  Low voltage complexes.  EKG 09/02/2019: Sinus rhythm 81 bpm. Cannot exclude old anteroseptal infarct.  Assessment   No diagnosis found.    There are no discontinued medications.   No orders of the defined  types were placed in this encounter.   Recommendations:   Carmen Armstrong is a 56 y.o.  with hypertension, hyperlipidemia, type 2 DM, PAD, depression. History of ablation for atrial fibrillation in February and April 2021. She had ulcer of her left fifth toe, with worsening gangrene despite revascularization of right and left lower extremities, required amputation. *** CAD:  Nonobstructive CAD per coronary CTA 11/2020 with calcium score 457 placing patient in the 99th percentile for age and sex Continue metoprolol, atorvastatin, candesartan.  Patient is presently not on aspirin given ongoing use of Eliquis in the setting of paroxysmal atrial fibrillation.  PAD: ABI revealed mildly decreased perfusion of the left lower extremity, noted at the anterior tibial and post tibial artery  level (ABI 0.96)  Continue statin therapy Patient presently on Eliquis given history of paroxysmal atrial fibrillation, not on aspirin at this time. Patient unable to tolerate Plavix due to rash  Given worsening foot pain with walking on the right side will obtain lower extremity arterial duplex with ABI to further evaluate underlying PAD.   Paroxysmal A. Fib: History of prior ablations.   Maintaining sinus rhythm. CHA2DS2VASc score 4, annual stroke risk 5%. Tolerating anticoagulation without bleeding diathesis  Continue Eliquis 5 mg twice daily   Hypertension: Uncontrolled. Continue candesartan, metoprolol succinate, spironolactone Start amlodipine 10 mg p.o. daily. She will monitor blood pressure daily and bring a written log to her next appointment.  Bilateral carotid artery stenosis:  Patient is due for repeat carotid artery surveillance, this has been ordered accordingly. Continue statin therapy  Follow-up in 6 weeks, sooner if needed, for cardiac test results and hypertension management.    Alethia Berthold, PA-C 08/19/2021, 2:51 PM Office: [phone]

## 2021-08-29 ENCOUNTER — Ambulatory Visit: Payer: 59 | Admitting: Student

## 2021-09-06 ENCOUNTER — Encounter: Payer: Self-pay | Admitting: Internal Medicine

## 2021-09-06 ENCOUNTER — Ambulatory Visit (INDEPENDENT_AMBULATORY_CARE_PROVIDER_SITE_OTHER): Payer: 59 | Admitting: Internal Medicine

## 2021-09-06 VITALS — BP 136/78 | HR 80 | Ht 69.0 in | Wt 212.6 lb

## 2021-09-06 DIAGNOSIS — E0590 Thyrotoxicosis, unspecified without thyrotoxic crisis or storm: Secondary | ICD-10-CM | POA: Diagnosis not present

## 2021-09-06 DIAGNOSIS — E0500 Thyrotoxicosis with diffuse goiter without thyrotoxic crisis or storm: Secondary | ICD-10-CM

## 2021-09-06 LAB — T4, FREE: Free T4: 0.62 ng/dL (ref 0.60–1.60)

## 2021-09-06 LAB — TSH: TSH: 5.96 u[IU]/mL — ABNORMAL HIGH (ref 0.35–5.50)

## 2021-09-06 MED ORDER — METHIMAZOLE 5 MG PO TABS
5.0000 mg | ORAL_TABLET | Freq: Every day | ORAL | 2 refills | Status: DC
Start: 2021-09-06 — End: 2022-03-10

## 2021-09-06 NOTE — Progress Notes (Signed)
Name: Kursten Kruk  MRN/ DOB: [id number], 1966-02-07    Age/ Sex: 56 y.o., female     PCP: [name], MD   Reason for Endocrinology Evaluation: Hyperthyroidism     Initial Endocrinology Clinic Visit: 11/05/2019    PATIENT IDENTIFIER: Ms. Marguita Venning is a 56 y.o., female with a past medical history of HTN, T2DM, PAF , CHF and dyslipidemia. She has followed with [facility] since 11/05/2019 for consultative assistance with management of her Hyperthyroidism.      HISTORICAL SUMMARY:   She was diagnosed with hyperthyroidism in 10/2018 during routine workup . Two months prior to her presentation she was noted with dry eyes. Had occasional double vision, and is under the care of an ophthalmologist.     Pt on Multaq  Since 2020. S/P ablation . Has not been on amiodarone since 2021  Methimazole started 10/2019   Paternal grandmother with thyroid disease  SUBJECTIVE:     Today (09/06/2021):  Ms. Bonfiglio is here for hyperthyroidism.    Her weight continues to fluctuate   Denies abdominal , or vomiting  Denies constipation or diarrhea  Denies local neck symptoms  Denies tremors  Denies palpations  Sees an eye specialist at  [facility] - dry eyes     Methimazole 10 mg , 1 tabs daily    HISTORY:  Past Medical History:  Past Medical History:  Diagnosis Date   Arrhythmia    Atrial fibrillation ([place])    CAD (coronary artery disease)    Chronic lower back pain    Diabetes mellitus without complication ([place])    Eczema    GAD (generalized anxiety disorder)    H/O vitamin D deficiency    Heart failure ([place])    Hyperlipidemia    Hypertension    Insomnia    PVD (peripheral vascular disease) ([place])    Past Surgical History:  Past Surgical History:  Procedure Laterality Date   ANGIOPLASTY     legs   BACK SURGERY     5 different times   CARDIAC CATHETERIZATION  2020   REPLACEMENT TOTAL HIP W/  RESURFACING IMPLANTS Right  2018   RIGHT AND LEFT HEART CATH     ROTATOR CUFF REPAIR Right 2016   SPINE SURGERY  2014,2016,2017,2019   toe amputated  09/2018   5th left toe   Social History:  reports that she has been smoking cigarettes. She has a 15.00 pack-year smoking history. She has never used smokeless tobacco. She reports that she does not currently use alcohol. She reports that she does not currently use drugs. Family History:  Family History  Problem Relation Age of Onset   Hypertension Mother    Hyperlipidemia Mother    Diabetes Mother    Stroke Mother    Clotting disorder Father    Hypertension Father    Hyperlipidemia Father    Hyperlipidemia Sister    Diabetes Brother    Neuropathy Brother      HOME MEDICATIONS: Allergies as of 09/06/2021       Reactions   Other    Hazelnuts- SOB   Penicillin G    whelps   Trulicity [dulaglutide]    Large itchy bumps        Medication List        Accurate as of September 06, 2021 10:56 AM. If you have any questions, ask your nurse or doctor.          STOP taking these medications  pregabalin 200 MG capsule Commonly known as: LYRICA Stopped by: Dorita Sciara, MD       TAKE these medications    ALPRAZolam 0.5 MG tablet Commonly known as: XANAX Take by mouth at bedtime.   amLODipine 10 MG tablet Commonly known as: NORVASC Take 1 tablet (10 mg total) by mouth daily.   atorvastatin 40 MG tablet Commonly known as: LIPITOR Take 40 mg by mouth daily.   baclofen 10 MG tablet Commonly known as: LIORESAL Take 10 mg by mouth 3 (three) times daily as needed.   buPROPion 150 MG 24 hr tablet Commonly known as: WELLBUTRIN XL Take 150 mg by mouth every morning.   candesartan 4 MG tablet Commonly known as: ATACAND Take 4 mg by mouth daily.   Eliquis 5 MG Tabs tablet Generic drug: apixaban Take 5 mg by mouth 2 (two) times daily.   folic acid 1 MG tablet Commonly known as: FOLVITE Take 1 mg by mouth daily.   Linzess 290 MCG  Caps capsule Generic drug: linaclotide Take 290 mcg by mouth daily as needed.   methimazole 10 MG tablet Commonly known as: TAPAZOLE Take 1 tablet (10 mg total) by mouth daily.   metoprolol succinate 100 MG 24 hr tablet Commonly known as: TOPROL-XL Take 100 mg by mouth daily.   morphine 30 MG 12 hr tablet Commonly known as: MS CONTIN Take 30 mg by mouth every 8 (eight) hours.   naloxone 4 MG/0.1ML Liqd nasal spray kit Commonly known as: NARCAN Place 1 spray into the nose as needed.   omeprazole 20 MG capsule Commonly known as: PRILOSEC Take 20 mg by mouth daily.   Ozempic (0.25 or 0.5 MG/DOSE) 2 MG/1.5ML Sopn Generic drug: Semaglutide(0.25 or 0.5MG/DOS) SMARTSIG:0.25 Milligram(s) SUB-Q Once a Week   sertraline 100 MG tablet Commonly known as: ZOLOFT Take 100 mg by mouth 2 (two) times daily.   spironolactone 25 MG tablet Commonly known as: ALDACTONE TAKE 1 TABLET (25 MG TOTAL) BY MOUTH DAILY.   Tyler Aas FlexTouch 200 UNIT/ML FlexTouch Pen Generic drug: insulin degludec 40 Units.   vitamin B-12 1000 MCG tablet Commonly known as: CYANOCOBALAMIN Take 1,000 mcg by mouth daily.   Vitamin D (Ergocalciferol) 1.25 MG (50000 UNIT) Caps capsule Commonly known as: DRISDOL Take 50,000 Units by mouth once a week.   Xigduo XR 06-998 MG Tb24 Generic drug: Dapagliflozin-metFORMIN HCl ER Take 1 tablet by mouth 2 (two) times daily.          OBJECTIVE:   PHYSICAL EXAM: VS: BP 136/78 (BP Location: Left Arm, Patient Position: Sitting, Cuff Size: Small)   Pulse 80   Ht _0  (1.753 m)   Wt 212 lb 9.6 oz (96.4 kg)   SpO2 98%   BMI 31.40 kg/m     EXAM: General: Pt appears well and is in NAD  Eyes: External eye exam normal without stare, lid lag or exophthalmos.  EOM intact.   Neck: General: Supple without adenopathy. Thyroid: Thyroid size normal.Left thyroid asymmetry noted   Lungs: Clear with good BS bilat with no rales, rhonchi, or wheezes  Heart: Auscultation:  RRR.  Abdomen: Normoactive bowel sounds, soft, nontender, without masses or organomegaly palpable  Extremities:  BL LE: no  pretibial edema   Mental Status: Judgment, insight: Intact Mood and affect: No depression, anxiety, or agitation     DATA REVIEWED:   Latest Reference Range & Units 09/06/21 11:07  TSH 0.35 - 5.50 uIU/mL 5.96 (H)  T4,Free(Direct) 0.60 - 1.60 ng/dL 0.62  Results for ZAHARAH, AMIR (MRN [id number]) as of 01/26/2020 13:34  Ref. Range 12/17/2019 10:24  TRAB Latest Ref Range: <=2.00 IU/L 30.55 (H)    ASSESSMENT / PLAN / RECOMMENDATIONS:   Hyperthyroidism Secondary to Graves' Disease:    - Pt is clinically euthyroid  - No local neck symptoms - Tolerating methimazole without side effects  - TSH remains elevated, will reduce methimazole as below   Medications   Stop methimazole 10 mg , 1  tab daily Start methimazole 5 mg daily   2. Graves' Disease:   No extrathyroidal manifestations of  Graves' Disease.      F/U in 6 months     Signed electronically by: Mack Guise, MD  [facility]  [facility] [address] [place], [place] [address] Phone: [phone] FAX: [phone]      CC: [name], MD [address] [place] [address] Phone: [phone]  Fax: [phone]   Return to Endocrinology clinic as below: Future Appointments  Date Time Provider [place]  10/03/2021  1:45 PM PCV-ECHO/VAS 1 PCV-IMG None  10/03/2021  2:30 PM PCV-ECHO/VAS 1 PCV-IMG None  10/10/2021  9:30 AM Rex Kras, DO PCV-PCV None  11/02/2021 10:45 AM Lorenda Peck, DPM [facility]

## 2021-09-07 LAB — T3: T3, Total: 93 ng/dL (ref 76–181)

## 2021-09-13 ENCOUNTER — Encounter: Payer: Self-pay | Admitting: Podiatry

## 2021-10-03 ENCOUNTER — Ambulatory Visit: Payer: 59

## 2021-10-03 DIAGNOSIS — I6523 Occlusion and stenosis of bilateral carotid arteries: Secondary | ICD-10-CM

## 2021-10-03 DIAGNOSIS — I739 Peripheral vascular disease, unspecified: Secondary | ICD-10-CM

## 2021-10-09 NOTE — Progress Notes (Signed)
You are seeing the patient soon

## 2021-10-09 NOTE — Progress Notes (Signed)
You are seeing the patient soon

## 2021-10-10 ENCOUNTER — Ambulatory Visit: Payer: 59 | Admitting: Cardiology

## 2021-10-31 ENCOUNTER — Encounter: Payer: Self-pay | Admitting: Podiatry

## 2021-10-31 ENCOUNTER — Ambulatory Visit (INDEPENDENT_AMBULATORY_CARE_PROVIDER_SITE_OTHER): Payer: 59 | Admitting: Podiatry

## 2021-10-31 DIAGNOSIS — E1142 Type 2 diabetes mellitus with diabetic polyneuropathy: Secondary | ICD-10-CM | POA: Diagnosis not present

## 2021-10-31 DIAGNOSIS — B351 Tinea unguium: Secondary | ICD-10-CM

## 2021-10-31 DIAGNOSIS — M79675 Pain in left toe(s): Secondary | ICD-10-CM | POA: Diagnosis not present

## 2021-10-31 DIAGNOSIS — D689 Coagulation defect, unspecified: Secondary | ICD-10-CM

## 2021-10-31 DIAGNOSIS — M79674 Pain in right toe(s): Secondary | ICD-10-CM

## 2021-10-31 NOTE — Progress Notes (Signed)
  Subjective:  Patient ID: Carmen Armstrong, female    DOB: 09-22-1965,   MRN: [id number]  Chief Complaint  Patient presents with   Foot Problem    [facility]    56 y.o. female presents for concern of thickened elongated and painful nails that are difficult to trim. Requesting to have them trimmed today. Relates burning and tingling in their feet. Patient is diabetic and last A1c was  Lab Results  Component Value Date   HGBA1C 8.2 (A) 01/03/2021   .   PCP:  [name], MD    . Denies any other pedal complaints. Denies n/v/f/c.   Past Medical History:  Diagnosis Date   Arrhythmia    Atrial fibrillation ([place])    CAD (coronary artery disease)    Chronic lower back pain    Diabetes mellitus without complication (HCC)    Eczema    GAD (generalized anxiety disorder)    H/O vitamin D deficiency    Heart failure (HCC)    Hyperlipidemia    Hypertension    Insomnia    PVD (peripheral vascular disease) (HCC)     Objective:  Physical Exam: Vascular: DP/PT pulses 2/4 bilateral. CFT <3 seconds. Absent hair growth on digits. Edema noted to bilateral lower extremities. Xerosis noted bilaterally.  Skin. No lacerations or abrasions bilateral feet. Nails 1-5 bilateral  are thickened discolored and elongated with subungual debris.  Musculoskeletal: MMT 5/5 bilateral lower extremities in DF, PF, Inversion and Eversion. Deceased ROM in DF of ankle joint. 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  Neurological: Sensation intact to light touch. Protective sensation diminished bilateral.    Assessment:   1. Pain due to onychomycosis of toenails of both feet   2. Diabetic polyneuropathy associated with type 2 diabetes mellitus ([place])      Plan:  Patient was evaluated and treated and all questions answered. -Discussed and educated patient on diabetic foot care, especially with  regards to the vascular, neurological and musculoskeletal systems.  -Stressed the importance  of good glycemic control and the detriment of not  controlling glucose levels in relation to the foot. -Discussed supportive shoes at all times and checking feet regularly.  -Mechanically debrided all nails 1-5 bilateral using sterile nail nipper and filed with dremel without incident  -Answered all patient questions -Patient to return  in 3 months for at risk foot care -Patient advised to call the office if any problems or questions arise in the meantime.   Lorenda Peck, DPM

## 2021-11-02 ENCOUNTER — Ambulatory Visit: Payer: 59 | Admitting: Podiatry

## 2021-11-07 DIAGNOSIS — M961 Postlaminectomy syndrome, not elsewhere classified: Secondary | ICD-10-CM | POA: Insufficient documentation

## 2021-11-29 DIAGNOSIS — M545 Low back pain, unspecified: Secondary | ICD-10-CM | POA: Insufficient documentation

## 2021-12-01 NOTE — Progress Notes (Signed)
Called patient to make an appt for a follow up with you or Dr. Shellia Carwin

## 2021-12-06 ENCOUNTER — Ambulatory Visit: Payer: 59 | Admitting: Cardiology

## 2021-12-07 ENCOUNTER — Ambulatory Visit: Payer: 59

## 2021-12-07 VITALS — BP 147/79 | HR 66 | Temp 97.1°F | Resp 16 | Ht 69.0 in | Wt 217.0 lb

## 2021-12-07 DIAGNOSIS — I2510 Atherosclerotic heart disease of native coronary artery without angina pectoris: Secondary | ICD-10-CM

## 2021-12-07 DIAGNOSIS — I480 Paroxysmal atrial fibrillation: Secondary | ICD-10-CM

## 2021-12-07 DIAGNOSIS — I739 Peripheral vascular disease, unspecified: Secondary | ICD-10-CM

## 2021-12-07 DIAGNOSIS — I10 Essential (primary) hypertension: Secondary | ICD-10-CM

## 2021-12-07 DIAGNOSIS — I6523 Occlusion and stenosis of bilateral carotid arteries: Secondary | ICD-10-CM

## 2021-12-07 NOTE — Progress Notes (Signed)
Primary Physician/Referring:  [name], MD  Patient ID: Carmen Armstrong, female    DOB: 01-Jul-1965, 56 y.o.   MRN: [id number]  Chief Complaint  Patient presents with   Results   Follow-up   HPI:    Carmen Armstrong  is a 56 y.o. with hypertension, hyperlipidemia, type 2 DM, PAD, depression, smoking 0.5 pack per day for 30 years, 15 pack years. History of ablation for atrial fibrillation in February and April 2021. She had ulcer of her left fifth toe, with worsening gangrene despite revascularization of right and left lower extremities, required amputation.   She presents for 6 month follow up.  Overall, she is doing well. She is still having ongoing issues with insomnia and is seeing her PCP and physiatry for this. During medication trials for insomnia she was started on Gabapentin which did not help with sleep but did improve her neuropathy. Denies symptoms suggestive of CVA or TIA.  Denies chest pain, dyspnea, palpitations.  She continues to tolerate anticoagulation without bleeding diathesis.  Past Medical History:  Diagnosis Date   Arrhythmia    Atrial fibrillation (HCC)    CAD (coronary artery disease)    Chronic lower back pain    Diabetes mellitus without complication (HCC)    Eczema    GAD (generalized anxiety disorder)    H/O vitamin D deficiency    Heart failure (HCC)    Hyperlipidemia    Hypertension    Insomnia    PVD (peripheral vascular disease) ([place])    Past Surgical History:  Procedure Laterality Date   ANGIOPLASTY     legs   BACK SURGERY     5 different times   CARDIAC CATHETERIZATION  2020   REPLACEMENT TOTAL HIP W/  RESURFACING IMPLANTS Right 2018   RIGHT AND LEFT HEART CATH     ROTATOR CUFF REPAIR Right 2016   SPINE SURGERY  2014,2016,2017,2019   toe amputated  09/2018   5th left toe   Family History  Problem Relation Age of Onset   Hypertension Mother    Hyperlipidemia Mother    Diabetes Mother    Stroke Mother    Clotting  disorder Father    Hypertension Father    Hyperlipidemia Father    Hyperlipidemia Sister    Diabetes Brother    Neuropathy Brother     Social History   Tobacco Use   Smoking status: Every Day    Packs/day: 0.50    Years: 30.00    Total pack years: 15.00    Types: Cigarettes   Smokeless tobacco: Never  Substance Use Topics   Alcohol use: Not Currently   Marital Status: Widowed   ROS  Review of Systems  Eyes:  Negative for visual disturbance.  Cardiovascular:  Positive for dyspnea on exertion (stbale). Negative for chest pain, claudication, leg swelling, near-syncope, orthopnea, palpitations, paroxysmal nocturnal dyspnea and syncope.  Hematologic/Lymphatic: Does not bruise/bleed easily.  Musculoskeletal:  Positive for back pain and joint pain.  Gastrointestinal:  Negative for melena.  Neurological:  Positive for paresthesias (Bilateral feet, improving). Negative for dizziness and headaches.    Objective  Blood pressure (!) 147/79, pulse 66, temperature (!) 97.1 F (36.2 C), temperature source Temporal, resp. rate 16, height '5\' 9"'  (1.753 m), weight 217 lb (98.4 kg), SpO2 95 %.     12/07/2021   11:44 AM 09/06/2021   10:52 AM 07/12/2021   10:57 AM  Vitals with BMI  Height '5\' 9"'  '5\' 9"'    Weight 217  lbs 212 lbs 10 oz   BMI 63.33 54.56   Systolic 256 389 373  Diastolic 79 78 80  Pulse 66 80 65     Physical Exam Vitals reviewed.  Constitutional:      Appearance: She is obese.  Neck:     Vascular: Carotid bruit (right > left ) present. No JVD.  Cardiovascular:     Rate and Rhythm: Normal rate and regular rhythm.     Pulses: Intact distal pulses.          Femoral pulses are 2+ on the right side and 2+ on the left side.      Popliteal pulses are 1+ on the right side and 1+ on the left side.       Dorsalis pedis pulses are 0 on the right side and 0 on the left side.       Posterior tibial pulses are 1+ on the right side and 0 on the left side.     Heart sounds: S1 normal  and S2 normal. Murmur heard.     Harsh midsystolic murmur is present at the upper right sternal border radiating to the neck.     No gallop.  Pulmonary:     Effort: Pulmonary effort is normal. No respiratory distress.     Breath sounds: No wheezing, rhonchi or rales.  Musculoskeletal:     Right lower leg: No edema.     Left lower leg: No edema.  Neurological:     Mental Status: She is alert.    Laboratory examination:   No results for input(s): "NA", "K", "CL", "CO2", "GLUCOSE", "BUN", "CREATININE", "CALCIUM", "GFRNONAA", "GFRAA" in the last 8760 hours.  CrCl cannot be calculated (Patient's most recent lab result is older than the maximum 21 days allowed.).     Latest Ref Rng & Units 12/06/2020   11:34 AM 09/07/2020   11:31 AM 12/17/2019   10:24 AM  CMP  Glucose 70 - 99 mg/dL 190  210  132   BUN 6 - 24 mg/dL '9  15  9   ' Creatinine 0.57 - 1.00 mg/dL 0.61  0.83  0.74   Sodium 134 - 144 mmol/L 138  138  138   Potassium 3.5 - 5.2 mmol/L 4.6  4.9  4.6   Chloride 96 - 106 mmol/L 102  106  103   CO2 20 - 29 mmol/L '29  23  28   ' Calcium 8.7 - 10.2 mg/dL 9.4  9.5  9.4   Total Protein 6.0 - 8.3 g/dL  7.5  7.8   Total Bilirubin 0.2 - 1.2 mg/dL  0.4  0.4   Alkaline Phos 39 - 117 U/L  112  174   AST 0 - 37 U/L  12  13   ALT 0 - 35 U/L  11  12       Latest Ref Rng & Units 09/07/2020   11:31 AM 12/17/2019   10:24 AM  CBC  WBC 4.0 - 10.5 K/uL 5.6  6.5   Hemoglobin 12.0 - 15.0 g/dL 12.8  12.8   Hematocrit 36.0 - 46.0 % 40.9  40.6   Platelets 150.0 - 400.0 K/uL 177.0  236.0     Lipid Panel No results for input(s): "CHOL", "TRIG", "LDLCALC", "VLDL", "HDL", "CHOLHDL", "LDLDIRECT" in the last 8760 hours.  HEMOGLOBIN A1C Lab Results  Component Value Date   HGBA1C 8.2 (A) 01/03/2021   TSH Recent Labs    01/03/21 1150 05/04/21 1115 09/06/21 1107  TSH  0.43 6.89* 5.96*   External labs:  01/27/2020: Hemoglobin 12.4, hematocrit 39.9, MCV 82, platelets 222 Total cholesterol 83,  triglycerides 68, HDL 32, LDL 36 Glucose 92, BUN 23, creatinine 1.07, GFR 59, sodium 141, potassium 4.5, alk phos 178, CMP otherwise normal TSH 0.022  Allergies   Allergies  Allergen Reactions   Omeprazole     Other reaction(s): Not available   Other     Hazelnuts- SOB   Penicillin G     whelps   Trulicity [Dulaglutide]     Large itchy bumps    Medications Prior to Visit:   Outpatient Medications Prior to Visit  Medication Sig Dispense Refill   amLODipine (NORVASC) 10 MG tablet Take 1 tablet (10 mg total) by mouth daily. 90 tablet 3   atorvastatin (LIPITOR) 40 MG tablet Take 40 mg by mouth daily.     buPROPion (WELLBUTRIN XL) 150 MG 24 hr tablet Take 150 mg by mouth every morning.     candesartan (ATACAND) 4 MG tablet Take 4 mg by mouth daily.     ELIQUIS 5 MG TABS tablet Take 5 mg by mouth 2 (two) times daily.     folic acid (FOLVITE) 1 MG tablet Take 1 mg by mouth daily.     insulin degludec (TRESIBA FLEXTOUCH) 200 UNIT/ML FlexTouch Pen 40 Units.     LINZESS 290 MCG CAPS capsule Take 290 mcg by mouth daily as needed.     methimazole (TAPAZOLE) 5 MG tablet Take 1 tablet (5 mg total) by mouth daily. 90 tablet 2   metoprolol succinate (TOPROL-XL) 100 MG 24 hr tablet Take 100 mg by mouth daily.     morphine (MS CONTIN) 30 MG 12 hr tablet Take 30 mg by mouth every 8 (eight) hours.     naloxone (NARCAN) nasal spray 4 mg/0.1 mL Place 1 spray into the nose as needed.     omeprazole (PRILOSEC) 20 MG capsule Take 20 mg by mouth daily.     OZEMPIC, 0.25 OR 0.5 MG/DOSE, 2 MG/1.5ML SOPN SMARTSIG:0.25 Milligram(s) SUB-Q Once a Week     sertraline (ZOLOFT) 100 MG tablet Take 100 mg by mouth 2 (two) times daily.     spironolactone (ALDACTONE) 25 MG tablet TAKE 1 TABLET (25 MG TOTAL) BY MOUTH DAILY. 30 tablet 3   vitamin B-12 (CYANOCOBALAMIN) 1000 MCG tablet Take 1,000 mcg by mouth daily.     Vitamin D, Ergocalciferol, (DRISDOL) 1.25 MG (50000 UNIT) CAPS capsule Take 50,000 Units by mouth once  a week.     XIGDUO XR 06-998 MG TB24 Take 1 tablet by mouth 2 (two) times daily.     gabapentin (NEURONTIN) 300 MG capsule Take 300 mg by mouth daily.     baclofen (LIORESAL) 10 MG tablet Take 10 mg by mouth 3 (three) times daily as needed.     No facility-administered medications prior to visit.   Final Medications at End of Visit    Current Meds  Medication Sig   amLODipine (NORVASC) 10 MG tablet Take 1 tablet (10 mg total) by mouth daily.   atorvastatin (LIPITOR) 40 MG tablet Take 40 mg by mouth daily.   buPROPion (WELLBUTRIN XL) 150 MG 24 hr tablet Take 150 mg by mouth every morning.   candesartan (ATACAND) 4 MG tablet Take 4 mg by mouth daily.   ELIQUIS 5 MG TABS tablet Take 5 mg by mouth 2 (two) times daily.   folic acid (FOLVITE) 1 MG tablet Take 1 mg by mouth daily.  insulin degludec (TRESIBA FLEXTOUCH) 200 UNIT/ML FlexTouch Pen 40 Units.   LINZESS 290 MCG CAPS capsule Take 290 mcg by mouth daily as needed.   methimazole (TAPAZOLE) 5 MG tablet Take 1 tablet (5 mg total) by mouth daily.   metoprolol succinate (TOPROL-XL) 100 MG 24 hr tablet Take 100 mg by mouth daily.   morphine (MS CONTIN) 30 MG 12 hr tablet Take 30 mg by mouth every 8 (eight) hours.   naloxone (NARCAN) nasal spray 4 mg/0.1 mL Place 1 spray into the nose as needed.   omeprazole (PRILOSEC) 20 MG capsule Take 20 mg by mouth daily.   OZEMPIC, 0.25 OR 0.5 MG/DOSE, 2 MG/1.5ML SOPN SMARTSIG:0.25 Milligram(s) SUB-Q Once a Week   sertraline (ZOLOFT) 100 MG tablet Take 100 mg by mouth 2 (two) times daily.   spironolactone (ALDACTONE) 25 MG tablet TAKE 1 TABLET (25 MG TOTAL) BY MOUTH DAILY.   vitamin B-12 (CYANOCOBALAMIN) 1000 MCG tablet Take 1,000 mcg by mouth daily.   Vitamin D, Ergocalciferol, (DRISDOL) 1.25 MG (50000 UNIT) CAPS capsule Take 50,000 Units by mouth once a week.   XIGDUO XR 06-998 MG TB24 Take 1 tablet by mouth 2 (two) times daily.   Radiology:   Coronary CTA 12/08/2020: 1. Total coronary calcium  score of 457. This was 99th percentile for age and sex matched control. 2. Normal coronary origin with left dominance. 3. CAD-RADS = 3. Minimal stenosis (0-24%) at the ostial left main due to calcified plaque. Mild stenosis (25-49%) at distal left main/ostial LAD due to calcified plaque. Moderate stenosis (50-69%) at mid LAD due to calcified plaque. Mid to distal LAD is patent. LCX is patent with diffuse luminal irregularities no evidence of significant stenosis. Mild stenosis (25-49%) at proximal RCA due to calcified plaque. Mid to distal RCA is patent. 4.  Aortic Atherosclerosis.  Cardiac Studies:   Lower Extremity Arterial Duplex 10/03/2021:  No hemodynamically significant stenoses are identified in the right lower  extremity arterial system. No hemodynamically significant stenoses are  identified in the left lower extremity arterial system.  This exam reveals normal perfusion of the right lower extremity (ABI  1.04).  Mildly abnormal biphasic waveform right PT and severely abnormal  monophasic waveform AT at the right ankle.  This exam reveals normal perfusion of the left lower extremity (ABI 1.08).  Mildly abnormal biphasic waveform at the left ankle.  Compared to ABI 09/05/2019, no significant change.   Carotid artery duplex 10/03/2021:  Duplex suggests stenosis in the right internal carotid artery (minimal).  Heterogeneous plaque.  Duplex suggests stenosis in the left internal carotid artery (50-69%).  Heterogeneous plaque.  Antegrade right vertebral artery flow. Antegrade left vertebral artery  flow.  No significant change from 01/17/2021. Follow up in six months is  appropriate if clinically indicated.   PCV ECHOCARDIOGRAM COMPLETE 12/08/2020 Hyperdynamic LV systolic function with visual EF >70%. Left ventricle cavity is normal in size. Moderate left ventricular hypertrophy. Normal global wall motion. Normal diastolic filling pattern, normal LAP. Mild (Grade I) mitral  regurgitation. Mild tricuspid regurgitation. No prior study for comparison.  ABI 09/05/2019:  This exam reveals mildly decreased perfusion of the right lower extremity, noted at the post tibial artery level with monophasic waveform(ABI 0.96).    The right anterior tibial artery shows dampened monophasic waveform suggestive of severe diffuse disease or occlusion.  This exam reveals mildly decreased perfusion of the left lower extremity, noted at the anterior tibial and post tibial artery level (ABI 0.96).   EKG:  07/12/2021: Sinus rhythm at a rate of 70 bpm.  Normal axis.  No evidence of underlying ischemia or injury pattern.  Compared EKG 01/11/2021, no significant change.  EKG 03/22/2020: Sinus rhythm at a rate of 82 bpm, left atrial enlargement.  Normal axis.  Cannot exclude anteroseptal infarct old.  Low voltage complexes.  Assessment     ICD-10-CM   1. Coronary artery disease involving native coronary artery of native heart without angina pectoris  I25.10     2. Asymptomatic bilateral carotid artery stenosis  I65.23     3. PVD (peripheral vascular disease) (HCC)  I73.9     4. Paroxysmal atrial fibrillation (HCC)  I48.0     5. Primary hypertension  I10        Medications Discontinued During This Encounter  Medication Reason   baclofen (LIORESAL) 10 MG tablet      No orders of the defined types were placed in this encounter.   Recommendations:   Carmen Armstrong is a 56 y.o.  with hypertension, hyperlipidemia, type 2 DM, PAD, depression. History of ablation for atrial fibrillation in February and April 2021. She had ulcer of her left fifth toe, with worsening gangrene despite revascularization of right and left lower extremities, required amputation.  CAD:  Nonobstructive CAD per coronary CTA 11/2020 with calcium score 457 placing patient in the 99th percentile for age and sex Continue metoprolol, atorvastatin, candesartan.  Patient is presently not on aspirin given  ongoing use of Eliquis in the setting of paroxysmal atrial fibrillation.  PAD: Recent ABI on 10/03/2021 reveals normal perfusion of the right lower extremity (ABI 1.04) Continue statin therapy Patient presently on Eliquis given history of paroxysmal atrial fibrillation, not on aspirin at this time. Patient unable to tolerate Plavix due to rash    Paroxysmal A. Fib: History of prior ablations.   Maintaining sinus rhythm. CHA2DS2VASc score 4, annual stroke risk 5%. Tolerating anticoagulation without bleeding diathesis  Continue Eliquis 5 mg twice daily   Hypertension: Blood pressure is elevated at today's visit. She does monitor her blood pressure at home and readings are 120s/70s.  Continue candesartan, metoprolol succinate, spironolactone, amlodipine without changes at this time. Advised her to continue to monitor blood pressure and keep a log of readings.  Bilateral carotid artery stenosis:  Reviewed recent carotid artery duplex, will continue surveillance and repetat in 6 months. Continue statin therapy.  She does report recent lab work approximately 3-4 weeks ago at PCP, will request records.  Follow-up in 6 months, sooner if needed.   Ernst Spell, AGNP-C 12/07/2021, 1:10 PM Office: [phone] Pager: [phone]

## 2022-02-20 ENCOUNTER — Ambulatory Visit: Payer: 59 | Admitting: Podiatry

## 2022-02-20 ENCOUNTER — Encounter: Payer: Self-pay | Admitting: Podiatry

## 2022-02-20 VITALS — BP 138/82

## 2022-02-20 DIAGNOSIS — D689 Coagulation defect, unspecified: Secondary | ICD-10-CM

## 2022-02-20 DIAGNOSIS — M79675 Pain in left toe(s): Secondary | ICD-10-CM | POA: Diagnosis not present

## 2022-02-20 DIAGNOSIS — E1142 Type 2 diabetes mellitus with diabetic polyneuropathy: Secondary | ICD-10-CM

## 2022-02-20 DIAGNOSIS — B351 Tinea unguium: Secondary | ICD-10-CM | POA: Diagnosis not present

## 2022-02-20 DIAGNOSIS — M79674 Pain in right toe(s): Secondary | ICD-10-CM

## 2022-02-20 NOTE — Progress Notes (Signed)
  Subjective:  Patient ID: Carmen Armstrong, female    DOB: 11-05-1965,   MRN: [id number]  Chief Complaint  Patient presents with   Nail Problem    Nail trim , pt states she has been feeling tightness in her feet     57 y.o. female presents for concern of thickened elongated and painful nails that are difficult to trim. Requesting to have them trimmed today. Relates burning and tingling in their feet. Patient is diabetic and last A1c was  Lab Results  Component Value Date   HGBA1C 8.2 (A) 01/03/2021   .   PCP:  [name], MD    . Denies any other pedal complaints. Denies n/v/f/c.   Past Medical History:  Diagnosis Date   Arrhythmia    Atrial fibrillation ([place])    CAD (coronary artery disease)    Chronic lower back pain    Diabetes mellitus without complication (HCC)    Eczema    GAD (generalized anxiety disorder)    H/O vitamin D deficiency    Heart failure (HCC)    Hyperlipidemia    Hypertension    Insomnia    PVD (peripheral vascular disease) (HCC)     Objective:  Physical Exam: Vascular: DP/PT pulses 2/4 bilateral. CFT <3 seconds. Absent hair growth on digits. Edema noted to bilateral lower extremities. Xerosis noted bilaterally.  Skin. No lacerations or abrasions bilateral feet. Nails 1-5 bilateral  are thickened discolored and elongated with subungual debris.  Musculoskeletal: MMT 5/5 bilateral lower extremities in DF, PF, Inversion and Eversion. Deceased ROM in DF of ankle joint. 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  Neurological: Sensation intact to light touch. Protective sensation diminished bilateral.    Assessment:   1. Pain due to onychomycosis of toenails of both feet   2. Diabetic polyneuropathy associated with type 2 diabetes mellitus ([place])   3. Coagulation defect ([place])       Plan:  Patient was evaluated and treated and all questions answered. -Discussed and educated patient on diabetic foot care, especially with   regards to the vascular, neurological and musculoskeletal systems.  -Stressed the importance of good glycemic control and the detriment of not  controlling glucose levels in relation to the foot. -Discussed supportive shoes at all times and checking feet regularly.  -Mechanically debrided all nails 1-5 bilateral using sterile nail nipper and filed with dremel without incident  -Answered all patient questions -Patient to return  in 3 months for at risk foot care -Patient advised to call the office if any problems or questions arise in the meantime.   Lorenda Peck, DPM

## 2022-02-28 ENCOUNTER — Other Ambulatory Visit: Payer: Self-pay | Admitting: Family Medicine

## 2022-02-28 DIAGNOSIS — Z1231 Encounter for screening mammogram for malignant neoplasm of breast: Secondary | ICD-10-CM

## 2022-03-09 ENCOUNTER — Encounter: Payer: Self-pay | Admitting: Internal Medicine

## 2022-03-09 ENCOUNTER — Ambulatory Visit: Payer: 59 | Admitting: Internal Medicine

## 2022-03-09 VITALS — BP 120/70 | HR 67 | Ht 69.0 in | Wt 222.0 lb

## 2022-03-09 DIAGNOSIS — E0500 Thyrotoxicosis with diffuse goiter without thyrotoxic crisis or storm: Secondary | ICD-10-CM | POA: Diagnosis not present

## 2022-03-09 DIAGNOSIS — E0590 Thyrotoxicosis, unspecified without thyrotoxic crisis or storm: Secondary | ICD-10-CM

## 2022-03-09 LAB — COMPREHENSIVE METABOLIC PANEL
ALT: 12 U/L (ref 0–35)
AST: 12 U/L (ref 0–37)
Albumin: 4.1 g/dL (ref 3.5–5.2)
Alkaline Phosphatase: 83 U/L (ref 39–117)
BUN: 13 mg/dL (ref 6–23)
CO2: 26 mEq/L (ref 19–32)
Calcium: 9.6 mg/dL (ref 8.4–10.5)
Chloride: 103 mEq/L (ref 96–112)
Creatinine, Ser: 0.82 mg/dL (ref 0.40–1.20)
GFR: 79.44 mL/min (ref >60.00)
Glucose, Bld: 189 mg/dL — ABNORMAL HIGH (ref 70–99)
Potassium: 4.4 mEq/L (ref 3.5–5.1)
Sodium: 138 mEq/L (ref 135–145)
Total Bilirubin: 0.3 mg/dL (ref 0.2–1.2)
Total Protein: 7.2 g/dL (ref 6.0–8.3)

## 2022-03-09 LAB — CBC
HCT: 37.6 % (ref 36.0–46.0)
Hemoglobin: 12.1 g/dL (ref 12.0–15.0)
MCHC: 32.1 g/dL (ref 30.0–36.0)
MCV: 84.7 fl (ref 78.0–100.0)
Platelets: 233 10*3/uL (ref 150.0–400.0)
RBC: 4.45 Mil/uL (ref 3.87–5.11)
RDW: 15 % (ref 11.5–15.5)
WBC: 6.6 10*3/uL (ref 4.0–10.5)

## 2022-03-09 LAB — T4, FREE: Free T4: 1.06 ng/dL (ref 0.60–1.60)

## 2022-03-09 LAB — TSH: TSH: 1.4 u[IU]/mL (ref 0.35–5.50)

## 2022-03-09 NOTE — Progress Notes (Signed)
Name: Arcenia Scarbro  MRN/ DOB: [id number], 09-20-1965    Age/ Sex: 57 y.o., female     PCP: [name], MD   Reason for Endocrinology Evaluation: Hyperthyroidism     Initial Endocrinology Clinic Visit: 11/05/2019    PATIENT IDENTIFIER: Ms. Kelsi Benham is a 57 y.o., female with a past medical history of HTN, T2DM, PAF , CHF and dyslipidemia. She has followed with [facility] since 11/05/2019 for consultative assistance with management of her Hyperthyroidism.      HISTORICAL SUMMARY:   She was diagnosed with hyperthyroidism in 10/2018 during routine workup . Two months prior to her presentation she was noted with dry eyes. Had occasional double vision, and is under the care of an ophthalmologist.     Pt on Multaq  Since 2020. S/P cardiac ablation  . Has not been on amiodarone since 2021  Thyroid ultrasound did not reveal any thyroid nodules 12/2020 TRAb elevated 30.55 (2021)  Methimazole started 10/2019   Paternal grandmother with thyroid disease  SUBJECTIVE:     Today (03/10/2022):  Ms. Desantiago is here for hyperthyroidism.   She had a follow-up with cardiology for CAD, peripheral artery disease, and A-fib on 12/07/2021   Weight has been increasing  Denies palpitations Has occasional constipation- on linzess every other day  Denies local neck symptoms  Denies tremors  Sees an eye specialist at  [facility] , has an appointment in 2 weeks, has double vision as well as eye discomfort for the past 6 weeks  She follows with pain management ans noted right sided back pain that radiates to right upper abdomen and wraps around to the other side again  Methimazole 5 mg , 1 tabs daily    HISTORY:  Past Medical History:  Past Medical History:  Diagnosis Date   Arrhythmia    Atrial fibrillation ([place])    CAD (coronary artery disease)    Chronic lower back pain    Diabetes mellitus without complication ([place])    Eczema     GAD (generalized anxiety disorder)    H/O vitamin D deficiency    Heart failure ([place])    Hyperlipidemia    Hypertension    Insomnia    PVD (peripheral vascular disease) ([place])    Past Surgical History:  Past Surgical History:  Procedure Laterality Date   ANGIOPLASTY     legs   BACK SURGERY     5 different times   CARDIAC CATHETERIZATION  2020   REPLACEMENT TOTAL HIP W/  RESURFACING IMPLANTS Right 2018   RIGHT AND LEFT HEART CATH     ROTATOR CUFF REPAIR Right 2016   SPINE SURGERY  2014,2016,2017,2019   toe amputated  09/2018   5th left toe   Social History:  reports that she has been smoking cigarettes. She has a 15.00 pack-year smoking history. She has never used smokeless tobacco. She reports that she does not currently use alcohol. She reports that she does not currently use drugs. Family History:  Family History  Problem Relation Age of Onset   Hypertension Mother    Hyperlipidemia Mother    Diabetes Mother    Stroke Mother    Clotting disorder Father    Hypertension Father    Hyperlipidemia Father    Hyperlipidemia Sister    Diabetes Brother    Neuropathy Brother      HOME MEDICATIONS: Allergies as of 03/09/2022       Reactions   Corylus    Omeprazole  Other reaction(s): Not available   Other    Hazelnuts- SOB   Pear    Penicillin G    whelps   Trulicity [dulaglutide]    Large itchy bumps        Medication List        Accurate as of March 09, 2022 11:59 PM. If you have any questions, ask your nurse or doctor.          STOP taking these medications    Vitamin D (Ergocalciferol) 1.25 MG (50000 UNIT) Caps capsule Commonly known as: DRISDOL Stopped by: Dorita Sciara, MD       TAKE these medications    ALPRAZolam 1 MG 24 hr tablet Commonly known as: XANAX XR Take 1 mg by mouth at bedtime.   amLODipine 10 MG tablet Commonly known as: NORVASC Take 1 tablet (10 mg total) by mouth daily.   atorvastatin 40 MG tablet Commonly  known as: LIPITOR Take 40 mg by mouth daily.   buPROPion 150 MG 24 hr tablet Commonly known as: WELLBUTRIN XL Take 150 mg by mouth every morning.   candesartan 4 MG tablet Commonly known as: ATACAND Take 4 mg by mouth daily.   cyanocobalamin 1000 MCG tablet Commonly known as: VITAMIN B12 Take 1,000 mcg by mouth daily.   Eliquis 5 MG Tabs tablet Generic drug: apixaban Take 5 mg by mouth 2 (two) times daily.   folic acid 1 MG tablet Commonly known as: FOLVITE Take 1 mg by mouth daily.   FreeStyle Libre 2 Sensor Misc   gabapentin 300 MG capsule Commonly known as: NEURONTIN Take 300 mg by mouth daily.   Linzess 290 MCG Caps capsule Generic drug: linaclotide Take 290 mcg by mouth daily as needed.   methimazole 5 MG tablet Commonly known as: TAPAZOLE Take 1 tablet (5 mg total) by mouth daily.   metoprolol succinate 100 MG 24 hr tablet Commonly known as: TOPROL-XL Take 100 mg by mouth daily.   morphine 30 MG 12 hr tablet Commonly known as: MS CONTIN Take 30 mg by mouth every 8 (eight) hours.   naloxone 4 MG/0.1ML Liqd nasal spray kit Commonly known as: NARCAN Place 1 spray into the nose as needed.   omeprazole 20 MG capsule Commonly known as: PRILOSEC Take 20 mg by mouth daily.   Ozempic (0.25 or 0.5 MG/DOSE) 2 MG/1.5ML Sopn Generic drug: Semaglutide(0.25 or 0.5MG /DOS) SMARTSIG:0.25 Milligram(s) SUB-Q Once a Week   sertraline 100 MG tablet Commonly known as: ZOLOFT Take 100 mg by mouth 2 (two) times daily.   spironolactone 25 MG tablet Commonly known as: ALDACTONE TAKE 1 TABLET (25 MG TOTAL) BY MOUTH DAILY.   Tyler Aas FlexTouch 200 UNIT/ML FlexTouch Pen Generic drug: insulin degludec 50 Units.   Xigduo XR 06-998 MG Tb24 Generic drug: Dapagliflozin Pro-metFORMIN ER Take 1 tablet by mouth 2 (two) times daily.          OBJECTIVE:   PHYSICAL EXAM: VS: BP 120/70 (BP Location: Left Arm, Patient Position: Sitting, Cuff Size: Large)   Pulse 67   Ht  5\' 9"  (1.753 m)   Wt 222 lb (100.7 kg)   SpO2 96%   BMI 32.78 kg/m     EXAM: General: Pt appears well and is in NAD  Eyes: External eye exam normal without stare, lid lag or exophthalmos.    Neck: General: Supple without adenopathy. Thyroid: no nodules appreciated   Lungs: Clear with good BS bilat  Heart: Auscultation: RRR.  Extremities:  BL LE: no  pretibial edema   Mental Status: Judgment, insight: Intact Mood and affect: No depression, anxiety, or agitation     DATA REVIEWED:   Latest Reference Range & Units 03/09/22 11:36  Sodium 135 - 145 mEq/L 138  Potassium 3.5 - 5.1 mEq/L 4.4  Chloride 96 - 112 mEq/L 103  CO2 19 - 32 mEq/L 26  Glucose 70 - 99 mg/dL 161 (H)  BUN 6 - 23 mg/dL 13  Creatinine 0.96 - 0.45 mg/dL 4.09  Calcium 8.4 - 81.1 mg/dL 9.6  Alkaline Phosphatase 39 - 117 U/L 83  Albumin 3.5 - 5.2 g/dL 4.1  AST 0 - 37 U/L 12  ALT 0 - 35 U/L 12  Total Protein 6.0 - 8.3 g/dL 7.2  Total Bilirubin 0.2 - 1.2 mg/dL 0.3  GFR >91.47 mL/min 79.44  WBC 4.0 - 10.5 K/uL 6.6  RBC 3.87 - 5.11 Mil/uL 4.45  Hemoglobin 12.0 - 15.0 g/dL 82.9  HCT 56.2 - 13.0 % 37.6  MCV 78.0 - 100.0 fl 84.7  MCHC 30.0 - 36.0 g/dL 86.5  RDW 78.4 - 69.6 % 15.0  Platelets 150.0 - 400.0 K/uL 233.0    Latest Reference Range & Units 03/09/22 11:36  Glucose 70 - 99 mg/dL 295 (H)  TSH 2.84 - 1.32 uIU/mL 1.40  T4,Free(Direct) 0.60 - 1.60 ng/dL 4.40      Results for KALINA, MORABITO (MRN [id number]) as of 01/26/2020 13:34  Ref. Range 12/17/2019 10:24  TRAB Latest Ref Range: <=2.00 IU/L 30.55 (H)    Thyroid Ultrasound 01/12/2021  Estimated total number of nodules >/= 1 cm: 0   Number of spongiform nodules >/=  2 cm not described below (TR1): 0   Number of mixed cystic and solid nodules >/= 1.5 cm not described below (TR2): 0   _________________________________________________________   No discrete nodules are seen within the thyroid gland.   IMPRESSION: 1. Borderline enlarged  thyroid gland without discrete nodule identified.    ASSESSMENT / PLAN / RECOMMENDATIONS:   Hyperthyroidism Secondary to Graves' Disease:    - Pt is clinically euthyroid  - No local neck symptoms - Tolerating methimazole without side effects  -TFTs are normal, no changes  Medications   Continue methimazole 5 mg daily   2. Graves' Disease:   -Patient with recent visual changes including diplopia, she has an appointment with her ophthalmologist in the next 2 weeks    F/U in 6 months     Signed electronically by: Lyndle Herrlich, MD  [facility] End[facility]  [facility] [address] [place] [place], [place] [address] Phone: [phone] FAX: [phone]      CC: Lewis Moccasin, MD [address] [place] [address] Phone: [phone]  Fax: [phone]   Return to Endocrinology clinic as below: Future Appointments  Date Time Provider Department Center  05/22/2022 10:15 AM Louann Sjogren, DPM [facility]  06/08/2022  1:00 PM Tessa Lerner, DO [facility] None  09/11/2022 11:10 AM Ronald Vinsant, Konrad Dolores, MD [facility] None

## 2022-03-10 MED ORDER — METHIMAZOLE 5 MG PO TABS: 5.0000 mg | ORAL_TABLET | Freq: Every day | ORAL | 3 refills | Status: DC

## 2022-03-24 DIAGNOSIS — M546 Pain in thoracic spine: Secondary | ICD-10-CM | POA: Insufficient documentation

## 2022-04-18 DIAGNOSIS — T819XXA Unspecified complication of procedure, initial encounter: Secondary | ICD-10-CM | POA: Insufficient documentation

## 2022-04-19 ENCOUNTER — Ambulatory Visit (HOSPITAL_COMMUNITY): Payer: Self-pay | Admitting: Orthopedic Surgery

## 2022-04-24 NOTE — Progress Notes (Signed)
Surgical Instructions    Your procedure is scheduled on Monday, 05/01/22.  Report to [facility] Main Entrance "A" at 5:30 A.M., then check in with the Admitting office.  Call this number if you have problems the morning of surgery:  [phone]   If you have any questions prior to your surgery date call [phone]: Open Monday-Friday 8am-4pm If you experience any cold or flu symptoms such as cough, fever, chills, shortness of breath, etc. between now and your scheduled surgery, please notify us at the above number     Remember:  Do not eat after midnight the night before your surgery  You may drink clear liquids until 4:30am the morning of your surgery.   Clear liquids allowed are: Water, Non-Citrus Juices (without pulp), Carbonated Beverages, Clear Tea, Black Coffee ONLY (NO MILK, CREAM OR POWDERED CREAMER of any kind), and Gatorade    Take these medicines the morning of surgery with A SIP OF WATER:  amLODipine (NORVASC)  atorvastatin (LIPITOR)  buPROPion (WELLBUTRIN XL)  gabapentin (NEURONTIN)  methimazole (TAPAZOLE)  metoprolol succinate (TOPROL-XL) morphine (MS CONTIN)  omeprazole (PRILOSEC)  sertraline (ZOLOFT)    As of today, STOP taking any Aspirin (unless otherwise instructed by your surgeon) Aleve, Naproxen, Ibuprofen, Motrin, Advil, Goody's, BC's, all herbal medications, fish oil, and all vitamins.  Please follow your cardiologist/surgeons instructions in regards to stopping ELIQUIS. If no instructions were given to you, please reach out to their office.   WHAT DO I DO ABOUT MY DIABETES MEDICATION?   Do not take oral diabetes medicines (pills) the morning of surgery.  Do not take [place] 7 days prior to surgery. Do not take after 04/23/22.  Do not take XIGDUO 72 hours prior to surgery. Your last dose will be 04/27/22.  THE NIGHT BEFORE SURGERY, take 25 units (50%) of insulin degludec (TRESIBA FLEXTOUCH).      THE MORNING OF SURGERY, take 25 units (50%) of  insulin degludec (TRESIBA FLEXTOUCH).  The day of surgery, do not take other diabetes injectables, including Byetta (exenatide), Bydureon (exenatide ER), Victoza (liraglutide), or Trulicity (dulaglutide).  If your CBG is greater than 220 mg/dL, you may take  of your sliding scale (correction) dose of insulin.   HOW TO MANAGE YOUR DIABETES BEFORE AND AFTER SURGERY  Why is it important to control my blood sugar before and after surgery? Improving blood sugar levels before and after surgery helps healing and can limit problems. A way of improving blood sugar control is eating a healthy diet by:  Eating less sugar and carbohydrates  Increasing activity/exercise  Talking with your doctor about reaching your blood sugar goals High blood sugars (greater than 180 mg/dL) can raise your risk of infections and slow your recovery, so you will need to focus on controlling your diabetes during the weeks before surgery. Make sure that the doctor who takes care of your diabetes knows about your planned surgery including the date and location.  How do I manage my blood sugar before surgery? Check your blood sugar at least 4 times a day, starting 2 days before surgery, to make sure that the level is not too high or low.  Check your blood sugar the morning of your surgery when you wake up and every 2 hours until you get to the Short Stay unit.  If your blood sugar is less than 70 mg/dL, you will need to treat for low blood sugar: Do not take insulin. Treat a low blood sugar (less than 70 mg/dL) with  cup of clear juice (cranberry or apple), 4 glucose tablets, OR glucose gel. Recheck blood sugar in 15 minutes after treatment (to make sure it is greater than 70 mg/dL). If your blood sugar is not greater than 70 mg/dL on recheck, call [phone] for further instructions. Report your blood sugar to the short stay nurse when you get to Short Stay.  If you are admitted to the hospital after surgery: Your  blood sugar will be checked by the staff and you will probably be given insulin after surgery (instead of oral diabetes medicines) to make sure you have good blood sugar levels. The goal for blood sugar control after surgery is 80-180 mg/dL.            Do not wear jewelry or makeup. Do not wear lotions, powders, perfumes or deodorant. Do not shave 48 hours prior to surgery.   Do not bring valuables to the hospital. Do not wear nail polish, gel polish, artificial nails, or any other type of covering on natural nails (fingers and toes) If you have artificial nails or gel coating that need to be removed by a nail salon, please have this removed prior to surgery. Artificial nails or gel coating may interfere with anesthesia's ability to adequately monitor your vital signs.  [facility] is not responsible for any belongings or valuables.    Do NOT Smoke (Tobacco/Vaping)  24 hours prior to your procedure  If you use a CPAP at night, you may bring your mask for your overnight stay.   Contacts, glasses, hearing aids, dentures or partials may not be worn into surgery, please bring cases for these belongings   For patients admitted to the hospital, discharge time will be determined by your treatment team.   Patients discharged the day of surgery will not be allowed to drive home, and someone needs to stay with them for 24 hours.   SURGICAL WAITING ROOM VISITATION Patients having surgery or a procedure may have no more than 2 support people in the waiting area - these visitors may rotate.   Children under the age of 76 must have an adult with them who is not the patient. If the patient needs to stay at the hospital during part of their recovery, the visitor guidelines for inpatient rooms apply. Pre-op nurse will coordinate an appropriate time for 1 support person to accompany patient in pre-op.  This support person may not rotate.   Please refer to  [email or web address] for the visitor guidelines for Inpatients (after your surgery is over and you are in a regular room).    Special instructions:    Oral Hygiene is also important to reduce your risk of infection.  Remember - BRUSH YOUR TEETH THE MORNING OF SURGERY WITH YOUR REGULAR TOOTHPASTE   [facility]- Preparing For Surgery  Before surgery, you can play an important role. Because skin is not sterile, your skin needs to be as free of germs as possible. You can reduce the number of germs on your skin by washing with CHG (chlorahexidine gluconate) Soap before surgery.  CHG is an antiseptic cleaner which kills germs and bonds with the skin to continue killing germs even after washing.     Please do not use if you have an allergy to CHG or antibacterial soaps. If your skin becomes reddened/irritated stop using the CHG.  Do not shave (including legs and underarms) for at least 48 hours prior to first CHG shower. It is OK to shave your face.  Please  follow these instructions carefully.     Shower the NIGHT BEFORE SURGERY and the MORNING OF SURGERY with CHG Soap.   If you chose to wash your hair, wash your hair first as usual with your normal shampoo. After you shampoo, rinse your hair and body thoroughly to remove the shampoo.  Then [employer] and genitals (private parts) with your normal soap and rinse thoroughly to remove soap.  After that Use CHG Soap as you would any other liquid soap. You can apply CHG directly to the skin and wash gently with a scrungie or a clean washcloth.   Apply the CHG Soap to your body ONLY FROM THE NECK DOWN.  Do not use on open wounds or open sores. Avoid contact with your eyes, ears, mouth and genitals (private parts). Wash Face and genitals (private parts)  with your normal soap.   Wash thoroughly, paying special attention to the area where your surgery will be performed.  Thoroughly rinse your body with  warm water from the neck down.  DO NOT shower/wash with your normal soap after using and rinsing off the CHG Soap.  Pat yourself dry with a CLEAN TOWEL.  Wear CLEAN PAJAMAS to bed the night before surgery  Place CLEAN SHEETS on your bed the night before your surgery  DO NOT SLEEP WITH PETS.   Day of Surgery: Take a shower with CHG soap. Wear Clean/Comfortable clothing the morning of surgery Do not apply any deodorants/lotions.   Remember to brush your teeth WITH YOUR REGULAR TOOTHPASTE.    If you received a COVID test during your pre-op visit, it is requested that you wear a mask when out in public, stay away from anyone that may not be feeling well, and notify your surgeon if you develop symptoms. If you have been in contact with anyone that has tested positive in the last 10 days, please notify your surgeon.    Please read over the following fact sheets that you were given.

## 2022-04-25 ENCOUNTER — Other Ambulatory Visit: Payer: Self-pay

## 2022-04-25 ENCOUNTER — Encounter (HOSPITAL_COMMUNITY): Payer: Self-pay

## 2022-04-25 ENCOUNTER — Encounter (HOSPITAL_COMMUNITY)
Admission: RE | Admit: 2022-04-25 | Discharge: 2022-04-25 | Disposition: A | Discharge status: Home / Self Care | Payer: 59 | Source: Ambulatory Visit | Attending: Orthopedic Surgery | Admitting: Orthopedic Surgery

## 2022-04-25 VITALS — BP 146/73 | HR 74 | Temp 98.3°F | Resp 18 | Ht 71.0 in | Wt 215.6 lb

## 2022-04-25 DIAGNOSIS — Z794 Long term (current) use of insulin: Secondary | ICD-10-CM | POA: Insufficient documentation

## 2022-04-25 DIAGNOSIS — E119 Type 2 diabetes mellitus without complications: Secondary | ICD-10-CM | POA: Insufficient documentation

## 2022-04-25 DIAGNOSIS — I2510 Atherosclerotic heart disease of native coronary artery without angina pectoris: Secondary | ICD-10-CM | POA: Diagnosis not present

## 2022-04-25 DIAGNOSIS — I10 Essential (primary) hypertension: Secondary | ICD-10-CM | POA: Diagnosis not present

## 2022-04-25 DIAGNOSIS — Z7901 Long term (current) use of anticoagulants: Secondary | ICD-10-CM | POA: Diagnosis not present

## 2022-04-25 DIAGNOSIS — I480 Paroxysmal atrial fibrillation: Secondary | ICD-10-CM | POA: Diagnosis not present

## 2022-04-25 DIAGNOSIS — Z01818 Encounter for other preprocedural examination: Secondary | ICD-10-CM

## 2022-04-25 DIAGNOSIS — E0590 Thyrotoxicosis, unspecified without thyrotoxic crisis or storm: Secondary | ICD-10-CM | POA: Diagnosis not present

## 2022-04-25 DIAGNOSIS — Z01812 Encounter for preprocedural laboratory examination: Secondary | ICD-10-CM | POA: Insufficient documentation

## 2022-04-25 DIAGNOSIS — E785 Hyperlipidemia, unspecified: Secondary | ICD-10-CM | POA: Insufficient documentation

## 2022-04-25 HISTORY — DX: Hypothyroidism, unspecified: E03.9

## 2022-04-25 HISTORY — DX: Depression, unspecified: F32.A

## 2022-04-25 HISTORY — DX: Gastro-esophageal reflux disease without esophagitis: K21.9

## 2022-04-25 HISTORY — DX: Unspecified osteoarthritis, unspecified site: M19.90

## 2022-04-25 HISTORY — DX: Polyneuropathy, unspecified: G62.9

## 2022-04-25 LAB — BASIC METABOLIC PANEL
Anion gap: 11 (ref 5–15)
BUN: 12 mg/dL (ref 6–20)
CO2: 26 mmol/L (ref 22–32)
Calcium: 10.3 mg/dL (ref 8.9–10.3)
Chloride: 101 mmol/L (ref 98–111)
Creatinine, Ser: 0.89 mg/dL (ref 0.44–1.00)
GFR, Estimated: >60 mL/min (ref >60)
Glucose, Bld: 149 mg/dL — ABNORMAL HIGH (ref 70–99)
Potassium: 4.8 mmol/L (ref 3.5–5.1)
Sodium: 138 mmol/L (ref 135–145)

## 2022-04-25 LAB — CBC
HCT: 44.9 % (ref 36.0–46.0)
Hemoglobin: 14 g/dL (ref 12.0–15.0)
MCH: 26.7 pg (ref 26.0–34.0)
MCHC: 31.2 g/dL (ref 30.0–36.0)
MCV: 85.7 fL (ref 80.0–100.0)
Platelets: 257 10*3/uL (ref 150–400)
RBC: 5.24 MIL/uL — ABNORMAL HIGH (ref 3.87–5.11)
RDW: 14.5 % (ref 11.5–15.5)
WBC: 8.7 10*3/uL (ref 4.0–10.5)
nRBC: 0 % (ref 0.0–0.2)

## 2022-04-25 NOTE — Progress Notes (Signed)
PCP - Rachell Cipro, MD Cardiologist - Lawerance Cruel, PA-C  PPM/ICD - Denies  Chest x-ray - Denies EKG - 07/12/2021 Stress Test - Denies ECHO - 12/08/2020 Cardiac Cath - 2020 in [place]  Sleep Study - Denies  DM: Type II Fasting Blood Sugar - 110-110 Checks Blood Sugar Multiple times a day. Patient has freestyle Libra  Last dose of GLP1 agonist-  04/23/2022 GLP1 instructions: Hold Ozempic 7 days prior to Surgery. Last dose on 04/23/2022  Blood Thinner Instructions: Patient taking Eliquis 5 mg twice a day. Patient instructed to contact cardiologist to obtain instructions on when to stop taking Eliquis prior to surgery. Aspirin Instructions:N/A  ERAS Protcol - Yes PRE-SURGERY Ensure or G2- No drink  COVID TEST- N/A   Anesthesia review: Yes, cardiac history.  Patient denies shortness of breath, fever, cough and chest pain at PAT appointment   All instructions explained to the patient, with a verbal understanding of the material. Patient agrees to go over the instructions while at home for a better understanding. The opportunity to ask questions was provided.

## 2022-04-25 NOTE — Progress Notes (Signed)
Surgical Instructions    Your procedure is scheduled on Monday, 05/01/22.  Report to [facility] Main Entrance "A" at 5:30 A.M., then check in with the Admitting office.  Call this number if you have problems the morning of surgery:  [phone]   If you have any questions prior to your surgery date call [phone]: Open Monday-Friday 8am-4pm If you experience any cold or flu symptoms such as cough, fever, chills, shortness of breath, etc. between now and your scheduled surgery, please notify us at the above number     Remember:  Do not eat after midnight the night before your surgery  You may drink clear liquids until 4:30am the morning of your surgery.   Clear liquids allowed are: Water, Non-Citrus Juices (without pulp), Carbonated Beverages, Clear Tea, Black Coffee ONLY (NO MILK, CREAM OR POWDERED CREAMER of any kind), and Gatorade    Take these medicines the morning of surgery with A SIP OF WATER:  amLODipine (NORVASC)  atorvastatin (LIPITOR)  buPROPion (WELLBUTRIN XL)  gabapentin (NEURONTIN)  methimazole (TAPAZOLE)  metoprolol succinate (TOPROL-XL) morphine (MS CONTIN)  omeprazole (PRILOSEC)  sertraline (ZOLOFT)  oxyCODONE-acetaminophen (PERCOCET/ROXICET)   As of today, STOP taking any Aspirin (unless otherwise instructed by your surgeon) Aleve, Naproxen, Ibuprofen, Motrin, Advil, Goody's, BC's, all herbal medications, fish oil, and all vitamins.  Please follow your cardiologist/surgeons instructions in regards to stopping ELIQUIS. If no instructions were given to you, please reach out to their office.   WHAT DO I DO ABOUT MY DIABETES MEDICATION?   Do not take oral diabetes medicines (pills) the morning of surgery.  Do not take [place] 7 days prior to surgery. Do not take after 04/23/22.  Do not take XIGDUO 72 hours prior to surgery. Your last dose will be 04/27/22.  THE NIGHT BEFORE SURGERY, take 25 units (50%) of insulin degludec (TRESIBA FLEXTOUCH).      THE  MORNING OF SURGERY, take 25 units (50%) of insulin degludec (TRESIBA FLEXTOUCH).  The day of surgery, do not take other diabetes injectables, including Byetta (exenatide), Bydureon (exenatide ER), Victoza (liraglutide), or Trulicity (dulaglutide).  If your CBG is greater than 220 mg/dL, you may take  of your sliding scale (correction) dose of insulin.   HOW TO MANAGE YOUR DIABETES BEFORE AND AFTER SURGERY  Why is it important to control my blood sugar before and after surgery? Improving blood sugar levels before and after surgery helps healing and can limit problems. A way of improving blood sugar control is eating a healthy diet by:  Eating less sugar and carbohydrates  Increasing activity/exercise  Talking with your doctor about reaching your blood sugar goals High blood sugars (greater than 180 mg/dL) can raise your risk of infections and slow your recovery, so you will need to focus on controlling your diabetes during the weeks before surgery. Make sure that the doctor who takes care of your diabetes knows about your planned surgery including the date and location.  How do I manage my blood sugar before surgery? Check your blood sugar at least 4 times a day, starting 2 days before surgery, to make sure that the level is not too high or low.  Check your blood sugar the morning of your surgery when you wake up and every 2 hours until you get to the Short Stay unit.  If your blood sugar is less than 70 mg/dL, you will need to treat for low blood sugar: Do not take insulin. Treat a low blood sugar (less than 70 mg/dL)  with  cup of clear juice (cranberry or apple), 4 glucose tablets, OR glucose gel. Recheck blood sugar in 15 minutes after treatment (to make sure it is greater than 70 mg/dL). If your blood sugar is not greater than 70 mg/dL on recheck, call [phone] for further instructions. Report your blood sugar to the short stay nurse when you get to Short Stay.  If you are  admitted to the hospital after surgery: Your blood sugar will be checked by the staff and you will probably be given insulin after surgery (instead of oral diabetes medicines) to make sure you have good blood sugar levels. The goal for blood sugar control after surgery is 80-180 mg/dL.            Do not wear jewelry or makeup. Do not wear lotions, powders, perfumes or deodorant. Do not shave 48 hours prior to surgery.   Do not bring valuables to the hospital. Do not wear nail polish, gel polish, artificial nails, or any other type of covering on natural nails (fingers and toes) If you have artificial nails or gel coating that need to be removed by a nail salon, please have this removed prior to surgery. Artificial nails or gel coating may interfere with anesthesia's ability to adequately monitor your vital signs.  [facility] is not responsible for any belongings or valuables.    Do NOT Smoke (Tobacco/Vaping)  24 hours prior to your procedure  If you use a CPAP at night, you may bring your mask for your overnight stay.   Contacts, glasses, hearing aids, dentures or partials may not be worn into surgery, please bring cases for these belongings   For patients admitted to the hospital, discharge time will be determined by your treatment team.   Patients discharged the day of surgery will not be allowed to drive home, and someone needs to stay with them for 24 hours.   SURGICAL WAITING ROOM VISITATION Patients having surgery or a procedure may have no more than 2 support people in the waiting area - these visitors may rotate.   Children under the age of 78 must have an adult with them who is not the patient. If the patient needs to stay at the hospital during part of their recovery, the visitor guidelines for inpatient rooms apply. Pre-op nurse will coordinate an appropriate time for 1 support person to accompany patient in pre-op.  This support person may not rotate.   Please refer to  [email or web address] for the visitor guidelines for Inpatients (after your surgery is over and you are in a regular room).    Special instructions:    Oral Hygiene is also important to reduce your risk of infection.  Remember - BRUSH YOUR TEETH THE MORNING OF SURGERY WITH YOUR REGULAR TOOTHPASTE   [facility]- Preparing For Surgery  Before surgery, you can play an important role. Because skin is not sterile, your skin needs to be as free of germs as possible. You can reduce the number of germs on your skin by washing with CHG (chlorahexidine gluconate) Soap before surgery.  CHG is an antiseptic cleaner which kills germs and bonds with the skin to continue killing germs even after washing.     Please do not use if you have an allergy to CHG or antibacterial soaps. If your skin becomes reddened/irritated stop using the CHG.  Do not shave (including legs and underarms) for at least 48 hours prior to first CHG shower. It is OK to shave your face.  Please follow these instructions carefully.     Shower the NIGHT BEFORE SURGERY and the MORNING OF SURGERY with CHG Soap.   If you chose to wash your hair, wash your hair first as usual with your normal shampoo. After you shampoo, rinse your hair and body thoroughly to remove the shampoo.  Then [employer] and genitals (private parts) with your normal soap and rinse thoroughly to remove soap.  After that Use CHG Soap as you would any other liquid soap. You can apply CHG directly to the skin and wash gently with a scrungie or a clean washcloth.   Apply the CHG Soap to your body ONLY FROM THE NECK DOWN.  Do not use on open wounds or open sores. Avoid contact with your eyes, ears, mouth and genitals (private parts). Wash Face and genitals (private parts)  with your normal soap.   Wash thoroughly, paying special attention to the area where your surgery will be performed.  Thoroughly rinse your body with  warm water from the neck down.  DO NOT shower/wash with your normal soap after using and rinsing off the CHG Soap.  Pat yourself dry with a CLEAN TOWEL.  Wear CLEAN PAJAMAS to bed the night before surgery  Place CLEAN SHEETS on your bed the night before your surgery  DO NOT SLEEP WITH PETS.   Day of Surgery: Take a shower with CHG soap. Wear Clean/Comfortable clothing the morning of surgery Do not apply any deodorants/lotions.   Remember to brush your teeth WITH YOUR REGULAR TOOTHPASTE.    If you received a COVID test during your pre-op visit, it is requested that you wear a mask when out in public, stay away from anyone that may not be feeling well, and notify your surgeon if you develop symptoms. If you have been in contact with anyone that has tested positive in the last 10 days, please notify your surgeon.    Please read over the following fact sheets that you were given.

## 2022-04-26 ENCOUNTER — Encounter (HOSPITAL_COMMUNITY): Payer: Self-pay | Admitting: Certified Registered"

## 2022-04-26 ENCOUNTER — Encounter (HOSPITAL_COMMUNITY): Payer: Self-pay | Admitting: Physician Assistant

## 2022-04-26 LAB — HEMOGLOBIN A1C
Hgb A1c MFr Bld: 8.5 % — ABNORMAL HIGH (ref 4.8–5.6)
Mean Plasma Glucose: 197 mg/dL

## 2022-04-26 NOTE — Progress Notes (Signed)
Anesthesia Chart Review:  Follows with cardiology for hx of HTN, HLD, CAD (mild to moderate stenosis by coronary CTA 11/2020), carotid disease (49 to A999333 LICA by duplex [id number]), PAD s/p revascularization of RLE, paroxysmal afib s/p prior ablations 03/2019 and 05/2019.  Last seen 12/07/2021 and noted to be maintaining sinus rhythm.  No changes made to management, recommended follow-up on carotid duplex in 6 months.  No changes to management.  Per surgeons office, patient to hold Eliquis 3 days prior to surgery.  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  She is also on once weekly GLP-1 agonist Ozempic, last dose reported 04/23/2022.  History of hypothyroidism, maintained on methimazole.  Preop labs reviewed, unremarkable.  EKG 07/12/2021: Sinus  Rhythm rate 70. Low voltage in limb leads.  -  Negative precordial T-waves  -Probably normal -consider anteroseptal ischemia.   Carotid artery duplex 10/03/2021:  Duplex suggests stenosis in the right internal carotid artery (minimal).  Heterogeneous plaque.  Duplex suggests stenosis in the left internal carotid artery (50-69%).  Heterogeneous plaque.  Antegrade right vertebral artery flow. Antegrade left vertebral artery  flow.  No significant change from 01/17/2021. Follow up in six months is  appropriate if clinically indicated.    Coronary CTA 12/08/2020: IMPRESSION: 1. Total coronary calcium score of 457. This was 99th percentile for age and sex matched control.   2. Normal coronary origin with left dominance.   3. CAD-RADS = 3.   Minimal stenosis (0-24%) at the ostial left main due to calcified plaque.   Mild stenosis (25-49%) at distal left main/ostial LAD due to calcified plaque. Moderate stenosis (50-69%) at mid LAD due to calcified plaque. Mid to distal LAD is patent.   LCX is patent with diffuse luminal irregularities no evidence of significant stenosis.   Mild stenosis (25-49%) at proximal RCA due to calcified plaque. Mid to distal RCA  is patent.   4.  Aortic Atherosclerosis.   RECOMMENDATIONS:   Consider symptom-guided anti-ischemic pharmacotherapy as well as risk factor modification per guideline directed care for secondary prevention.   Due to sub-optimal study quality additional analysis by CT-FFR is not available.   Consider invasive angiography if patient continues to have anginal discomfort despite uptitration of anti-anginal therapy. Clinical correlation is required.    PCV ECHOCARDIOGRAM COMPLETE 12/08/2020 Hyperdynamic LV systolic function with visual EF >70%. Left ventricle cavity is normal in size. Moderate left ventricular hypertrophy. Normal global wall motion. Normal diastolic filling pattern, normal LAP. Mild (Grade I) mitral regurgitation. Mild tricuspid regurgitation. No prior study for comparison.

## 2022-04-27 ENCOUNTER — Encounter: Payer: Self-pay | Admitting: Cardiology

## 2022-04-27 NOTE — Anesthesia Preprocedure Evaluation (Deleted)
Anesthesia Evaluation    Airway        Dental   Pulmonary Current Smoker and Patient abstained from smoking.          Cardiovascular hypertension,      Neuro/Psych    GI/Hepatic   Endo/Other  diabetes    Renal/GU      Musculoskeletal   Abdominal   Peds  Hematology   Anesthesia Other Findings   Reproductive/Obstetrics                             Anesthesia Physical Anesthesia Plan  ASA:   Anesthesia Plan:    Post-op Pain Management:    Induction:   PONV Risk Score and Plan:   Airway Management Planned:   Additional Equipment:   Intra-op Plan:   Post-operative Plan:   Informed Consent:   Plan Discussed with:   Anesthesia Plan Comments: (Case cancelled due to insurance approval.    PAT note by Karoline Caldwell, PA-C: Follows with cardiology for hx of HTN, HLD, CAD (mild to moderate stenosis by coronary CTA 11/2020), carotid disease (49 to A999333 LICA by duplex Q000111Q), PAD s/p revascularization of RLE, paroxysmal afib s/p prior ablations 03/2019 and 05/2019.  Last seen 12/07/2021 and noted to be maintaining sinus rhythm.  No changes made to management, recommended follow-up on carotid duplex in 6 months.  No changes to management.  Cardiac clearance per note by Dr. Terri Skains 04/27/22, "She is considered to be overall low risk for upcoming noncardiac surgery. She is on Eliquis for paroxysmal atrial fibrillation. Hold Eliquis for 3 days prior to her surgery on 05/01/2022. Patient is asked to discuss when to restart anticoagulation with her surgeon based on wound healing and appropriate hemostasis. She is also educated on being cognizant of her symptoms of stroke due to the interruption in anticoagulation for her upcoming surgery. If she has any focal neurological deficits she is advised to go to the ER for further evaluation and management. 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  She is also on once weekly GLP-1 agonist Ozempic, last dose reported 04/23/2022.  History of hyperthyroidism, maintained on methimazole.  Preop labs reviewed, unremarkable.  EKG 07/12/2021: Sinus  Rhythm rate 70. Low voltage in limb leads.  -  Negative precordial T-waves  -Probably normal -consider anteroseptal ischemia.   Carotid artery duplex 10/03/2021: Duplex suggests stenosis in the right internal carotid artery (minimal).  Heterogeneous plaque.  Duplex suggests stenosis in the left internal carotid artery (50-69%).  Heterogeneous plaque.  Antegrade right vertebral artery flow. Antegrade left vertebral artery  flow.  No significant change from 01/17/2021. Follow up in six months is  appropriate if clinically indicated.   Coronary CTA 12/08/2020: IMPRESSION: 1. Total coronary calcium score of 457. This was 99th percentile for age and sex matched control.  2. Normal coronary origin with left dominance.  3. CAD-RADS = 3.  Minimal stenosis (0-24%) at the ostial left main due to calcified plaque.  Mild stenosis (25-49%) at distal left main/ostial LAD due to calcified plaque. Moderate stenosis (50-69%) at mid LAD due to calcified plaque. Mid to distal LAD is patent.  LCX is patent with diffuse luminal irregularities no evidence of significant stenosis.  Mild stenosis (25-49%) at proximal RCA due to calcified plaque. Mid to distal RCA is patent.  4. Aortic Atherosclerosis.  RECOMMENDATIONS:  Consider symptom-guided anti-ischemic pharmacotherapy as well as risk  factor modification per guideline directed care for secondary prevention.  Due to sub-optimal study quality additional analysis by CT-FFR is not available.  Consider invasive angiography if patient continues to have anginal discomfort despite uptitration of anti-anginal therapy. Clinical correlation is required.   PCV ECHOCARDIOGRAM COMPLETE  12/08/2020 Hyperdynamic LV systolic function with visual EF >70%. Left ventricle cavity is normal in size. Moderate left ventricular hypertrophy. Normal global wall motion. Normal diastolic filling pattern, normal LAP. Mild (Grade I) mitral regurgitation. Mild tricuspid regurgitation. No prior study for comparison.   )        Anesthesia Quick Evaluation

## 2022-04-27 NOTE — Progress Notes (Signed)
ON-CALL CARDIOLOGY 04/27/22  Patient's name: Carmen Armstrong.   MRN: [id number].    DOB: [birth year] Primary care provider: [name], MD.  Interaction regarding this patient's care today: Preoperative restratification for spinal cord stimulator placement.  Reviewed electronic medical records.  Spoke to the patient over the phone she denies anginal discomfort or heart failure symptoms. Last echocardiogram noted hyperdynamic LVEF without any severe valvular heart disease. Last coronary CTA results reviewed. No change in overall functional capacity. No prior history of MI/CVA/TIA/DVT/PE per patient. She does have diabetes for which she takes [place]. And renal function is stable (serum creatinine <2 mg/dL).  Impression: Preoperative risk stratification  Recommendations: She is considered to be overall low risk for upcoming noncardiac surgery. She is on Eliquis for paroxysmal atrial fibrillation. Hold Eliquis for 3 days prior to her surgery on 05/01/2022. Patient is asked to discuss when to restart anticoagulation with her surgeon based on wound healing and appropriate hemostasis. She is also educated on being cognizant of her symptoms of stroke due to the interruption in anticoagulation for her upcoming surgery.  If she has any focal neurological deficits she is advised to go to the ER for further evaluation and management.  Patient verbalized understanding and is acceptable for preoperative risk  Telephone encounter total time: 7 minutes  Mechele [facility]  Pager:  [phone] Office: [phone]

## 2022-05-01 ENCOUNTER — Ambulatory Visit (HOSPITAL_COMMUNITY)
Admission: RE | Admit: 2022-05-01 | Discharge: 2022-05-01 | Disposition: A | Discharge status: Home / Self Care | Payer: 59 | Source: Ambulatory Visit | Attending: Orthopedic Surgery | Admitting: Orthopedic Surgery

## 2022-05-01 ENCOUNTER — Encounter (HOSPITAL_COMMUNITY)
Admission: RE | Disposition: A | Discharge status: Home / Self Care | Payer: Self-pay | Source: Ambulatory Visit | Attending: Orthopedic Surgery

## 2022-05-01 ENCOUNTER — Encounter (HOSPITAL_COMMUNITY): Payer: Self-pay | Admitting: Orthopedic Surgery

## 2022-05-01 ENCOUNTER — Other Ambulatory Visit: Payer: Self-pay

## 2022-05-01 DIAGNOSIS — Z538 Procedure and treatment not carried out for other reasons: Secondary | ICD-10-CM | POA: Insufficient documentation

## 2022-05-01 DIAGNOSIS — Z4542 Encounter for adjustment and management of neuropacemaker (brain) (peripheral nerve) (spinal cord): Secondary | ICD-10-CM | POA: Diagnosis not present

## 2022-05-01 DIAGNOSIS — Z01818 Encounter for other preprocedural examination: Secondary | ICD-10-CM

## 2022-05-01 DIAGNOSIS — E119 Type 2 diabetes mellitus without complications: Secondary | ICD-10-CM

## 2022-05-01 LAB — GLUCOSE, CAPILLARY: Glucose-Capillary: 147 mg/dL — ABNORMAL HIGH (ref 70–99)

## 2022-05-01 LAB — SURGICAL PCR SCREEN
MRSA, PCR: NEGATIVE
Staphylococcus aureus: NEGATIVE

## 2022-05-01 SURGERY — CERVICAL SPINAL CORD STIMULATOR INSERTION
Anesthesia: General

## 2022-05-01 MED ORDER — MIDAZOLAM HCL 2 MG/2ML IJ SOLN
INTRAMUSCULAR | Status: AC
  Filled 2022-05-01: qty 2

## 2022-05-01 MED ORDER — ORAL CARE MOUTH RINSE: 15.0000 mL | Freq: Once | OROMUCOSAL | Status: AC

## 2022-05-01 MED ORDER — CHLORHEXIDINE GLUCONATE 0.12 % MT SOLN
OROMUCOSAL | Status: AC
  Administered 2022-05-01: 15 mL via OROMUCOSAL
  Filled 2022-05-01: qty 15

## 2022-05-01 MED ORDER — PHENYLEPHRINE 80 MCG/ML (10ML) SYRINGE FOR IV PUSH (FOR BLOOD PRESSURE SUPPORT)
PREFILLED_SYRINGE | INTRAVENOUS | Status: AC
  Filled 2022-05-01: qty 10

## 2022-05-01 MED ORDER — ONDANSETRON HCL 4 MG/2ML IJ SOLN
INTRAMUSCULAR | Status: AC
  Filled 2022-05-01: qty 2

## 2022-05-01 MED ORDER — ROCURONIUM BROMIDE 10 MG/ML (PF) SYRINGE
PREFILLED_SYRINGE | INTRAVENOUS | Status: AC
  Filled 2022-05-01: qty 10

## 2022-05-01 MED ORDER — CHLORHEXIDINE GLUCONATE 0.12 % MT SOLN: 15.0000 mL | Freq: Once | OROMUCOSAL | Status: AC

## 2022-05-01 MED ORDER — TRANEXAMIC ACID-NACL 1000-0.7 MG/100ML-% IV SOLN
INTRAVENOUS | Status: AC
  Filled 2022-05-01: qty 100

## 2022-05-01 MED ORDER — PROPOFOL 10 MG/ML IV BOLUS
INTRAVENOUS | Status: AC
  Filled 2022-05-01: qty 20

## 2022-05-01 MED ORDER — LIDOCAINE 2% (20 MG/ML) 5 ML SYRINGE
INTRAMUSCULAR | Status: AC
  Filled 2022-05-01: qty 5

## 2022-05-01 MED ORDER — EPHEDRINE 5 MG/ML INJ
INTRAVENOUS | Status: AC
  Filled 2022-05-01: qty 5

## 2022-05-01 MED ORDER — TRANEXAMIC ACID-NACL 1000-0.7 MG/100ML-% IV SOLN: 1000.0000 mg | INTRAVENOUS | Status: DC

## 2022-05-01 MED ORDER — FENTANYL CITRATE (PF) 250 MCG/5ML IJ SOLN
INTRAMUSCULAR | Status: AC
  Filled 2022-05-01: qty 5

## 2022-05-01 MED ORDER — LACTATED RINGERS IV SOLN: INTRAVENOUS | Status: DC

## 2022-05-01 MED ORDER — DEXAMETHASONE SODIUM PHOSPHATE 10 MG/ML IJ SOLN
INTRAMUSCULAR | Status: AC
  Filled 2022-05-01: qty 1

## 2022-05-01 NOTE — Progress Notes (Signed)
Pt procedure cancelled due to insurance not verifying coverage for procedure.  Dr Rolena Infante spoke with patient and they will follow up for insurance approval at a later date.

## 2022-05-05 NOTE — Progress Notes (Signed)
Pt made aware of surgery date change Mon 3/25 0730-1000, arrival 0530, and to follow previous pre-op isntructions.

## 2022-05-07 NOTE — Anesthesia Preprocedure Evaluation (Signed)
Anesthesia Evaluation  Patient identified by MRN, date of birth, ID band Patient awake    Reviewed: Allergy & Precautions, NPO status , Patient's Chart, lab work & pertinent test results, reviewed documented beta blocker date and time   History of Anesthesia Complications Negative for: history of anesthetic complications  Airway Mallampati: II  TM Distance: >3 FB Neck ROM: Full    Dental  (+) Edentulous Upper, Edentulous Lower   Pulmonary Current Smoker and Patient abstained from smoking.   breath sounds clear to auscultation       Cardiovascular hypertension, Pt. on medications and Pt. on home beta blockers + CAD and + Peripheral Vascular Disease  + dysrhythmias Atrial Fibrillation  Rhythm:Regular Rate:Normal  '22 ECHO: EF >70%, mild LVH, normal LVF, Grade 1 DD, mild MR, mild TR   Neuro/Psych   Anxiety Depression    Chronic back pain: narcotics:     GI/Hepatic Neg liver ROS,GERD  Medicated and Controlled,,  Endo/Other  diabetes (glu 173)Hypothyroidism  Ozempic: last shot 04/30/2022  Renal/GU negative Renal ROS     Musculoskeletal  (+) Arthritis ,    Abdominal  (+) + obese  Peds  Hematology Eliquis: last dose Thursday    Anesthesia Other Findings   Reproductive/Obstetrics                             Anesthesia Physical Anesthesia Plan  ASA: 3  Anesthesia Plan: General   Post-op Pain Management: Tylenol PO (pre-op)*   Induction: Intravenous  PONV Risk Score and Plan: 2 and Ondansetron and Dexamethasone  Airway Management Planned: Oral ETT  Additional Equipment: None  Intra-op Plan:   Post-operative Plan: Extubation in OR  Informed Consent: I have reviewed the patients History and Physical, chart, labs and discussed the procedure including the risks, benefits and alternatives for the proposed anesthesia with the patient or authorized representative who has indicated his/her  understanding and acceptance.       Plan Discussed with: CRNA and Surgeon  Anesthesia Plan Comments:         Anesthesia Quick Evaluation

## 2022-05-08 ENCOUNTER — Ambulatory Visit (HOSPITAL_COMMUNITY): Payer: 59

## 2022-05-08 ENCOUNTER — Observation Stay (HOSPITAL_COMMUNITY)
Admission: RE | Admit: 2022-05-08 | Discharge: 2022-05-09 | Disposition: A | Discharge status: Home / Self Care | Payer: 59 | Attending: Orthopedic Surgery | Admitting: Orthopedic Surgery

## 2022-05-08 ENCOUNTER — Encounter (HOSPITAL_COMMUNITY)
Admission: RE | Disposition: A | Discharge status: Home / Self Care | Payer: Self-pay | Source: Home / Self Care | Attending: Orthopedic Surgery

## 2022-05-08 ENCOUNTER — Ambulatory Visit (HOSPITAL_BASED_OUTPATIENT_CLINIC_OR_DEPARTMENT_OTHER): Payer: 59 | Admitting: Anesthesiology

## 2022-05-08 ENCOUNTER — Encounter (HOSPITAL_COMMUNITY): Payer: Self-pay | Admitting: Orthopedic Surgery

## 2022-05-08 ENCOUNTER — Ambulatory Visit (HOSPITAL_COMMUNITY): Payer: 59 | Admitting: Anesthesiology

## 2022-05-08 ENCOUNTER — Other Ambulatory Visit: Payer: Self-pay

## 2022-05-08 DIAGNOSIS — E119 Type 2 diabetes mellitus without complications: Secondary | ICD-10-CM | POA: Insufficient documentation

## 2022-05-08 DIAGNOSIS — M961 Postlaminectomy syndrome, not elsewhere classified: Secondary | ICD-10-CM

## 2022-05-08 DIAGNOSIS — I2510 Atherosclerotic heart disease of native coronary artery without angina pectoris: Secondary | ICD-10-CM | POA: Insufficient documentation

## 2022-05-08 DIAGNOSIS — E039 Hypothyroidism, unspecified: Secondary | ICD-10-CM | POA: Insufficient documentation

## 2022-05-08 DIAGNOSIS — Z79899 Other long term (current) drug therapy: Secondary | ICD-10-CM | POA: Insufficient documentation

## 2022-05-08 DIAGNOSIS — F17210 Nicotine dependence, cigarettes, uncomplicated: Secondary | ICD-10-CM

## 2022-05-08 DIAGNOSIS — Z7901 Long term (current) use of anticoagulants: Secondary | ICD-10-CM | POA: Insufficient documentation

## 2022-05-08 DIAGNOSIS — I509 Heart failure, unspecified: Secondary | ICD-10-CM | POA: Insufficient documentation

## 2022-05-08 DIAGNOSIS — I110 Hypertensive heart disease with heart failure: Secondary | ICD-10-CM | POA: Insufficient documentation

## 2022-05-08 DIAGNOSIS — E1151 Type 2 diabetes mellitus with diabetic peripheral angiopathy without gangrene: Secondary | ICD-10-CM

## 2022-05-08 DIAGNOSIS — I4891 Unspecified atrial fibrillation: Secondary | ICD-10-CM | POA: Insufficient documentation

## 2022-05-08 DIAGNOSIS — G8929 Other chronic pain: Secondary | ICD-10-CM | POA: Diagnosis present

## 2022-05-08 DIAGNOSIS — I10 Essential (primary) hypertension: Secondary | ICD-10-CM

## 2022-05-08 HISTORY — PX: SPINAL CORD STIMULATOR INSERTION: SHX5378

## 2022-05-08 LAB — GLUCOSE, CAPILLARY
Glucose-Capillary: 173 mg/dL — ABNORMAL HIGH (ref 70–99)
Glucose-Capillary: 138 mg/dL — ABNORMAL HIGH (ref 70–99)
Glucose-Capillary: 220 mg/dL — ABNORMAL HIGH (ref 70–99)
Glucose-Capillary: 257 mg/dL — ABNORMAL HIGH (ref 70–99)
Glucose-Capillary: 170 mg/dL — ABNORMAL HIGH (ref 70–99)
Glucose-Capillary: 131 mg/dL — ABNORMAL HIGH (ref 70–99)

## 2022-05-08 SURGERY — CERVICAL SPINAL CORD STIMULATOR INSERTION
Anesthesia: General | Site: Spine Thoracic

## 2022-05-08 MED ORDER — CHLORHEXIDINE GLUCONATE 0.12 % MT SOLN
15.0000 mL | Freq: Once | OROMUCOSAL | Status: AC
  Administered 2022-05-08: 15 mL via OROMUCOSAL
  Filled 2022-05-08: qty 15

## 2022-05-08 MED ORDER — HYDROMORPHONE HCL 1 MG/ML IJ SOLN: 0.2500 mg | INTRAMUSCULAR | Status: DC | PRN

## 2022-05-08 MED ORDER — LACTATED RINGERS IV SOLN: INTRAVENOUS | Status: DC

## 2022-05-08 MED ORDER — CEFAZOLIN SODIUM-DEXTROSE 2-4 GM/100ML-% IV SOLN
INTRAVENOUS | Status: AC
  Filled 2022-05-08: qty 100

## 2022-05-08 MED ORDER — EPHEDRINE 5 MG/ML INJ
INTRAVENOUS | Status: AC
  Filled 2022-05-08: qty 5

## 2022-05-08 MED ORDER — TRANEXAMIC ACID 1000 MG/10ML IV SOLN: INTRAVENOUS | Status: DC | PRN

## 2022-05-08 MED ORDER — CEFAZOLIN SODIUM-DEXTROSE 1-4 GM/50ML-% IV SOLN
1.0000 g | Freq: Three times a day (TID) | INTRAVENOUS | Status: AC
  Administered 2022-05-08 (×2): 1 g via INTRAVENOUS
  Filled 2022-05-08 (×2): qty 50

## 2022-05-08 MED ORDER — TRANEXAMIC ACID 1000 MG/10ML IV SOLN
INTRAVENOUS | Status: DC | PRN
  Administered 2022-05-08: 1000 mg via INTRAVENOUS

## 2022-05-08 MED ORDER — POLYETHYLENE GLYCOL 3350 17 G PO PACK
17.0000 g | PACK | Freq: Every day | ORAL | Status: DC | PRN
  Administered 2022-05-08: 17 g via ORAL
  Filled 2022-05-08: qty 1

## 2022-05-08 MED ORDER — ONDANSETRON HCL 4 MG/2ML IJ SOLN: 4.0000 mg | Freq: Four times a day (QID) | INTRAMUSCULAR | Status: DC | PRN

## 2022-05-08 MED ORDER — GABAPENTIN 300 MG PO CAPS
300.0000 mg | ORAL_CAPSULE | Freq: Every day | ORAL | Status: DC
  Administered 2022-05-08 – 2022-05-09 (×2): 300 mg via ORAL
  Filled 2022-05-08 (×2): qty 1

## 2022-05-08 MED ORDER — 0.9 % SODIUM CHLORIDE (POUR BTL) OPTIME
TOPICAL | Status: DC | PRN
  Administered 2022-05-08 (×2): 1000 mL

## 2022-05-08 MED ORDER — NALOXONE HCL 0.4 MG/ML IJ SOLN: 0.4000 mg | INTRAMUSCULAR | Status: DC | PRN

## 2022-05-08 MED ORDER — ONDANSETRON HCL 4 MG/2ML IJ SOLN
INTRAMUSCULAR | Status: AC
  Filled 2022-05-08: qty 4

## 2022-05-08 MED ORDER — LIDOCAINE 2% (20 MG/ML) 5 ML SYRINGE
INTRAMUSCULAR | Status: DC | PRN
  Administered 2022-05-08: 40 mg via INTRAVENOUS

## 2022-05-08 MED ORDER — ONDANSETRON HCL 4 MG PO TABS: 4.0000 mg | ORAL_TABLET | Freq: Three times a day (TID) | ORAL | 0 refills | Status: DC | PRN

## 2022-05-08 MED ORDER — SURGIFLO WITH THROMBIN (HEMOSTATIC MATRIX KIT) OPTIME
TOPICAL | Status: DC | PRN
  Administered 2022-05-08: 1 via TOPICAL

## 2022-05-08 MED ORDER — TRANEXAMIC ACID-NACL 1000-0.7 MG/100ML-% IV SOLN
INTRAVENOUS | Status: AC
  Filled 2022-05-08: qty 100

## 2022-05-08 MED ORDER — ACETAMINOPHEN 500 MG PO TABS
1000.0000 mg | ORAL_TABLET | Freq: Once | ORAL | Status: AC
  Administered 2022-05-08: 1000 mg via ORAL
  Filled 2022-05-08: qty 2

## 2022-05-08 MED ORDER — PROMETHAZINE HCL 25 MG/ML IJ SOLN: 6.2500 mg | INTRAMUSCULAR | Status: DC | PRN

## 2022-05-08 MED ORDER — ATORVASTATIN CALCIUM 40 MG PO TABS
40.0000 mg | ORAL_TABLET | Freq: Every day | ORAL | Status: DC
  Administered 2022-05-08 – 2022-05-09 (×2): 40 mg via ORAL
  Filled 2022-05-08 (×2): qty 1

## 2022-05-08 MED ORDER — MENTHOL 3 MG MT LOZG: 1.0000 | LOZENGE | OROMUCOSAL | Status: DC | PRN

## 2022-05-08 MED ORDER — HYDROMORPHONE HCL 1 MG/ML IJ SOLN
INTRAMUSCULAR | Status: DC | PRN
  Administered 2022-05-08: .5 mg via INTRAVENOUS

## 2022-05-08 MED ORDER — LIDOCAINE 2% (20 MG/ML) 5 ML SYRINGE
INTRAMUSCULAR | Status: AC
  Filled 2022-05-08: qty 5

## 2022-05-08 MED ORDER — INSULIN ASPART 100 UNIT/ML IJ SOLN: 0.0000 [IU] | Freq: Every day | INTRAMUSCULAR | Status: DC

## 2022-05-08 MED ORDER — NALOXONE HCL 4 MG/0.1ML NA LIQD: 1.0000 | NASAL | Status: DC | PRN

## 2022-05-08 MED ORDER — PROPOFOL 10 MG/ML IV BOLUS
INTRAVENOUS | Status: DC | PRN
  Administered 2022-05-08: 100 mg via INTRAVENOUS

## 2022-05-08 MED ORDER — MIDAZOLAM HCL 2 MG/2ML IJ SOLN
INTRAMUSCULAR | Status: AC
  Filled 2022-05-08: qty 2

## 2022-05-08 MED ORDER — IRBESARTAN 75 MG PO TABS
37.5000 mg | ORAL_TABLET | Freq: Every day | ORAL | Status: DC
  Administered 2022-05-08 – 2022-05-09 (×2): 37.5 mg via ORAL
  Filled 2022-05-08 (×2): qty 1

## 2022-05-08 MED ORDER — ACETAMINOPHEN 325 MG PO TABS
650.0000 mg | ORAL_TABLET | ORAL | Status: DC | PRN
  Administered 2022-05-09: 325 mg via ORAL
  Filled 2022-05-08: qty 2

## 2022-05-08 MED ORDER — METHOCARBAMOL 500 MG PO TABS: 500.0000 mg | ORAL_TABLET | Freq: Three times a day (TID) | ORAL | 0 refills | Status: AC | PRN

## 2022-05-08 MED ORDER — KETAMINE HCL 50 MG/ML IJ SOLN
INTRAMUSCULAR | Status: DC | PRN
  Administered 2022-05-08: 25 mg via INTRAMUSCULAR

## 2022-05-08 MED ORDER — SUGAMMADEX SODIUM 200 MG/2ML IV SOLN
INTRAVENOUS | Status: DC | PRN
  Administered 2022-05-08: 200 mg via INTRAVENOUS

## 2022-05-08 MED ORDER — EPHEDRINE SULFATE-NACL 50-0.9 MG/10ML-% IV SOSY
PREFILLED_SYRINGE | INTRAVENOUS | Status: DC | PRN
  Administered 2022-05-08 (×2): 10 mg via INTRAVENOUS

## 2022-05-08 MED ORDER — PROPOFOL 10 MG/ML IV BOLUS
INTRAVENOUS | Status: AC
  Filled 2022-05-08: qty 20

## 2022-05-08 MED ORDER — DAPAGLIFLOZIN PRO-METFORMIN ER 10-1000 MG PO TB24: 1.0000 | ORAL_TABLET | Freq: Two times a day (BID) | ORAL | Status: DC

## 2022-05-08 MED ORDER — SODIUM CHLORIDE 0.9% FLUSH
3.0000 mL | Freq: Two times a day (BID) | INTRAVENOUS | Status: DC
  Administered 2022-05-08: 3 mL via INTRAVENOUS

## 2022-05-08 MED ORDER — SERTRALINE HCL 100 MG PO TABS
100.0000 mg | ORAL_TABLET | Freq: Two times a day (BID) | ORAL | Status: DC
  Administered 2022-05-08 – 2022-05-09 (×3): 100 mg via ORAL
  Filled 2022-05-08 (×3): qty 1

## 2022-05-08 MED ORDER — EPINEPHRINE PF 1 MG/ML IJ SOLN
INTRAMUSCULAR | Status: AC
  Filled 2022-05-08: qty 1

## 2022-05-08 MED ORDER — SODIUM CHLORIDE 0.9 % IV SOLN
250.0000 mL | INTRAVENOUS | Status: DC
  Administered 2022-05-08: 250 mL via INTRAVENOUS

## 2022-05-08 MED ORDER — FENTANYL CITRATE (PF) 250 MCG/5ML IJ SOLN
INTRAMUSCULAR | Status: DC | PRN
  Administered 2022-05-08: 250 ug via INTRAVENOUS

## 2022-05-08 MED ORDER — PHENOL 1.4 % MT LIQD: 1.0000 | OROMUCOSAL | Status: DC | PRN

## 2022-05-08 MED ORDER — SODIUM CHLORIDE 0.9% FLUSH: 3.0000 mL | INTRAVENOUS | Status: DC | PRN

## 2022-05-08 MED ORDER — OXYCODONE HCL 5 MG/5ML PO SOLN: 5.0000 mg | Freq: Once | ORAL | Status: DC | PRN

## 2022-05-08 MED ORDER — INSULIN ASPART 100 UNIT/ML IJ SOLN
0.0000 [IU] | Freq: Three times a day (TID) | INTRAMUSCULAR | Status: DC
  Administered 2022-05-08: 3 [IU] via SUBCUTANEOUS
  Administered 2022-05-08: 8 [IU] via SUBCUTANEOUS

## 2022-05-08 MED ORDER — CEFAZOLIN SODIUM-DEXTROSE 2-3 GM-%(50ML) IV SOLR
INTRAVENOUS | Status: DC | PRN
  Administered 2022-05-08: 2 g via INTRAVENOUS

## 2022-05-08 MED ORDER — MORPHINE SULFATE ER 15 MG PO TBCR
15.0000 mg | EXTENDED_RELEASE_TABLET | Freq: Three times a day (TID) | ORAL | Status: DC | PRN
  Administered 2022-05-08 – 2022-05-09 (×3): 15 mg via ORAL
  Filled 2022-05-08 (×3): qty 1

## 2022-05-08 MED ORDER — METHOCARBAMOL 1000 MG/10ML IJ SOLN: 500.0000 mg | Freq: Four times a day (QID) | INTRAVENOUS | Status: DC | PRN

## 2022-05-08 MED ORDER — DEXAMETHASONE SODIUM PHOSPHATE 10 MG/ML IJ SOLN
INTRAMUSCULAR | Status: DC | PRN
  Administered 2022-05-08: 5 mg via INTRAVENOUS

## 2022-05-08 MED ORDER — DAPAGLIFLOZIN PROPANEDIOL 10 MG PO TABS
10.0000 mg | ORAL_TABLET | Freq: Two times a day (BID) | ORAL | Status: DC
  Administered 2022-05-08 – 2022-05-09 (×2): 10 mg via ORAL
  Filled 2022-05-08 (×3): qty 1

## 2022-05-08 MED ORDER — ROCURONIUM BROMIDE 10 MG/ML (PF) SYRINGE
PREFILLED_SYRINGE | INTRAVENOUS | Status: DC | PRN
  Administered 2022-05-08: 10 mg via INTRAVENOUS
  Administered 2022-05-08: 60 mg via INTRAVENOUS
  Administered 2022-05-08: 20 mg via INTRAVENOUS

## 2022-05-08 MED ORDER — ACETAMINOPHEN 650 MG RE SUPP: 650.0000 mg | RECTAL | Status: DC | PRN

## 2022-05-08 MED ORDER — FLUOXETINE HCL 20 MG PO CAPS
20.0000 mg | ORAL_CAPSULE | Freq: Every day | ORAL | Status: DC
  Administered 2022-05-08 – 2022-05-09 (×2): 20 mg via ORAL
  Filled 2022-05-08 (×2): qty 1

## 2022-05-08 MED ORDER — BUPIVACAINE-EPINEPHRINE 0.25% -1:200000 IJ SOLN
INTRAMUSCULAR | Status: DC | PRN
  Administered 2022-05-08: 20 mL

## 2022-05-08 MED ORDER — OXYCODONE-ACETAMINOPHEN 5-325 MG PO TABS
0.5000 | ORAL_TABLET | Freq: Two times a day (BID) | ORAL | Status: DC | PRN
  Administered 2022-05-08 – 2022-05-09 (×3): 1 via ORAL
  Filled 2022-05-08 (×3): qty 1

## 2022-05-08 MED ORDER — LINACLOTIDE 145 MCG PO CAPS
145.0000 ug | ORAL_CAPSULE | Freq: Every day | ORAL | Status: DC
  Administered 2022-05-08 – 2022-05-09 (×2): 145 ug via ORAL
  Filled 2022-05-08 (×2): qty 1

## 2022-05-08 MED ORDER — METHIMAZOLE 5 MG PO TABS
5.0000 mg | ORAL_TABLET | Freq: Every day | ORAL | Status: DC
  Administered 2022-05-08 – 2022-05-09 (×2): 5 mg via ORAL
  Filled 2022-05-08 (×2): qty 1

## 2022-05-08 MED ORDER — ONDANSETRON HCL 4 MG/2ML IJ SOLN
INTRAMUSCULAR | Status: DC | PRN
  Administered 2022-05-08: 4 mg via INTRAVENOUS

## 2022-05-08 MED ORDER — KETAMINE HCL 50 MG/5ML IJ SOSY
PREFILLED_SYRINGE | INTRAMUSCULAR | Status: AC
  Filled 2022-05-08: qty 5

## 2022-05-08 MED ORDER — METOPROLOL SUCCINATE ER 100 MG PO TB24
100.0000 mg | ORAL_TABLET | Freq: Every day | ORAL | Status: DC
  Administered 2022-05-09: 100 mg via ORAL
  Filled 2022-05-08: qty 1

## 2022-05-08 MED ORDER — ROCURONIUM BROMIDE 10 MG/ML (PF) SYRINGE
PREFILLED_SYRINGE | INTRAVENOUS | Status: AC
  Filled 2022-05-08: qty 10

## 2022-05-08 MED ORDER — AMLODIPINE BESYLATE 10 MG PO TABS
10.0000 mg | ORAL_TABLET | Freq: Every day | ORAL | Status: DC
  Administered 2022-05-08 – 2022-05-09 (×2): 10 mg via ORAL
  Filled 2022-05-08 (×2): qty 1

## 2022-05-08 MED ORDER — PHENYLEPHRINE HCL-NACL 20-0.9 MG/250ML-% IV SOLN
INTRAVENOUS | Status: DC | PRN
  Administered 2022-05-08: 20 ug/min via INTRAVENOUS

## 2022-05-08 MED ORDER — PHENYLEPHRINE 80 MCG/ML (10ML) SYRINGE FOR IV PUSH (FOR BLOOD PRESSURE SUPPORT)
PREFILLED_SYRINGE | INTRAVENOUS | Status: AC
  Filled 2022-05-08: qty 10

## 2022-05-08 MED ORDER — OXYCODONE HCL 5 MG PO TABS: 5.0000 mg | ORAL_TABLET | Freq: Once | ORAL | Status: DC | PRN

## 2022-05-08 MED ORDER — DEXAMETHASONE SODIUM PHOSPHATE 10 MG/ML IJ SOLN
INTRAMUSCULAR | Status: AC
  Filled 2022-05-08: qty 2

## 2022-05-08 MED ORDER — METFORMIN HCL ER 500 MG PO TB24
1000.0000 mg | ORAL_TABLET | Freq: Two times a day (BID) | ORAL | Status: DC
  Administered 2022-05-08 – 2022-05-09 (×2): 1000 mg via ORAL
  Filled 2022-05-08 (×3): qty 2

## 2022-05-08 MED ORDER — ONDANSETRON HCL 4 MG PO TABS: 4.0000 mg | ORAL_TABLET | Freq: Four times a day (QID) | ORAL | Status: DC | PRN

## 2022-05-08 MED ORDER — BUPROPION HCL ER (XL) 300 MG PO TB24
300.0000 mg | ORAL_TABLET | Freq: Every morning | ORAL | Status: DC
  Administered 2022-05-08 – 2022-05-09 (×2): 300 mg via ORAL
  Filled 2022-05-08 (×2): qty 1

## 2022-05-08 MED ORDER — MIDAZOLAM HCL 2 MG/2ML IJ SOLN: 0.5000 mg | Freq: Once | INTRAMUSCULAR | Status: DC | PRN

## 2022-05-08 MED ORDER — METHOCARBAMOL 500 MG PO TABS
500.0000 mg | ORAL_TABLET | Freq: Four times a day (QID) | ORAL | Status: DC | PRN
  Administered 2022-05-08 – 2022-05-09 (×5): 500 mg via ORAL
  Filled 2022-05-08 (×5): qty 1

## 2022-05-08 MED ORDER — INSULIN ASPART 100 UNIT/ML IJ SOLN
0.0000 [IU] | INTRAMUSCULAR | Status: DC | PRN
  Administered 2022-05-08: 2 [IU] via SUBCUTANEOUS
  Filled 2022-05-08: qty 1

## 2022-05-08 MED ORDER — FENTANYL CITRATE (PF) 250 MCG/5ML IJ SOLN
INTRAMUSCULAR | Status: AC
  Filled 2022-05-08: qty 5

## 2022-05-08 MED ORDER — BUPIVACAINE HCL (PF) 0.25 % IJ SOLN
INTRAMUSCULAR | Status: AC
  Filled 2022-05-08: qty 30

## 2022-05-08 MED ORDER — MEPERIDINE HCL 25 MG/ML IJ SOLN: 6.2500 mg | INTRAMUSCULAR | Status: DC | PRN

## 2022-05-08 MED ORDER — HYDROMORPHONE HCL 1 MG/ML IJ SOLN
0.5000 mg | INTRAMUSCULAR | Status: DC | PRN
  Administered 2022-05-08 (×2): 0.5 mg via INTRAVENOUS
  Filled 2022-05-08: qty 0.5

## 2022-05-08 MED ORDER — ORAL CARE MOUTH RINSE: 15.0000 mL | Freq: Once | OROMUCOSAL | Status: AC

## 2022-05-08 MED ORDER — SPIRONOLACTONE 25 MG PO TABS
25.0000 mg | ORAL_TABLET | Freq: Every day | ORAL | Status: DC
  Administered 2022-05-09: 25 mg via ORAL
  Filled 2022-05-08: qty 1

## 2022-05-08 MED ORDER — MIDAZOLAM HCL 2 MG/2ML IJ SOLN
INTRAMUSCULAR | Status: DC | PRN
  Administered 2022-05-08: 2 mg via INTRAVENOUS

## 2022-05-08 MED ORDER — INSULIN ASPART 100 UNIT/ML IJ SOLN
0.0000 [IU] | Freq: Three times a day (TID) | INTRAMUSCULAR | Status: DC
  Administered 2022-05-09: 2 [IU] via SUBCUTANEOUS

## 2022-05-08 SURGICAL SUPPLY — 67 items
AGENT HMST KT MTR STRL THRMB (HEMOSTASIS) ×1
ANCH LD SWIFT-LCK ×2 IMPLANT
ANCHOR SWIFT LOCK IMPLANT
BAG COUNTER SPONGE SURGICOUNT (BAG) IMPLANT
BAG SPNG CNTER NS LX DISP (BAG)
CANISTER SUCT 3000ML PPV (MISCELLANEOUS) ×1 IMPLANT
CLSR STERI-STRIP ANTIMIC 1/2X4 (GAUZE/BANDAGES/DRESSINGS) ×1 IMPLANT
CONTROLLER MAGNET PT W/MANUAL (MISCELLANEOUS) IMPLANT
CONTROLLER NEUROSTIM PATIENT (NEUROSURGERY SUPPLIES) IMPLANT
COVER MAYO STAND STRL (DRAPES) ×1 IMPLANT
COVER PROBE W GEL 5X96 (DRAPES) IMPLANT
COVER SURGICAL LIGHT HANDLE (MISCELLANEOUS) ×1 IMPLANT
DRAPE C-ARM 42X72 X-RAY (DRAPES) ×1 IMPLANT
DRAPE SURG 17X23 STRL (DRAPES) ×1 IMPLANT
DRAPE U-SHAPE 47X51 STRL (DRAPES) ×1 IMPLANT
DRSG OPSITE POSTOP 4X6 (GAUZE/BANDAGES/DRESSINGS) ×1 IMPLANT
DURAPREP 26ML APPLICATOR (WOUND CARE) ×1 IMPLANT
ELECT BLADE 4.0 EZ CLEAN MEGAD (MISCELLANEOUS)
ELECT CAUTERY BLADE 6.4 (BLADE) IMPLANT
ELECT PENCIL ROCKER SW 15FT (MISCELLANEOUS) ×1 IMPLANT
ELECT REM PT RETURN 9FT ADLT (ELECTROSURGICAL) ×1
ELECTRODE BLDE 4.0 EZ CLN MEGD (MISCELLANEOUS) IMPLANT
ELECTRODE REM PT RTRN 9FT ADLT (ELECTROSURGICAL) ×1 IMPLANT
GENERATOR PROCLAIM PLUS 5 (Neuro Prosthesis/Implant) IMPLANT
GLOVE BIO SURGEON STRL SZ7 (GLOVE) ×1 IMPLANT
GLOVE BIOGEL PI IND STRL 7.0 (GLOVE) ×1 IMPLANT
GLOVE BIOGEL PI IND STRL 8.5 (GLOVE) ×1 IMPLANT
GLOVE SS N UNI LF 8.5 STRL (GLOVE) ×1 IMPLANT
GOWN STRL REUS W/ TWL LRG LVL3 (GOWN DISPOSABLE) ×2 IMPLANT
GOWN STRL REUS W/TWL 2XL LVL3 (GOWN DISPOSABLE) ×1 IMPLANT
GOWN STRL REUS W/TWL LRG LVL3 (GOWN DISPOSABLE) ×2
KIT BASIN OR (CUSTOM PROCEDURE TRAY) ×1 IMPLANT
KIT TURNOVER KIT B (KITS) ×1 IMPLANT
LEAD OCTRODE GEN 8CH 60CM (Lead) IMPLANT
NDL 22X1.5 STRL (OR ONLY) (MISCELLANEOUS) ×1 IMPLANT
NDL HYPO 18GX1.5 BLUNT FILL (NEEDLE) IMPLANT
NDL SPNL 18GX3.5 QUINCKE PK (NEEDLE) ×1 IMPLANT
NEEDLE 22X1.5 STRL (OR ONLY) (MISCELLANEOUS) ×1 IMPLANT
NEEDLE HYPO 18GX1.5 BLUNT FILL (NEEDLE) ×1 IMPLANT
NEEDLE SPNL 18GX3.5 QUINCKE PK (NEEDLE) ×1 IMPLANT
NS IRRIG 1000ML POUR BTL (IV SOLUTION) ×1 IMPLANT
PACK LAMINECTOMY ORTHO (CUSTOM PROCEDURE TRAY) ×1 IMPLANT
PACK UNIVERSAL I (CUSTOM PROCEDURE TRAY) ×1 IMPLANT
PAD ARMBOARD 7.5X6 YLW CONV (MISCELLANEOUS) ×3 IMPLANT
SPATULA SILICONE BRAIN 10MM (MISCELLANEOUS) IMPLANT
SPONGE SURGIFOAM ABS GEL 100 (HEMOSTASIS) ×1 IMPLANT
SPONGE T-LAP 4X18 ~~LOC~~+RFID (SPONGE) IMPLANT
STAPLER VISISTAT 35W (STAPLE) IMPLANT
SURGIFLO W/THROMBIN 8M KIT (HEMOSTASIS) ×1 IMPLANT
SUT BONE WAX W31G (SUTURE) ×1 IMPLANT
SUT ETHIBOND 2 OS 4 DA (SUTURE) ×1 IMPLANT
SUT MNCRL AB 3-0 PS2 18 (SUTURE) ×2 IMPLANT
SUT MNCRL+ AB 3-0 CT1 36 (SUTURE) IMPLANT
SUT MONOCRYL AB 3-0 CT1 36IN (SUTURE) ×2
SUT VIC AB 0 CT1 27 (SUTURE) ×1
SUT VIC AB 0 CT1 27XBRD ANBCTR (SUTURE) IMPLANT
SUT VIC AB 1 CT1 18XCR BRD 8 (SUTURE) ×2 IMPLANT
SUT VIC AB 1 CT1 8-18 (SUTURE) ×2
SUT VIC AB 2-0 CT1 18 (SUTURE) ×1 IMPLANT
SYR BULB IRRIG 60ML STRL (SYRINGE) ×1 IMPLANT
SYR CONTROL 10ML LL (SYRINGE) ×1 IMPLANT
SYR TB 1ML LUER SLIP (SYRINGE) IMPLANT
TOOL TUNNELING 20 (MISCELLANEOUS) IMPLANT
TOWEL GREEN STERILE (TOWEL DISPOSABLE) ×1 IMPLANT
TOWEL GREEN STERILE FF (TOWEL DISPOSABLE) ×1 IMPLANT
WATER STERILE IRR 1000ML POUR (IV SOLUTION) ×1 IMPLANT
YANKAUER SUCT BULB TIP NO VENT (SUCTIONS) ×1 IMPLANT

## 2022-05-08 NOTE — Anesthesia Procedure Notes (Signed)
Procedure Name: Intubation Date/Time: 05/08/2022 7:48 AM  Performed by: Lind Guest, CRNAPre-anesthesia Checklist: Patient identified, Emergency Drugs available, Suction available, Patient being monitored and Timeout performed Patient Re-evaluated:Patient Re-evaluated prior to induction Oxygen Delivery Method: Circle system utilized Preoxygenation: Pre-oxygenation with 100% oxygen Induction Type: IV induction Ventilation: Mask ventilation without difficulty Laryngoscope Size: Mac and 3 Grade View: Grade I Tube type: Oral Tube size: 7.0 mm Number of attempts: 1 Placement Confirmation: ETT inserted through vocal cords under direct vision, positive ETCO2 and breath sounds checked- equal and bilateral Secured at: 23 cm Tube secured with: Tape Dental Injury: Teeth and Oropharynx as per pre-operative assessment

## 2022-05-08 NOTE — Brief Op Note (Signed)
05/08/2022  10:23 AM  PATIENT:  Carmen Armstrong  57 y.o. 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

## 2022-05-08 NOTE — Anesthesia Postprocedure Evaluation (Signed)
Anesthesia Post Note  Patient: Carmen Armstrong  Procedure(s) Performed: PLACEMENT OF SPINAL CORD STIMULATOR (Spine Thoracic)     Patient location during evaluation: PACU Anesthesia Type: General Level of consciousness: awake and alert, patient cooperative and oriented Pain management: pain level controlled Vital Signs Assessment: post-procedure vital signs reviewed and stable Respiratory status: spontaneous breathing, nonlabored ventilation and respiratory function stable Cardiovascular status: blood pressure returned to baseline and stable Postop Assessment: no apparent nausea or vomiting Anesthetic complications: no   No notable events documented.  Last Vitals:  Vitals:   05/08/22 1115 05/08/22 1143  BP: 118/63 112/65  Pulse: 68 70  Resp: 14 18  Temp: 36.8 C 37.1 C  SpO2: 93% 95%    Last Pain:  Vitals:   05/08/22 1143  TempSrc: Oral  PainSc:                  Constance Whittle,E. Mckenna Gamm

## 2022-05-08 NOTE — Op Note (Signed)
OPERATIVE REPORT  DATE OF SURGERY: 05/08/2022  PATIENT NAME:  Carmen Armstrong MRN: [id number] DOB: 20-Jan-1966  PCP: [name], MD  PRE-OPERATIVE DIAGNOSIS: Failed back syndrome.  Status post prior L2-5 instrumented fusion with chronic pain.  Status post successful spinal cord stimulator trial  POST-OPERATIVE DIAGNOSIS: Same  PROCEDURE:   Implantation of permanent spinal cord stimulator  SURGEON:  Melina Schools, MD  PHYSICIAN ASSISTANT: None  ANESTHESIA:   General  EBL: 50 ml   Complications: None  Implants: Abbott spine: octrode x2.  Proclaim +5 implantable pulse generator  BRIEF HISTORY: Carmen Armstrong is a 57 y.o. female who has had prior lumbar fusion surgery unfortunately continues to have significant pain and loss in quality of life.  She ultimately had a spinal cord stimulator placed and had successful improvement during the trial.  As result of the successful trial we presented today for permanent implantation.  Appropriate risks, benefits, alternatives to surgery were discussed with the patient.  Consent was obtained.  PROCEDURE DETAILS: Patient was brought into the operating room and was properly positioned on the operating room table.  After induction with general anesthesia the patient was endotracheally intubated.  A timeout was taken to confirm all important data: including patient, procedure, and the level. Teds, SCD's were applied.   Patient was turned prone onto the Wilson frame and all bony prominences were well-padded.  The back was then prepped and draped in a standard fashion.  Both incision sites were marked and infiltrated with quarter percent Marcaine with epinephrine.  A posterior midline thoracic incision was made after we confirmed proper position with fluoroscopy.  Sharp dissection was carried out down to the deep fascia and the fascia was split and I stripped the paraspinal muscles to expose the inferior third of the T10 spinous process  and lamina the entire T11 posterior elements as well as the superior portion of the T12.  Fluoroscopy was then used to confirm the T10-11 interspinous process space.  I removed the inferior third of the spinous process of T10 and performed a laminotomy of T10 with a 2 mm Kerrison rongeur.  I then dissected through the ligamentum flavum and resected with a 2 mm Kerrison rongeur to expose the dorsal surface of the thecal sac.  The leads were then gently passed along the dorsal surface of the epidural space.  There was no resistance met.  Both came to rest just to the left of midline.  The leading edge did  cross over to the right side.  The patient did confirm she had significant left neuropathic leg pain and so the decision was made to place the 2 leads at the midline and just off to the left of midline.  I confirmed with the representative that the leads were properly positioned in both planes and would cover the same area that was covered during the trial.  Once this was confirmed I secured the leads with the locking lead protector to the spinous process of T11 with a #1 Ethibond suture.  Once the leads were secured I then passed them through the T11-12 interspinous process space in order that they be wrapped around the T11 spinous process and secured.  The second incision was made at the battery site and a pocket was created 2 and half centimeters in depth.  I then passed the leads from the first incision to the battery site incision.  At this point I irrigated both wounds copiously with normal saline and make sure that hemostasis using  bipolar cautery and Floseal.  Once I confirmed hemostasis I then disconnected the Bovie and bipolar and then brought the battery onto the field.  The leads were then secured into the battery and tightened according manufacture standards.  The battery was then placed in the pocket and the excess leads were wrapped and placed on the undersurface.  The battery was then secured to  the deep fascia with two #1 Vicryl sutures.  The system was then tested by the rep and was noted to be functioning without any issues.  Both's were then copiously irrigated with normal saline and closed with a #1 Vicryl suture layer.  The thoracic wound was then closed with a 0 Vicryl runner layer and then interrupted 2-0 Vicryl suture followed by 3-0 Monocryl for the skin.  The battery site was closed with interrupted 2-0 Vicryl sutures and a 3-0 Monocryl for the skin.  Steri-Strips dry dressings were applied and the patient was ultimately extubated transfer the PACU without incident.  The end of the case all sponge counts were correct.  There were no adverse intraoperative events.  Melina Schools, MD 05/08/2022 10:14 AM

## 2022-05-08 NOTE — Transfer of Care (Signed)
Immediate Anesthesia Transfer of Care Note  Patient: Carmen Armstrong  Procedure(s) Performed: PLACEMENT OF SPINAL CORD STIMULATOR (Spine Thoracic)  Patient Location: PACU  Anesthesia Type:General  Level of Consciousness: drowsy  Airway & Oxygen Therapy: Patient Spontanous Breathing and Patient connected to face mask oxygen  Post-op Assessment: Report given to RN and Post -op Vital signs reviewed and stable  Post vital signs: Reviewed and stable  Last Vitals:  Vitals Value Taken Time  BP 128/64 05/08/22 1032  Temp    Pulse 67 05/08/22 1034  Resp 15 05/08/22 1034  SpO2 95 % 05/08/22 1034  Vitals shown include unvalidated device data.  Last Pain:  Vitals:   05/08/22 0637  TempSrc:   PainSc: 5       Patients Stated Pain Goal: 2 ([id number])  Complications: No notable events documented.

## 2022-05-08 NOTE — H&P (Signed)
History:  Anne-Louise is a very pleasant 57 year old man who had a successful spinal cord stimulator trial and presents now for definitive implantation.  Past Medical History:  Diagnosis Date   Arrhythmia    Arthritis    Atrial fibrillation (HCC)    CAD (coronary artery disease)    Chronic lower back pain    Depression    Diabetes mellitus without complication (HCC)    Eczema    GAD (generalized anxiety disorder)    GERD (gastroesophageal reflux disease)    H/O vitamin D deficiency    Heart failure (HCC)    Hyperlipidemia    Hypertension    Hypothyroidism    Insomnia    Peripheral neuropathy    PVD (peripheral vascular disease) (HCC)     Allergies  Allergen Reactions   Corylus    Omeprazole     Other reaction(s): Not available   Other     Hazelnuts- SOB   Pear    Penicillin G     whelps   Trulicity [Dulaglutide]     Large itchy bumps    No current facility-administered medications on file prior to encounter.   Current Outpatient Medications on File Prior to Encounter  Medication Sig Dispense Refill   amLODipine (NORVASC) 10 MG tablet Take 1 tablet (10 mg total) by mouth daily. 90 tablet 3   atorvastatin (LIPITOR) 40 MG tablet Take 40 mg by mouth daily.     buPROPion (WELLBUTRIN XL) 300 MG 24 hr tablet Take 300 mg by mouth every morning.     candesartan (ATACAND) 4 MG tablet Take 4 mg by mouth daily.     Dapagliflozin Pro-metFORMIN ER 11-998 MG TB24 Take 1 tablet by mouth 2 (two) times daily.     diphenhydrAMINE (SIMPLY SLEEP) 25 MG tablet Take 25 mg by mouth at bedtime.     FLUoxetine (PROZAC) 20 MG capsule Take 20 mg by mouth daily.     folic acid (FOLVITE) 1 MG tablet Take 1 mg by mouth daily.     gabapentin (NEURONTIN) 300 MG capsule Take 300 mg by mouth daily.     LINZESS 145 MCG CAPS capsule Take 145 mcg by mouth daily.     methimazole (TAPAZOLE) 5 MG tablet Take 1 tablet (5 mg total) by mouth daily. 90 tablet 3   metoprolol succinate (TOPROL-XL) 100 MG  24 hr tablet Take 100 mg by mouth daily.     morphine (MS CONTIN) 15 MG 12 hr tablet Take 15 mg by mouth 3 (three) times daily as needed for pain.     omeprazole (PRILOSEC) 20 MG capsule Take 20 mg by mouth daily.     oxyCODONE-acetaminophen (PERCOCET/ROXICET) 5-325 MG tablet Take 0.5-1 tablets by mouth 2 (two) times daily as needed for moderate pain.     sertraline (ZOLOFT) 100 MG tablet Take 100 mg by mouth 2 (two) times daily.     spironolactone (ALDACTONE) 25 MG tablet TAKE 1 TABLET (25 MG TOTAL) BY MOUTH DAILY. 30 tablet 3   temazepam (RESTORIL) 15 MG capsule Take 15 mg by mouth at bedtime.     TRESIBA FLEXTOUCH 100 UNIT/ML FlexTouch Pen Inject 50 Units into the skin daily.     zolpidem (AMBIEN CR) 6.25 MG CR tablet Take 6.25 mg by mouth at bedtime.     Continuous Blood Gluc Sensor (FREESTYLE LIBRE 2 SENSOR) MISC      ELIQUIS 5 MG TABS tablet Take 5 mg by mouth 2 (two) times daily.  naloxone (NARCAN) nasal spray 4 mg/0.1 mL Place 1 spray into the nose as needed (OD).     OVER THE COUNTER MEDICATION Take 25 mg by mouth at bedtime. Simply Sleep     OZEMPIC, 2 MG/DOSE, 8 MG/3ML SOPN Inject 2 mg into the skin once a week.      Physical Exam: Vitals:   05/08/22 0541  BP: (!) 147/72  Pulse: 70  Resp: 17  Temp: 98.9 F (37.2 C)  SpO2: 98%   Body mass index is 29.57 kg/m. Clinical exam: Aajaylah is a pleasant individual, who appears younger than their stated age.  She is alert and orientated 3.  No shortness of breath, chest pain.  Abdomen is soft and non-tender, negative loss of bowel and bladder control, no rebound tenderness.  Negative: skin lesions abrasions contusions  Peripheral pulses: 2+ peripheral pulses bilaterally. LE compartments are: Soft and nontender.  Gait pattern: Normal  Assistive devices: None  Neuro: Positive dysesthesias and neuropathic pain into the left lower extremity. 5/5 motor strength. Negative straight leg raise test. No clonus, negative  Babinski test.  Musculoskeletal: Significant back pain with range of motion and palpation. Well-healed surgical scar from prior surgery.  Thoracic MRI: completed on 03/30/2022. Disc bulging osteophyte without stenosis T7-8 through T9/10. Moderate to severe facet arthrosis T10-11. No cord signal change. No severe high-grade central stenosis that would prohibit implantation of spinal cord stimulator.  A/P: Summary: Laporchia is a very pleasant 57 year old woman with a prior lumbar spine surgery and unfortunately has chronic pain. She recently had a spinal cord stimulator trial and noted 70% improvement. Given the positive results of the trial she presents to me today to discuss permanent implantation. I have gone over the surgical procedure in great detail and all of her questions were addressed. Surgical plan will be implantation of the spinal cord stimulator. Risks and benefits of surgery were discussed with the patient. These include: Infection, bleeding, death, stroke, paralysis, ongoing or worse pain, need for additional surgery, leak of spinal fluid, Failure of the battery requiring reoperation. Inability to place the paddle requiring the surgery to be aborted. Migration of the lead, failure to obtain results similar to the trial.

## 2022-05-08 NOTE — Discharge Instructions (Addendum)
Spinal Cord Stimulator  Care After  This sheet gives you information about how to care for yourself after your procedure. Your health care provider may also give you more specific instructions. If you have problems or questions, contact your health care provider. What can I expect after the procedure? After the procedure, it is common to have: Soreness or pain. Some swelling in the area where the hardware was removed. A small amount of blood or clear fluid coming from your incision. Follow these instructions at home: If you have a cast: Do not stick anything inside the cast to scratch your skin. Doing that increases your risk of infection. Check the skin around the cast every day. Tell your health care provider about any concerns. You may put lotion on dry skin around the edges of the cast. Do not put lotion on the skin underneath the cast. Keep the cast clean and dry. Do not take baths, swim, or use a hot tub until your health care provider approves. Ask your health care provider if you may take showers. You may only be allowed to take sponge baths. Keep the bandage (dressing) dry until your health care provider says it can be removed. Ok to shower in 5 days.    Incision care  Follow instructions from your health care provider about how to take care of your incision. Make sure you: Wash your hands with soap and water before you change your dressing. If soap and water are not available, use hand sanitizer. Change your dressing as told by your health care provider. Leave stitches (sutures), skin glue, or adhesive strips in place. These skin closures may need to stay in place for 2 weeks or longer. If adhesive strip edges start to loosen and curl up, you may trim the loose edges. Do not remove adhesive strips completely unless your health care provider tells you to do that. Check your incision area every day for signs of infection. Check for: Redness. More swelling or pain. More fluid  or blood. Warmth. Pus or a bad smell. Managing pain, stiffness, and swelling  If directed, put ice on the affected area: Put ice in a plastic bag. Place a towel between your skin and the bag. Leave the ice on for 20 minutes, 2-3 times a day. Move your fingers or toes often to avoid stiffness and to lessen swelling.  Driving Do not drive or use heavy machinery while taking prescription pain medicine. Do not drive for 24 hours if you were given a medicine to help you relax (sedative) during your procedure. Ask your health care provider when it is safe to drive if you have a cast, splint, or boot on the affected limb. Activity Ask your health care provider what activities are safe for you during recovery, and ask what activities you need to avoid. Do not use the injured limb to support your body weight until your health care provider says that you can. Do not play contact sports until your health care provider approves. Do exercises as told by your health care provider. Avoid sitting for a long time without moving. Get up and move around at least every few hours. This will help prevent blood clots. General instructions Do not put pressure on any part of the cast or splint until it is fully hardened. This may take several hours. If you are taking prescription pain medicine, take actions to prevent or treat constipation. Your health care provider may recommend that you: Drink enough fluid to keep  your urine pale yellow. Eat foods that are high in fiber, such as fresh fruits and vegetables, whole grains, and beans. Limit foods that are high in fat and processed sugars, such as fried or sweet foods. Take an over-the-counter or prescription medicine for constipation. Do not use any products that contain nicotine or tobacco, such as cigarettes and e-cigarettes. These can delay bone healing after surgery. If you need help quitting, ask your health care provider. Take over-the-counter and  prescription medicines only as told by your health care provider. Keep all follow-up visits as told by your health care provider. This is important. Contact a health care provider if: You have lasting pain. You have redness around your incision. You have more swelling or pain around your incision. You have more fluid or blood coming from your incision. Your incision feels warm to the touch. You have pus or a bad smell coming from your incision. You are unable to do exercises or physical activity as told by your health care provider. Get help right away if: You have difficulty breathing. You have chest pain. You have severe pain. You have a fever or chills. You have numbness for more than 24 hours in the area where the hardware was removed. Summary After the procedure, it is common to have some pain and swelling in the area where the hardware was removed. Follow instructions from your health care provider about how to take care of your incision. Return to your normal activities as told by your health care provider. Ask your health care provider what activities are safe for you. This information is not intended to replace advice given to you by your health care provider. Make sure you discuss any questions you have with your health care provider.    CONTINUE CURRENT PAIN MANAGEMENT REGIMEN. CONTACT DR. Nelva Bush FOR REFILL OR MODIFICATION OF PAIN REGIMEN RESTART ELIQUIS ON [facility]  05/10/22

## 2022-05-09 DIAGNOSIS — M961 Postlaminectomy syndrome, not elsewhere classified: Secondary | ICD-10-CM | POA: Diagnosis not present

## 2022-05-09 LAB — GLUCOSE, CAPILLARY: Glucose-Capillary: 147 mg/dL — ABNORMAL HIGH (ref 70–99)

## 2022-05-09 NOTE — TOC Transition Note (Signed)
Transition of Care [facility]) - CM/SW Discharge Note   Patient Details  Name: Carmen Armstrong MRN: [id number] Date of Birth: [birth year]  Transition of [facility] CM/SW Contact:  Pollie Friar, RN Phone Number: 05/09/2022, 11:17 AM   Clinical Narrative:    Pt discharging home with home health services through [facility]. Information on the AVS. Pt has transportation home.   Final next level of care: Home w Home Health Services Barriers to Discharge: No Barriers Identified   Patient Goals and CMS Choice      Discharge Placement                         Discharge Plan and Services Additional resources added to the After Visit Summary for                            [facility] Arranged: PT HH Agency: [place] Date [place]: 05/09/22   Representative spoke with at [place]: Amy  Social Determinants of Health ([place]) Interventions SDOH Screenings   Tobacco Use: High Risk (05/08/2022)     Readmission Risk Interventions     No data to display

## 2022-05-09 NOTE — Evaluation (Addendum)
Occupational Therapy Evaluation and Discharge Patient Details Name: Carmen Armstrong MRN: [id number] DOB: January 24, 1966 Today's Date: 05/09/2022   History of Present Illness Pt is a 57 y/o female who presents s/p spincal cord stimulator placement on 05/08/2022. PMH significant for A-fib, CAD, DM, HF, HTN, hypothyroidism, peripheral neuropathy, PVD, x5 prior back surgeries, R THA, R RCR, L big toe amputation.   Clinical Impression   This 57 yo female admitted and underwent above presents to acute OT in severe pain (10/10 per pt) due to not having her normal pain meds from the pain clinic. Feel that once her pain is under control she will be able to do more of her ADLs on her own. Post op back handout was provided by PT and was laying on pt's bedside table. She reports her family can A her prn until her pain is better. Acute OT will sign off.      Recommendations for follow up therapy are one component of a multi-disciplinary discharge planning process, led by the attending physician.  Recommendations may be updated based on patient status, additional functional criteria and insurance authorization.   Assistance Recommended at Discharge Frequent or constant Supervision/Assistance  Patient can return home with the following A lot of help with walking and/or transfers;A lot of help with bathing/dressing/bathroom;Assistance with cooking/housework;Help with stairs or ramp for entrance;Assist for transportation    Functional Status Assessment  Patient has had a recent decline in their functional status and demonstrates the ability to make significant improvements in function in a reasonable and predictable amount of time. (all education completed--once pain better under control pt will be able to do more for herself)  Equipment Recommendations  None recommended by OT       Precautions / Restrictions Precautions Precautions: Fall;Back Precaution Booklet Issued: Yes (comment) Precaution Comments:  Reviewed handout and pt was cued for precautions during functional mobility. Required Braces or Orthoses:  (No brace needed order) Restrictions Weight Bearing Restrictions: No      Mobility Bed Mobility Overal bed mobility: Needs Assistance Bed Mobility: Rolling Rolling: Min guard         General bed mobility comments: increased time to roll from left to right due to pain            ADL either performed or assessed with clinical judgement   ADL                                         General ADL Comments: Mod A-Total A at bed level for getting dressed due to pain--she is able to lift one arm, then the other to A with shirt as well as put shirt over head, total A to pull shirt down as she rolled left and right. Total A for underwear and pants, was able to lift legs to A with donning these 2 pieces of clothing and then slightly bridging to get them pulled up. I did demonstrate to her side step into tub and sit<>stand stance that allows her to keep back mor straight. Pt reports that pta she could cross her legs alternately in a "figure 4" position and put on take off her socks but she cannot now due to pain. I encouraged her each morning and night to try to get her legs crossed so she would know when she was able to do this on her own again and until then family could A  or she could just wear slip on shoes and no socks as well as making UBD easier with just wearing a house dress/long gown.      Vision Patient Visual Report: No change from baseline              Pertinent Vitals/Pain Pain Assessment Pain Assessment: 0-10 Pain Score: 10-Worst pain ever Pain Location: back Pain Descriptors / Indicators: Operative site guarding, Moaning, Sore, Spasm, Grimacing, Guarding Pain Intervention(s): Limited activity within patient's tolerance, Monitored during session, Repositioned     Hand Dominance Right   Extremity/Trunk Assessment Upper Extremity  Assessment Upper Extremity Assessment: Overall WFL for tasks assessed      Communication Communication Communication: No difficulties   Cognition Arousal/Alertness: Awake/alert Behavior During Therapy: WFL for tasks assessed/performed Overall Cognitive Status: Within Functional Limits for tasks assessed                                                  Home Living Family/patient expects to be discharged to:: Private residence Living Arrangements: Other relatives (niece) Available Help at Discharge: Family;Available 24 hours/day Type of Home: House Home Access: Stairs to enter [employer] of Steps: 4 Entrance Stairs-Rails: None Home Layout: One level     Bathroom Shower/Tub: Teacher, early years/pre: Standard     Home Equipment: Financial controller: Reacher        Prior Functioning/Environment Prior Level of Function : Independent/Modified Independent                        OT Problem List: Decreased range of motion;Decreased activity tolerance;Pain         OT Goals(Current goals can be found in the care plan section) Acute Rehab OT Goals Patient Stated Goal: to get her pain meds from pain clinic, be in less pain, go home today.         AM-PAC OT "6 Clicks" Daily Activity     Outcome Measure Help from another person eating meals?: None Help from another person taking care of personal grooming?: A Little Help from another person toileting, which includes using toliet, bedpan, or urinal?: Total (bed level) Help from another person bathing (including washing, rinsing, drying)?: A Lot (bed level) Help from another person to put on and taking off regular upper body clothing?: A Lot (bed level) Help from another person to put on and taking off regular lower body clothing?: Total (bed level) 6 Click Score: 13   End of Session Nurse Communication:  (pt reports she cannot discharge from hospital until  she knows her pain meds have been called in from pain clinic-phone number to [facility] given to RN)  Activity Tolerance: Patient limited by pain Patient left: in bed;with call bell/phone within reach  OT Visit Diagnosis: Other abnormalities of gait and mobility (R26.89);Pain Pain - part of body:  (incisional)                Time: [id number] OT Time Calculation (min): 22 min Charges:  OT General Charges $OT Visit: 1 Visit OT Evaluation $OT Eval Low Complexity: [place]    Almon Register 05/09/2022, 12:12 PM

## 2022-05-09 NOTE — Progress Notes (Signed)
Pt given D/C instructions with verbal understanding. Rx's were given to the Pt at D/C. Pt's incision is clean and dry with no sign of infection. Pt's IV was removed prior to D/C. Pt D/C'd home via wheelchair per MD order. Pt is stable @ D/C and has no other needs at this time. Holli Humbles, RN

## 2022-05-09 NOTE — Evaluation (Signed)
Physical Therapy Evaluation Patient Details Name: Carmen Armstrong MRN: [id number] DOB: February 22, 1965 Today's Date: 05/09/2022  History of Present Illness  Pt is a 57 y/o female who presents s/p spincal cord stimulator placement on 05/08/2022. PMH significant for A-fib, CAD, DM, HF, HTN, hypothyroidism, peripheral neuropathy, PVD, x5 prior back surgeries, R THA, R RCR, L big toe amputation.   Clinical Impression  Pt admitted with above diagnosis. At the time of PT eval, pt was able to demonstrate transfers and ambulation with gross min guard assist to min assist and RW for support. Pt was educated on precautions, brace application/wearing schedule, appropriate activity progression, and car transfer. Pt currently with functional limitations due to the deficits listed below (see PT Problem List). Pt will benefit from skilled PT to increase their independence and safety with mobility to allow discharge to the venue listed below.         Recommendations for follow up therapy are one component of a multi-disciplinary discharge planning process, led by the attending physician.  Recommendations may be updated based on patient status, additional functional criteria and insurance authorization.  Follow Up Recommendations       Assistance Recommended at Discharge Frequent or constant Supervision/Assistance  Patient can return home with the following  A little help with walking and/or transfers;A little help with bathing/dressing/bathroom;Assistance with cooking/housework;Assist for transportation;Help with stairs or ramp for entrance    Equipment Recommendations Rolling walker (2 wheels)  Recommendations for Other Services  OT consult    Functional Status Assessment Patient has had a recent decline in their functional status and demonstrates the ability to make significant improvements in function in a reasonable and predictable amount of time.     Precautions / Restrictions  Precautions Precautions: Fall;Back Precaution Booklet Issued: Yes (comment) Precaution Comments: Reviewed handout and pt was cued for precautions during functional mobility. Required Braces or Orthoses:  (No brace needed order) Restrictions Weight Bearing Restrictions: No      Mobility  Bed Mobility Overal bed mobility: Needs Assistance Bed Mobility: Supine to Sit, Sit to Sidelying, Rolling Rolling: Min guard   Supine to sit: Mod assist   Sit to sidelying: Min assist General bed mobility comments: Pt with poor maintenance of precautions. Pt cued for log roll but not performing to sit EOB. Mod assist provided for trunk elevation. Step-by-step cues provided for return to sidelying and assist required for LE elevation back up into bed.    Transfers Overall transfer level: Needs assistance Equipment used: Rolling walker (2 wheels) Transfers: Sit to/from Stand Sit to Stand: Min assist           General transfer comment: VC's for hand placement on seated surface for safety. Increased time and effort required for pt to initiate sit>stand. Assist for initial power up but pt able to complete.    Ambulation/Gait Ambulation/Gait assistance: Min guard Gait Distance (Feet): 100 Feet Assistive device: Rolling walker (2 wheels) Gait Pattern/deviations: Step-through pattern, Decreased stride length, Trunk flexed Gait velocity: Decreased Gait velocity interpretation: <1.31 ft/sec, indicative of household ambulator   General Gait Details: VC's for improved posture, closer walker proximity, and forward gaze. No assist required but close hands on guarding provided throughout for safety. Pt limited in distance due to pain.  Stairs Stairs: Yes Stairs assistance: Min guard Stair Management: Two rails, Step to pattern, Forwards Number of Stairs: 1 (x4 trials) General stair comments: VC's for sequencing and general safety. 1 stair managed to ascend/descend at a time. Pt reports her stairs at  home are deep and she can fit the walker completely on 1 step before stepping up.  Wheelchair Mobility    Modified Rankin (Stroke Patients Only)       Balance Overall balance assessment: Needs assistance Sitting-balance support: Feet supported, No upper extremity supported Sitting balance-Leahy Scale: Fair     Standing balance support: During functional activity, Reliant on assistive device for balance, Bilateral upper extremity supported Standing balance-Leahy Scale: Poor                               Pertinent Vitals/Pain Pain Assessment Pain Assessment: 0-10 Pain Score: 9  Pain Location: back Pain Descriptors / Indicators: Operative site guarding, Moaning, Sore, Spasm Pain Intervention(s): Monitored during session, Limited activity within patient's tolerance, Repositioned    Home Living Family/patient expects to be discharged to:: Private residence Living Arrangements: Other relatives (Niece) Available Help at Discharge: Family;Available 24 hours/day Type of Home: House Home Access: Stairs to enter Entrance Stairs-Rails: None Entrance Stairs-Number of Steps: 4   Home Layout: One level Home Equipment: Adaptive equipment      Prior Function Prior Level of Function : Independent/Modified Independent                     Hand Dominance        Extremity/Trunk Assessment   Upper Extremity Assessment Upper Extremity Assessment: Defer to OT evaluation    Lower Extremity Assessment Lower Extremity Assessment: Generalized weakness    Cervical / Trunk Assessment Cervical / Trunk Assessment: Back Surgery  Communication      Cognition Arousal/Alertness: Awake/alert Behavior During Therapy: WFL for tasks assessed/performed Overall Cognitive Status: Within Functional Limits for tasks assessed                                          General Comments      Exercises     Assessment/Plan    PT Assessment Patient needs  continued PT services  PT Problem List Decreased strength;Decreased activity tolerance;Decreased balance;Decreased mobility;Decreased coordination;Decreased knowledge of use of DME;Decreased safety awareness;Decreased knowledge of precautions;Pain       PT Treatment Interventions Gait training;DME instruction;Stair training;Functional mobility training;Therapeutic activities;Balance training;Therapeutic exercise;Patient/family education    PT Goals (Current goals can be found in the Care Plan section)  Acute Rehab PT Goals Patient Stated Goal: Be able to return home with family support PT Goal Formulation: With patient Time For Goal Achievement: 05/16/22 Potential to Achieve Goals: Good    Frequency Min 5X/week     Co-evaluation               AM-PAC PT "6 Clicks" Mobility  Outcome Measure Help needed turning from your back to your side while in a flat bed without using bedrails?: A Little Help needed moving from lying on your back to sitting on the side of a flat bed without using bedrails?: A Little Help needed moving to and from a bed to a chair (including a wheelchair)?: A Little Help needed standing up from a chair using your arms (e.g., wheelchair or bedside chair)?: A Little Help needed to walk in hospital room?: A Little Help needed climbing 3-5 steps with a railing? : A Little 6 Click Score: 18    End of Session Equipment Utilized During Treatment: Gait belt Activity Tolerance: Patient limited by pain Patient  left: in bed;with call bell/phone within reach Nurse Communication: Mobility status PT Visit Diagnosis: Unsteadiness on feet (R26.81);Pain Pain - part of body:  (back)    Time: [id number] PT Time Calculation (min) (ACUTE ONLY): 19 min   Charges:   PT Evaluation $PT Eval Low Complexity: [place], PT, DPT Acute Rehabilitation Services Secure Chat Preferred Office: [phone]   Thelma Comp 05/09/2022, 11:08 AM

## 2022-05-09 NOTE — Discharge Summary (Signed)
Patient ID: Carmen Armstrong MRN: [id number] DOB/AGE: [birth year] 58 y.o.  Admit date: 05/08/2022 Discharge date: 05/09/2022  Admission Diagnoses:  Principal Problem:   Chronic pain   Discharge Diagnoses:  Principal Problem:   Chronic pain  status post Procedure(s): PLACEMENT OF SPINAL CORD STIMULATOR  Past Medical History:  Diagnosis Date   Arrhythmia    Arthritis    Atrial fibrillation (HCC)    CAD (coronary artery disease)    Chronic lower back pain    Depression    Diabetes mellitus without complication (HCC)    Eczema    GAD (generalized anxiety disorder)    GERD (gastroesophageal reflux disease)    H/O vitamin D deficiency    Heart failure (HCC)    Hyperlipidemia    Hypertension    Hypothyroidism    Insomnia    Peripheral neuropathy    PVD (peripheral vascular disease) ([place])     Surgeries: Procedure(s): PLACEMENT OF SPINAL CORD STIMULATOR on 05/08/2022   Consultants:   Discharged Condition: Improved  Hospital Course: Carmen Armstrong is an 57 y.o. female who was admitted 05/08/2022 for operative treatment of Chronic pain. Patient failed conservative treatments (please see the history and physical for the specifics) and had severe unremitting pain that affects sleep, daily activities and work/hobbies. After pre-op clearance, the patient was taken to the operating room on 05/08/2022 and underwent  Procedure(s): PLACEMENT OF SPINAL CORD STIMULATOR.    Patient was given perioperative antibiotics:  Anti-infectives (From admission, onward)    Start     Dose/Rate Route Frequency Ordered Stop   05/08/22 1600  ceFAZolin (ANCEF) IVPB 1 g/50 mL premix        1 g 100 mL/hr over 30 Minutes Intravenous Every 8 hours 05/08/22 1119 05/08/22 2359   05/08/22 0719  ceFAZolin (ANCEF) 2-4 GM/100ML-% IVPB       Note to Pharmacy: Gleason, Ginger E: cabinet override      05/08/22 0719 05/08/22 1929        Patient was given sequential compression devices and early  ambulation to prevent DVT.   Patient benefited maximally from hospital stay and there were no complications. At the time of discharge, the patient was urinating/moving their bowels without difficulty, tolerating a regular diet, pain is controlled with oral pain medications and they have been cleared by PT/OT.   Recent vital signs: Patient Vitals for the past 24 hrs:  BP Temp Temp src Pulse Resp SpO2  05/09/22 0550 -- 99.7 F (37.6 C) Oral -- -- --  05/09/22 0310 (!) 141/73 (!) 100.5 F (38.1 C) Oral 79 20 92 %  05/08/22 2337 132/62 99.2 F (37.3 C) Oral 81 18 98 %  05/08/22 1954 127/69 98.5 F (36.9 C) Oral 77 18 100 %  05/08/22 1544 (!) 140/74 98.5 F (36.9 C) Oral 83 20 94 %  05/08/22 1143 112/65 98.7 F (37.1 C) Oral 70 18 95 %  05/08/22 1115 118/63 98.2 F (36.8 C) -- 68 14 93 %  05/08/22 1100 116/62 -- -- 69 15 94 %  05/08/22 1045 123/62 -- -- 67 14 97 %  05/08/22 1034 128/64 98.2 F (36.8 C) -- 67 16 96 %     Recent laboratory studies: No results for input(s): "WBC", "HGB", "HCT", "PLT", "NA", "K", "CL", "CO2", "BUN", "CREATININE", "GLUCOSE", "INR", "CALCIUM" in the last 72 hours.  Invalid input(s): "PT", "2"   Discharge Medications: Please see discharge AVS for specific medications.  Allergies as of 05/09/2022  Reactions   Corylus    Omeprazole    Other reaction(s): Not available   Other    Hazelnuts- SOB   Pear    Penicillin G    whelps   Trulicity [dulaglutide]    Large itchy bumps    Diagnostic Studies: DG THORACOLUMBAR SPINE  Result Date: 05/08/2022 CLINICAL DATA:  Fluoroscopic images from insertion of spinal stimulator. EXAM: THORACOLUMBAR SPINE 1V COMPARISON:  None Available. FINDINGS: Several fluoroscopic intraoperative images from insertion of spinal stimulator. Fluoroscopy time is recorded as 1 minute and 12 seconds. IMPRESSION: Several fluoroscopic intraoperative images from insertion of spinal stimulator. Electronically Signed   By: Fidela Salisbury M.D.   On: 05/08/2022 10:12   DG C-Arm 1-60 Min-No Report  Result Date: 05/08/2022 Fluoroscopy was utilized by the requesting physician.  No radiographic interpretation.   DG C-Arm 1-60 Min-No Report  Result Date: 05/08/2022 Fluoroscopy was utilized by the requesting physician.  No radiographic interpretation.    Discharge Instructions     Incentive spirometry RT   Complete by: As directed         Follow-up Information     Melina Schools, MD. Schedule an appointment as soon as possible for a visit in 2 week(s).   Specialty: Orthopedic Surgery Why: If symptoms worsen, For suture removal, For wound re-check Contact information: [address] [place] [address] [phone]                 Discharge Plan:  discharge to home  Disposition: Patient is status post spinal cord stimulator placement.  Her pain is now controlled with oral medications.  Dressings are clean dry and intact there is no signs of bleeding or infection.  Patient is tolerating a regular diet and voiding spontaneously.  Patient will be discharged to home with appropriate instructions and medications.  Patient will contact her pain management provider today for any additional medications that may be required.  I will defer opioid pain management to her pain management med provider at [facility].  Patient will follow-up with me in 2 weeks for wound check.    Signed: Dahlia Bailiff for Dr. Melina Schools [facility] [phone] 05/09/2022, 7:51 AM

## 2022-05-10 ENCOUNTER — Encounter (HOSPITAL_COMMUNITY): Payer: Self-pay | Admitting: Orthopedic Surgery

## 2022-05-22 ENCOUNTER — Encounter: Payer: Self-pay | Admitting: Podiatry

## 2022-05-22 ENCOUNTER — Ambulatory Visit (INDEPENDENT_AMBULATORY_CARE_PROVIDER_SITE_OTHER): Payer: 59 | Admitting: Podiatry

## 2022-05-22 DIAGNOSIS — D689 Coagulation defect, unspecified: Secondary | ICD-10-CM | POA: Diagnosis not present

## 2022-05-22 DIAGNOSIS — M79675 Pain in left toe(s): Secondary | ICD-10-CM | POA: Diagnosis not present

## 2022-05-22 DIAGNOSIS — E1142 Type 2 diabetes mellitus with diabetic polyneuropathy: Secondary | ICD-10-CM

## 2022-05-22 DIAGNOSIS — B351 Tinea unguium: Secondary | ICD-10-CM

## 2022-05-22 DIAGNOSIS — M79674 Pain in right toe(s): Secondary | ICD-10-CM

## 2022-05-22 NOTE — Progress Notes (Signed)
  Subjective:  Patient ID: Carmen Armstrong, female    DOB: [birth year],   MRN: [id number]  Chief Complaint  Patient presents with   Nail Problem     Routine foot care     57 y.o. female presents for concern of thickened elongated and painful nails that are difficult to trim. Requesting to have them trimmed today. Relates burning and tingling in their feet. Patient is diabetic and last A1c was  Lab Results  Component Value Date   HGBA1C 8.5 (H) 04/25/2022   .   PCP:  Lewis Moccasin, MD    . Denies any other pedal complaints. Denies n/v/f/c.   Past Medical History:  Diagnosis Date   Arrhythmia    Arthritis    Atrial fibrillation (HCC)    CAD (coronary artery disease)    Chronic lower back pain    Depression    Diabetes mellitus without complication (HCC)    Eczema    GAD (generalized anxiety disorder)    GERD (gastroesophageal reflux disease)    H/O vitamin D deficiency    Heart failure (HCC)    Hyperlipidemia    Hypertension    Hypothyroidism    Insomnia    Peripheral neuropathy    PVD (peripheral vascular disease) (HCC)     Objective:  Physical Exam: Vascular: DP/PT pulses 2/4 bilateral. CFT <3 seconds. Absent hair growth on digits. Edema noted to bilateral lower extremities. Xerosis noted bilaterally.  Skin. No lacerations or abrasions bilateral feet. Nails 1-5 bilateral  are thickened discolored and elongated with subungual debris.  Musculoskeletal: MMT 5/5 bilateral lower extremities in DF, PF, Inversion and Eversion. Deceased ROM in DF of ankle joint. 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  Neurological: Sensation intact to light touch. Protective sensation diminished bilateral.    Assessment:   1. Pain due to onychomycosis of toenails of both feet   2. Diabetic polyneuropathy associated with type 2 diabetes mellitus   3. Coagulation defect        Plan:  Patient was evaluated and treated and all questions answered. -Discussed  and educated patient on diabetic foot care, especially with  regards to the vascular, neurological and musculoskeletal systems.  -Stressed the importance of good glycemic control and the detriment of not  controlling glucose levels in relation to the foot. -Discussed supportive shoes at all times and checking feet regularly.  -Mechanically debrided all nails 1-5 bilateral using sterile nail nipper and filed with dremel without incident  -Answered all patient questions -Patient to return  in 3 months for at risk foot care -Patient advised to call the office if any problems or questions arise in the meantime.   Louann Sjogren, DPM

## 2022-06-08 ENCOUNTER — Encounter: Payer: Self-pay | Admitting: Cardiology

## 2022-06-08 ENCOUNTER — Ambulatory Visit: Payer: 59

## 2022-06-08 ENCOUNTER — Ambulatory Visit: Payer: 59 | Admitting: Cardiology

## 2022-06-08 VITALS — BP 145/73 | HR 76 | Ht 71.0 in | Wt 213.0 lb

## 2022-06-08 DIAGNOSIS — I480 Paroxysmal atrial fibrillation: Secondary | ICD-10-CM

## 2022-06-08 DIAGNOSIS — Z7901 Long term (current) use of anticoagulants: Secondary | ICD-10-CM

## 2022-06-08 DIAGNOSIS — I6522 Occlusion and stenosis of left carotid artery: Secondary | ICD-10-CM

## 2022-06-08 DIAGNOSIS — I739 Peripheral vascular disease, unspecified: Secondary | ICD-10-CM

## 2022-06-08 DIAGNOSIS — I2510 Atherosclerotic heart disease of native coronary artery without angina pectoris: Secondary | ICD-10-CM

## 2022-06-08 DIAGNOSIS — I10 Essential (primary) hypertension: Secondary | ICD-10-CM

## 2022-06-08 DIAGNOSIS — R931 Abnormal findings on diagnostic imaging of heart and coronary circulation: Secondary | ICD-10-CM

## 2022-06-08 DIAGNOSIS — F17210 Nicotine dependence, cigarettes, uncomplicated: Secondary | ICD-10-CM

## 2022-06-08 NOTE — Progress Notes (Signed)
Primary Physician/Referring:  Lewis Moccasin, MD  Patient ID: Carmen Armstrong, female    DOB: 1965-04-02, 57 y.o.   MRN: [id number]  Date: 06/08/22 Last Office Visit: 12/07/2021  Chief Complaint  Patient presents with   Coronary Artery Disease   Follow-up   HPI:    Carmen Armstrong  is a 57 y.o. 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  Patient presents today for a 65-month follow-up visit.  In the past she has been evaluated by multiple providers and I am seeing her for the first time.  Reviewed the results of the echocardiogram, coronary CTA, lower extremity arterial duplex results, and carotid duplex.  Since last office visit she denies anginal chest pain or heart failure symptoms.  Unfortunately she continues to smoke 0.5 packs/day and states that has been difficult to quit.  Since last office visit she also had a back stimulator implanted.  She is also on chronic pain medications.  Past Medical History:  Diagnosis Date   Arrhythmia    Arthritis    Atrial fibrillation    CAD (coronary artery disease)    Chronic lower back pain    Depression    Diabetes mellitus without complication    Eczema    GAD (generalized anxiety disorder)    GERD (gastroesophageal reflux disease)    H/O vitamin D deficiency    Heart failure    Hyperlipidemia    Hypertension    Hypothyroidism    Insomnia    Peripheral neuropathy    PVD (peripheral vascular disease)    Past Surgical History:  Procedure Laterality Date   ANGIOPLASTY     legs   BACK SURGERY     5 different times   CARDIAC CATHETERIZATION  2020   REPLACEMENT  TOTAL HIP W/  RESURFACING IMPLANTS Right 2018   RIGHT AND LEFT HEART CATH     ROTATOR CUFF REPAIR Right 2016   SPINAL CORD STIMULATOR INSERTION N/A 05/08/2022   Procedure: PLACEMENT OF SPINAL CORD STIMULATOR;  Surgeon: Venita Lick, MD;  Location: [facility] OR;  Service: Orthopedics;  Laterality: N/A;   SPINE SURGERY  2014,2016,2017,2019   toe amputated  09/2018   5th left toe   TOE AMPUTATION Left    great toe on left side   Family History  Problem Relation Age of Onset   Hypertension Mother    Hyperlipidemia Mother    Diabetes Mother    Stroke Mother    Clotting disorder Father    Hypertension Father    Hyperlipidemia Father    Hyperlipidemia Sister    Diabetes Brother    Neuropathy Brother     Social History   Tobacco Use   Smoking status: Every Day    Packs/day: 0.50    Years: 30.00    Additional pack years: 0.00    Total pack years: 15.00    Types: Cigarettes   Smokeless tobacco: Never  Substance Use Topics   Alcohol use: Not Currently   Marital Status: Widowed   ROS  Review of Systems  Eyes:  Negative for visual disturbance.  Cardiovascular:  Positive for dyspnea on exertion (stbale). Negative for chest pain, claudication, leg swelling, near-syncope, orthopnea, palpitations, paroxysmal nocturnal dyspnea and syncope.  Hematologic/Lymphatic: Does not  bruise/bleed easily.  Musculoskeletal:  Positive for back pain and joint pain.  Gastrointestinal:  Negative for melena.  Neurological:  Positive for paresthesias (Bilateral feet, improving). Negative for dizziness and headaches.    Objective  Blood pressure (!) 145/73, pulse 76, height  (1.803 m), weight 213 lb (96.6 kg), SpO2 96 %.     06/08/2022    1:01 PM 05/09/2022    7:57 AM 05/09/2022    3:10 AM  Vitals with BMI  Height     Weight 213 lbs    BMI 29.72    Systolic 145 100 161  Diastolic 73 78 73  Pulse 76 82 79    Physical Exam  Constitutional: No distress.  Age appropriate, hemodynamically  stable.   Neck: No JVD present.  Cardiovascular: Normal rate, regular rhythm, S1 normal, S2 normal and intact distal pulses. Exam reveals no gallop, no S3 and no S4.  No murmur heard. Pulses:      Carotid pulses are  on the left side with bruit.      Femoral pulses are 2+ on the right side and 2+ on the left side.      Popliteal pulses are 0 on the right side and 0 on the left side.       Dorsalis pedis pulses are 0 on the right side and 0 on the left side.       Posterior tibial pulses are 0 on the right side and 0 on the left side.  Pulmonary/Chest: Effort normal and breath sounds normal. No stridor. She has no wheezes. She has no rales.  Abdominal: Soft. Bowel sounds are normal. She exhibits no distension. There is no abdominal tenderness.  Musculoskeletal:        General: No edema.     Cervical back: Neck supple.  Neurological: She is alert and oriented to person, place, and time. She has intact cranial nerves (2-12).  Skin: Skin is warm and moist.   Laboratory examination:   Recent Labs    03/09/22 1136 04/25/22 1200  NA 138 138  K 4.4 4.8  CL 103 101  CO2 26 26  GLUCOSE 189* 149*  BUN 13 12  CREATININE 0.82 0.89  CALCIUM 9.6 10.3  GFRNONAA  --  >60    CrCl cannot be calculated (Patient's most recent lab result is older than the maximum 21 days allowed.).     Latest Ref Rng & Units 04/25/2022   12:00 PM 03/09/2022   11:36 AM 12/06/2020   11:34 AM  CMP  Glucose 70 - 99 mg/dL 096  045  409   BUN 6 - 20 mg/dL Creatinine 0.44 - 1.00 mg/dL 8.11  9.14  7.82   Sodium 135 - 145 mmol/L 138  138  138   Potassium 3.5 - 5.1 mmol/L 4.8  4.4  4.6   Chloride 98 - 111 mmol/L 101  103  102   CO2 22 - 32 mmol/L Calcium 8.9 - 10.3 mg/dL 95.6  9.6  9.4   Total Protein 6.0 - 8.3 g/dL  7.2    Total Bilirubin 0.2 - 1.2 mg/dL  0.3    Alkaline Phos 39 - 117 U/L  83    AST 0 - 37 U/L  12    ALT 0 - 35 U/L  12        Latest Ref Rng & Units 04/25/2022   12:00  PM 03/09/2022   11:36 AM 09/07/2020   11:31 AM  CBC  WBC 4.0 - 10.5 K/uL 8.7  6.6  5.6   Hemoglobin 12.0 - 15.0 g/dL 16.1  09.6  04.5   Hematocrit 36.0 - 46.0 % 44.9  37.6  40.9   Platelets 150 - 400 K/uL 257  233.0  177.0     Lipid Panel No results for input(s): "CHOL", "TRIG", "LDLCALC", "VLDL", "HDL", "CHOLHDL", "LDLDIRECT" in the last 8760 hours.  HEMOGLOBIN A1C Lab Results  Component Value Date   HGBA1C 8.5 (H) 04/25/2022   MPG 197 04/25/2022   TSH Recent Labs    09/06/21 1107 03/09/22 1136  TSH 5.96* 1.40   External labs:  01/27/2020: Hemoglobin 12.4, hematocrit 39.9, MCV 82, platelets 222 Total cholesterol 83, triglycerides 68, HDL 32, LDL 36 Glucose 92, BUN 23, creatinine 1.07, GFR 59, sodium 141, potassium 4.5, alk phos 178, CMP otherwise normal TSH 0.022  Allergies   Allergies  Allergen Reactions   Corylus    Omeprazole     Other reaction(s): Not available   Other     Hazelnuts- SOB   Pear    Penicillin G     whelps   Trulicity [Dulaglutide]     Large itchy bumps    Medications Prior to Visit:   Outpatient Medications Prior to Visit  Medication Sig Dispense Refill   amLODipine (NORVASC) 10 MG tablet Take 1 tablet (10 mg total) by mouth daily. 90 tablet 3   atorvastatin (LIPITOR) 40 MG tablet Take 40 mg by mouth daily.     buPROPion (WELLBUTRIN XL) 300 MG 24 hr tablet Take 300 mg by mouth every morning.     candesartan (ATACAND) 4 MG tablet Take 4 mg by mouth daily.     Continuous Blood Gluc Sensor (FREESTYLE LIBRE 2 SENSOR) MISC      Dapagliflozin Pro-metFORMIN ER 11-998 MG TB24 Take 1 tablet by mouth 2 (two) times daily.     diphenhydrAMINE (SIMPLY SLEEP) 25 MG tablet Take 25 mg by mouth at bedtime.     ELIQUIS 5 MG TABS tablet Take 5 mg by mouth 2 (two) times daily.     FLUoxetine (PROZAC) 20 MG capsule Take 20 mg by mouth daily.     folic acid (FOLVITE) 1 MG tablet Take 1 mg by mouth daily.     gabapentin (NEURONTIN) 300 MG capsule Take 300 mg  by mouth daily.     LINZESS 145 MCG CAPS capsule Take 145 mcg by mouth daily.     methimazole (TAPAZOLE) 5 MG tablet Take 1 tablet (5 mg total) by mouth daily. 90 tablet 3   metoprolol succinate (TOPROL-XL) 100 MG 24 hr tablet Take 100 mg by mouth daily.     morphine (MS CONTIN) 15 MG 12 hr tablet Take 15 mg by mouth 3 (three) times daily as needed for pain.     naloxone (NARCAN) nasal spray 4 mg/0.1 mL Place 1 spray into the nose as needed (OD).     omeprazole (PRILOSEC) 20 MG capsule Take 20 mg by mouth daily.     oxyCODONE-acetaminophen (PERCOCET/ROXICET) 5-325 MG tablet Take 0.5-1 tablets by mouth 2 (two) times daily as needed for moderate pain.     OZEMPIC, 2 MG/DOSE, 8 MG/3ML SOPN Inject 2 mg into the skin once a week.     sertraline (ZOLOFT) 100 MG tablet Take 100 mg by mouth 2 (two) times daily.     spironolactone (ALDACTONE) 25 MG tablet TAKE 1  TABLET (25 MG TOTAL) BY MOUTH DAILY. 30 tablet 3   temazepam (RESTORIL) 15 MG capsule Take 15 mg by mouth at bedtime.     TRESIBA FLEXTOUCH 100 UNIT/ML FlexTouch Pen Inject 50 Units into the skin daily.     XIIDRA 5 % SOLN Instill ONE drop IN EACH EYE TWICE DAILY     zolpidem (AMBIEN CR) 12.5 MG CR tablet Take 12.5 mg by mouth at bedtime.     ondansetron (ZOFRAN) 4 MG tablet Take 1 tablet (4 mg total) by mouth every 8 (eight) hours as needed for nausea or vomiting. 20 tablet 0   No facility-administered medications prior to visit.   Final Medications at End of Visit    Current Meds  Medication Sig   amLODipine (NORVASC) 10 MG tablet Take 1 tablet (10 mg total) by mouth daily.   atorvastatin (LIPITOR) 40 MG tablet Take 40 mg by mouth daily.   buPROPion (WELLBUTRIN XL) 300 MG 24 hr tablet Take 300 mg by mouth every morning.   candesartan (ATACAND) 4 MG tablet Take 4 mg by mouth daily.   Continuous Blood Gluc Sensor (FREESTYLE LIBRE 2 SENSOR) MISC    Dapagliflozin Pro-metFORMIN ER 11-998 MG TB24 Take 1 tablet by mouth 2 (two) times daily.    diphenhydrAMINE (SIMPLY SLEEP) 25 MG tablet Take 25 mg by mouth at bedtime.   ELIQUIS 5 MG TABS tablet Take 5 mg by mouth 2 (two) times daily.   FLUoxetine (PROZAC) 20 MG capsule Take 20 mg by mouth daily.   folic acid (FOLVITE) 1 MG tablet Take 1 mg by mouth daily.   gabapentin (NEURONTIN) 300 MG capsule Take 300 mg by mouth daily.   LINZESS 145 MCG CAPS capsule Take 145 mcg by mouth daily.   methimazole (TAPAZOLE) 5 MG tablet Take 1 tablet (5 mg total) by mouth daily.   metoprolol succinate (TOPROL-XL) 100 MG 24 hr tablet Take 100 mg by mouth daily.   morphine (MS CONTIN) 15 MG 12 hr tablet Take 15 mg by mouth 3 (three) times daily as needed for pain.   naloxone (NARCAN) nasal spray 4 mg/0.1 mL Place 1 spray into the nose as needed (OD).   omeprazole (PRILOSEC) 20 MG capsule Take 20 mg by mouth daily.   oxyCODONE-acetaminophen (PERCOCET/ROXICET) 5-325 MG tablet Take 0.5-1 tablets by mouth 2 (two) times daily as needed for moderate pain.   OZEMPIC, 2 MG/DOSE, 8 MG/3ML SOPN Inject 2 mg into the skin once a week.   sertraline (ZOLOFT) 100 MG tablet Take 100 mg by mouth 2 (two) times daily.   spironolactone (ALDACTONE) 25 MG tablet TAKE 1 TABLET (25 MG TOTAL) BY MOUTH DAILY.   temazepam (RESTORIL) 15 MG capsule Take 15 mg by mouth at bedtime.   TRESIBA FLEXTOUCH 100 UNIT/ML FlexTouch Pen Inject 50 Units into the skin daily.   XIIDRA 5 % SOLN Instill ONE drop IN EACH EYE TWICE DAILY   zolpidem (AMBIEN CR) 12.5 MG CR tablet Take 12.5 mg by mouth at bedtime.   Radiology:   Coronary CTA 12/08/2020: 1. Total coronary calcium score of 457. This was 99th percentile for age and sex matched control. 2. Normal coronary origin with left dominance. 3. CAD-RADS = 3. Minimal stenosis (0-24%) at the ostial left main due to calcified plaque. Mild stenosis (25-49%) at distal left main/ostial LAD due to calcified plaque. Moderate stenosis (50-69%) at mid LAD due to calcified plaque. Mid to distal LAD is  patent. LCX is patent with diffuse luminal  irregularities no evidence of significant stenosis. Mild stenosis (25-49%) at proximal RCA due to calcified plaque. Mid to distal RCA is patent. 4.  Aortic Atherosclerosis.  Cardiac Studies:   Lower Extremity Arterial Duplex 10/03/2021:  No hemodynamically significant stenoses are identified in the right lower  extremity arterial system. No hemodynamically significant stenoses are  identified in the left lower extremity arterial system.  This exam reveals normal perfusion of the right lower extremity (ABI  1.04).  Mildly abnormal biphasic waveform right PT and severely abnormal  monophasic waveform AT at the right ankle.  This exam reveals normal perfusion of the left lower extremity (ABI 1.08).  Mildly abnormal biphasic waveform at the left ankle.  Compared to ABI 09/05/2019, no significant change.   Carotid artery duplex 10/03/2021:  Duplex suggests stenosis in the right internal carotid artery (minimal).  Heterogeneous plaque.  Duplex suggests stenosis in the left internal carotid artery (50-69%).  Heterogeneous plaque.  Antegrade right vertebral artery flow. Antegrade left vertebral artery  flow.  No significant change from 01/17/2021. Follow up in six months is  appropriate if clinically indicated.   PCV ECHOCARDIOGRAM COMPLETE 12/08/2020 Hyperdynamic LV systolic function with visual EF >70%. Left ventricle cavity is normal in size. Moderate left ventricular hypertrophy. Normal global wall motion. Normal diastolic filling pattern, normal LAP. Mild (Grade I) mitral regurgitation. Mild tricuspid regurgitation. No prior study for comparison.  ABI 09/05/2019:  This exam reveals mildly decreased perfusion of the right lower extremity, noted at the post tibial artery level with monophasic waveform(ABI 0.96).    The right anterior tibial artery shows dampened monophasic waveform suggestive of severe diffuse disease or occlusion.  This exam  reveals mildly decreased perfusion of the left lower extremity, noted at the anterior tibial and post tibial artery level (ABI 0.96).   EKG:   June 08, 2022: Sinus rhythm, 75 bpm, poor R wave progression, consider old inferior infarct.  When compared to 07/12/2021 inferior Q waves are new.  Assessment     ICD-10-CM   1. Coronary artery disease involving native coronary artery of native heart without angina pectoris  I25.10 EKG 12-Lead    PCV ECHOCARDIOGRAM COMPLETE    2. Agatston CAC score, >400  R93.1     3. Asymptomatic bilateral carotid artery stenosis  I65.23 PCV CAROTID DUPLEX (BILATERAL)    4. PVD (peripheral vascular disease)  I73.9 PCV LOWER ARTERIAL (BILATERAL)    5. Paroxysmal atrial fibrillation  I48.0     6. Long term (current) use of anticoagulants  Z79.01     7. Primary hypertension  I10     8. Cigarette smoker  F17.210        Medications Discontinued During This Encounter  Medication Reason   ondansetron (ZOFRAN) 4 MG tablet      No orders of the defined types were placed in this encounter.   Recommendations:   Carmen Armstrong is a 57 y.o.  with hypertension, hyperlipidemia, type 2 DM, PAD, depression. History of ablation for atrial fibrillation in February and April 2021. She had ulcer of her left fifth toe, with worsening gangrene despite revascularization of right and left lower extremities, required amputation.  Coronary artery disease involving native coronary artery of native heart without angina pectoris Agatston CAC score, >400 Total CAC 457, 99th percentile CAD RADS 3 disease Denies anginal chest pain or heart failure symptoms. EKG does not illustrate underlying ischemia or injury pattern. Re emphasized the importance of complete smoking cessation and improving her other modifiable cardiovascular risk  factors. Plan echocardiogram to reevaluate LVEF and diastolic dysfunction.  Last echo was in 2022.  Asymptomatic left carotid artery  stenosis Asymptomatic. Last carotid duplex in August 2023 noted left ICA stenosis less than 70%. Continue pharmacological therapy. Will repeat carotid duplex prior to the next office visit.  PVD (peripheral vascular disease) In the setting of diabetes mellitus type 2, insulin-dependent. 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 She does not have strong popliteal or DP or PT pulses on physical examination. Patient had prior angioplasties in the lower extremities at an outside facility several years ago. Will repeat lower extremity arterial duplex to risk stratify her.  Paroxysmal atrial fibrillation Long term (current) use of anticoagulants Currently on rate control strategy. History of atrial fibrillation ablation x 2. Currently on anticoagulation for thromboembolic prophylaxis. She recently had labs with PCP and states her hemoglobin is stable. Does not endorse evidence of bleeding. Will obtain labs from PCPs office otherwise we will recheck at the next visit  Primary hypertension Office blood pressures within acceptable limits, but not at goal. Currently managed by PCP. Recommend goal SBP 130 mmHg  Cigarette smoker Tobacco cessation counseling: Currently smoking 0.5 packs/day   Patient was informed of the dangers of tobacco abuse including stroke, cancer, and MI, as well as benefits of tobacco cessation. Patient is not willing to quit at this time. 5 mins were spent counseling patient cessation techniques. We discussed various methods to help quit smoking, including deciding on a date to quit, joining a support group, pharmacological agents- nicotine gum/patch/lozenges.  I will reassess her progress at the next follow-up visit  Carmen Armstrong [facility]  Pager:  [phone] Office: [phone]

## 2022-06-29 ENCOUNTER — Ambulatory Visit
Admission: RE | Admit: 2022-06-29 | Discharge: 2022-06-29 | Disposition: A | Discharge status: Home / Self Care | Payer: 59 | Source: Ambulatory Visit | Attending: Family Medicine | Admitting: Family Medicine

## 2022-06-29 DIAGNOSIS — Z1231 Encounter for screening mammogram for malignant neoplasm of breast: Secondary | ICD-10-CM

## 2022-07-03 ENCOUNTER — Other Ambulatory Visit: Payer: Self-pay | Admitting: Family Medicine

## 2022-07-03 DIAGNOSIS — R928 Other abnormal and inconclusive findings on diagnostic imaging of breast: Secondary | ICD-10-CM

## 2022-07-13 ENCOUNTER — Other Ambulatory Visit: Payer: Self-pay | Admitting: Family Medicine

## 2022-07-13 ENCOUNTER — Ambulatory Visit
Admission: RE | Admit: 2022-07-13 | Discharge: 2022-07-13 | Disposition: A | Discharge status: Home / Self Care | Payer: 59 | Source: Ambulatory Visit | Attending: Family Medicine | Admitting: Family Medicine

## 2022-07-13 DIAGNOSIS — R928 Other abnormal and inconclusive findings on diagnostic imaging of breast: Secondary | ICD-10-CM

## 2022-07-13 DIAGNOSIS — R921 Mammographic calcification found on diagnostic imaging of breast: Secondary | ICD-10-CM

## 2022-07-24 ENCOUNTER — Ambulatory Visit
Admission: RE | Admit: 2022-07-24 | Discharge: 2022-07-24 | Disposition: A | Discharge status: Home / Self Care | Payer: 59 | Source: Ambulatory Visit | Attending: Family Medicine | Admitting: Family Medicine

## 2022-07-24 DIAGNOSIS — R921 Mammographic calcification found on diagnostic imaging of breast: Secondary | ICD-10-CM

## 2022-07-24 DIAGNOSIS — R928 Other abnormal and inconclusive findings on diagnostic imaging of breast: Secondary | ICD-10-CM

## 2022-07-24 HISTORY — PX: BREAST BIOPSY: SHX20

## 2022-08-21 ENCOUNTER — Ambulatory Visit: Payer: 59 | Admitting: Podiatry

## 2022-08-21 ENCOUNTER — Encounter: Payer: Self-pay | Admitting: Podiatry

## 2022-08-21 DIAGNOSIS — E1142 Type 2 diabetes mellitus with diabetic polyneuropathy: Secondary | ICD-10-CM | POA: Diagnosis not present

## 2022-08-21 DIAGNOSIS — M79675 Pain in left toe(s): Secondary | ICD-10-CM | POA: Diagnosis not present

## 2022-08-21 DIAGNOSIS — D689 Coagulation defect, unspecified: Secondary | ICD-10-CM | POA: Diagnosis not present

## 2022-08-21 DIAGNOSIS — B351 Tinea unguium: Secondary | ICD-10-CM

## 2022-08-21 DIAGNOSIS — M79674 Pain in right toe(s): Secondary | ICD-10-CM

## 2022-08-21 NOTE — Progress Notes (Signed)
  Subjective:  Patient ID: Carmen Armstrong, female    DOB: [birth year],   MRN: [id number]  Chief Complaint  Patient presents with   Nail Problem     Routine foot care   nail injury    Right great toe nail injury patient states she stubbed toe 2 weeks ago , and states is has been bleeding with pain    57 y.o. female presents for concern of thickened elongated and painful nails that are difficult to trim. Requesting to have them trimmed today. Relates burning and tingling in their feet. Patient is diabetic and last A1c was  Lab Results  Component Value Date   HGBA1C 8.5 (H) 04/25/2022   .   Concern for stubbed right great toe and has been painful and bleeding.   PCP:  Lewis Moccasin, MD    . Denies any other pedal complaints. Denies n/v/f/c.   Past Medical History:  Diagnosis Date   Arrhythmia    Arthritis    Atrial fibrillation (HCC)    CAD (coronary artery disease)    Chronic lower back pain    Depression    Diabetes mellitus without complication (HCC)    Eczema    GAD (generalized anxiety disorder)    GERD (gastroesophageal reflux disease)    H/O vitamin D deficiency    Heart failure (HCC)    Hyperlipidemia    Hypertension    Hypothyroidism    Insomnia    Peripheral neuropathy    PVD (peripheral vascular disease) (HCC)     Objective:  Physical Exam: Vascular: DP/PT pulses 2/4 bilateral. CFT <3 seconds. Absent hair growth on digits. Edema noted to bilateral lower extremities. Xerosis noted bilaterally.  Skin. No lacerations or abrasions bilateral feet. Nails 1-5 bilateral  are thickened discolored and elongated with subungual debris. Small distal abrasion to right great toe. Appears to be healing well Musculoskeletal: MMT 5/5 bilateral lower extremities in DF, PF, Inversion and Eversion. Deceased ROM in DF of ankle joint. 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  Neurological: Sensation intact to light touch. Protective sensation diminished  bilateral.    Assessment:   1. Pain due to onychomycosis of toenails of both feet   2. Diabetic polyneuropathy associated with type 2 diabetes mellitus (HCC)   3. Coagulation defect (HCC)         Plan:  Patient was evaluated and treated and all questions answered. -Discussed and educated patient on diabetic foot care, especially with  regards to the vascular, neurological and musculoskeletal systems.  -Stressed the importance of good glycemic control and the detriment of not  controlling glucose levels in relation to the foot. -Discussed supportive shoes at all times and checking feet regularly.  -Mechanically debrided all nails 1-5 bilateral using sterile nail nipper and filed with dremel without incident  -Continue monitoring right great toe and continue with neosporin/betadine and bandaid until healed. Nearly healed currently -Answered all patient questions -Patient to return  in 3 months for at risk foot care -Patient advised to call the office if any problems or questions arise in the meantime.   Louann Sjogren, DPM

## 2022-08-28 ENCOUNTER — Ambulatory Visit: Payer: 59

## 2022-08-28 DIAGNOSIS — I2510 Atherosclerotic heart disease of native coronary artery without angina pectoris: Secondary | ICD-10-CM

## 2022-08-28 DIAGNOSIS — I6522 Occlusion and stenosis of left carotid artery: Secondary | ICD-10-CM

## 2022-08-28 DIAGNOSIS — I739 Peripheral vascular disease, unspecified: Secondary | ICD-10-CM

## 2022-09-07 ENCOUNTER — Encounter: Payer: Self-pay | Admitting: Cardiology

## 2022-09-07 ENCOUNTER — Ambulatory Visit: Payer: 59 | Admitting: Cardiology

## 2022-09-07 VITALS — BP 141/67 | HR 90 | Resp 16 | Ht 71.0 in | Wt 210.0 lb

## 2022-09-07 DIAGNOSIS — I2510 Atherosclerotic heart disease of native coronary artery without angina pectoris: Secondary | ICD-10-CM

## 2022-09-07 DIAGNOSIS — F17210 Nicotine dependence, cigarettes, uncomplicated: Secondary | ICD-10-CM

## 2022-09-07 DIAGNOSIS — I10 Essential (primary) hypertension: Secondary | ICD-10-CM

## 2022-09-07 DIAGNOSIS — I6523 Occlusion and stenosis of bilateral carotid arteries: Secondary | ICD-10-CM

## 2022-09-07 DIAGNOSIS — Z7901 Long term (current) use of anticoagulants: Secondary | ICD-10-CM

## 2022-09-07 DIAGNOSIS — I480 Paroxysmal atrial fibrillation: Secondary | ICD-10-CM

## 2022-09-07 DIAGNOSIS — I739 Peripheral vascular disease, unspecified: Secondary | ICD-10-CM

## 2022-09-07 DIAGNOSIS — R931 Abnormal findings on diagnostic imaging of heart and coronary circulation: Secondary | ICD-10-CM

## 2022-09-07 MED ORDER — ISOSORBIDE MONONITRATE ER 60 MG PO TB24: 60.0000 mg | ORAL_TABLET | Freq: Every morning | ORAL | 3 refills | Status: DC

## 2022-09-07 NOTE — Progress Notes (Signed)
Primary Physician/Referring:  Lewis Moccasin, MD  Patient ID: Carmen Armstrong, female    DOB: 05/16/1965, 57 y.o.   MRN: [id number]  Date: 09/07/22 Last Office Visit: 06/08/2022  Chief Complaint  Patient presents with   Coronary Artery Disease   PAD   Follow-up    3 months    HPI:    Carmen Armstrong  is a 57 y.o. 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  Patient is being followed by the practice given her history of coronary artery disease, carotid artery atherosclerosis, and atrial fibrillation.  At last office visit the shared decision was to proceed with an echocardiogram to reevaluate her LVEF and structural heart disease.  She also had a carotid duplex to follow-up on the left-sided carotid artery stenosis as well as a lower extremity duplex to reevaluate the progression of PAD.  Clinically she denies anginal chest pain or heart failure symptoms.  Since last office visit she had 1 mechanical injury which led to the bleeding episode she has follow-up with podiatry and continues to be on anticoagulation.  Unfortunately she continues to smoke 0.5 packs/day.  Past Medical History:  Diagnosis Date   Arrhythmia    Arthritis    Atrial fibrillation (HCC)    CAD (coronary artery disease)    Chronic lower back pain    Depression    Diabetes mellitus without complication (HCC)    Eczema    GAD (generalized anxiety disorder)    GERD (gastroesophageal reflux disease)    H/O vitamin D deficiency    Heart failure (HCC)    Hyperlipidemia    Hypertension    Hypothyroidism    Insomnia    Peripheral neuropathy     PVD (peripheral vascular disease) (HCC)    Past Surgical History:  Procedure Laterality Date   ANGIOPLASTY     legs   BACK SURGERY     5 different times   BREAST BIOPSY Right 07/24/2022   MM RT BREAST BX W LOC DEV 1ST LESION IMAGE BX SPEC STEREO GUIDE 07/24/2022 [facility]   CARDIAC CATHETERIZATION  2020   REPLACEMENT TOTAL HIP W/  RESURFACING IMPLANTS Right 2018   RIGHT AND LEFT HEART CATH     ROTATOR CUFF REPAIR Right 2016   SPINAL CORD STIMULATOR INSERTION N/A 05/08/2022   Procedure: PLACEMENT OF SPINAL CORD STIMULATOR;  Surgeon: Venita Lick, MD;  Location: [facility] OR;  Service: Orthopedics;  Laterality: N/A;   SPINE SURGERY  2014,2016,2017,2019   toe amputated  09/2018   5th left toe   TOE AMPUTATION Left    great toe on left side   Family History  Problem Relation Age of Onset   Hypertension Mother    Hyperlipidemia Mother    Diabetes Mother    Stroke Mother    Clotting disorder Father    Hypertension Father    Hyperlipidemia Father    Hyperlipidemia Sister    Diabetes Brother    Neuropathy Brother     Social History   Tobacco Use   Smoking status: Every Day    Current packs/day: 0.50    Average packs/day: 0.5 packs/day for 30.0 years (15.0 ttl pk-yrs)    Types: Cigarettes   Smokeless tobacco: Never  Substance Use  Topics   Alcohol use: Not Currently   Marital Status: Widowed   ROS  Review of Systems  Eyes:  Negative for visual disturbance.  Cardiovascular:  Positive for dyspnea on exertion (stbale). Negative for chest pain, claudication, leg swelling, near-syncope, orthopnea, palpitations, paroxysmal nocturnal dyspnea and syncope.  Hematologic/Lymphatic: Does not bruise/bleed easily.  Musculoskeletal:  Positive for back pain and joint pain.  Gastrointestinal:  Negative for melena.  Neurological:  Positive for paresthesias (Bilateral feet, improving). Negative for dizziness and headaches.    Objective  Blood pressure (!) 141/67, pulse 90, resp. rate  16, height 5\' 11"  (1.803 m), weight 210 lb (95.3 kg), SpO2 95%.     09/07/2022   11:53 AM 06/08/2022    1:01 PM 05/09/2022    7:57 AM  Vitals with BMI  Height 5\' 11"  5\' 11"    Weight 210 lbs 213 lbs   BMI 29.3 29.72   Systolic 141 145 409  Diastolic 67 73 78  Pulse 90 76 82    Physical Exam  Constitutional: No distress.  Age appropriate, hemodynamically stable.   Neck: No JVD present.  Cardiovascular: Normal rate, regular rhythm, S1 normal, S2 normal and intact distal pulses. Exam reveals no gallop, no S3 and no S4.  No murmur heard. Pulses:      Carotid pulses are  on the left side with bruit.      Femoral pulses are 2+ on the right side and 2+ on the left side.      Popliteal pulses are 0 on the right side and 0 on the left side.       Dorsalis pedis pulses are 0 on the right side and 0 on the left side.       Posterior tibial pulses are 0 on the right side and 0 on the left side.  Pulmonary/Chest: Effort normal and breath sounds normal. No stridor. She has no wheezes. She has no rales.  Abdominal: Soft. Bowel sounds are normal. She exhibits no distension. There is no abdominal tenderness.  Musculoskeletal:        General: No edema.     Cervical back: Neck supple.  Neurological: She is alert and oriented to person, place, and time. She has intact cranial nerves (2-12).  Skin: Skin is warm and moist.   Laboratory examination:   Recent Labs    03/09/22 1136 04/25/22 1200  NA 138 138  K 4.4 4.8  CL 103 101  CO2 26 26  GLUCOSE 189* 149*  BUN 13 12  CREATININE 0.82 0.89  CALCIUM 9.6 10.3  GFRNONAA  --  >60    CrCl cannot be calculated (Patient's most recent lab result is older than the maximum 21 days allowed.).     Latest Ref Rng & Units 04/25/2022   12:00 PM 03/09/2022   11:36 AM 12/06/2020   11:34 AM  CMP  Glucose 70 - 99 mg/dL 811  914  782   BUN 6 - 20 mg/dL 12  13  9    Creatinine 0.44 - 1.00 mg/dL 9.56  2.13  0.86   Sodium 135 - 145 mmol/L 138  138  138    Potassium 3.5 - 5.1 mmol/L 4.8  4.4  4.6   Chloride 98 - 111 mmol/L 101  103  102   CO2 22 - 32 mmol/L 26  26  29    Calcium 8.9 - 10.3 mg/dL 57.8  9.6  9.4   Total Protein 6.0 - 8.3 g/dL  7.2  Total Bilirubin 0.2 - 1.2 mg/dL  0.3    Alkaline Phos 39 - 117 U/L  83    AST 0 - 37 U/L  12    ALT 0 - 35 U/L  12        Latest Ref Rng & Units 04/25/2022   12:00 PM 03/09/2022   11:36 AM 09/07/2020   11:31 AM  CBC  WBC 4.0 - 10.5 K/uL 8.7  6.6  5.6   Hemoglobin 12.0 - 15.0 g/dL 40.9  81.1  91.4   Hematocrit 36.0 - 46.0 % 44.9  37.6  40.9   Platelets 150 - 400 K/uL 257  233.0  177.0     Lipid Panel No results for input(s): "CHOL", "TRIG", "LDLCALC", "VLDL", "HDL", "CHOLHDL", "LDLDIRECT" in the last 8760 hours.  HEMOGLOBIN A1C Lab Results  Component Value Date   HGBA1C 8.5 (H) 04/25/2022   MPG 197 04/25/2022   TSH Recent Labs    03/09/22 1136  TSH 1.40   External labs:  01/27/2020: Hemoglobin 12.4, hematocrit 39.9, MCV 82, platelets 222 Total cholesterol 83, triglycerides 68, HDL 32, LDL 36 Glucose 92, BUN 23, creatinine 1.07, GFR 59, sodium 141, potassium 4.5, alk phos 178, CMP otherwise normal TSH 0.022  Allergies   Allergies  Allergen Reactions   Corylus    Omeprazole     Other reaction(s): Not available   Other     Hazelnuts- SOB   Pear    Penicillin G     whelps   Trulicity [Dulaglutide]     Large itchy bumps    Medications Prior to Visit:   Outpatient Medications Prior to Visit  Medication Sig Dispense Refill   amLODipine (NORVASC) 10 MG tablet Take 1 tablet (10 mg total) by mouth daily. 90 tablet 3   atorvastatin (LIPITOR) 40 MG tablet Take 40 mg by mouth daily.     buPROPion (WELLBUTRIN XL) 300 MG 24 hr tablet Take 300 mg by mouth every morning.     candesartan (ATACAND) 4 MG tablet Take 4 mg by mouth daily.     Continuous Blood Gluc Sensor (FREESTYLE LIBRE 2 SENSOR) MISC      Dapagliflozin Pro-metFORMIN ER 11-998 MG TB24 Take 1 tablet by mouth 2  (two) times daily.     diphenhydrAMINE (SIMPLY SLEEP) 25 MG tablet Take 25 mg by mouth at bedtime.     ELIQUIS 5 MG TABS tablet Take 5 mg by mouth 2 (two) times daily.     FLUoxetine (PROZAC) 20 MG capsule Take 20 mg by mouth daily.     gabapentin (NEURONTIN) 300 MG capsule Take 300 mg by mouth daily.     LINZESS 145 MCG CAPS capsule Take 145 mcg by mouth daily.     methimazole (TAPAZOLE) 5 MG tablet Take 1 tablet (5 mg total) by mouth daily. 90 tablet 3   metoprolol succinate (TOPROL-XL) 100 MG 24 hr tablet Take 100 mg by mouth daily.     morphine (MS CONTIN) 15 MG 12 hr tablet Take 15 mg by mouth 3 (three) times daily as needed for pain.     naloxone (NARCAN) nasal spray 4 mg/0.1 mL Place 1 spray into the nose as needed (OD).     omeprazole (PRILOSEC) 20 MG capsule Take 20 mg by mouth daily.     oxyCODONE-acetaminophen (PERCOCET/ROXICET) 5-325 MG tablet Take 0.5-1 tablets by mouth 2 (two) times daily as needed for moderate pain.     OZEMPIC, 2 MG/DOSE, 8 MG/3ML SOPN Inject 2  mg into the skin once a week.     spironolactone (ALDACTONE) 25 MG tablet TAKE 1 TABLET (25 MG TOTAL) BY MOUTH DAILY. 30 tablet 3   TRESIBA FLEXTOUCH 100 UNIT/ML FlexTouch Pen Inject 50 Units into the skin daily.     zolpidem (AMBIEN CR) 12.5 MG CR tablet Take 12.5 mg by mouth at bedtime.     folic acid (FOLVITE) 1 MG tablet Take 1 mg by mouth daily.     sertraline (ZOLOFT) 100 MG tablet Take 100 mg by mouth 2 (two) times daily.     temazepam (RESTORIL) 15 MG capsule Take 15 mg by mouth at bedtime.     XIIDRA 5 % SOLN Instill ONE drop IN [facility] EYE TWICE DAILY     No facility-administered medications prior to visit.   Final Medications at End of Visit    Current Meds  Medication Sig   amLODipine (NORVASC) 10 MG tablet Take 1 tablet (10 mg total) by mouth daily.   atorvastatin (LIPITOR) 40 MG tablet Take 40 mg by mouth daily.   buPROPion (WELLBUTRIN XL) 300 MG 24 hr tablet Take 300 mg by mouth every morning.    candesartan (ATACAND) 4 MG tablet Take 4 mg by mouth daily.   Continuous Blood Gluc Sensor (FREESTYLE LIBRE 2 SENSOR) MISC    Dapagliflozin Pro-metFORMIN ER 11-998 MG TB24 Take 1 tablet by mouth 2 (two) times daily.   diphenhydrAMINE (SIMPLY SLEEP) 25 MG tablet Take 25 mg by mouth at bedtime.   ELIQUIS 5 MG TABS tablet Take 5 mg by mouth 2 (two) times daily.   FLUoxetine (PROZAC) 20 MG capsule Take 20 mg by mouth daily.   gabapentin (NEURONTIN) 300 MG capsule Take 300 mg by mouth daily.   isosorbide mononitrate (IMDUR) 60 MG 24 hr tablet Take 1 tablet (60 mg total) by mouth in the morning.   LINZESS 145 MCG CAPS capsule Take 145 mcg by mouth daily.   methimazole (TAPAZOLE) 5 MG tablet Take 1 tablet (5 mg total) by mouth daily.   metoprolol succinate (TOPROL-XL) 100 MG 24 hr tablet Take 100 mg by mouth daily.   morphine (MS CONTIN) 15 MG 12 hr tablet Take 15 mg by mouth 3 (three) times daily as needed for pain.   naloxone (NARCAN) nasal spray 4 mg/0.1 mL Place 1 spray into the nose as needed (OD).   omeprazole (PRILOSEC) 20 MG capsule Take 20 mg by mouth daily.   oxyCODONE-acetaminophen (PERCOCET/ROXICET) 5-325 MG tablet Take 0.5-1 tablets by mouth 2 (two) times daily as needed for moderate pain.   OZEMPIC, 2 MG/DOSE, 8 MG/3ML SOPN Inject 2 mg into the skin once a week.   spironolactone (ALDACTONE) 25 MG tablet TAKE 1 TABLET (25 MG TOTAL) BY MOUTH DAILY.   TRESIBA FLEXTOUCH 100 UNIT/ML FlexTouch Pen Inject 50 Units into the skin daily.   zolpidem (AMBIEN CR) 12.5 MG CR tablet Take 12.5 mg by mouth at bedtime.   Radiology:   Coronary CTA 12/08/2020: 1. Total coronary calcium score of 457. This was 99th percentile for age and sex matched control. 2. Normal coronary origin with left dominance. 3. CAD-RADS = 3. Minimal stenosis (0-24%) at the ostial left main due to calcified plaque. Mild stenosis (25-49%) at distal left main/ostial LAD due to calcified plaque. Moderate stenosis (50-69%) at  mid LAD due to calcified plaque. Mid to distal LAD is patent. LCX is patent with diffuse luminal irregularities no evidence of significant stenosis. Mild stenosis (25-49%) at proximal RCA due  to calcified plaque. Mid to distal RCA is patent. 4.  Aortic Atherosclerosis.  Cardiac Studies:  ECHOCARDIOGRAM  08/28/2022: Normal LV systolic function with EF 65%. Mild concentric hypertrophy of the left ventricle. Normal global wall motion. Left ventricle cavity is normal in size. Doppler evidence of grade I (impaired) diastolic dysfunction, normal LAP. No significant valvular abnormality. IVC not seen.   Carotid artery duplex 10/03/2021:  Duplex suggests stenosis in the right internal carotid artery (minimal). Heterogeneous plaque.  Duplex suggests stenosis in the left internal carotid artery (50-69%).Heterogeneous plaque.  Antegrade right vertebral artery flow. Antegrade left vertebral artery flow.  No significant change from 01/17/2021. Follow up in six months is appropriate if clinically indicated.   Carotid artery duplex 08/28/2022: Duplex suggests stenosis in the right internal carotid artery (1-15%). Duplex suggests stenosis in the left internal carotid artery (1-15%). Antegrade right vertebral artery flow. Antegrade left vertebral artery flow.   Lower Extremity Arterial Duplex 08/28/2022: No hemodynamically significant stenoses are identified in the right lower extremity arterial system. No hemodynamically significant stenoses are identified in the left lower extremity arterial system. This exam reveals normal perfusion of the right lower extremity (ABI 1.01) with severely abnormal monophasic waveform pattern and suggests small vessel disease.  This exam reveals normal perfusion of the left lower extremity (ABI 1.05) with severely abnormal monophasic waveform pattern left AT and mildly abnormal biphasic waveform pattern at the left PT.   EKG:   June 08, 2022: Sinus rhythm, 75 bpm, poor R  wave progression, consider old inferior infarct.  When compared to 07/12/2021 inferior Q waves are new.  Assessment     ICD-10-CM   1. Coronary artery disease involving native coronary artery of native heart without angina pectoris  I25.10 isosorbide mononitrate (IMDUR) 60 MG 24 hr tablet    2. Agatston CAC score, >400  R93.1 isosorbide mononitrate (IMDUR) 60 MG 24 hr tablet    3. Mild atherosclerosis of both carotid arteries  I65.23     4. PVD (peripheral vascular disease) (HCC)  I73.9     5. Paroxysmal atrial fibrillation (HCC)  I48.0     6. Long term (current) use of anticoagulants  Z79.01     7. Essential hypertension  I10     8. Cigarette smoker  F17.210         Medications Discontinued During This Encounter  Medication Reason   folic acid (FOLVITE) 1 MG tablet    XIIDRA 5 % SOLN    sertraline (ZOLOFT) 100 MG tablet    temazepam (RESTORIL) 15 MG capsule      Meds ordered this encounter  Medications   isosorbide mononitrate (IMDUR) 60 MG 24 hr tablet    Sig: Take 1 tablet (60 mg total) by mouth in the morning.    Dispense:  90 tablet    Refill:  3    Recommendations:   Dare Spillman is a 57 y.o.  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  Coronary artery disease involving native coronary artery of native heart without angina pectoris Agatston CAC score, >400 Total CAC 457, 99th percentile CAD RADS 3 disease Denies anginal chest pain or heart failure symptoms. Prior EKG does not illustrate underlying ischemia or injury pattern. Echo: Preserved LVEF, grade 1 diastolic impairment, no  significant valvular heart disease. Reemphasized the  importance of secondary prevention with focus on improving her modifiable cardiovascular risk factors such as glycemic control, lipid management, blood pressure control, weight loss.  Bilateral carotid artery atherosclerosis asymptomatic. Last carotid duplex in August 2023 noted left ICA stenosis less than 70%. Most recent carotid duplex from July 2024 notes bilateral ICA less than 15% disease Continue dual antiplatelet therapy.  PVD (peripheral vascular disease) In the setting of diabetes mellitus type 2, insulin-dependent. 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 She does not have strong popliteal or DP or PT pulses on physical examination. Patient had prior angioplasties in the lower extremities at an outside facility several years ago. Most recent lower extremity arterial duplex notes no significant stenosis, ABIs are within normal limits, no findings to suggest small vessel disease at the infrapopliteal region.  Paroxysmal atrial fibrillation Long term (current) use of anticoagulants Currently on rate control strategy. History of atrial fibrillation ablation x 2. Currently on anticoagulation for thromboembolic prophylaxis. Click Here to Calculate/Change CHADS2VASc Score The patient's CHADS2-VASc score is 4, indicating a 4.8% annual risk of stroke.   CHF History: No HTN History: Yes Diabetes History: Yes Stroke History: No Vascular Disease History: Yes  Primary hypertension Office blood pressures are currently not at goal. Patient states that the home blood pressure are still between 128-155 mmHg  Will add BiDil 1 tablet 3 times daily  Cigarette smoker Tobacco cessation counseling: Currently smoking 0.5 packs/day   Patient was informed of the dangers of tobacco abuse including stroke, cancer, and MI, as well as benefits of tobacco cessation. Patient is not willing to quit at this time. 5 mins were spent counseling patient cessation techniques. We  discussed various methods to help quit smoking, including deciding on a date to quit, joining a support group, pharmacological agents- nicotine gum/patch/lozenges.  I will reassess her progress at the next follow-up visit  She had labs with PCP today morning.  She is advised to have them send [place] a copy for reference.   Tessa Lerner, [place], [facility]  Pager:  [phone] Office: [phone]

## 2022-09-11 ENCOUNTER — Encounter: Payer: Self-pay | Admitting: Internal Medicine

## 2022-09-11 ENCOUNTER — Ambulatory Visit (INDEPENDENT_AMBULATORY_CARE_PROVIDER_SITE_OTHER): Payer: 59 | Admitting: Internal Medicine

## 2022-09-11 VITALS — BP 122/80 | HR 70 | Ht 71.0 in | Wt 219.0 lb

## 2022-09-11 DIAGNOSIS — E1165 Type 2 diabetes mellitus with hyperglycemia: Secondary | ICD-10-CM

## 2022-09-11 DIAGNOSIS — Z794 Long term (current) use of insulin: Secondary | ICD-10-CM

## 2022-09-11 DIAGNOSIS — E0590 Thyrotoxicosis, unspecified without thyrotoxic crisis or storm: Secondary | ICD-10-CM

## 2022-09-11 DIAGNOSIS — E0500 Thyrotoxicosis with diffuse goiter without thyrotoxic crisis or storm: Secondary | ICD-10-CM

## 2022-09-11 LAB — POCT GLUCOSE (DEVICE FOR HOME USE): POC Glucose: 173 mg/dl — AB (ref 70–99)

## 2022-09-11 MED ORDER — INSULIN PEN NEEDLE 32G X 4 MM MISC: 1.0000 | Freq: Every day | 3 refills | Status: DC

## 2022-09-11 MED ORDER — DAPAGLIFLOZIN PRO-METFORMIN ER 5-1000 MG PO TB24: 2.0000 | ORAL_TABLET | Freq: Every day | ORAL | 3 refills | Status: DC

## 2022-09-11 MED ORDER — TRESIBA FLEXTOUCH 100 UNIT/ML ~~LOC~~ SOPN: 50.0000 [IU] | PEN_INJECTOR | Freq: Every day | SUBCUTANEOUS | 4 refills | Status: DC

## 2022-09-11 MED ORDER — TIRZEPATIDE 5 MG/0.5ML ~~LOC~~ SOAJ: 5.0000 mg | SUBCUTANEOUS | 3 refills | Status: DC

## 2022-09-11 MED ORDER — FREESTYLE LIBRE 2 SENSOR MISC: 1.0000 | 3 refills | Status: DC

## 2022-09-11 NOTE — Patient Instructions (Signed)
Stop Xigduo 11-998 Stop Ozempic 2 mg weekly Start Xigduo 06-998 XR, 2 tabs daily Start Mounjaro 5 mg weekly Continue Tresiba 50 units daily    HOW TO TREAT LOW BLOOD SUGARS (Blood sugar LESS THAN 70 MG/DL) Please follow the RULE OF 15 for the treatment of hypoglycemia treatment (when your (blood sugars are less than 70 mg/dL)   STEP 1: Take 15 grams of carbohydrates when your blood sugar is low, which includes:  3-4 GLUCOSE TABS  OR 3-4 OZ OF JUICE OR REGULAR SODA OR ONE TUBE OF GLUCOSE GEL    STEP 2: RECHECK blood sugar in 15 MINUTES STEP 3: If your blood sugar is still low at the 15 minute recheck --> then, go back to STEP 1 and treat AGAIN with another 15 grams of carbohydrates.

## 2022-09-11 NOTE — Progress Notes (Signed)
Name: Carmen Armstrong  MRN/ DOB: [id number], 1965-09-10    Age/ Sex: 57 y.o., female     PCP: Lewis Moccasin, MD   Reason for Endocrinology Evaluation: Hyperthyroidism     Initial Endocrinology Clinic Visit: 11/05/2019    PATIENT IDENTIFIER: Carmen Armstrong is a 57 y.o., female with a past medical history of HTN, T2DM, PAF , CHF and dyslipidemia. She has followed with [facility] since 11/05/2019 for consultative assistance with management of her Hyperthyroidism.      HISTORICAL SUMMARY:   She was diagnosed with hyperthyroidism in 10/2018 during routine workup . Two months prior to her presentation she was noted with dry eyes. Had occasional double vision, and is under the care of an ophthalmologist.     Pt on Multaq  Since 2020. S/P cardiac ablation  . Has not been on amiodarone since 2021  Thyroid ultrasound did not reveal any thyroid nodules 12/2020 TRAb elevated 30.55 (2021)  Methimazole started 10/2019   Paternal grandmother with thyroid disease     DM HISTORY: She has been diagnosed with DM > 20 yrs ago.    SUBJECTIVE:     Today (09/11/2022):  [name] is here for hyperthyroidism.   Pt would like for [place] to manage  her DM   She is s/p spinal cord stimulator placement 04/2022  Patient continues to follow-up with cardiology for CAD, peripheral artery disease, and A-fib on 08/2022   She checks glucose through freestyle libre, denies hypoglycemia.   Avoids sugar sweetened beverages   Pt continues with weight gain Denies palpitations Has occasional constipation- on linzess every other day  Denies local neck symptoms   Follows with podiatry   Methimazole 5 mg , 1 tabs daily Ozempic 2 mg weekly  Tresiba 50 units daily  Xigduo 11-998    HISTORY:  Past Medical History:  Past Medical History:  Diagnosis Date   Arrhythmia    Arthritis    Atrial fibrillation (HCC)    CAD (coronary artery disease)    Chronic  lower back pain    Depression    Diabetes mellitus without complication (HCC)    Eczema    GAD (generalized anxiety disorder)    GERD (gastroesophageal reflux disease)    H/O vitamin D deficiency    Heart failure (HCC)    Hyperlipidemia    Hypertension    Hypothyroidism    Insomnia    Peripheral neuropathy    PVD (peripheral vascular disease) (HCC)    Past Surgical History:  Past Surgical History:  Procedure Laterality Date   ANGIOPLASTY     legs   BACK SURGERY     5 different times   BREAST BIOPSY Right 07/24/2022   MM RT BREAST BX W LOC DEV 1ST LESION IMAGE BX SPEC STEREO GUIDE 07/24/2022 [facility]   CARDIAC CATHETERIZATION  2020   REPLACEMENT TOTAL HIP W/  RESURFACING IMPLANTS Right 2018   RIGHT AND LEFT HEART CATH     ROTATOR CUFF REPAIR Right 2016   SPINAL CORD STIMULATOR INSERTION N/A 05/08/2022   Procedure: PLACEMENT OF SPINAL CORD STIMULATOR;  Surgeon: Venita Lick, MD;  Location: [facility] OR;  Service: Orthopedics;  Laterality: N/A;   SPINE SURGERY  2014,2016,2017,2019   toe amputated  09/2018   5th left toe   TOE AMPUTATION Left    great toe on left side   Social History:  reports that she has been smoking cigarettes. She has a 15 pack-year smoking history. She has never  used smokeless tobacco. She reports that she does not currently use alcohol. She reports that she does not currently use drugs. Family History:  Family History  Problem Relation Age of Onset   Hypertension Mother    Hyperlipidemia Mother    Diabetes Mother    Stroke Mother    Clotting disorder Father    Hypertension Father    Hyperlipidemia Father    Hyperlipidemia Sister    Diabetes Brother    Neuropathy Brother      HOME MEDICATIONS: Allergies as of 09/11/2022       Reactions   Corylus    Omeprazole    Other reaction(s): Not available   Other    Hazelnuts- SOB   Pear    Penicillin G    whelps   Trulicity [dulaglutide]    Large itchy bumps        Medication List         Accurate as of September 11, 2022 11:27 AM. If you have any questions, ask your nurse or doctor.          STOP taking these medications    Dapagliflozin Pro-metFORMIN ER 11-998 MG Tb24 Replaced by: Dapagliflozin Pro-metFORMIN ER 06-998 MG Tb24 Stopped by: Johnney Ou Taren Toops   Ozempic (2 MG/DOSE) 8 MG/3ML Sopn Generic drug: Semaglutide (2 MG/DOSE) Stopped by: Johnney Ou Nuala Chiles       TAKE these medications    amLODipine 10 MG tablet Commonly known as: NORVASC Take 1 tablet (10 mg total) by mouth daily.   atorvastatin 40 MG tablet Commonly known as: LIPITOR Take 40 mg by mouth daily.   buPROPion 300 MG 24 hr tablet Commonly known as: WELLBUTRIN XL Take 300 mg by mouth every morning.   candesartan 4 MG tablet Commonly known as: ATACAND Take 4 mg by mouth daily.   Dapagliflozin Pro-metFORMIN ER 06-998 MG Tb24 Commonly known as: Xigduo XR Take 2 tablets by mouth daily. Replaces: Dapagliflozin Pro-metFORMIN ER 11-998 MG Tb24 Started by: Johnney Ou Rakim Moone   Eliquis 5 MG Tabs tablet Generic drug: apixaban Take 5 mg by mouth 2 (two) times daily.   FLUoxetine 20 MG capsule Commonly known as: PROZAC Take 20 mg by mouth daily.   FreeStyle Libre 2 Sensor Misc   gabapentin 300 MG capsule Commonly known as: NEURONTIN Take 300 mg by mouth daily.   isosorbide mononitrate 60 MG 24 hr tablet Commonly known as: IMDUR Take 1 tablet (60 mg total) by mouth in the morning.   Linzess 145 MCG Caps capsule Generic drug: linaclotide Take 145 mcg by mouth daily.   methimazole 5 MG tablet Commonly known as: TAPAZOLE Take 1 tablet (5 mg total) by mouth daily.   metoprolol succinate 100 MG 24 hr tablet Commonly known as: TOPROL-XL Take 100 mg by mouth daily.   morphine 15 MG 12 hr tablet Commonly known as: MS CONTIN Take 15 mg by mouth 3 (three) times daily as needed for pain.   naloxone 4 MG/0.1ML Liqd nasal spray kit Commonly known as: NARCAN Place 1 spray  into the nose as needed (OD).   omeprazole 20 MG capsule Commonly known as: PRILOSEC Take 20 mg by mouth daily.   oxyCODONE-acetaminophen 5-325 MG tablet Commonly known as: PERCOCET/ROXICET Take 0.5-1 tablets by mouth 2 (two) times daily as needed for moderate pain.   Simply Sleep 25 MG tablet Generic drug: diphenhydrAMINE Take 25 mg by mouth at bedtime.   spironolactone 25 MG tablet Commonly known as: ALDACTONE TAKE 1 TABLET (25  MG TOTAL) BY MOUTH DAILY.   tirzepatide 5 MG/0.5ML Pen Commonly known as: MOUNJARO Inject 5 mg into the skin once a week. Started by: Scarlette Shorts   Evaristo Bury FlexTouch 100 UNIT/ML FlexTouch Pen Generic drug: insulin degludec Inject 50 Units into the skin daily.   zolpidem 12.5 MG CR tablet Commonly known as: AMBIEN CR Take 12.5 mg by mouth at bedtime.          OBJECTIVE:   PHYSICAL EXAM: VS: BP 122/80 (BP Location: Left Arm, Patient Position: Sitting, Cuff Size: Large)   Pulse 70   Ht 5\' 11"  (1.803 m)   Wt 219 lb (99.3 kg)   SpO2 94%   BMI 30.54 kg/m     EXAM: General: Pt appears well and is in NAD  Eyes: External eye exam normal without stare, lid lag or exophthalmos.    Neck: General: Supple without adenopathy. Thyroid: no nodules appreciated   Lungs: Clear with good BS bilat  Heart: Auscultation: RRR.  Extremities:  BL LE: no  pretibial edema   Mental Status: Judgment, insight: Intact Mood and affect: No depression, anxiety, or agitation     DATA REVIEWED:       Latest Reference Range & Units 03/09/22 11:36  Sodium 135 - 145 mEq/L 138  Potassium 3.5 - 5.1 mEq/L 4.4  Chloride 96 - 112 mEq/L 103  CO2 19 - 32 mEq/L 26  Glucose 70 - 99 mg/dL 161 (H)  BUN 6 - 23 mg/dL 13  Creatinine 0.96 - 0.45 mg/dL 4.09  Calcium 8.4 - 81.1 mg/dL 9.6  Alkaline Phosphatase 39 - 117 U/L 83  Albumin 3.5 - 5.2 g/dL 4.1  AST 0 - 37 U/L 12  ALT 0 - 35 U/L 12  Total Protein 6.0 - 8.3 g/dL 7.2  Total Bilirubin 0.2 - 1.2 mg/dL  0.3  GFR >91.47 mL/min 79.44  WBC 4.0 - 10.5 K/uL 6.6  RBC 3.87 - 5.11 Mil/uL 4.45  Hemoglobin 12.0 - 15.0 g/dL 82.9  HCT 56.2 - 13.0 % 37.6  MCV 78.0 - 100.0 fl 84.7  MCHC 30.0 - 36.0 g/dL 86.5  RDW 78.4 - 69.6 % 15.0  Platelets 150.0 - 400.0 K/uL 233.0      Results for TWALA, OHLIN (MRN [id number]) as of 01/26/2020 13:34  Ref. Range 12/17/2019 10:24  TRAB Latest Ref Range: <=2.00 IU/L 30.55 (H)    Thyroid Ultrasound 01/12/2021  Estimated total number of nodules >/= 1 cm: 0   Number of spongiform nodules >/=  2 cm not described below (TR1): 0   Number of mixed cystic and solid nodules >/= 1.5 cm not described below (TR2): 0   _________________________________________________________   No discrete nodules are seen within the thyroid gland.   IMPRESSION: 1. Borderline enlarged thyroid gland without discrete nodule identified.    ASSESSMENT / PLAN / RECOMMENDATIONS:   Hyperthyroidism Secondary to Graves' Disease:    - Pt is clinically euthyroid  - No local neck symptoms - Tolerating methimazole without side effects  -Per patient she had labs done through PCPs office recently and they were within normal range, these records are not available  Medications   Continue methimazole 5 mg daily   2. Graves' Disease:   -No extrathyroidal manifestation of Graves' disease  3) T2DM, Poorly Controlled : A1c 9.0%   -Patient did not have her freestyle libre on today -Her basal insulin was recently increased by her PCP, no changes at this time to the dose as she has not had  long enough to see a difference -She has intolerance to Trulicity -She is tolerating Ozempic, but given persistent hyperglycemia weight gain on Ozempic, will switch to [facility] as below -I will change the strength of Xigduo, currently she is taking a total of 20 mg of Farxiga, max daily is 10 mg, I will change the strength accordingly   Medication Stop Xigduo 11-998 mg  Stop Ozempic 2 mg  weekly Start Xigduo 06-998 XR, 2 tabs daily Start Mounjaro 5 mg weekly Continue Tresiba 50 units daily   F/U in 3 months     Signed electronically by: Lyndle Herrlich, MD  [facility] Endocrinology  [facility] [address] [place], [place] [address] Phone: [phone] FAX: [phone]      CC: Lewis Moccasin, MD [address] [place] [address] Phone: [phone]  Fax: [phone]   Return to Endocrinology clinic as below: Future Appointments  Date Time Provider Department Center  11/21/2022 10:15 AM Louann Sjogren, DPM [facility]  03/12/2023 11:30 AM Tessa Lerner, DO [facility] None

## 2022-09-20 NOTE — Progress Notes (Signed)
Called patient, NA, LMAM

## 2022-09-22 NOTE — Progress Notes (Signed)
2nd attempt : Called patient, NA, left la results on VM and to call us back if she has any questions/concerns.

## 2022-10-11 ENCOUNTER — Ambulatory Visit (INDEPENDENT_AMBULATORY_CARE_PROVIDER_SITE_OTHER): Payer: 59 | Admitting: Podiatry

## 2022-10-11 ENCOUNTER — Encounter: Payer: Self-pay | Admitting: Podiatry

## 2022-10-11 DIAGNOSIS — L97512 Non-pressure chronic ulcer of other part of right foot with fat layer exposed: Secondary | ICD-10-CM | POA: Diagnosis not present

## 2022-10-11 DIAGNOSIS — E11621 Type 2 diabetes mellitus with foot ulcer: Secondary | ICD-10-CM | POA: Diagnosis not present

## 2022-10-11 NOTE — Progress Notes (Signed)
  Subjective:  Patient ID: Carmen Armstrong, female    DOB: [birth year],   MRN: [id number]  No chief complaint on file.   57 y.o. female presents for concern of drainage and pain in her right great toe. Relates she noticed a few days ago more pain and swelling in the toe and noticed some blood on her sock and came in right away.    PCP:  Lewis Moccasin, MD    . Denies any other pedal complaints. Denies n/v/f/c.   Past Medical History:  Diagnosis Date   Arrhythmia    Arthritis    Atrial fibrillation (HCC)    CAD (coronary artery disease)    Chronic lower back pain    Depression    Diabetes mellitus without complication (HCC)    Eczema    GAD (generalized anxiety disorder)    GERD (gastroesophageal reflux disease)    H/O vitamin D deficiency    Heart failure (HCC)    Hyperlipidemia    Hypertension    Hypothyroidism    Insomnia    Peripheral neuropathy    PVD (peripheral vascular disease) (HCC)     Objective:  Physical Exam: Vascular: DP/PT pulses 2/4 bilateral. CFT <3 seconds. Absent hair growth on digits. Edema noted to bilateral lower extremities. Xerosis noted bilaterally.  Skin. No lacerations or abrasions bilateral feet. Nails 1-5 bilateral  are thickened discolored and elongated with subungual debris. Small ulceration noted to distal right hallux about 0.2 cm x0.2 cm x 0.2 cm with granular base. No erythema edema or purulence noted. No probe to bone.  Musculoskeletal: MMT 5/5 bilateral lower extremities in DF, PF, Inversion and Eversion. Deceased ROM in DF of ankle joint. 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  Neurological: Sensation intact to light touch. Protective sensation diminished bilateral.    Assessment:   1. Diabetic ulcer of toe of right foot associated with type 2 diabetes mellitus, with fat layer exposed (HCC)          Plan:  Patient was evaluated and treated and all questions answered. Ulcer right distal hallux with fat  layer exposed  -Debridement as below. -Dressed with betadine, DSD. -Off-loading with surgical shoe. Dispensed  -No abx indicated.  -Discussed glucose control and proper protein-rich diet.  -Discussed if any worsening redness, pain, fever or chills to call or may need to report to the emergency room. Patient expressed understanding.   Procedure: Excisional Debridement of Wound Rationale: Removal of non-viable soft tissue from the wound to promote healing.  Anesthesia: none Pre-Debridement Wound Measurements: Overlying callus  Post-Debridement Wound Measurements: 0.2 cm x 0.2 cm x 0.2 cm  Type of Debridement: Sharp Excisional Tissue Removed: Non-viable soft tissue Depth of Debridement: subcutaneous tissue. Technique: Sharp excisional debridement to bleeding, viable wound base.  Dressing: Dry, sterile, compression dressing. Disposition: Patient tolerated procedure well. Patient to return in 2 week for follow-up.  Return in about 2 weeks (around 10/25/2022) for wound check.   Louann Sjogren, DPM

## 2022-10-25 ENCOUNTER — Encounter: Payer: Self-pay | Admitting: Podiatry

## 2022-10-25 ENCOUNTER — Ambulatory Visit (INDEPENDENT_AMBULATORY_CARE_PROVIDER_SITE_OTHER): Payer: 59 | Admitting: Podiatry

## 2022-10-25 DIAGNOSIS — E11621 Type 2 diabetes mellitus with foot ulcer: Secondary | ICD-10-CM | POA: Diagnosis not present

## 2022-10-25 DIAGNOSIS — L97511 Non-pressure chronic ulcer of other part of right foot limited to breakdown of skin: Secondary | ICD-10-CM | POA: Diagnosis not present

## 2022-10-25 NOTE — Progress Notes (Signed)
  Subjective:  Patient ID: Carmen Armstrong, female    DOB: 13-May-1965,   MRN: [id number]  No chief complaint on file.   57 y.o. female presents for follow-up of right great toe wound. Relates doing well and has been dressing as instructed. Relates improvement overall.    PCP:  Lewis Moccasin, MD    . Denies any other pedal complaints. Denies n/v/f/c.   Past Medical History:  Diagnosis Date   Arrhythmia    Arthritis    Atrial fibrillation (HCC)    CAD (coronary artery disease)    Chronic lower back pain    Depression    Diabetes mellitus without complication (HCC)    Eczema    GAD (generalized anxiety disorder)    GERD (gastroesophageal reflux disease)    H/O vitamin D deficiency    Heart failure (HCC)    Hyperlipidemia    Hypertension    Hypothyroidism    Insomnia    Peripheral neuropathy    PVD (peripheral vascular disease) (HCC)     Objective:  Physical Exam: Vascular: DP/PT pulses 1/4 bilateral. CFT <3 seconds. Absent hair growth on digits. Edema noted to bilateral lower extremities. Xerosis noted bilaterally.  Skin. No lacerations or abrasions bilateral feet. Nails 1-5 bilateral  are thickened discolored and elongated with subungual debris. Small ulceration noted to distal right hallux about 0.2 cm x0.2 cm x 0.1 cm with granular base. No erythema edema or purulence noted. No probe to bone.  Musculoskeletal: MMT 5/5 bilateral lower extremities in DF, PF, Inversion and Eversion. Deceased ROM in DF of ankle joint. 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  Neurological: Sensation intact to light touch. Protective sensation diminished bilateral.    Assessment:   1. Diabetic ulcer of toe of right foot associated with type 2 diabetes mellitus, with fat layer exposed (HCC)          Plan:  Patient was evaluated and treated and all questions answered. Ulcer right distal hallux limited to breakdown of skin.  -Debridement as below. -Dressed with  betadine, DSD. -Off-loading with surgical shoe. Dispensed  -No abx indicated.  -Discussed glucose control and proper protein-rich diet.  -Discussed if any worsening redness, pain, fever or chills to call or may need to report to the emergency room. Patient expressed understanding.   Procedure: Excisional Debridement of Wound Rationale: Removal of non-viable soft tissue from the wound to promote healing.  Anesthesia: none Pre-Debridement Wound Measurements: Overlying callus  Post-Debridement Wound Measurements: 0.2 cm x 0.2 cm x 0.1 cm  Type of Debridement: Sharp Excisional Tissue Removed: Non-viable soft tissue Depth of Debridement: subcutaneous tissue. Technique: Sharp excisional debridement to bleeding, viable wound base.  Dressing: Dry, sterile, compression dressing. Disposition: Patient tolerated procedure well. Patient to return in 2 week for follow-up.  No follow-ups on file.   Louann Sjogren, DPM

## 2022-11-08 ENCOUNTER — Ambulatory Visit (INDEPENDENT_AMBULATORY_CARE_PROVIDER_SITE_OTHER): Payer: 59 | Admitting: Podiatry

## 2022-11-08 ENCOUNTER — Encounter: Payer: Self-pay | Admitting: Podiatry

## 2022-11-08 DIAGNOSIS — L84 Corns and callosities: Secondary | ICD-10-CM | POA: Diagnosis not present

## 2022-11-08 DIAGNOSIS — M2041 Other hammer toe(s) (acquired), right foot: Secondary | ICD-10-CM

## 2022-11-08 DIAGNOSIS — E1142 Type 2 diabetes mellitus with diabetic polyneuropathy: Secondary | ICD-10-CM

## 2022-11-08 DIAGNOSIS — Z899 Acquired absence of limb, unspecified: Secondary | ICD-10-CM

## 2022-11-08 NOTE — Progress Notes (Signed)
Subjective:  Patient ID: Carmen Armstrong, female    DOB: [birth year],   MRN: [id number]  Chief Complaint  Patient presents with   Wound Check    57 y.o. female presents for follow-up of right great toe wound. Relates doing well and has been dressing as instructed. Relates she believes it is healed.  Relates burning and tingling in their feet. Patient is diabetic and last A1c was  Lab Results  Component Value Date   HGBA1C 8.5 (H) 04/25/2022   .   PCP:  Lewis Moccasin, MD      PCP:  Lewis Moccasin, MD    . Denies any other pedal complaints. Denies n/v/f/c.   Past Medical History:  Diagnosis Date   Arrhythmia    Arthritis    Atrial fibrillation (HCC)    CAD (coronary artery disease)    Chronic lower back pain    Depression    Diabetes mellitus without complication (HCC)    Eczema    GAD (generalized anxiety disorder)    GERD (gastroesophageal reflux disease)    H/O vitamin D deficiency    Heart failure (HCC)    Hyperlipidemia    Hypertension    Hypothyroidism    Insomnia    Peripheral neuropathy    PVD (peripheral vascular disease) (HCC)     Objective:  Physical Exam: Vascular: DP/PT pulses 1/4 bilateral. CFT <3 seconds. Absent hair growth on digits. Edema noted to bilateral lower extremities. Xerosis noted bilaterally.  Skin. No lacerations or abrasions bilateral feet. Nails 1-5 bilateral  are thickened discolored and elongated with subungual debris. Small ulceration noted to distal right hallux is healed.  Musculoskeletal: MMT 5/5 bilateral lower extremities in DF, PF, Inversion and Eversion. Deceased ROM in DF of ankle joint. 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 Hammered digits 1-5 bilateral.  Neurological: Sensation intact to light touch. Protective sensation diminished bilateral.    Assessment:   1. Pre-ulcerative calluses   2. Diabetic polyneuropathy associated with type 2 diabetes mellitus (HCC)           Plan:   Patient was evaluated and treated and all questions answered. Ulcer right distal hallux-healed.  -Debridement of hyperkeratotic tissue with undelrying ulceration healed.  -Dressed with betadine, DSD. -Off-loading with surgical shoe. Discussed getting fitted for DM shoes and will start this process.   -No abx indicated.  -Discussed glucose control and proper protein-rich diet.  -Discussed if any worsening redness, pain, fever or chills to call or may need to report to the emergency room. Patient expressed understanding.   Return in 4 weeks for recheck.   No follow-ups on file.   Louann Sjogren, DPM

## 2022-11-21 ENCOUNTER — Ambulatory Visit: Payer: 59 | Admitting: Podiatry

## 2022-12-03 ENCOUNTER — Encounter (HOSPITAL_COMMUNITY): Payer: Self-pay

## 2022-12-03 ENCOUNTER — Other Ambulatory Visit: Payer: Self-pay

## 2022-12-03 ENCOUNTER — Emergency Department (HOSPITAL_COMMUNITY)
Admission: EM | Admit: 2022-12-03 | Discharge: 2022-12-03 | Disposition: A | Discharge status: Home / Self Care | Payer: 59 | Attending: Emergency Medicine | Admitting: Emergency Medicine

## 2022-12-03 ENCOUNTER — Emergency Department (HOSPITAL_COMMUNITY): Payer: 59

## 2022-12-03 DIAGNOSIS — I509 Heart failure, unspecified: Secondary | ICD-10-CM | POA: Diagnosis not present

## 2022-12-03 DIAGNOSIS — Z79899 Other long term (current) drug therapy: Secondary | ICD-10-CM | POA: Diagnosis not present

## 2022-12-03 DIAGNOSIS — I2510 Atherosclerotic heart disease of native coronary artery without angina pectoris: Secondary | ICD-10-CM | POA: Insufficient documentation

## 2022-12-03 DIAGNOSIS — M25551 Pain in right hip: Secondary | ICD-10-CM | POA: Insufficient documentation

## 2022-12-03 DIAGNOSIS — E119 Type 2 diabetes mellitus without complications: Secondary | ICD-10-CM | POA: Diagnosis not present

## 2022-12-03 DIAGNOSIS — E039 Hypothyroidism, unspecified: Secondary | ICD-10-CM | POA: Diagnosis not present

## 2022-12-03 DIAGNOSIS — I110 Hypertensive heart disease with heart failure: Secondary | ICD-10-CM | POA: Diagnosis not present

## 2022-12-03 DIAGNOSIS — Z96641 Presence of right artificial hip joint: Secondary | ICD-10-CM | POA: Insufficient documentation

## 2022-12-03 DIAGNOSIS — Z7984 Long term (current) use of oral hypoglycemic drugs: Secondary | ICD-10-CM | POA: Diagnosis not present

## 2022-12-03 LAB — URINALYSIS, ROUTINE W REFLEX MICROSCOPIC
Bacteria, UA: NONE SEEN
Bilirubin Urine: NEGATIVE
Glucose, UA: >=500 mg/dL — AB
Hgb urine dipstick: NEGATIVE
Ketones, ur: NEGATIVE mg/dL
Leukocytes,Ua: NEGATIVE
Nitrite: NEGATIVE
Protein, ur: NEGATIVE mg/dL
Specific Gravity, Urine: 1.025 (ref 1.005–1.030)
pH: 6 (ref 5.0–8.0)

## 2022-12-03 MED ORDER — KETOROLAC TROMETHAMINE 15 MG/ML IJ SOLN
15.0000 mg | Freq: Once | INTRAMUSCULAR | Status: DC
  Filled 2022-12-03: qty 1

## 2022-12-03 MED ORDER — OXYCODONE-ACETAMINOPHEN 5-325 MG PO TABS: 1.0000 | ORAL_TABLET | Freq: Once | ORAL | Status: DC

## 2022-12-03 MED ORDER — HYDROMORPHONE HCL 1 MG/ML IJ SOLN: 1.0000 mg | Freq: Once | INTRAMUSCULAR | Status: DC

## 2022-12-03 MED ORDER — OXYCODONE-ACETAMINOPHEN 5-325 MG PO TABS
1.0000 | ORAL_TABLET | Freq: Once | ORAL | Status: AC
  Administered 2022-12-03: 1 via ORAL
  Filled 2022-12-03: qty 1

## 2022-12-03 MED ORDER — CYCLOBENZAPRINE HCL 10 MG PO TABS
5.0000 mg | ORAL_TABLET | Freq: Once | ORAL | Status: AC
  Administered 2022-12-03: 5 mg via ORAL
  Filled 2022-12-03: qty 1

## 2022-12-03 MED ORDER — LIDOCAINE 5 % EX PTCH
1.0000 | MEDICATED_PATCH | CUTANEOUS | Status: DC
  Administered 2022-12-03: 1 via TRANSDERMAL
  Filled 2022-12-03: qty 1

## 2022-12-03 MED ORDER — KETOROLAC TROMETHAMINE 60 MG/2ML IM SOLN
30.0000 mg | Freq: Once | INTRAMUSCULAR | Status: AC
  Administered 2022-12-03: 30 mg via INTRAMUSCULAR
  Filled 2022-12-03: qty 2

## 2022-12-03 NOTE — ED Provider Notes (Signed)
Accepted handoff at shift change from Jeanelle Malling PA-C. Please see prior provider note for more detail.   Briefly: Patient is 57 y.o. "presents to the emergency department for evaluation of hip pain. Pain started yesterday, worse with movement. She denies any recent fall or direct trauma to the hip. Denies any fever, nausea, vomiting, chest pain, shortness of breath, abdominal pain, urinary symptoms, blood in the stool or urine. She is on OxyContin and pain stimulator."  DDX: concern for dislocation, fracture, occult fracture, musculoskeletal pain, radiculopathy   Plan:  - total hip replacement years ago. Pain occurred without trauma/inciting event. Physical exam reassuring. States that she follows closely with ortho and pain management outpatient for her chronic pain.  - patient with severe pain with walking. Ordered CT scan which did not show any acute abnormalities. Patient stating that she is able to call her ortho and pain management doctors in the morning for an urgent appointment. States that she just wants to lay in bed rest of night since pain is resolved with rest. Provided patient with another dose of pain medication. Offered walker but patient refused stating that she has a walker at home. Answered patient questions.  - UA not concerning for infection.  - Patient afebrile with stable vitals. Provided with return precautions. Discharged in good condition.   Dorthy Cooler, [place] 12/03/22 2101    Pricilla Loveless, MD 12/07/22 [phone]

## 2022-12-03 NOTE — Discharge Instructions (Signed)
It was a pleasure caring for you today.  As discussed, please follow-up with your primary care provider and pain management doctor tomorrow.  Seek emergency care if experiencing any new or worsening symptoms.

## 2022-12-03 NOTE — ED Notes (Signed)
Pt declined ice pack. Pt states she tried ice at home with no relief.

## 2022-12-03 NOTE — ED Provider Notes (Signed)
[place] EMERGENCY DEPARTMENT AT [facility] Provider Note   CSN: [id number] Arrival date & time: 12/03/22  1055     History  No chief complaint on file.   Carmen Armstrong is a 57 y.o. female with a past medical history significant for total right hip replacement presents to the emergency department for evaluation of hip pain.  Pain started yesterday, worse with movement.  She denies any recent fall or direct trauma to the hip.  Denies any fever, nausea, vomiting, chest pain, shortness of breath, abdominal pain, urinary symptoms, blood in the stool or urine.  She is on OxyContin and pain stimulator.  HPI  Past Medical History:  Diagnosis Date   Arrhythmia    Arthritis    Atrial fibrillation (HCC)    CAD (coronary artery disease)    Chronic lower back pain    Depression    Diabetes mellitus without complication (HCC)    Eczema    GAD (generalized anxiety disorder)    GERD (gastroesophageal reflux disease)    H/O vitamin D deficiency    Heart failure (HCC)    Hyperlipidemia    Hypertension    Hypothyroidism    Insomnia    Peripheral neuropathy    PVD (peripheral vascular disease) (HCC)    Past Surgical History:  Procedure Laterality Date   ANGIOPLASTY     legs   BACK SURGERY     5 different times   BREAST BIOPSY Right 07/24/2022   MM RT BREAST BX W LOC DEV 1ST LESION IMAGE BX SPEC STEREO GUIDE 07/24/2022 [facility]   CARDIAC CATHETERIZATION  2020   REPLACEMENT TOTAL HIP W/  RESURFACING IMPLANTS Right 2018   RIGHT AND LEFT HEART CATH     ROTATOR CUFF REPAIR Right 2016   SPINAL CORD STIMULATOR INSERTION N/A 05/08/2022   Procedure: PLACEMENT OF SPINAL CORD STIMULATOR;  Surgeon: Venita Lick, MD;  Location: [facility] OR;  Service: Orthopedics;  Laterality: N/A;   SPINE SURGERY  2014,2016,2017,2019   toe amputated  09/2018   5th left toe   TOE AMPUTATION Left    great toe on left side     Home Medications Prior to Admission medications    Medication Sig Start Date End Date Taking? Authorizing Provider  amLODipine (NORVASC) 10 MG tablet Take 1 tablet (10 mg total) by mouth daily. 07/12/21 09/07/22  Cantwell, Celeste C, PA-C  atorvastatin (LIPITOR) 40 MG tablet Take 40 mg by mouth daily. 08/08/19   [provider]  buPROPion (WELLBUTRIN XL) 300 MG 24 hr tablet Take 300 mg by mouth every morning. 04/03/22   [provider]  candesartan (ATACAND) 4 MG tablet Take 4 mg by mouth daily. 08/13/19   [provider]  Continuous Glucose Sensor (FREESTYLE LIBRE 2 SENSOR) MISC 1 Device by Other route every 14 (fourteen) days. 09/11/22   Shamleffer, Konrad Dolores, MD  Dapagliflozin Pro-metFORMIN ER (XIGDUO XR) 06-998 MG TB24 Take 2 tablets by mouth daily. 09/11/22   Shamleffer, Konrad Dolores, MD  diphenhydrAMINE (SIMPLY SLEEP) 25 MG tablet Take 25 mg by mouth at bedtime.    [provider]  ELIQUIS 5 MG TABS tablet Take 5 mg by mouth 2 (two) times daily. 08/08/19   [provider]  FLUoxetine (PROZAC) 20 MG capsule Take 20 mg by mouth daily. 04/04/22   [provider]  gabapentin (NEURONTIN) 300 MG capsule Take 300 mg by mouth daily. 12/07/21   [provider]  insulin degludec (TRESIBA FLEXTOUCH) 100 UNIT/ML FlexTouch  Pen Inject 50 Units into the skin daily. 09/11/22   Shamleffer, Konrad Dolores, MD  Insulin Pen Needle 32G X 4 MM MISC 1 Device by Does not apply route daily in the afternoon. 09/11/22   Shamleffer, Konrad Dolores, MD  isosorbide mononitrate (IMDUR) 60 MG 24 hr tablet Take 1 tablet (60 mg total) by mouth in the morning. 09/07/22 12/06/22  Tolia, Sunit, DO  LINZESS 145 MCG CAPS capsule Take 145 mcg by mouth daily. 04/03/22   [provider]  methimazole (TAPAZOLE) 5 MG tablet Take 1 tablet (5 mg total) by mouth daily. 03/10/22   Shamleffer, Konrad Dolores, MD  metoprolol succinate (TOPROL-XL) 100 MG 24 hr tablet Take 100 mg by mouth daily. 06/12/19   [provider]  morphine (MS CONTIN) 15 MG 12 hr tablet Take 15 mg by mouth 3 (three) times daily as needed for pain. 04/06/22   [provider]  naloxone [facility]) nasal spray 4 mg/0.1 mL Place 1 spray into the nose as needed (OD).    [provider]  omeprazole (PRILOSEC) 20 MG capsule Take 20 mg by mouth daily. 06/15/21   [provider]  oxyCODONE-acetaminophen (PERCOCET/ROXICET) 5-325 MG tablet Take 0.5-1 tablets by mouth 2 (two) times daily as needed for moderate pain. 04/06/22   [provider]  spironolactone (ALDACTONE) 25 MG tablet TAKE 1 TABLET (25 MG TOTAL) BY MOUTH DAILY. 04/25/21   Cantwell, Celeste C, PA-C  tirzepatide [facility]) 5 MG/0.5ML Pen Inject 5 mg into the skin once a week. 09/11/22   Shamleffer, Konrad Dolores, MD  zolpidem (AMBIEN CR) 12.5 MG CR tablet Take 12.5 mg by mouth at bedtime.    [provider]      Allergies    Corylus, Omeprazole, Other, Pear, Penicillin g, and Trulicity [dulaglutide]    Review of Systems   Review of Systems Negative except as per HPI.  Physical Exam Updated Vital Signs BP 131/80   Pulse 63   Temp 98.4 F (36.9 C)   Resp 18   SpO2 99%  Physical Exam Vitals and nursing note reviewed.  Constitutional:      Appearance: Normal appearance.  HENT:     Head: Normocephalic and atraumatic.     Mouth/Throat:     Mouth: Mucous membranes are moist.  Eyes:     General: No scleral icterus. Cardiovascular:     Rate and Rhythm: Normal rate and regular rhythm.     Pulses: Normal pulses.     Heart sounds: Normal heart sounds.  Pulmonary:     Effort: Pulmonary effort is normal.     Breath sounds: Normal breath sounds.  Abdominal:     General: Abdomen is flat.     Palpations: Abdomen is soft.     Tenderness: There is no abdominal tenderness.  Musculoskeletal:        General: No deformity.     Comments: Tenderness to palpation to the right hip with limited range of motion secondary to pain.   Skin:    General: Skin is warm.     Findings: No rash.  Neurological:     General: No focal deficit present.     Mental Status: She is alert.  Psychiatric:        Mood and Affect: Mood normal.     ED Results / Procedures / Treatments   Labs (all labs ordered are listed, but only abnormal results are displayed) Labs Reviewed  URINALYSIS, ROUTINE W REFLEX MICROSCOPIC - Abnormal; Notable for the  following components:      Result Value   Glucose, UA >=500 (*)    All other components within normal limits    EKG None  Radiology DG Hip Unilat W or Wo Pelvis 2-3 Views Right  Result Date: 12/03/2022 CLINICAL DATA:  Right hip pain.  Difficulty walking. EXAM: DG HIP (WITH OR WITHOUT PELVIS) 2-3V RIGHT COMPARISON:  None Available. FINDINGS: Pelvis is intact with normal and symmetric sacroiliac joints. No acute fracture or dislocation. No aggressive osseous lesion. Visualized sacral arcuate lines are unremarkable. Unremarkable symphysis pubis. There are mild degenerative changes of the left hip joint with mild-to-moderate joint space narrowing. Patient is status post right total hip arthroplasty the hardware is intact. No periprosthetic fracture or lucency. There is near anatomic alignment. Neurostimulator device with its battery pack overlying the left iliac wing region and lumbosacral spinal fixation hardware seen. No radiopaque foreign bodies. IMPRESSION: 1. 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 2. Status post right total hip arthroplasty. Electronically Signed   By: Jules Schick M.D.   On: 12/03/2022 13:58    Procedures Procedures    Medications Ordered in ED Medications  ketorolac (TORADOL) 15 MG/ML injection 15 mg (has no administration in time range)  oxyCODONE-acetaminophen (PERCOCET/ROXICET) 5-325 MG per tablet 1 tablet (has no administration in time range)  lidocaine (LIDODERM) 5 % 1 patch (has no administration in time range)  cyclobenzaprine (FLEXERIL) tablet  5 mg (5 mg Oral Given 12/03/22 1339)    ED Course/ Medical Decision Making/ A&P                                 Medical Decision Making Risk Prescription drug management.   This patient presents to the ED for hip pain, this involves an extensive number of treatment options, and is a complaint that carries with a high risk of complications and morbidity.  The differential diagnosis includes dislocation, fracture, occult fracture, musculoskeletal pain, radiculopathy.  This is not an exhaustive list.  Lab tests: I ordered and personally interpreted labs.  The pertinent results include: UA is unremarkable.  Imaging studies: I ordered imaging studies, personally reviewed, interpreted imaging and agree with the radiologist's interpretations. The results include: X-ray of right hip with no acute finding.  Problem list/ ED course/ Critical interventions/ Medical management: HPI: See above Vital signs within normal range and stable throughout visit. Laboratory/imaging studies significant for: See above. On physical examination, patient is afebrile and appears in no acute distress.  There was tenderness to palpation to the right hip.  Right hip with limited range of motion secondary to pain.  X-ray of the right hip showed no acute findings.  Given Percocet, Toradol, Flexeril for pain.  Will reassess patient after these pain medications. I have reviewed the patient home medicines and have made adjustments as needed.  Cardiac monitoring/EKG: The patient was maintained on a cardiac monitor.  I personally reviewed and interpreted the cardiac monitor which showed an underlying rhythm of: sinus rhythm.  Additional history obtained: External records from outside source obtained and reviewed including: Chart review including previous notes, labs, imaging.  Consultations obtained:  Disposition Patient care signed out at shift change to [facility] with pending pain control.  This chart was  dictated using voice recognition software.  Despite best efforts to proofread,  errors can occur which can change the documentation meaning.          Final Clinical  Impression(s) / ED Diagnoses Final diagnoses:  Right hip pain    Rx / DC Orders ED Discharge Orders     None         Jeanelle Malling, [place] 12/03/22 1508    Pricilla Loveless, MD 12/07/22 [phone]

## 2022-12-03 NOTE — ED Notes (Signed)
Awaiting patient from lobby 

## 2022-12-03 NOTE — ED Triage Notes (Signed)
Patient complains of right lower back and right hip pain with any ROM x 1 day. Denies injury but reports awoke with severe pain and no relief with her narcotic pain meds

## 2022-12-03 NOTE — ED Notes (Signed)
Pt in NAD at d/c from ED. A&O. Ambulatory with cane. Respirations even & unlabored. Skin warm & dry. Pt verbalized understanding of d/c teaching including follow up care, medications and reasons to return to the ED. No needs expressed at d/c.

## 2022-12-03 NOTE — ED Provider Triage Note (Signed)
Emergency Medicine Provider Triage Evaluation Note  Carmen Armstrong , a 57 y.o. female  was evaluated in triage.  Pt complains of R hip pain that is sharp and stabbing and reduces mobility. Hx of hip replacement. On OxyContin and pain stimulator.   Review of Systems  Positive: Hip pain Negative: fevers  Physical Exam  There were no vitals taken for this visit. Gen:   Awake, no distress   Resp:  Normal effort  MSK:   Moves extremities without difficulty  Other:  Pain worse with lowering R leg, no crepitus or rash to R back  Medical Decision Making  Medically screening exam initiated at 11:39 AM.  Appropriate orders placed.  Carmen Armstrong was informed that the remainder of the evaluation will be completed by another provider, this initial triage assessment does not replace that evaluation, and the importance of remaining in the ED until their evaluation is complete.     Pete Pelt, [place] 12/03/22 1141

## 2022-12-06 ENCOUNTER — Encounter: Payer: Self-pay | Admitting: Podiatry

## 2022-12-06 ENCOUNTER — Ambulatory Visit (INDEPENDENT_AMBULATORY_CARE_PROVIDER_SITE_OTHER): Payer: 59 | Admitting: Podiatry

## 2022-12-06 ENCOUNTER — Telehealth: Payer: Self-pay | Admitting: Cardiology

## 2022-12-06 DIAGNOSIS — L84 Corns and callosities: Secondary | ICD-10-CM

## 2022-12-06 DIAGNOSIS — M79675 Pain in left toe(s): Secondary | ICD-10-CM | POA: Diagnosis not present

## 2022-12-06 DIAGNOSIS — E1142 Type 2 diabetes mellitus with diabetic polyneuropathy: Secondary | ICD-10-CM

## 2022-12-06 DIAGNOSIS — B351 Tinea unguium: Secondary | ICD-10-CM

## 2022-12-06 DIAGNOSIS — M79674 Pain in right toe(s): Secondary | ICD-10-CM | POA: Diagnosis not present

## 2022-12-06 NOTE — Progress Notes (Signed)
  Subjective:  Patient ID: Carmen Armstrong, female    DOB: Jun 20, 1965,   MRN: [id number]  No chief complaint on file.   57 y.o. female presents for follow-up of right great toe wound. Relates doing well and has been dressing as instructed. Relates she believes it is healed. concern of thickened elongated and painful nails that are difficult to trim. Requesting to have them trimmed today. Relates burning and tingling in their feet. Patient is diabetic and last A1c was  Lab Results  Component Value Date   HGBA1C 8.5 (H) 04/25/2022   .    PCP:  Lewis Moccasin, MD    . Denies any other pedal complaints. Denies n/v/f/c.   Past Medical History:  Diagnosis Date   Arrhythmia    Arthritis    Atrial fibrillation (HCC)    CAD (coronary artery disease)    Chronic lower back pain    Depression    Diabetes mellitus without complication (HCC)    Eczema    GAD (generalized anxiety disorder)    GERD (gastroesophageal reflux disease)    H/O vitamin D deficiency    Heart failure (HCC)    Hyperlipidemia    Hypertension    Hypothyroidism    Insomnia    Peripheral neuropathy    PVD (peripheral vascular disease) (HCC)     Objective:  Physical Exam: Vascular: DP/PT pulses 1/4 bilateral. CFT <3 seconds. Absent hair growth on digits. Edema noted to bilateral lower extremities. Xerosis noted bilaterally.  Skin. No lacerations or abrasions bilateral feet. Nails 1-5 bilateral  are thickened discolored and elongated with subungual debris. Small ulceration noted to distal right hallux is healed.  Musculoskeletal: MMT 5/5 bilateral lower extremities in DF, PF, Inversion and Eversion. Deceased ROM in DF of ankle joint. 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 Hammered digits 1-5 bilateral.  Neurological: Sensation intact to light touch. Protective sensation diminished bilateral.    Assessment:   1. Pain due to onychomycosis of toenails of both feet   2. Diabetic polyneuropathy  associated with type 2 diabetes mellitus (HCC)   3. Pre-ulcerative calluses            Plan:  Patient was evaluated and treated and all questions answered. Ulcer right distal hallux-healed.  -Discussed and educated patient on diabetic foot care, especially with  regards to the vascular, neurological and musculoskeletal systems.  -Stressed the importance of good glycemic control and the detriment of not  controlling glucose levels in relation to the foot. -Discussed supportive shoes at all times and checking feet regularly.  -Mechanically debrided all nails 1-5 bilateral using sterile nail nipper and filed with dremel without incident -Hyperkeratosis to distal right hallux debrided with chisel without incident.   -Answered all patient questions -Patient to return  in 3 months for at risk foot care -Patient advised to call the office if any problems or questions arise in the meantime.   Return in about 3 months (around 03/08/2023) for rfc.   Louann Sjogren, DPM

## 2022-12-06 NOTE — Telephone Encounter (Signed)
*  STAT* If patient is at the pharmacy, call can be transferred to refill team.   1. Which medications need to be refilled? (please list name of each medication and dose if known) omeprazole (PRILOSEC) 20 MG capsule   2. Which pharmacy/location (including street and city if local pharmacy) is medication to be sent to? [facility] - [place], [place] - [address]   3. Do they need a 30 day or 90 day supply? 90

## 2022-12-07 NOTE — Telephone Encounter (Signed)
Pt is requesting a refill on omeprazole. Dr. Odis Hollingshead did not prescribe this medication. Would Dr. Odis Hollingshead like to refill this medication? Please address

## 2022-12-07 NOTE — Telephone Encounter (Signed)
PCP please.   Yanni Ruberg [place], DO, [facility]

## 2022-12-08 NOTE — Telephone Encounter (Signed)
Called pt to inform her per Dr. Odis Hollingshead to contact PCP for a refill for her medication omeprazole. I advised pt that if she has any other problems, questions or concerns, to give our office a call back. Pt verbalized understanding.

## 2022-12-27 ENCOUNTER — Ambulatory Visit (INDEPENDENT_AMBULATORY_CARE_PROVIDER_SITE_OTHER): Payer: 59 | Admitting: Internal Medicine

## 2022-12-27 ENCOUNTER — Encounter: Payer: Self-pay | Admitting: Internal Medicine

## 2022-12-27 ENCOUNTER — Telehealth: Payer: Self-pay

## 2022-12-27 VITALS — BP 130/80 | HR 64 | Ht 71.0 in | Wt 226.0 lb

## 2022-12-27 DIAGNOSIS — E1142 Type 2 diabetes mellitus with diabetic polyneuropathy: Secondary | ICD-10-CM

## 2022-12-27 DIAGNOSIS — E1165 Type 2 diabetes mellitus with hyperglycemia: Secondary | ICD-10-CM | POA: Diagnosis not present

## 2022-12-27 DIAGNOSIS — Z794 Long term (current) use of insulin: Secondary | ICD-10-CM | POA: Insufficient documentation

## 2022-12-27 DIAGNOSIS — E0590 Thyrotoxicosis, unspecified without thyrotoxic crisis or storm: Secondary | ICD-10-CM | POA: Diagnosis not present

## 2022-12-27 DIAGNOSIS — E0500 Thyrotoxicosis with diffuse goiter without thyrotoxic crisis or storm: Secondary | ICD-10-CM | POA: Diagnosis not present

## 2022-12-27 LAB — MICROALBUMIN / CREATININE URINE RATIO
Creatinine,U: 77.4 mg/dL
Microalb Creat Ratio: 1.6 mg/g (ref 0.0–30.0)
Microalb, Ur: 1.3 mg/dL (ref 0.0–1.9)

## 2022-12-27 LAB — POCT GLYCOSYLATED HEMOGLOBIN (HGB A1C): Hemoglobin A1C: 9.8 % — AB (ref 4.0–5.6)

## 2022-12-27 LAB — T4, FREE: Free T4: 1 ng/dL (ref 0.60–1.60)

## 2022-12-27 LAB — TSH: TSH: 4.56 u[IU]/mL (ref 0.35–5.50)

## 2022-12-27 MED ORDER — DAPAGLIFLOZIN PRO-METFORMIN ER 5-1000 MG PO TB24: 2.0000 | ORAL_TABLET | Freq: Every day | ORAL | 3 refills | Status: DC

## 2022-12-27 MED ORDER — INSULIN LISPRO (1 UNIT DIAL) 100 UNIT/ML (KWIKPEN): PEN_INJECTOR | SUBCUTANEOUS | 2 refills | Status: DC

## 2022-12-27 MED ORDER — INSULIN PEN NEEDLE 32G X 4 MM MISC: 1.0000 | Freq: Four times a day (QID) | 2 refills | Status: DC

## 2022-12-27 MED ORDER — TIRZEPATIDE 7.5 MG/0.5ML ~~LOC~~ SOAJ: 7.5000 mg | SUBCUTANEOUS | 2 refills | Status: DC

## 2022-12-27 NOTE — Progress Notes (Unsigned)
Name: Carmen Armstrong  MRN/ DOB: [id number], 02/12/1966    Age/ Sex: 57 y.o., female     PCP: Lewis Moccasin, MD   Reason for Endocrinology Evaluation: Hyperthyroidism     Initial Endocrinology Clinic Visit: 11/05/2019    PATIENT IDENTIFIER: [name] is a 57 y.o., female with a past medical history of HTN, T2DM, PAF , CHF and dyslipidemia. She has followed with [facility] since 11/05/2019 for consultative assistance with management of her Hyperthyroidism.      HISTORICAL SUMMARY:   She was diagnosed with hyperthyroidism in 10/2018 during routine workup . Two months prior to her presentation she was noted with dry eyes. Had occasional double vision, and is under the care of an ophthalmologist.     Pt on Multaq  Since 2020. S/P cardiac ablation  . Has not been on amiodarone since 2021  Thyroid ultrasound did not reveal any thyroid nodules 12/2020 TRAb elevated 30.55 (2021)  Methimazole started 10/2019   Paternal grandmother with thyroid disease     DM HISTORY: She has been diagnosed with DM > 20 yrs ago.  She is intolerant to Trulicity.  Her A1c has ranged from 8.2% in 2022, peaking at 9.0% in 08/2022  I have assumed her diabetes management 08/2022 per her request  On her initial visit with me we switched Ozempic to [facility]  Continued basal insulin, and adjusted Xigduo  She was prescribed Humalog to pain management clinic 2024  SUBJECTIVE:   During the last visit (09/11/2022): A1c 9.0%  Today (12/27/22): Carmen Armstrong is here for follow-up with diabetes management and hyperthyroidism. She checks her blood sugars multiple times daily through freestyle libre. The patient has not had hypoglycemic episodes since the last clinic visit.    She is s/p spinal cord stimulator placement 04/2022  Patient continues to follow-up with cardiology for CAD, peripheral artery disease, and   Pt continues with weight gain Denies nausea or  vomiting  Has noted pruritus with mounjaro injection  She continues to use sugar-sweetened beverages  Denies palpitations but has SOB over the past 2 weeks that she attributes stress  Has occasional constipation- on linzess every other day  Denies local neck symptoms     HOME DIABETES REGIMEN:  Methimazole 5 mg , 1 tabs daily Xigduo 06-998 XR, 2 tabs daily-continue to take 1 tablet daily Mounjaro 5 mg weekly  Tresiba 60 units daily  Humalog 5 units with each meal     Statin: yes ACE-I/ARB: yes   CONTINUOUS GLUCOSE MONITORING RECORD INTERPRETATION    Dates of Recording: 10/31-11/13/2024  Sensor description:dexcom  Results statistics:   CGM use % of time 57  Average and SD 174/46  Time in range     62   %  % Time Above 180 31  % Time above 250 7  % Time Below target 0   Glycemic patterns summary: BGs fluctuate through the day and night  Hyperglycemic episodes postprandial  Hypoglycemic episodes occurred N/A  Overnight periods: Variable    DIABETIC COMPLICATIONS: Microvascular complications:  Neuropathy  Denies:  Last Eye Exam: Completed   Macrovascular complications:  CAD, PVD Denies:  CVA       HISTORY:  Past Medical History:  Past Medical History:  Diagnosis Date   Arrhythmia    Arthritis    Atrial fibrillation (HCC)    CAD (coronary artery disease)    Chronic lower back pain    Depression    Diabetes mellitus without complication (HCC)  Eczema    GAD (generalized anxiety disorder)    GERD (gastroesophageal reflux disease)    H/O vitamin D deficiency    Heart failure (HCC)    Hyperlipidemia    Hypertension    Hypothyroidism    Insomnia    Peripheral neuropathy    PVD (peripheral vascular disease) (HCC)    Past Surgical History:  Past Surgical History:  Procedure Laterality Date   ANGIOPLASTY     legs   BACK SURGERY     5 different times   BREAST BIOPSY Right 07/24/2022   MM RT BREAST BX W LOC DEV 1ST LESION IMAGE BX SPEC  STEREO GUIDE 07/24/2022 [facility]   CARDIAC CATHETERIZATION  2020   REPLACEMENT TOTAL HIP W/  RESURFACING IMPLANTS Right 2018   RIGHT AND LEFT HEART CATH     ROTATOR CUFF REPAIR Right 2016   SPINAL CORD STIMULATOR INSERTION N/A 05/08/2022   Procedure: PLACEMENT OF SPINAL CORD STIMULATOR;  Surgeon: Venita Lick, MD;  Location: [facility] OR;  Service: Orthopedics;  Laterality: N/A;   SPINE SURGERY  2014,2016,2017,2019   toe amputated  09/2018   5th left toe   TOE AMPUTATION Left    great toe on left side   Social History:  reports that she has been smoking cigarettes. She has a 15 pack-year smoking history. She has never used smokeless tobacco. She reports that she does not currently use alcohol. She reports that she does not currently use drugs. Family History:  Family History  Problem Relation Age of Onset   Hypertension Mother    Hyperlipidemia Mother    Diabetes Mother    Stroke Mother    Clotting disorder Father    Hypertension Father    Hyperlipidemia Father    Hyperlipidemia Sister    Diabetes Brother    Neuropathy Brother      HOME MEDICATIONS: Allergies as of 12/27/2022       Reactions   Corylus    Omeprazole    Other reaction(s): Not available   Other    Hazelnuts- SOB   Pear    Penicillin G    whelps   Trulicity [dulaglutide]    Large itchy bumps        Medication List        Accurate as of December 27, 2022  8:19 AM. If you have any questions, ask your nurse or doctor.          amLODipine 10 MG tablet Commonly known as: NORVASC Take 1 tablet (10 mg total) by mouth daily.   atorvastatin 40 MG tablet Commonly known as: LIPITOR Take 40 mg by mouth daily.   buPROPion 300 MG 24 hr tablet Commonly known as: WELLBUTRIN XL Take 300 mg by mouth every morning.   candesartan 4 MG tablet Commonly known as: ATACAND Take 4 mg by mouth daily.   Dapagliflozin Pro-metFORMIN ER 06-998 MG Tb24 Commonly known as: Xigduo XR Take 2 tablets by mouth  daily.   Dexcom G7 Receiver Devi USE AS DIRECTED TO TEST BLOOD GLUCOSE   Eliquis 5 MG Tabs tablet Generic drug: apixaban Take 5 mg by mouth 2 (two) times daily.   FLUoxetine 20 MG capsule Commonly known as: PROZAC Take 20 mg by mouth daily.   FreeStyle Libre 2 Sensor Misc 1 Device by Other route every 14 (fourteen) days.   gabapentin 300 MG capsule Commonly known as: NEURONTIN Take 300 mg by mouth daily.   gabapentin 600 MG tablet Commonly known as: NEURONTIN Take  600 mg by mouth daily.   insulin lispro 100 UNIT/ML KwikPen Commonly known as: HUMALOG SMARTSIG:5 Unit(s) SUB-Q 3 Times Daily   Insulin Pen Needle 32G X 4 MM Misc 1 Device by Does not apply route daily in the afternoon.   isosorbide mononitrate 60 MG 24 hr tablet Commonly known as: IMDUR Take 1 tablet (60 mg total) by mouth in the morning.   Linzess 145 MCG Caps capsule Generic drug: linaclotide Take 145 mcg by mouth daily.   methimazole 5 MG tablet Commonly known as: TAPAZOLE Take 1 tablet (5 mg total) by mouth daily.   metoprolol succinate 100 MG 24 hr tablet Commonly known as: TOPROL-XL Take 100 mg by mouth daily.   morphine 15 MG 12 hr tablet Commonly known as: MS CONTIN Take 15 mg by mouth 3 (three) times daily as needed for pain.   naloxone 4 MG/0.1ML Liqd nasal spray kit Commonly known as: NARCAN Place 1 spray into the nose as needed (OD).   omeprazole 20 MG capsule Commonly known as: PRILOSEC Take 20 mg by mouth daily.   oxyCODONE-acetaminophen 5-325 MG tablet Commonly known as: PERCOCET/ROXICET Take 0.5-1 tablets by mouth 2 (two) times daily as needed for moderate pain.   Simply Sleep 25 MG tablet Generic drug: diphenhydrAMINE Take 25 mg by mouth at bedtime.   spironolactone 25 MG tablet Commonly known as: ALDACTONE TAKE 1 TABLET (25 MG TOTAL) BY MOUTH DAILY.   tirzepatide 5 MG/0.5ML Pen Commonly known as: MOUNJARO Inject 5 mg into the skin once a week.   Evaristo Bury FlexTouch  100 UNIT/ML FlexTouch Pen Generic drug: insulin degludec Inject 50 Units into the skin daily. What changed: how much to take   Tresiba FlexTouch 200 UNIT/ML FlexTouch Pen Generic drug: insulin degludec SMARTSIG:50 Unit(s) SUB-Q Daily What changed: Another medication with the same name was changed. Make sure you understand how and when to take each.   zolpidem 12.5 MG CR tablet Commonly known as: AMBIEN CR Take 12.5 mg by mouth at bedtime.   zolpidem 6.25 MG CR tablet Commonly known as: AMBIEN CR Take by mouth.          OBJECTIVE:   PHYSICAL EXAM: VS: BP 130/80 (BP Location: Left Arm, Patient Position: Sitting, Cuff Size: Small)   Pulse 64   Ht 5\' 11"  (1.803 m)   Wt 226 lb (102.5 kg)   SpO2 99%   BMI 31.52 kg/m     EXAM: General: Pt appears well and is in NAD  Neck: General: Supple without adenopathy. Thyroid: no nodules appreciated   Lungs: Clear with good BS bilat  Heart: Auscultation: RRR.  Extremities:  BL LE: no  pretibial edema   Mental Status: Judgment, insight: Intact Mood and affect: No depression, anxiety, or agitation   DM Foot Exam 12/06/2022 per podiatry   DATA REVIEWED:   Latest Reference Range & Units 12/27/22 08:46  TSH 0.35 - 5.50 uIU/mL 4.56  T4,Free(Direct) 0.60 - 1.60 ng/dL 1.61     Latest Reference Range & Units 12/27/22 09:50  Creatinine,U mg/dL 09.6  Microalb, Ur 0.0 - 1.9 mg/dL 1.3  MICROALB/CREAT RATIO 0.0 - 30.0 mg/g 1.6        Results for Carmen Armstrong, Carmen Armstrong (MRN [id number]) as of 01/26/2020 13:34  Ref. Range 12/17/2019 10:24  TRAB Latest Ref Range: <=2.00 IU/L 30.55 (H)    Thyroid Ultrasound 01/12/2021  Estimated total number of nodules >/= 1 cm: 0   Number of spongiform nodules >/=  2 cm not described below (TR1):  0   Number of mixed cystic and solid nodules >/= 1.5 cm not described below (TR2): 0   _________________________________________________________   No discrete nodules are seen within the thyroid  gland.   IMPRESSION: 1. Borderline enlarged thyroid gland without discrete nodule identified.    ASSESSMENT / PLAN / RECOMMENDATIONS:   1) Type 2 Diabetes Mellitus, Poorly controlled, With neuropathic and macrovascular complications - Most recent A1c of 9.8 %. Goal A1c < 7.0 %.    -Patient continues with worsening glycemic control, patient continues to drink sugar sweetened beverages, she was advised to avoid this.  She has been taking less Xigduo than previously prescribed and more insulin than previously prescribed -She was prescribed Humalog through her pain management clinic -Patient advised to increase Xigduo to 2 tablets every morning -Will increase Mounjaro as below, and decrease Tresiba -She will be provided with correction scale to use before each meal and bedtime for Humalog -MA/CR ratio normal  MEDICATIONS: Increase Xigduo 06-998 XR, 2 tabs daily Increase Mounjaro 7.5 mg weekly Decrease Tresiba 56 units daily Start correction factor : Humalog (BG-130/30) TIDQAC and QHS    EDUCATION / INSTRUCTIONS: BG monitoring instructions: Patient is instructed to check her  blood sugars 3 times a day. Call [facility] if: BG persistently < 70  I reviewed the Rule of 15 for the treatment of hypoglycemia in detail with the patient. Literature supplied.    2) Diabetic complications:  Eye: Does not have known diabetic retinopathy.  Neuro/ Feet: Does  have known diabetic peripheral neuropathy .  Renal: Patient does not have known baseline CKD. She   is  on an ACEI/ARB at present.    3)Hyperthyroidism Secondary to Graves' Disease:    - Pt is clinically euthyroid  - No local neck symptoms -TFTs normal, but TSH at the upper limit of normal, will decrease methimazole as below   Medications   Decrease methimazole 5 mg , 1 tablet Monday through Saturday and none on Sundays   4). Graves' Disease:   -No extrathyroidal manifestation of Graves' disease    F/U  in 4 months     Signed electronically by: Lyndle Herrlich, MD  [facility]  [facility] [address] [place], [place] [address] Phone: [phone] FAX: [phone]      CC: Lewis Moccasin, MD [address] [place] [address] Phone: [phone]  Fax: [phone]   Return to Endocrinology clinic as below: Future Appointments  Date Time Provider Department Center  03/12/2023 10:15 AM Louann Sjogren, DPM [facility]  03/20/2023 11:00 AM Tessa Lerner, DO [facility]

## 2022-12-27 NOTE — Patient Instructions (Signed)
Take  Xigduo 06-998 XR, TWO tablets every Morning  Increase Mounjaro 7.5 mg weekly Continue Tresiba 56 units daily  Humalog correctional insulin: Use the scale below to help guide you before each meal and bedtime   Blood sugar before meal Number of units to inject  Less than 160 0 unit  161 -  190 1 units  191 -  220 2 units  221 -  250 3 units  251 -  280 4 units  281 -  310 5 units  311 -  340 6 units  341 -  370 7 units  371 -  400 8 units    HOW TO TREAT LOW BLOOD SUGARS (Blood sugar LESS THAN 70 MG/DL) Please follow the RULE OF 15 for the treatment of hypoglycemia treatment (when your (blood sugars are less than 70 mg/dL)   STEP 1: Take 15 grams of carbohydrates when your blood sugar is low, which includes:  3-4 GLUCOSE TABS  OR 3-4 OZ OF JUICE OR REGULAR SODA OR ONE TUBE OF GLUCOSE GEL    STEP 2: RECHECK blood sugar in 15 MINUTES STEP 3: If your blood sugar is still low at the 15 minute recheck --> then, go back to STEP 1 and treat AGAIN with another 15 grams of carbohydrates.

## 2022-12-28 ENCOUNTER — Encounter: Payer: Self-pay | Admitting: Internal Medicine

## 2022-12-28 MED ORDER — METHIMAZOLE 5 MG PO TABS: 5.0000 mg | ORAL_TABLET | ORAL | 3 refills | Status: DC

## 2023-03-12 ENCOUNTER — Ambulatory Visit: Payer: Self-pay | Admitting: Cardiology

## 2023-03-12 ENCOUNTER — Ambulatory Visit: Payer: 59 | Admitting: Podiatry

## 2023-03-13 ENCOUNTER — Other Ambulatory Visit: Payer: Self-pay | Admitting: Internal Medicine

## 2023-03-16 ENCOUNTER — Other Ambulatory Visit: Payer: Self-pay | Admitting: Internal Medicine

## 2023-03-16 DIAGNOSIS — E1165 Type 2 diabetes mellitus with hyperglycemia: Secondary | ICD-10-CM

## 2023-03-20 ENCOUNTER — Other Ambulatory Visit: Payer: Self-pay

## 2023-03-20 ENCOUNTER — Ambulatory Visit: Payer: 59 | Admitting: Cardiology

## 2023-03-20 MED ORDER — INSULIN LISPRO (1 UNIT DIAL) 100 UNIT/ML (KWIKPEN): PEN_INJECTOR | SUBCUTANEOUS | 2 refills | Status: DC

## 2023-03-20 MED ORDER — METHIMAZOLE 5 MG PO TABS: 5.0000 mg | ORAL_TABLET | ORAL | 3 refills | Status: DC

## 2023-03-20 MED ORDER — TRESIBA FLEXTOUCH 200 UNIT/ML ~~LOC~~ SOPN: 56.0000 [IU] | PEN_INJECTOR | Freq: Every day | SUBCUTANEOUS | 1 refills | Status: DC

## 2023-03-20 MED ORDER — DAPAGLIFLOZIN PRO-METFORMIN ER 5-1000 MG PO TB24: 2.0000 | ORAL_TABLET | Freq: Every day | ORAL | 3 refills | Status: DC

## 2023-03-22 ENCOUNTER — Other Ambulatory Visit: Payer: Self-pay

## 2023-03-22 DIAGNOSIS — I2510 Atherosclerotic heart disease of native coronary artery without angina pectoris: Secondary | ICD-10-CM

## 2023-03-22 DIAGNOSIS — R931 Abnormal findings on diagnostic imaging of heart and coronary circulation: Secondary | ICD-10-CM

## 2023-03-22 MED ORDER — SPIRONOLACTONE 25 MG PO TABS: 25.0000 mg | ORAL_TABLET | Freq: Every day | ORAL | 1 refills | Status: DC

## 2023-03-22 MED ORDER — AMLODIPINE BESYLATE 10 MG PO TABS: 10.0000 mg | ORAL_TABLET | Freq: Every day | ORAL | 1 refills | Status: DC

## 2023-03-22 MED ORDER — ISOSORBIDE MONONITRATE ER 60 MG PO TB24: 60.0000 mg | ORAL_TABLET | Freq: Every morning | ORAL | 1 refills | Status: DC

## 2023-03-26 ENCOUNTER — Ambulatory Visit: Payer: 59 | Admitting: Podiatry

## 2023-03-26 ENCOUNTER — Ambulatory Visit (INDEPENDENT_AMBULATORY_CARE_PROVIDER_SITE_OTHER): Payer: 59

## 2023-03-26 DIAGNOSIS — E08621 Diabetes mellitus due to underlying condition with foot ulcer: Secondary | ICD-10-CM

## 2023-03-26 DIAGNOSIS — M79675 Pain in left toe(s): Secondary | ICD-10-CM

## 2023-03-26 DIAGNOSIS — M2042 Other hammer toe(s) (acquired), left foot: Secondary | ICD-10-CM

## 2023-03-26 DIAGNOSIS — L97522 Non-pressure chronic ulcer of other part of left foot with fat layer exposed: Secondary | ICD-10-CM | POA: Diagnosis not present

## 2023-03-26 DIAGNOSIS — M2041 Other hammer toe(s) (acquired), right foot: Secondary | ICD-10-CM | POA: Diagnosis not present

## 2023-03-26 DIAGNOSIS — B351 Tinea unguium: Secondary | ICD-10-CM

## 2023-03-26 DIAGNOSIS — M79674 Pain in right toe(s): Secondary | ICD-10-CM

## 2023-03-26 NOTE — Progress Notes (Signed)
 Subjective:   Patient ID: Carmen Armstrong, female   DOB: 58 y.o.   MRN: [id number]   HPI Patient presents concerned about discoloration of the second digit of the left with no active drainage and has lost already the fifth digit left and part of the big toe left.  Patient right has some keratotic tissue on the big toe   ROS      Objective:  Physical Exam  Neurovascular status unchanged from previous visit with elongated second toe left that gets irritated on the distal end keratotic tissue and on the big toe right     Assessment:  Lesions with mild discoloration second toe left with vascular disease which has been present long-term diabetes     Plan:  H&P precautionary x-ray taken the left I discussed the discoloration I want her to keep an eye on this I debrided some tissue which made it feel better and I advised on the importance of not exposing it to cold and to wear thick socks.  She understands she does run risk of eventually losing this toe and if circulatory status becomes compromised with significant rate it will require amputation.  Continue to be active and wear smart shoe gear choices  X-rays indicating elongated second digit left no indications of osteolysis or signs of bone infection

## 2023-03-27 ENCOUNTER — Other Ambulatory Visit: Payer: Self-pay | Admitting: Cardiology

## 2023-03-27 DIAGNOSIS — I480 Paroxysmal atrial fibrillation: Secondary | ICD-10-CM

## 2023-03-27 MED ORDER — ELIQUIS 5 MG PO TABS: 5.0000 mg | ORAL_TABLET | Freq: Two times a day (BID) | ORAL | 1 refills | Status: DC

## 2023-03-27 NOTE — Telephone Encounter (Signed)
Eliquis 5mg  refill request received. Patient is 59 years old, weight-102.5kg, Crea-1.09 on 09/07/22 via scanned labs from Dr. Chauncy Passy office, Diagnosis-Afib, and last seen by Dr. Odis Hollingshead on 09/07/22 and pending appt on 05/02/23. Dose is appropriate based on dosing criteria. Will send in refill to requested pharmacy.

## 2023-03-27 NOTE — Telephone Encounter (Signed)
*  STAT* If patient is at the pharmacy, call can be transferred to refill team.   1. Which medications need to be refilled? (please list name of each medication and dose if known)   candesartan (ATACAND) 4 MG tablet  ELIQUIS 5 MG TABS tablet   2. Would you like to learn more about the convenience, safety, & potential cost savings by using the [facility]?   3. Are you open to using the [facility] (Type [facility]. ).  4. Which pharmacy/location (including street and city if local pharmacy) is medication to be sent to?  [facility] - [address], [place] - [address]   5. Do they need a 30 day or 90 day supply?   90 day  Caller Maralyn Sago) stated patient may still have some medication.

## 2023-03-27 NOTE — Telephone Encounter (Signed)
Pt has an upcoming appt in March 2025 with Dr. Odis Hollingshead. Would Dr. Odis Hollingshead like to refill medication candesartan? Dr. Odis Hollingshead did not prescribe this medication. Please address

## 2023-03-29 NOTE — Telephone Encounter (Signed)
I usually don't fill Candesartan - please check you filled the initial script. May be PCP.   Demonta Wombles [place], DO, [facility]

## 2023-04-13 ENCOUNTER — Encounter: Payer: Self-pay | Admitting: Internal Medicine

## 2023-04-13 ENCOUNTER — Ambulatory Visit: Payer: 59 | Admitting: Internal Medicine

## 2023-04-13 VITALS — BP 122/78 | HR 66 | Ht 71.0 in | Wt 225.0 lb

## 2023-04-13 DIAGNOSIS — E0500 Thyrotoxicosis with diffuse goiter without thyrotoxic crisis or storm: Secondary | ICD-10-CM

## 2023-04-13 DIAGNOSIS — E1142 Type 2 diabetes mellitus with diabetic polyneuropathy: Secondary | ICD-10-CM

## 2023-04-13 DIAGNOSIS — Z794 Long term (current) use of insulin: Secondary | ICD-10-CM

## 2023-04-13 DIAGNOSIS — E1165 Type 2 diabetes mellitus with hyperglycemia: Secondary | ICD-10-CM

## 2023-04-13 DIAGNOSIS — E0590 Thyrotoxicosis, unspecified without thyrotoxic crisis or storm: Secondary | ICD-10-CM | POA: Diagnosis not present

## 2023-04-13 LAB — POCT GLYCOSYLATED HEMOGLOBIN (HGB A1C): Hemoglobin A1C: 13.1 % — AB (ref 4.0–5.6)

## 2023-04-13 MED ORDER — DAPAGLIFLOZIN PRO-METFORMIN ER 5-1000 MG PO TB24: 2.0000 | ORAL_TABLET | Freq: Every day | ORAL | 3 refills | Status: DC

## 2023-04-13 MED ORDER — INSULIN LISPRO (1 UNIT DIAL) 100 UNIT/ML (KWIKPEN): 10.0000 [IU] | PEN_INJECTOR | Freq: Three times a day (TID) | SUBCUTANEOUS | 2 refills | Status: DC

## 2023-04-13 MED ORDER — BD PEN NEEDLE NANO U/F 32G X 4 MM MISC: 1.0000 | Freq: Four times a day (QID) | 2 refills | Status: DC

## 2023-04-13 MED ORDER — TIRZEPATIDE 10 MG/0.5ML ~~LOC~~ SOAJ: 10.0000 mg | SUBCUTANEOUS | 3 refills | Status: DC

## 2023-04-13 MED ORDER — TRESIBA FLEXTOUCH 100 UNIT/ML ~~LOC~~ SOPN
64.0000 [IU] | PEN_INJECTOR | Freq: Every day | SUBCUTANEOUS | 4 refills | Status: DC
Start: 2023-04-13 — End: 2023-04-16

## 2023-04-13 MED ORDER — TRESIBA FLEXTOUCH 200 UNIT/ML ~~LOC~~ SOPN: 56.0000 [IU] | PEN_INJECTOR | Freq: Every day | SUBCUTANEOUS | 1 refills | Status: DC

## 2023-04-13 NOTE — Progress Notes (Signed)
 Name: Carmen Armstrong  MRN/ DOB: [id number], 03/07/1965    Age/ Sex: 58 y.o., female     PCP: Lewis Moccasin, MD   Reason for Endocrinology Evaluation: Hyperthyroidism     Initial Endocrinology Clinic Visit: 11/05/2019    PATIENT IDENTIFIER: [name] is a 58 y.o., female with a past medical history of HTN, T2DM, PAF , CHF and dyslipidemia. She has followed with [facility] since 11/05/2019 for consultative assistance with management of her Hyperthyroidism.      HISTORICAL SUMMARY:   She was diagnosed with hyperthyroidism in 10/2018 during routine workup . Two months prior to her presentation she was noted with dry eyes. Had occasional double vision, and is under the care of an ophthalmologist.     Pt on Multaq  Since 2020. S/P cardiac ablation  . Has not been on amiodarone since 2021  Thyroid ultrasound did not reveal any thyroid nodules 12/2020 TRAb elevated 30.55 (2021)  Methimazole started 10/2019   Paternal grandmother with thyroid disease     DM HISTORY: She has been diagnosed with DM > 20 yrs ago.  She is intolerant to Trulicity.  Her A1c has ranged from 8.2% in 2022, peaking at 9.0% in 08/2022  I have assumed her diabetes management 08/2022 per her request  On her initial visit with me we switched Ozempic to [facility].  Continued basal insulin, and adjusted Xigduo  She was prescribed Humalog to pain management clinic 2024  SUBJECTIVE:   During the last visit (12/27/2022): A1c 9.8%  Today (04/13/23): Carmen Armstrong is here for follow-up with diabetes management and hyperthyroidism. She checks her blood sugars multiple times daily through freestyle libre. The patient has not had hypoglycemic episodes since the last clinic visit.    She is s/p spinal cord stimulator placement 04/2022  Patient continues to follow-up with cardiology for CAD, peripheral artery disease She has been following with podiatry for a left toe  ulcer  She has not been taking mounjaro 5 weeks  Denies nausea or vomiting  Has occasional constipation- on linzess every other day  Denies local neck symptoms  Denies palpitations  Patient is upset because she get discharged from her pain management clinic    HOME DIABETES REGIMEN:  Methimazole 5 mg , 1 tablet Monday through Saturday and none on Sundays- has been taking it daily  Xigduo 06-998 XR, 2 tabs daily-continue to take 1 tablet daily Mounjaro 7.5 mg weekly  Tresiba 56 units daily  Start correction factor : Humalog (BG-130/30) TIDQAC and QHS   Statin: yes ACE-I/ARB: yes   CONTINUOUS GLUCOSE MONITORING RECORD INTERPRETATION    Dates of Recording:2/15-2/28/2025  Sensor description:dexcom  Results statistics:   CGM use % of time 92  Average and SD 369/50  Time in range 0  %  % Time Above 180 4  % Time above 250 96  % Time Below target 0   Glycemic patterns summary: BG's high through the day and night Hyperglycemic episodes postprandial  Hypoglycemic episodes occurred N/A  Overnight periods: high     DIABETIC COMPLICATIONS: Microvascular complications:  Neuropathy  Denies:  Last Eye Exam: Completed   Macrovascular complications:  CAD, PVD Denies:  CVA       HISTORY:  Past Medical History:  Past Medical History:  Diagnosis Date   Arrhythmia    Arthritis    Atrial fibrillation (HCC)    CAD (coronary artery disease)    Chronic lower back pain    Depression  Diabetes mellitus without complication (HCC)    Eczema    GAD (generalized anxiety disorder)    GERD (gastroesophageal reflux disease)    H/O vitamin D deficiency    Heart failure (HCC)    Hyperlipidemia    Hypertension    Hypothyroidism    Insomnia    Peripheral neuropathy    PVD (peripheral vascular disease) (HCC)    Past Surgical History:  Past Surgical History:  Procedure Laterality Date   ANGIOPLASTY     legs   BACK SURGERY     5 different times   BREAST BIOPSY  Right 07/24/2022   MM RT BREAST BX W LOC DEV 1ST LESION IMAGE BX SPEC STEREO GUIDE 07/24/2022 [facility]   CARDIAC CATHETERIZATION  2020   REPLACEMENT TOTAL HIP W/  RESURFACING IMPLANTS Right 2018   RIGHT AND LEFT HEART CATH     ROTATOR CUFF REPAIR Right 2016   SPINAL CORD STIMULATOR INSERTION N/A 05/08/2022   Procedure: PLACEMENT OF SPINAL CORD STIMULATOR;  Surgeon: Venita Lick, MD;  Location: [facility] OR;  Service: Orthopedics;  Laterality: N/A;   SPINE SURGERY  2014,2016,2017,2019   toe amputated  09/2018   5th left toe   TOE AMPUTATION Left    great toe on left side   Social History:  reports that she has been smoking cigarettes. She has a 15 pack-year smoking history. She has never used smokeless tobacco. She reports that she does not currently use alcohol. She reports that she does not currently use drugs. Family History:  Family History  Problem Relation Age of Onset   Hypertension Mother    Hyperlipidemia Mother    Diabetes Mother    Stroke Mother    Clotting disorder Father    Hypertension Father    Hyperlipidemia Father    Hyperlipidemia Sister    Diabetes Brother    Neuropathy Brother      HOME MEDICATIONS: Allergies as of 04/13/2023       Reactions   Corylus    Omeprazole    Other reaction(s): Not available   Other    Hazelnuts- SOB   Pear    Penicillin G    whelps   Trulicity [dulaglutide]    Large itchy bumps        Medication List        Accurate as of April 13, 2023  1:13 PM. If you have any questions, ask your nurse or doctor.          amLODipine 10 MG tablet Commonly known as: NORVASC Take 1 tablet (10 mg total) by mouth daily.   atorvastatin 40 MG tablet Commonly known as: LIPITOR Take 40 mg by mouth daily.   BD Pen Needle Nano U/F 32G X 4 MM Misc Generic drug: Insulin Pen Needle USE TO INJECT INSULIN IN THE MORNING, NOON, IN THE EVENING, AND AT BEDTIME *NEW PRESCRIPTION REQUEST*   buPROPion 300 MG 24 hr tablet Commonly  known as: WELLBUTRIN XL Take 300 mg by mouth every morning.   candesartan 4 MG tablet Commonly known as: ATACAND Take 4 mg by mouth daily.   Dapagliflozin Pro-metFORMIN ER 06-998 MG Tb24 Commonly known as: Xigduo XR Take 2 tablets by mouth daily.   Xigduo XR 11-998 MG Tb24 Generic drug: Dapagliflozin Pro-metFORMIN ER Take 1 tablet by mouth in the morning and at bedtime.   Dexcom G7 Receiver Devi USE AS DIRECTED TO TEST BLOOD GLUCOSE   Eliquis 5 MG Tabs tablet Generic drug: apixaban Take 1 tablet (  5 mg total) by mouth 2 (two) times daily.   FLUoxetine 20 MG capsule Commonly known as: PROZAC Take 20 mg by mouth daily.   gabapentin 300 MG capsule Commonly known as: NEURONTIN Take 300 mg by mouth daily.   gabapentin 600 MG tablet Commonly known as: NEURONTIN Take 600 mg by mouth daily.   insulin lispro 100 UNIT/ML KwikPen Commonly known as: HUMALOG Max daily 30 units   isosorbide mononitrate 60 MG 24 hr tablet Commonly known as: IMDUR Take 1 tablet (60 mg total) by mouth in the morning.   Linzess 145 MCG Caps capsule Generic drug: linaclotide Take 145 mcg by mouth daily.   methimazole 5 MG tablet Commonly known as: TAPAZOLE Take 1 tablet (5 mg total) by mouth as directed. 1 tablet Monday through Saturday, none on Sundays   metoprolol succinate 100 MG 24 hr tablet Commonly known as: TOPROL-XL Take 100 mg by mouth daily.   morphine 15 MG 12 hr tablet Commonly known as: MS CONTIN Take 15 mg by mouth 3 (three) times daily as needed for pain.   Mounjaro 7.5 MG/0.5ML Pen Generic drug: tirzepatide INJECT 7.5 MG SUBCUTANEOUSLY ONCE A WEEK *NEW PRESCRIPTION REQUEST*   naloxone 4 MG/0.1ML Liqd nasal spray kit Commonly known as: NARCAN Place 1 spray into the nose as needed (OD).   omeprazole 20 MG capsule Commonly known as: PRILOSEC Take 20 mg by mouth daily.   oxyCODONE-acetaminophen 5-325 MG tablet Commonly known as: PERCOCET/ROXICET Take 0.5-1 tablets by  mouth 2 (two) times daily as needed for moderate pain.   Simply Sleep 25 MG tablet Generic drug: diphenhydrAMINE Take 25 mg by mouth at bedtime.   spironolactone 25 MG tablet Commonly known as: ALDACTONE Take 1 tablet (25 mg total) by mouth daily.   Evaristo Bury FlexTouch 100 UNIT/ML FlexTouch Pen Generic drug: insulin degludec Inject 50 Units into the skin daily. What changed: how much to take   Tresiba FlexTouch 200 UNIT/ML FlexTouch Pen Generic drug: insulin degludec Inject 56 Units into the skin daily at 6 (six) AM. SMARTSIG:50 Unit(s) SUB-Q Daily What changed: Another medication with the same name was changed. Make sure you understand how and when to take each.   zolpidem 6.25 MG CR tablet Commonly known as: AMBIEN CR Take by mouth. What changed: Another medication with the same name was removed. Continue taking this medication, and follow the directions you see here. Changed by: Johnney Ou Jimmi Sidener          OBJECTIVE:   PHYSICAL EXAM: VS: BP 122/78 (BP Location: Right Arm, Patient Position: Sitting, Cuff Size: Normal)   Pulse 66   Ht 5\' 11"  (1.803 m)   Wt 225 lb (102.1 kg)   SpO2 99%   BMI 31.38 kg/m     EXAM: General:   Neck: General: Supple without adenopathy. Thyroid: no nodules appreciated   Lungs: Clear with good BS bilat  Heart: Auscultation: RRR.  Mental Status: Judgment, insight: Intact Mood and affect: No depression, anxiety, or agitation   DM Foot Exam 03/26/2023  per podiatry      DATA REVIEWED:   Latest Reference Range & Units 12/27/22 08:46  TSH 0.35 - 5.50 uIU/mL 4.56  T4,Free(Direct) 0.60 - 1.60 ng/dL 1.61     Latest Reference Range & Units 12/27/22 09:50  Creatinine,U mg/dL 09.6  Microalb, Ur 0.0 - 1.9 mg/dL 1.3  MICROALB/CREAT RATIO 0.0 - 30.0 mg/g 1.6        Results for ARTHELIA, CALLICOTT (MRN [id number]) as of 01/26/2020 13:34  Ref. Range 12/17/2019 10:24  TRAB Latest Ref Range: <=2.00 IU/L 30.55 (H)    Thyroid  Ultrasound 01/12/2021  Estimated total number of nodules >/= 1 cm: 0   Number of spongiform nodules >/=  2 cm not described below (TR1): 0   Number of mixed cystic and solid nodules >/= 1.5 cm not described below (TR2): 0   _________________________________________________________   No discrete nodules are seen within the thyroid gland.   IMPRESSION: 1. Borderline enlarged thyroid gland without discrete nodule identified.    ASSESSMENT / PLAN / RECOMMENDATIONS:   1) Type 2 Diabetes Mellitus, Poorly controlled, With neuropathic and macrovascular complications - Most recent A1c of 13.1 %. Goal A1c < 7.0 %.    -Patient continues with worsening glycemic control -Will increase Mounjaro as below, and [place] as below  - She will be provided with a standing prandial dose and correction scale  -MA/CR ratio normal in the past  -Insurance requires Lantus/NovoLog    MEDICATIONS: Continue  Xigduo 06-998 XR, 2 tabs daily Increase Mounjaro 10 mg weekly Increase Lantus 64 units daily Take NovoLog 10 units TIDQAC Continue correction factor : Humalog (BG-130/30) TIDQAC and QHS    EDUCATION / INSTRUCTIONS: BG monitoring instructions: Patient is instructed to check her  blood sugars 3 times a day. Call [facility] if: BG persistently < 70  I reviewed the Rule of 15 for the treatment of hypoglycemia in detail with the patient. Literature supplied.    2) Diabetic complications:  Eye: Does not have known diabetic retinopathy.  Neuro/ Feet: Does  have known diabetic peripheral neuropathy .  Renal: Patient does not have known baseline CKD. She   is  on an ACEI/ARB at present.    3)Hyperthyroidism Secondary to Graves' Disease:    - Pt is clinically euthyroid  - No local neck symptoms -She was supposed to decrease her methimazole to 6 days a week, but she continues to take it daily -TFTs are normal and I will attempt to decrease methimazole again   Medications    Decrease methimazole 5 mg , 1 tablet Monday through Saturday and none on Sundays   4). Graves' Disease:   -No extrathyroidal manifestation of Graves' disease    F/U in 3 months     Signed electronically by: Lyndle Herrlich, MD  [facility]  [facility] [address] [place], [place] [address] Phone: [phone] FAX: [phone]      CC: Lewis Moccasin, MD [address] [place] [address] Phone: [phone]  Fax: [phone]   Return to Endocrinology clinic as below: Future Appointments  Date Time Provider Department Center  04/25/2023 10:45 AM Louann Sjogren, DPM [facility]  05/02/2023  9:00 AM Tessa Lerner, DO [facility]

## 2023-04-13 NOTE — Patient Instructions (Addendum)
 Take  Xigduo 06-998 XR, TWO tablets every Morning  Increase Mounjaro 10 mg weekly Increase Tresiba 64 units daily Humalog 10 units with each meal , PLUS the scale   Humalog correctional insulin: Use the scale below to help guide you before each meal and bedtime   Blood sugar before meal Number of units to inject  Less than 160 0 unit  161 -  190 1 units  191 -  220 2 units  221 -  250 3 units  251 -  280 4 units  281 -  310 5 units  311 -  340 6 units  341 -  370 7 units  371 -  400 8 units    HOW TO TREAT LOW BLOOD SUGARS (Blood sugar LESS THAN 70 MG/DL) Please follow the RULE OF 15 for the treatment of hypoglycemia treatment (when your (blood sugars are less than 70 mg/dL)   STEP 1: Take 15 grams of carbohydrates when your blood sugar is low, which includes:  3-4 GLUCOSE TABS  OR 3-4 OZ OF JUICE OR REGULAR SODA OR ONE TUBE OF GLUCOSE GEL    STEP 2: RECHECK blood sugar in 15 MINUTES STEP 3: If your blood sugar is still low at the 15 minute recheck --> then, go back to STEP 1 and treat AGAIN with another 15 grams of carbohydrates.

## 2023-04-14 ENCOUNTER — Other Ambulatory Visit: Payer: Self-pay | Admitting: Internal Medicine

## 2023-04-14 DIAGNOSIS — E1165 Type 2 diabetes mellitus with hyperglycemia: Secondary | ICD-10-CM

## 2023-04-14 DIAGNOSIS — Z794 Long term (current) use of insulin: Secondary | ICD-10-CM

## 2023-04-14 LAB — TSH: TSH: 3.01 m[IU]/L (ref 0.40–4.50)

## 2023-04-14 LAB — T4, FREE: Free T4: 1.4 ng/dL (ref 0.8–1.8)

## 2023-04-16 ENCOUNTER — Encounter: Payer: Self-pay | Admitting: Internal Medicine

## 2023-04-16 MED ORDER — NOVOLOG FLEXPEN 100 UNIT/ML ~~LOC~~ SOPN
10.0000 [IU] | PEN_INJECTOR | Freq: Three times a day (TID) | SUBCUTANEOUS | 3 refills | Status: DC
Start: 2023-04-16 — End: 2023-08-09

## 2023-04-16 MED ORDER — LANTUS SOLOSTAR 100 UNIT/ML ~~LOC~~ SOPN: 64.0000 [IU] | PEN_INJECTOR | Freq: Every day | SUBCUTANEOUS | 4 refills | Status: DC

## 2023-04-17 ENCOUNTER — Telehealth: Payer: Self-pay

## 2023-04-17 ENCOUNTER — Other Ambulatory Visit (HOSPITAL_COMMUNITY): Payer: Self-pay

## 2023-04-17 NOTE — Telephone Encounter (Signed)
 Pharmacy Patient Advocate Encounter   Received notification from Pt Calls Messages that prior authorization for Carmen Armstrong is required/requested.   Insurance verification completed.   The patient is insured through [facility] .   Per test claim: PA required; PA started via CoverMyMeds. KEY [id number] . Waiting for clinical questions to populate.

## 2023-04-17 NOTE — Telephone Encounter (Signed)
 Mounjaro needs PA

## 2023-04-19 ENCOUNTER — Emergency Department (HOSPITAL_COMMUNITY)

## 2023-04-19 ENCOUNTER — Encounter (HOSPITAL_COMMUNITY): Payer: Self-pay

## 2023-04-19 ENCOUNTER — Inpatient Hospital Stay (HOSPITAL_COMMUNITY)
Admission: EM | Admit: 2023-04-19 | Discharge: 2023-04-21 | DRG: 193 | Disposition: A | Discharge status: Home / Self Care | Attending: Internal Medicine | Admitting: Internal Medicine

## 2023-04-19 ENCOUNTER — Ambulatory Visit (HOSPITAL_COMMUNITY)
Admission: EM | Admit: 2023-04-19 | Discharge: 2023-04-19 | Disposition: A | Discharge status: Home / Self Care | Attending: Family Medicine | Admitting: Family Medicine

## 2023-04-19 ENCOUNTER — Ambulatory Visit (HOSPITAL_COMMUNITY)

## 2023-04-19 ENCOUNTER — Other Ambulatory Visit: Payer: Self-pay

## 2023-04-19 DIAGNOSIS — J101 Influenza due to other identified influenza virus with other respiratory manifestations: Principal | ICD-10-CM | POA: Diagnosis present

## 2023-04-19 DIAGNOSIS — E1151 Type 2 diabetes mellitus with diabetic peripheral angiopathy without gangrene: Secondary | ICD-10-CM | POA: Diagnosis present

## 2023-04-19 DIAGNOSIS — I2510 Atherosclerotic heart disease of native coronary artery without angina pectoris: Secondary | ICD-10-CM | POA: Diagnosis present

## 2023-04-19 DIAGNOSIS — E785 Hyperlipidemia, unspecified: Secondary | ICD-10-CM | POA: Insufficient documentation

## 2023-04-19 DIAGNOSIS — R0602 Shortness of breath: Secondary | ICD-10-CM | POA: Diagnosis not present

## 2023-04-19 DIAGNOSIS — I509 Heart failure, unspecified: Secondary | ICD-10-CM | POA: Diagnosis present

## 2023-04-19 DIAGNOSIS — F411 Generalized anxiety disorder: Secondary | ICD-10-CM | POA: Diagnosis present

## 2023-04-19 DIAGNOSIS — I110 Hypertensive heart disease with heart failure: Secondary | ICD-10-CM | POA: Diagnosis present

## 2023-04-19 DIAGNOSIS — Z794 Long term (current) use of insulin: Secondary | ICD-10-CM

## 2023-04-19 DIAGNOSIS — R0902 Hypoxemia: Secondary | ICD-10-CM

## 2023-04-19 DIAGNOSIS — I480 Paroxysmal atrial fibrillation: Secondary | ICD-10-CM | POA: Insufficient documentation

## 2023-04-19 DIAGNOSIS — Z823 Family history of stroke: Secondary | ICD-10-CM

## 2023-04-19 DIAGNOSIS — K219 Gastro-esophageal reflux disease without esophagitis: Secondary | ICD-10-CM | POA: Insufficient documentation

## 2023-04-19 DIAGNOSIS — E1142 Type 2 diabetes mellitus with diabetic polyneuropathy: Secondary | ICD-10-CM

## 2023-04-19 DIAGNOSIS — I10 Essential (primary) hypertension: Secondary | ICD-10-CM | POA: Insufficient documentation

## 2023-04-19 DIAGNOSIS — K5909 Other constipation: Secondary | ICD-10-CM | POA: Diagnosis present

## 2023-04-19 DIAGNOSIS — F32A Depression, unspecified: Secondary | ICD-10-CM | POA: Insufficient documentation

## 2023-04-19 DIAGNOSIS — G8929 Other chronic pain: Secondary | ICD-10-CM | POA: Diagnosis present

## 2023-04-19 DIAGNOSIS — E039 Hypothyroidism, unspecified: Secondary | ICD-10-CM | POA: Diagnosis present

## 2023-04-19 DIAGNOSIS — Z833 Family history of diabetes mellitus: Secondary | ICD-10-CM

## 2023-04-19 DIAGNOSIS — Z7984 Long term (current) use of oral hypoglycemic drugs: Secondary | ICD-10-CM

## 2023-04-19 DIAGNOSIS — Z1152 Encounter for screening for COVID-19: Secondary | ICD-10-CM

## 2023-04-19 DIAGNOSIS — J9601 Acute respiratory failure with hypoxia: Secondary | ICD-10-CM

## 2023-04-19 DIAGNOSIS — Z832 Family history of diseases of the blood and blood-forming organs and certain disorders involving the immune mechanism: Secondary | ICD-10-CM

## 2023-04-19 DIAGNOSIS — Z7985 Long-term (current) use of injectable non-insulin antidiabetic drugs: Secondary | ICD-10-CM

## 2023-04-19 DIAGNOSIS — E1165 Type 2 diabetes mellitus with hyperglycemia: Secondary | ICD-10-CM | POA: Diagnosis present

## 2023-04-19 DIAGNOSIS — Z8249 Family history of ischemic heart disease and other diseases of the circulatory system: Secondary | ICD-10-CM

## 2023-04-19 DIAGNOSIS — M549 Dorsalgia, unspecified: Secondary | ICD-10-CM | POA: Diagnosis present

## 2023-04-19 DIAGNOSIS — E0500 Thyrotoxicosis with diffuse goiter without thyrotoxic crisis or storm: Secondary | ICD-10-CM | POA: Diagnosis present

## 2023-04-19 DIAGNOSIS — Z79899 Other long term (current) drug therapy: Secondary | ICD-10-CM

## 2023-04-19 DIAGNOSIS — Z87891 Personal history of nicotine dependence: Secondary | ICD-10-CM

## 2023-04-19 DIAGNOSIS — Z7901 Long term (current) use of anticoagulants: Secondary | ICD-10-CM

## 2023-04-19 DIAGNOSIS — Z83438 Family history of other disorder of lipoprotein metabolism and other lipidemia: Secondary | ICD-10-CM

## 2023-04-19 LAB — COMPREHENSIVE METABOLIC PANEL
ALT: 15 U/L (ref 0–44)
AST: 19 U/L (ref 15–41)
Albumin: 3.6 g/dL (ref 3.5–5.0)
Alkaline Phosphatase: 95 U/L (ref 38–126)
Anion gap: 15 (ref 5–15)
BUN: 9 mg/dL (ref 6–20)
CO2: 24 mmol/L (ref 22–32)
Calcium: 9.6 mg/dL (ref 8.9–10.3)
Chloride: 102 mmol/L (ref 98–111)
Creatinine, Ser: 0.95 mg/dL (ref 0.44–1.00)
GFR, Estimated: >60 mL/min (ref >60)
Glucose, Bld: 187 mg/dL — ABNORMAL HIGH (ref 70–99)
Potassium: 4.6 mmol/L (ref 3.5–5.1)
Sodium: 141 mmol/L (ref 135–145)
Total Bilirubin: 0.9 mg/dL (ref 0.0–1.2)
Total Protein: 7.2 g/dL (ref 6.5–8.1)

## 2023-04-19 LAB — CBC WITH DIFFERENTIAL/PLATELET
Abs Immature Granulocytes: 0.03 10*3/uL (ref 0.00–0.07)
Basophils Absolute: 0 10*3/uL (ref 0.0–0.1)
Basophils Relative: 1 %
Eosinophils Absolute: 0 10*3/uL (ref 0.0–0.5)
Eosinophils Relative: 0 %
HCT: 41.4 % (ref 36.0–46.0)
Hemoglobin: 12.7 g/dL (ref 12.0–15.0)
Immature Granulocytes: 0 %
Lymphocytes Relative: 8 %
Lymphs Abs: 0.7 10*3/uL (ref 0.7–4.0)
MCH: 24.3 pg — ABNORMAL LOW (ref 26.0–34.0)
MCHC: 30.7 g/dL (ref 30.0–36.0)
MCV: 79.3 fL — ABNORMAL LOW (ref 80.0–100.0)
Monocytes Absolute: 0.9 10*3/uL (ref 0.1–1.0)
Monocytes Relative: 11 %
Neutro Abs: 6.2 10*3/uL (ref 1.7–7.7)
Neutrophils Relative %: 80 %
Platelets: 237 10*3/uL (ref 150–400)
RBC: 5.22 MIL/uL — ABNORMAL HIGH (ref 3.87–5.11)
RDW: 18.5 % — ABNORMAL HIGH (ref 11.5–15.5)
WBC: 7.8 10*3/uL (ref 4.0–10.5)
nRBC: 0.3 % — ABNORMAL HIGH (ref 0.0–0.2)

## 2023-04-19 LAB — BRAIN NATRIURETIC PEPTIDE: B Natriuretic Peptide: 56.1 pg/mL (ref 0.0–100.0)

## 2023-04-19 LAB — RESP PANEL BY RT-PCR (RSV, FLU A&B, COVID)  RVPGX2
Influenza A by PCR: POSITIVE — AB
Influenza B by PCR: NEGATIVE
Resp Syncytial Virus by PCR: NEGATIVE
SARS Coronavirus 2 by RT PCR: NEGATIVE

## 2023-04-19 LAB — TROPONIN I (HIGH SENSITIVITY)
Troponin I (High Sensitivity): 7 ng/L (ref <18)
Troponin I (High Sensitivity): 6 ng/L (ref <18)

## 2023-04-19 MED ORDER — IPRATROPIUM-ALBUTEROL 0.5-2.5 (3) MG/3ML IN SOLN
RESPIRATORY_TRACT | Status: AC
  Filled 2023-04-19: qty 3

## 2023-04-19 MED ORDER — OSELTAMIVIR PHOSPHATE 75 MG PO CAPS
75.0000 mg | ORAL_CAPSULE | Freq: Once | ORAL | Status: AC
  Administered 2023-04-19: 75 mg via ORAL
  Filled 2023-04-19: qty 1

## 2023-04-19 MED ORDER — OXYCODONE-ACETAMINOPHEN 5-325 MG PO TABS
1.0000 | ORAL_TABLET | Freq: Once | ORAL | Status: AC
  Administered 2023-04-19: 1 via ORAL
  Filled 2023-04-19: qty 1

## 2023-04-19 MED ORDER — IPRATROPIUM-ALBUTEROL 0.5-2.5 (3) MG/3ML IN SOLN
3.0000 mL | Freq: Once | RESPIRATORY_TRACT | Status: AC
  Administered 2023-04-19: 3 mL via RESPIRATORY_TRACT

## 2023-04-19 NOTE — ED Triage Notes (Signed)
 Patient here today with c/o chest congestion, SOB, wheeze, cough, headache, runny nose, fever, chills, sweats, and body aches since yesterday. She has been taking Mucinex with no relief. She has also taken Nyquil with no relief. No known sick contacts.

## 2023-04-19 NOTE — ED Notes (Signed)
 Patient is being discharged from the Urgent Care and sent to the Emergency Department via [facility] . Per Dr. Marlinda Mike, patient is in need of higher level of care due to hypoxia. Patient is aware and verbalizes understanding of plan of care.  Vitals:   04/19/23 1628  BP: (!) 142/67  Pulse: 82  Resp: 16  Temp: 99.9 F (37.7 C)  SpO2: (!) 88%

## 2023-04-19 NOTE — ED Triage Notes (Signed)
 Pt to ED via [facility] from UC c/o flu like symptoms that started yesterday, pt was 88%RA at UC, placed on 2L and sent to ED. Pt received  1 Deuoneb at [facility].   Last VS : 158/76, hr 82, 94% 2L, rr 21.

## 2023-04-19 NOTE — ED Notes (Signed)
 Attempted to draw blood x3 , Phlebotomy notified.

## 2023-04-19 NOTE — Discharge Instructions (Signed)
 You will be evaluated further in the emergency room for your low oxygen.

## 2023-04-19 NOTE — ED Notes (Signed)
 88%RA sat, placed back on 2L

## 2023-04-19 NOTE — ED Notes (Signed)
 Assumed pt care.

## 2023-04-19 NOTE — ED Provider Notes (Signed)
 [place] EMERGENCY DEPARTMENT AT [facility] Provider Note   CSN: [id number] Arrival date & time: 04/19/23  1828     History  Chief Complaint  Patient presents with   Shortness of Breath   flu sx    Carmen Armstrong is a 58 y.o. female.  HPI Presents for 2 days of flulike illness, cough, congestion, chest tightness with coughing and fatigue.  Patient was well prior to the onset of illness.  She has developed right hip pain since here, but this is chronic and she was unable to take today's medication as she was in the emergency department.    Home Medications Prior to Admission medications   Medication Sig Start Date End Date Taking? Authorizing Provider  amLODipine (NORVASC) 10 MG tablet Take 1 tablet (10 mg total) by mouth daily. 03/22/23   Tolia, Sunit, DO  atorvastatin (LIPITOR) 40 MG tablet Take 40 mg by mouth daily. 08/08/19   [provider]  buPROPion (WELLBUTRIN XL) 300 MG 24 hr tablet Take 300 mg by mouth every morning. 04/03/22   [provider]  candesartan (ATACAND) 4 MG tablet Take 4 mg by mouth daily. 08/13/19   [provider]  Continuous Glucose Receiver (DEXCOM G7 RECEIVER) DEVI USE AS DIRECTED TO TEST BLOOD GLUCOSE 11/07/22   [provider]  Dapagliflozin Pro-metFORMIN ER (XIGDUO XR) 06-998 MG TB24 Take 2 tablets by mouth daily. 04/13/23   Shamleffer, Konrad Dolores, MD  diphenhydrAMINE (SIMPLY SLEEP) 25 MG tablet Take 25 mg by mouth at bedtime.    [provider]  ELIQUIS 5 MG TABS tablet Take 1 tablet (5 mg total) by mouth 2 (two) times daily. 03/27/23   Tolia, Sunit, DO  FLUoxetine (PROZAC) 20 MG capsule Take 20 mg by mouth daily. 04/04/22   [provider]  gabapentin (NEURONTIN) 600 MG tablet Take 600 mg by mouth daily. 12/26/22   [provider]  insulin aspart (NOVOLOG FLEXPEN) 100 UNIT/ML FlexPen Inject 10-20 Units into the skin 3 (three) times daily with meals. 04/16/23   Shamleffer,  Konrad Dolores, MD  Insulin Degludec FlexTouch 100 UNIT/ML SOPN INJECT 64 UNITS SUBCUTANEOUSLY ONCE DAILY 04/16/23   Shamleffer, Konrad Dolores, MD  insulin glargine (LANTUS SOLOSTAR) 100 UNIT/ML Solostar Pen Inject 64 Units into the skin daily. 04/16/23   Shamleffer, Konrad Dolores, MD  insulin lispro (HUMALOG) 100 UNIT/ML KwikPen INJECT 10 TP 20 UNITS INTO THE SKIN 3 TIMES DAILY 04/16/23   Shamleffer, Konrad Dolores, MD  Insulin Pen Needle (BD PEN NEEDLE NANO U/F) 32G X 4 MM MISC 1 Device by Other route in the morning, at noon, in the evening, and at bedtime. 04/13/23   Shamleffer, Konrad Dolores, MD  isosorbide mononitrate (IMDUR) 60 MG 24 hr tablet Take 1 tablet (60 mg total) by mouth in the morning. 03/22/23   Tolia, Sunit, DO  LINZESS 145 MCG CAPS capsule Take 145 mcg by mouth daily. 04/03/22   [provider]  methimazole (TAPAZOLE) 5 MG tablet Take 1 tablet (5 mg total) by mouth as directed. 1 tablet Monday through Saturday, none on Sundays 03/20/23   Shamleffer, Konrad Dolores, MD  metoprolol succinate (TOPROL-XL) 100 MG 24 hr tablet Take 100 mg by mouth daily. 06/12/19   [provider]  morphine (MS CONTIN) 15 MG 12 hr tablet Take 15 mg by mouth 3 (three) times daily as needed for pain. 04/06/22   [provider]  naloxone [facility]) nasal spray 4 mg/0.1 mL Place 1 spray into the nose  as needed (OD).    [provider]  omeprazole (PRILOSEC) 20 MG capsule Take 20 mg by mouth daily. 06/15/21   [provider]  oxyCODONE-acetaminophen (PERCOCET/ROXICET) 5-325 MG tablet Take 0.5-1 tablets by mouth 2 (two) times daily as needed for moderate pain. 04/06/22   [provider]  spironolactone (ALDACTONE) 25 MG tablet Take 1 tablet (25 mg total) by mouth daily. 03/22/23   Tolia, Sunit, DO  tirzepatide [facility]) 10 MG/0.5ML Pen Inject 10 mg into the skin once a week. 04/13/23   Shamleffer, Konrad Dolores, MD  TRESIBA FLEXTOUCH 200 UNIT/ML FlexTouch Pen Inject  56 Units into the skin daily at 6 (six) AM. SMARTSIG:50 Unit(s) SUB-Q Daily 04/13/23   Shamleffer, Konrad Dolores, MD  XIGDUO XR 11-998 MG TB24 Take 1 tablet by mouth in the morning and at bedtime. 04/04/23   [provider]  zolpidem (AMBIEN CR) 6.25 MG CR tablet Take by mouth. 12/26/22   [provider]      Allergies    Corylus, Omeprazole, Other, Pear, Penicillin g, and Trulicity [dulaglutide]    Review of Systems   Review of Systems  Physical Exam Updated Vital Signs BP (!) 154/78 (BP Location: Right Arm)   Pulse 83   Temp 99.6 F (37.6 C) (Oral)   Resp 19   Ht 5\' 9"  (1.753 m)   Wt 99.8 kg   SpO2 98%   BMI 32.49 kg/m  Physical Exam Vitals and nursing note reviewed.  Constitutional:      Appearance: She is well-developed. She is ill-appearing.  HENT:     Head: Normocephalic and atraumatic.  Eyes:     Conjunctiva/sclera: Conjunctivae normal.  Cardiovascular:     Rate and Rhythm: Normal rate and regular rhythm.  Pulmonary:     Effort: Pulmonary effort is normal. Tachypnea present. No respiratory distress.     Breath sounds: No stridor. Decreased breath sounds and wheezing present.  Abdominal:     General: There is no distension.  Musculoskeletal:     Comments: No deformities, no change according the patient  Skin:    General: Skin is warm and dry.  Neurological:     Mental Status: She is alert and oriented to person, place, and time.     Cranial Nerves: No cranial nerve deficit.  Psychiatric:        Mood and Affect: Mood normal.     ED Results / Procedures / Treatments   Labs (all labs ordered are listed, but only abnormal results are displayed) Labs Reviewed  RESP PANEL BY RT-PCR (RSV, FLU A&B, COVID)  RVPGX2 - Abnormal; Notable for the following components:      Result Value   Influenza A by PCR POSITIVE (*)    All other components within normal limits  COMPREHENSIVE METABOLIC PANEL - Abnormal; Notable for the following components:    Glucose, Bld 187 (*)    All other components within normal limits  CBC WITH DIFFERENTIAL/PLATELET - Abnormal; Notable for the following components:   RBC 5.22 (*)    MCV 79.3 (*)    MCH 24.3 (*)    RDW 18.5 (*)    nRBC 0.3 (*)    All other components within normal limits  BRAIN NATRIURETIC PEPTIDE  TROPONIN I (HIGH SENSITIVITY)  TROPONIN I (HIGH SENSITIVITY)    EKG EKG Interpretation Date/Time:  Thursday April 19 2023 18:37:10 EST Ventricular Rate:  77 PR Interval:  162 QRS Duration:  74 QT Interval:  362 QTC Calculation: 409 R  Axis:   61  Text Interpretation: Normal sinus rhythm Artifact Otherwise within normal limits Confirmed by Gerhard Munch [phone]) on 04/19/2023 7:03:04 PM  Radiology DG Chest 2 View Result Date: 04/19/2023 CLINICAL DATA:  Dyspnea EXAM: CHEST - 2 VIEW COMPARISON:  None Available. FINDINGS: Lungs are well expanded, symmetric, and clear. No pneumothorax or pleural effusion. Cardiac size within normal limits. Pulmonary vascularity is normal. Osseous structures are age-appropriate. Dorsal column stimulator leads again noted. No acute bone abnormality. IMPRESSION: No active cardiopulmonary disease. Electronically Signed   By: Helyn Numbers M.D.   On: 04/19/2023 20:19   DG Chest 2 View Result Date: 04/19/2023 CLINICAL DATA:  Hypoxia EXAM: CHEST - 2 VIEW COMPARISON:  None Available. FINDINGS: The heart size and mediastinal contours are within normal limits. Both lungs are clear. The visualized skeletal structures are unremarkable. Dorsal column stimulator leads noted. IMPRESSION: No active cardiopulmonary disease. Electronically Signed   By: Helyn Numbers M.D.   On: 04/19/2023 20:09    Procedures Procedures    Medications Ordered in ED Medications  oxyCODONE-acetaminophen (PERCOCET/ROXICET) 5-325 MG per tablet 1 tablet (1 tablet Oral Given 04/19/23 1957)  oseltamivir (TAMIFLU) capsule 75 mg (75 mg Oral Given 04/19/23 2038)    ED Course/ Medical Decision Making/  A&P                                 Medical Decision Making Patient with multiple necrosis including insulin-dependent diabetes, neuropathy, chronic hip pain presents with flulike illness for 2 days.  She is awake, alert, not in distress, but does have supplemental oxygen which is new.  Concern for pneumonia versus viral syndrome versus heart failure given hypoxia.  Patient had labs x-ray orders. Pulse ox 99% with 2 L nasal cannula abnormal Cardiac 85 sinus normal   Amount and/or Complexity of Data Reviewed External Data Reviewed: notes.    Details: Urgent care note reviewed Labs: ordered. Decision-making details documented in ED Course. Radiology: ordered and independent interpretation performed. Decision-making details documented in ED Course. ECG/medicine tests: ordered and independent interpretation performed. Decision-making details documented in ED Course.  Risk Prescription drug management. Decision regarding hospitalization.   10:23 PM Patient aware of all findings, she continues to require oxygen to achieve normal saturation.  Findings are otherwise somewhat reassuring, no evidence for pneumonia, no leukocytosis, notes for sepsis.  However, patient does have influenza, and given her new oxygen requirement she was admitted for further monitoring, management. Final Clinical Impression(s) / ED Diagnoses Final diagnoses:  Influenza A     Gerhard Munch, MD 04/19/23 2223

## 2023-04-19 NOTE — ED Provider Notes (Signed)
 [facility]    CSN: [id number] Arrival date & time: 04/19/23  1545      History   Chief Complaint Chief Complaint  Patient presents with   Cough    HPI Carmen Armstrong is a 58 y.o. female.    Cough Here for cough and congestion and wheezing.  She has had low-grade temperatures.  Symptoms began yesterday.  She is aching all over and hurting in her chest.  It hurts worse when she breathes deep.  She has vomited once  She is allergic to penicillin and Trulicity  Past medical history significant for diabetes.  Sugar was 209 this afternoon and she has not eaten anything today  Past Medical History:  Diagnosis Date   Arrhythmia    Arthritis    Atrial fibrillation (HCC)    CAD (coronary artery disease)    Chronic lower back pain    Depression    Diabetes mellitus without complication (HCC)    Eczema    GAD (generalized anxiety disorder)    GERD (gastroesophageal reflux disease)    H/O vitamin D deficiency    Heart failure (HCC)    Hyperlipidemia    Hypertension    Hypothyroidism    Insomnia    Peripheral neuropathy    PVD (peripheral vascular disease) (HCC)     Patient Active Problem List   Diagnosis Date Noted   Type 2 diabetes mellitus with diabetic polyneuropathy, with long-term current use of insulin (HCC) 12/27/2022   Graves disease 12/27/2022   Type 2 diabetes mellitus with hyperglycemia, with long-term current use of insulin (HCC) 12/27/2022   Chronic pain 05/08/2022   Precordial pain    Pain due to onychomycosis of toenails of both feet 08/09/2020   Coagulation defect (HCC) 08/09/2020   Diabetic neuropathy (HCC) 08/09/2020    Past Surgical History:  Procedure Laterality Date   ANGIOPLASTY     legs   BACK SURGERY     5 different times   BREAST BIOPSY Right 07/24/2022   MM RT BREAST BX W LOC DEV 1ST LESION IMAGE BX SPEC STEREO GUIDE 07/24/2022 [facility]   CARDIAC CATHETERIZATION  2020   REPLACEMENT TOTAL HIP W/  RESURFACING  IMPLANTS Right 2018   RIGHT AND LEFT HEART CATH     ROTATOR CUFF REPAIR Right 2016   SPINAL CORD STIMULATOR INSERTION N/A 05/08/2022   Procedure: PLACEMENT OF SPINAL CORD STIMULATOR;  Surgeon: Venita Lick, MD;  Location: [facility];  Service: Orthopedics;  Laterality: N/A;   SPINE SURGERY  2014,2016,2017,2019   toe amputated  09/2018   5th left toe   TOE AMPUTATION Left    great toe on left side    OB History   No obstetric history on file.      Home Medications    Prior to Admission medications   Medication Sig Start Date End Date Taking? Authorizing Provider  amLODipine (NORVASC) 10 MG tablet Take 1 tablet (10 mg total) by mouth daily. 03/22/23   Tolia, Sunit, DO  atorvastatin (LIPITOR) 40 MG tablet Take 40 mg by mouth daily. 08/08/19   [provider]  buPROPion (WELLBUTRIN XL) 300 MG 24 hr tablet Take 300 mg by mouth every morning. 04/03/22   [provider]  candesartan (ATACAND) 4 MG tablet Take 4 mg by mouth daily. 08/13/19   [provider]  Continuous Glucose Receiver (DEXCOM G7 RECEIVER) DEVI USE AS DIRECTED TO TEST BLOOD GLUCOSE 11/07/22   [provider]  Dapagliflozin Pro-metFORMIN ER (XIGDUO  XR) 06-998 MG TB24 Take 2 tablets by mouth daily. 04/13/23   Shamleffer, Konrad Dolores, MD  diphenhydrAMINE (SIMPLY SLEEP) 25 MG tablet Take 25 mg by mouth at bedtime.    [provider]  ELIQUIS 5 MG TABS tablet Take 1 tablet (5 mg total) by mouth 2 (two) times daily. 03/27/23   Tolia, Sunit, DO  FLUoxetine (PROZAC) 20 MG capsule Take 20 mg by mouth daily. 04/04/22   [provider]  gabapentin (NEURONTIN) 600 MG tablet Take 600 mg by mouth daily. 12/26/22   [provider]  insulin aspart (NOVOLOG FLEXPEN) 100 UNIT/ML FlexPen Inject 10-20 Units into the skin 3 (three) times daily with meals. 04/16/23   Shamleffer, Konrad Dolores, MD  Insulin Degludec FlexTouch 100 UNIT/ML SOPN INJECT 64 UNITS SUBCUTANEOUSLY ONCE DAILY 04/16/23    Shamleffer, Konrad Dolores, MD  insulin glargine (LANTUS SOLOSTAR) 100 UNIT/ML Solostar Pen Inject 64 Units into the skin daily. 04/16/23   Shamleffer, Konrad Dolores, MD  insulin lispro (HUMALOG) 100 UNIT/ML KwikPen INJECT 10 TP 20 UNITS INTO THE SKIN 3 TIMES DAILY 04/16/23   Shamleffer, Konrad Dolores, MD  Insulin Pen Needle (BD PEN NEEDLE NANO U/F) 32G X 4 MM MISC 1 Device by Other route in the morning, at noon, in the evening, and at bedtime. 04/13/23   Shamleffer, Konrad Dolores, MD  isosorbide mononitrate (IMDUR) 60 MG 24 hr tablet Take 1 tablet (60 mg total) by mouth in the morning. 03/22/23   Tolia, Sunit, DO  LINZESS 145 MCG CAPS capsule Take 145 mcg by mouth daily. 04/03/22   [provider]  methimazole (TAPAZOLE) 5 MG tablet Take 1 tablet (5 mg total) by mouth as directed. 1 tablet Monday through Saturday, none on Sundays 03/20/23   Shamleffer, Konrad Dolores, MD  metoprolol succinate (TOPROL-XL) 100 MG 24 hr tablet Take 100 mg by mouth daily. 06/12/19   [provider]  morphine (MS CONTIN) 15 MG 12 hr tablet Take 15 mg by mouth 3 (three) times daily as needed for pain. 04/06/22   [provider]  naloxone [facility]) nasal spray 4 mg/0.1 mL Place 1 spray into the nose as needed (OD).    [provider]  omeprazole (PRILOSEC) 20 MG capsule Take 20 mg by mouth daily. 06/15/21   [provider]  oxyCODONE-acetaminophen (PERCOCET/ROXICET) 5-325 MG tablet Take 0.5-1 tablets by mouth 2 (two) times daily as needed for moderate pain. 04/06/22   [provider]  spironolactone (ALDACTONE) 25 MG tablet Take 1 tablet (25 mg total) by mouth daily. 03/22/23   Tolia, Sunit, DO  tirzepatide [facility]) 10 MG/0.5ML Pen Inject 10 mg into the skin once a week. 04/13/23   Shamleffer, Konrad Dolores, MD  TRESIBA FLEXTOUCH 200 UNIT/ML FlexTouch Pen Inject 56 Units into the skin daily at 6 (six) AM. SMARTSIG:50 Unit(s) SUB-Q Daily 04/13/23   Shamleffer, Konrad Dolores, MD   XIGDUO XR 11-998 MG TB24 Take 1 tablet by mouth in the morning and at bedtime. 04/04/23   [provider]  zolpidem (AMBIEN CR) 6.25 MG CR tablet Take by mouth. 12/26/22   [provider]    Family History Family History  Problem Relation Age of Onset   Hypertension Mother    Hyperlipidemia Mother    Diabetes Mother    Stroke Mother    Clotting disorder Father    Hypertension Father    Hyperlipidemia Father    Hyperlipidemia Sister    Diabetes Brother    Neuropathy Brother  Social History Social History   Tobacco Use   Smoking status: Every Day    Current packs/day: 0.50    Average packs/day: 0.5 packs/day for 30.0 years (15.0 ttl pk-yrs)    Types: Cigarettes   Smokeless tobacco: Never  Vaping Use   Vaping status: Never Used  Substance Use Topics   Alcohol use: Not Currently   Drug use: Not Currently     Allergies   Corylus, Omeprazole, Other, Pear, Penicillin g, and Trulicity [dulaglutide]   Review of Systems Review of Systems  Respiratory:  Positive for cough.      Physical Exam Triage Vital Signs ED Triage Vitals  Encounter Vitals Group     BP 04/19/23 1628 (!) 142/67     Systolic BP Percentile --      Diastolic BP Percentile --      Pulse Rate 04/19/23 1628 82     Resp 04/19/23 1628 16     Temp 04/19/23 1628 99.9 F (37.7 C)     Temp Source 04/19/23 1628 Oral     SpO2 04/19/23 1628 (!) 88 %     Weight 04/19/23 1629 221 lb (100.2 kg)     Height 04/19/23 1629 5\' 11"  (1.803 m)     Head Circumference --      Peak Flow --      Pain Score 04/19/23 1629 9     Pain Loc --      Pain Education --      Exclude from Growth Chart --    No data found.  Updated Vital Signs BP (!) 142/67 (BP Location: Right Arm)   Pulse 82   Temp 99.9 F (37.7 C) (Oral)   Resp 16   Ht 5\' 11"  (1.803 m)   Wt 100.2 kg   SpO2 (!) 88%   BMI 30.82 kg/m   Visual Acuity Right Eye Distance:   Left Eye Distance:   Bilateral Distance:    Right Eye  Near:   Left Eye Near:    Bilateral Near:     Physical Exam Vitals reviewed. Nursing note reviewed: O2 sat was initially 88% on room air.  Staff put her on 2 L by nasal cannula and it came up to 94%. Constitutional:      General: She is not in acute distress.    Appearance: She is not toxic-appearing.  HENT:     Nose: Congestion present.     Mouth/Throat:     Mouth: Mucous membranes are moist.     Pharynx: No oropharyngeal exudate or posterior oropharyngeal erythema.  Eyes:     Extraocular Movements: Extraocular movements intact.     Conjunctiva/sclera: Conjunctivae normal.     Pupils: Pupils are equal, round, and reactive to light.  Cardiovascular:     Rate and Rhythm: Normal rate and regular rhythm.     Heart sounds: No murmur heard. Pulmonary:     Effort: No respiratory distress.     Breath sounds: No stridor. No rhonchi or rales.     Comments: Breath sounds are reduced globally.  I think I hear some expiratory wheezes in the lower lung fields. Musculoskeletal:     Cervical back: Neck supple.  Lymphadenopathy:     Cervical: No cervical adenopathy.  Skin:    Capillary Refill: Capillary refill takes less than 2 seconds.     Coloration: Skin is not jaundiced or pale.  Neurological:     General: No focal deficit present.     Mental Status: She  is alert and oriented to person, place, and time.     Sensory: Sensory deficit: .urg.  Psychiatric:        Behavior: Behavior normal.      UC Treatments / Results  Labs (all labs ordered are listed, but only abnormal results are displayed) Labs Reviewed - No data to display  EKG   Radiology No results found.  Procedures Procedures (including critical care time)  Medications Ordered in UC Medications  ipratropium-albuterol (DUONEB) 0.5-2.5 (3) MG/3ML nebulizer solution 3 mL (3 mLs Nebulization Given 04/19/23 1651)    Initial Impression / Assessment and Plan / UC Course  I have reviewed the triage vital signs and the  nursing notes.  Pertinent labs & imaging results that were available during my care of the patient were reviewed by me and considered in my medical decision making (see chart for details).     She is given a DuoNeb treatment and chest x-ray is done.  There is possibly a hilar infiltrate on the right developing but if so it is not pronounced.  About 1 hour after the DuoNeb treatment her oxygen on 2 L was 90% and then off of oxygen it was 86% on room air.  She does not have a way to get to the emergency room.  She is sent by transport to the ER for further evaluation and treatment due to her hypoxia. Final Clinical Impressions(s) / UC Diagnoses   Final diagnoses:  Shortness of breath  Hypoxia     Discharge Instructions      You will be evaluated further in the emergency room for your low oxygen.     ED Prescriptions   None    PDMP not reviewed this encounter.   Zenia Resides, MD 04/19/23 [phone]

## 2023-04-20 ENCOUNTER — Encounter (HOSPITAL_COMMUNITY): Payer: Self-pay | Admitting: Internal Medicine

## 2023-04-20 DIAGNOSIS — I110 Hypertensive heart disease with heart failure: Secondary | ICD-10-CM | POA: Diagnosis present

## 2023-04-20 DIAGNOSIS — I480 Paroxysmal atrial fibrillation: Secondary | ICD-10-CM | POA: Diagnosis present

## 2023-04-20 DIAGNOSIS — E1151 Type 2 diabetes mellitus with diabetic peripheral angiopathy without gangrene: Secondary | ICD-10-CM | POA: Diagnosis present

## 2023-04-20 DIAGNOSIS — J9601 Acute respiratory failure with hypoxia: Secondary | ICD-10-CM

## 2023-04-20 DIAGNOSIS — E785 Hyperlipidemia, unspecified: Secondary | ICD-10-CM | POA: Insufficient documentation

## 2023-04-20 DIAGNOSIS — E0500 Thyrotoxicosis with diffuse goiter without thyrotoxic crisis or storm: Secondary | ICD-10-CM | POA: Diagnosis present

## 2023-04-20 DIAGNOSIS — F32A Depression, unspecified: Secondary | ICD-10-CM | POA: Diagnosis present

## 2023-04-20 DIAGNOSIS — J101 Influenza due to other identified influenza virus with other respiratory manifestations: Secondary | ICD-10-CM

## 2023-04-20 DIAGNOSIS — K5909 Other constipation: Secondary | ICD-10-CM | POA: Diagnosis present

## 2023-04-20 DIAGNOSIS — G8929 Other chronic pain: Secondary | ICD-10-CM | POA: Diagnosis present

## 2023-04-20 DIAGNOSIS — Z7901 Long term (current) use of anticoagulants: Secondary | ICD-10-CM | POA: Diagnosis not present

## 2023-04-20 DIAGNOSIS — I10 Essential (primary) hypertension: Secondary | ICD-10-CM | POA: Insufficient documentation

## 2023-04-20 DIAGNOSIS — Z794 Long term (current) use of insulin: Secondary | ICD-10-CM

## 2023-04-20 DIAGNOSIS — E039 Hypothyroidism, unspecified: Secondary | ICD-10-CM | POA: Diagnosis present

## 2023-04-20 DIAGNOSIS — E1142 Type 2 diabetes mellitus with diabetic polyneuropathy: Secondary | ICD-10-CM

## 2023-04-20 DIAGNOSIS — Z833 Family history of diabetes mellitus: Secondary | ICD-10-CM | POA: Diagnosis not present

## 2023-04-20 DIAGNOSIS — Z79899 Other long term (current) drug therapy: Secondary | ICD-10-CM | POA: Diagnosis not present

## 2023-04-20 DIAGNOSIS — K219 Gastro-esophageal reflux disease without esophagitis: Secondary | ICD-10-CM | POA: Insufficient documentation

## 2023-04-20 DIAGNOSIS — E1165 Type 2 diabetes mellitus with hyperglycemia: Secondary | ICD-10-CM | POA: Diagnosis present

## 2023-04-20 DIAGNOSIS — Z7985 Long-term (current) use of injectable non-insulin antidiabetic drugs: Secondary | ICD-10-CM | POA: Diagnosis not present

## 2023-04-20 DIAGNOSIS — Z7984 Long term (current) use of oral hypoglycemic drugs: Secondary | ICD-10-CM | POA: Diagnosis not present

## 2023-04-20 DIAGNOSIS — I509 Heart failure, unspecified: Secondary | ICD-10-CM | POA: Diagnosis present

## 2023-04-20 DIAGNOSIS — Z1152 Encounter for screening for COVID-19: Secondary | ICD-10-CM | POA: Diagnosis not present

## 2023-04-20 DIAGNOSIS — I2510 Atherosclerotic heart disease of native coronary artery without angina pectoris: Secondary | ICD-10-CM | POA: Diagnosis present

## 2023-04-20 DIAGNOSIS — Z8249 Family history of ischemic heart disease and other diseases of the circulatory system: Secondary | ICD-10-CM | POA: Diagnosis not present

## 2023-04-20 LAB — GLUCOSE, CAPILLARY
Glucose-Capillary: 157 mg/dL — ABNORMAL HIGH (ref 70–99)
Glucose-Capillary: 256 mg/dL — ABNORMAL HIGH (ref 70–99)
Glucose-Capillary: 269 mg/dL — ABNORMAL HIGH (ref 70–99)

## 2023-04-20 LAB — CBC
HCT: 40.7 % (ref 36.0–46.0)
Hemoglobin: 12.6 g/dL (ref 12.0–15.0)
MCH: 24 pg — ABNORMAL LOW (ref 26.0–34.0)
MCHC: 31 g/dL (ref 30.0–36.0)
MCV: 77.7 fL — ABNORMAL LOW (ref 80.0–100.0)
Platelets: 210 10*3/uL (ref 150–400)
RBC: 5.24 MIL/uL — ABNORMAL HIGH (ref 3.87–5.11)
RDW: 18.3 % — ABNORMAL HIGH (ref 11.5–15.5)
WBC: 6.8 10*3/uL (ref 4.0–10.5)
nRBC: 0.3 % — ABNORMAL HIGH (ref 0.0–0.2)

## 2023-04-20 LAB — COMPREHENSIVE METABOLIC PANEL
ALT: 15 U/L (ref 0–44)
AST: 19 U/L (ref 15–41)
Albumin: 3.4 g/dL — ABNORMAL LOW (ref 3.5–5.0)
Alkaline Phosphatase: 91 U/L (ref 38–126)
Anion gap: 7 (ref 5–15)
BUN: 11 mg/dL (ref 6–20)
CO2: 23 mmol/L (ref 22–32)
Calcium: 8.6 mg/dL — ABNORMAL LOW (ref 8.9–10.3)
Chloride: 104 mmol/L (ref 98–111)
Creatinine, Ser: 0.84 mg/dL (ref 0.44–1.00)
GFR, Estimated: >60 mL/min (ref >60)
Glucose, Bld: 137 mg/dL — ABNORMAL HIGH (ref 70–99)
Potassium: 3.9 mmol/L (ref 3.5–5.1)
Sodium: 134 mmol/L — ABNORMAL LOW (ref 135–145)
Total Bilirubin: 0.9 mg/dL (ref 0.0–1.2)
Total Protein: 7.1 g/dL (ref 6.5–8.1)

## 2023-04-20 LAB — HIV ANTIBODY (ROUTINE TESTING W REFLEX): HIV Screen 4th Generation wRfx: NONREACTIVE

## 2023-04-20 LAB — CBG MONITORING, ED: Glucose-Capillary: 138 mg/dL — ABNORMAL HIGH (ref 70–99)

## 2023-04-20 MED ORDER — METOPROLOL SUCCINATE ER 100 MG PO TB24
100.0000 mg | ORAL_TABLET | Freq: Every day | ORAL | Status: DC
  Administered 2023-04-20 – 2023-04-21 (×2): 100 mg via ORAL
  Filled 2023-04-20 (×2): qty 1

## 2023-04-20 MED ORDER — OSELTAMIVIR PHOSPHATE 75 MG PO CAPS
75.0000 mg | ORAL_CAPSULE | Freq: Two times a day (BID) | ORAL | Status: DC
  Administered 2023-04-20 – 2023-04-21 (×3): 75 mg via ORAL
  Filled 2023-04-20 (×5): qty 1

## 2023-04-20 MED ORDER — IRBESARTAN 75 MG PO TABS
75.0000 mg | ORAL_TABLET | Freq: Every day | ORAL | Status: DC
  Administered 2023-04-20 – 2023-04-21 (×2): 75 mg via ORAL
  Filled 2023-04-20 (×3): qty 1

## 2023-04-20 MED ORDER — BUPROPION HCL ER (XL) 150 MG PO TB24
300.0000 mg | ORAL_TABLET | Freq: Every morning | ORAL | Status: DC
  Administered 2023-04-21: 300 mg via ORAL
  Filled 2023-04-20 (×2): qty 2

## 2023-04-20 MED ORDER — INSULIN ASPART 100 UNIT/ML IJ SOLN: 0.0000 [IU] | Freq: Three times a day (TID) | INTRAMUSCULAR | Status: DC

## 2023-04-20 MED ORDER — INSULIN ASPART 100 UNIT/ML IJ SOLN
0.0000 [IU] | Freq: Every day | INTRAMUSCULAR | Status: DC
  Administered 2023-04-20: 3 [IU] via SUBCUTANEOUS

## 2023-04-20 MED ORDER — BUDESONIDE 0.25 MG/2ML IN SUSP
0.2500 mg | Freq: Two times a day (BID) | RESPIRATORY_TRACT | Status: DC
  Administered 2023-04-20 (×3): 0.25 mg via RESPIRATORY_TRACT
  Filled 2023-04-20 (×4): qty 2

## 2023-04-20 MED ORDER — FLUOXETINE HCL 20 MG PO CAPS
20.0000 mg | ORAL_CAPSULE | Freq: Every day | ORAL | Status: DC
  Administered 2023-04-20 – 2023-04-21 (×2): 20 mg via ORAL
  Filled 2023-04-20 (×2): qty 1

## 2023-04-20 MED ORDER — IPRATROPIUM-ALBUTEROL 0.5-2.5 (3) MG/3ML IN SOLN
3.0000 mL | RESPIRATORY_TRACT | Status: DC | PRN
  Filled 2023-04-20: qty 3

## 2023-04-20 MED ORDER — DM-GUAIFENESIN ER 30-600 MG PO TB12: 1.0000 | ORAL_TABLET | Freq: Two times a day (BID) | ORAL | Status: DC

## 2023-04-20 MED ORDER — MORPHINE SULFATE ER 15 MG PO TBCR
15.0000 mg | EXTENDED_RELEASE_TABLET | Freq: Three times a day (TID) | ORAL | Status: DC | PRN
  Administered 2023-04-20 (×2): 15 mg via ORAL
  Filled 2023-04-20 (×2): qty 1

## 2023-04-20 MED ORDER — INSULIN ASPART 100 UNIT/ML IJ SOLN
0.0000 [IU] | Freq: Three times a day (TID) | INTRAMUSCULAR | Status: DC
  Administered 2023-04-20: 5 [IU] via SUBCUTANEOUS
  Administered 2023-04-20: 2 [IU] via SUBCUTANEOUS
  Administered 2023-04-21: 9 [IU] via SUBCUTANEOUS
  Administered 2023-04-21: 3 [IU] via SUBCUTANEOUS

## 2023-04-20 MED ORDER — ORAL CARE MOUTH RINSE: 15.0000 mL | OROMUCOSAL | Status: DC | PRN

## 2023-04-20 MED ORDER — INSULIN GLARGINE 100 UNIT/ML ~~LOC~~ SOLN
30.0000 [IU] | Freq: Every day | SUBCUTANEOUS | Status: DC
  Administered 2023-04-20 – 2023-04-21 (×2): 30 [IU] via SUBCUTANEOUS
  Filled 2023-04-20 (×2): qty 0.3

## 2023-04-20 MED ORDER — ENSURE ENLIVE PO LIQD
237.0000 mL | Freq: Two times a day (BID) | ORAL | Status: DC
  Administered 2023-04-20 – 2023-04-21 (×2): 237 mL via ORAL

## 2023-04-20 MED ORDER — ACETAMINOPHEN 650 MG RE SUPP: 650.0000 mg | Freq: Four times a day (QID) | RECTAL | Status: DC | PRN

## 2023-04-20 MED ORDER — GUAIFENESIN ER 600 MG PO TB12
600.0000 mg | ORAL_TABLET | Freq: Two times a day (BID) | ORAL | Status: DC
Start: 2023-04-20 — End: 2023-04-21
  Administered 2023-04-20 – 2023-04-21 (×3): 600 mg via ORAL
  Filled 2023-04-20 (×3): qty 1

## 2023-04-20 MED ORDER — ACETAMINOPHEN 325 MG PO TABS: 650.0000 mg | ORAL_TABLET | Freq: Four times a day (QID) | ORAL | Status: DC | PRN

## 2023-04-20 MED ORDER — GABAPENTIN 300 MG PO CAPS
600.0000 mg | ORAL_CAPSULE | Freq: Every day | ORAL | Status: DC
  Administered 2023-04-20 – 2023-04-21 (×2): 600 mg via ORAL
  Filled 2023-04-20 (×2): qty 2

## 2023-04-20 MED ORDER — METHIMAZOLE 5 MG PO TABS
5.0000 mg | ORAL_TABLET | ORAL | Status: DC
  Administered 2023-04-20 – 2023-04-21 (×2): 5 mg via ORAL
  Filled 2023-04-20 (×3): qty 1

## 2023-04-20 MED ORDER — ATORVASTATIN CALCIUM 40 MG PO TABS
40.0000 mg | ORAL_TABLET | Freq: Every day | ORAL | Status: DC
  Administered 2023-04-20 – 2023-04-21 (×2): 40 mg via ORAL
  Filled 2023-04-20 (×2): qty 1

## 2023-04-20 MED ORDER — IPRATROPIUM-ALBUTEROL 0.5-2.5 (3) MG/3ML IN SOLN
3.0000 mL | RESPIRATORY_TRACT | Status: DC
  Administered 2023-04-20 – 2023-04-21 (×7): 3 mL via RESPIRATORY_TRACT
  Filled 2023-04-20 (×7): qty 3

## 2023-04-20 MED ORDER — LINACLOTIDE 145 MCG PO CAPS
145.0000 ug | ORAL_CAPSULE | Freq: Every day | ORAL | Status: DC
  Administered 2023-04-20 – 2023-04-21 (×2): 145 ug via ORAL
  Filled 2023-04-20 (×2): qty 1

## 2023-04-20 MED ORDER — INSULIN GLARGINE 100 UNIT/ML ~~LOC~~ SOLN
64.0000 [IU] | Freq: Every day | SUBCUTANEOUS | Status: DC
  Filled 2023-04-20: qty 0.64

## 2023-04-20 MED ORDER — SPIRONOLACTONE 25 MG PO TABS
25.0000 mg | ORAL_TABLET | Freq: Every day | ORAL | Status: DC
  Administered 2023-04-20 – 2023-04-21 (×2): 25 mg via ORAL
  Filled 2023-04-20 (×2): qty 1

## 2023-04-20 MED ORDER — APIXABAN 5 MG PO TABS
5.0000 mg | ORAL_TABLET | Freq: Two times a day (BID) | ORAL | Status: DC
  Administered 2023-04-20 – 2023-04-21 (×3): 5 mg via ORAL
  Filled 2023-04-20 (×3): qty 1

## 2023-04-20 MED ORDER — PANTOPRAZOLE SODIUM 40 MG PO TBEC
40.0000 mg | DELAYED_RELEASE_TABLET | Freq: Every day | ORAL | Status: DC
  Administered 2023-04-20 – 2023-04-21 (×2): 40 mg via ORAL
  Filled 2023-04-20 (×2): qty 1

## 2023-04-20 MED ORDER — DIPHENHYDRAMINE HCL 25 MG PO CAPS
25.0000 mg | ORAL_CAPSULE | Freq: Every day | ORAL | Status: DC
  Administered 2023-04-20: 25 mg via ORAL
  Filled 2023-04-20: qty 1

## 2023-04-20 MED ORDER — AMLODIPINE BESYLATE 10 MG PO TABS
10.0000 mg | ORAL_TABLET | Freq: Every day | ORAL | Status: DC
  Administered 2023-04-20 – 2023-04-21 (×2): 10 mg via ORAL
  Filled 2023-04-20: qty 2
  Filled 2023-04-20: qty 1

## 2023-04-20 MED ORDER — INSULIN ASPART 100 UNIT/ML IJ SOLN: 10.0000 [IU] | Freq: Three times a day (TID) | INTRAMUSCULAR | Status: DC

## 2023-04-20 MED ORDER — ISOSORBIDE MONONITRATE ER 60 MG PO TB24
60.0000 mg | ORAL_TABLET | Freq: Every morning | ORAL | Status: DC
  Administered 2023-04-20 – 2023-04-21 (×2): 60 mg via ORAL
  Filled 2023-04-20: qty 2
  Filled 2023-04-20: qty 1

## 2023-04-20 NOTE — ED Notes (Signed)
 Pt otf with transport, in no new onset distress at time.

## 2023-04-20 NOTE — Progress Notes (Signed)
 PROGRESS NOTE  Carmen Armstrong  DOB: 11-10-1965  PCP: Lewis Moccasin, MD BJY:[id number]  DOA: 04/19/2023  LOS: 0 days  Hospital Day: 2  Brief narrative: Carmen Armstrong is a 58 y.o. female with PMH significant for DM2, HTN, HLD, A-fib, CHF, CAD, PAD, neuropathy, hypERthyroidism, arthritis, anxiety/depression. Lives at home alone.  Independent. 3/6, patient presented to the ED with complaint of shortness of breath, congestion, cough, runny nose, fevers, chills for 2 days, noted with Mucinex or NyQuil. She was seen at PCPs office, noted to be hypoxic and required 2 L oxygen by nasal Carmen Armstrong 89 and sent to ED  In the ED, she had a low-grade temperature 99.6, blood pressure 154/88,, on 2 L of significant Labs with WC count 7.8, hemoglobin 12.7 Respiratory virus panel positive for influenza A Chest x-ray unremarkable Patient was started on Tamiflu Admitted to [facility]  Subjective: Patient was seen and examined this morning. Chart reviewed. Overnight, no fever, blood pressure was elevated 170s, in 130s this morning, Labs from this morning with sodium 134  Assessment and plan: Acute respiratory with hypoxia  Influenza A  Presented with URI symptoms, shortness of breath,  was hypoxic in PCPs office and required 2 L oxygen Influenza A positive. Chest x-ray unremarkable Started on Tamiflu, DuoNeb Add Mucinex DM, incentive spirometry Encourage ambulation. Check ambulatory oxygen requirement Recent Labs  Lab 04/19/23 2034 04/20/23 0414  WBC 7.8 6.8   Type 2 diabetes mellitus uncontrolled A1c 13.1 04/07/2023 PTA meds-Lantus 64 units, Premeal insulin, dapagliflozin 5 mg daily, metformin 1000 mg daily.. recently switched to [facility] by endocrinologist which she had not started yet Currently blood sugar level is still elevated.  Resume Lantus at lower dose of 30 units this morning.  Start SSI/Accu-Cheks Lab Results  Component Value Date   HGBA1C 13.1 (A) 04/13/2023   Recent  Labs  Lab 04/20/23 0803  GLUCAP 138*    Hypertension PTA meds- Toprol 100 mg daily, Imdur 60 mg daily, amlodipine 10 mg daily, candesartan 4 mg daily, Aldactone 25 mg daily Currently continued on all. Continue to monitor blood pressure IV hydralazine as needed  CAD, PAD, HLD Eliquis, Lipitor  Paroxysmal A-fib Metoprolol and Eliquis  HypERthyroidism Methimazole  GERD PPI  Anxiety/depression Peripheral neuropathy Wellbutrin 300 mg daily and Prozac 20 mg daily Neurontin 600 mg daily  Chronic back pain  Chronic constipation Patient reports chronic back pain, has had 6 different surgeries.  Seems to be on MS Contin 15 mg twice daily, Percocet as needed Continue Linzess for constipation   Mobility: Encourage ambulation  Goals of care   Code Status: Full Code     DVT prophylaxis:   apixaban (ELIQUIS) tablet 5 mg   Antimicrobials: Tamiflu Fluid: None Consultants: None Family Communication: None at bedside  Status: Observation Level of care:  Telemetry Medical   Patient is from: Home Needs to continue in-hospital care: Pending clinical course Anticipated d/c to: Hopefully home in 1 to 2 days      Diet:  Diet Order             Diet heart healthy/carb modified Room service appropriate? Yes; Fluid consistency: Thin  Diet effective now                   Scheduled Meds:  amLODipine  10 mg Oral Daily   apixaban  5 mg Oral BID   atorvastatin  40 mg Oral Daily   budesonide (PULMICORT) nebulizer solution  0.25 mg Nebulization BID   buPROPion  300 mg Oral q morning   diphenhydrAMINE  25 mg Oral QHS   FLUoxetine  20 mg Oral Daily   gabapentin  600 mg Oral Daily   guaiFENesin  600 mg Oral BID   insulin aspart  0-5 Units Subcutaneous QHS   insulin aspart  0-9 Units Subcutaneous TID WC   insulin glargine  30 Units Subcutaneous Daily   ipratropium-albuterol  3 mL Nebulization Q4H   irbesartan  75 mg Oral Daily   isosorbide mononitrate  60 mg Oral q AM    linaclotide  145 mcg Oral Daily   methimazole  5 mg Oral Once per day on Monday Tuesday Wednesday Thursday Friday Saturday   metoprolol succinate  100 mg Oral Daily   oseltamivir  75 mg Oral BID   pantoprazole  40 mg Oral Daily   spironolactone  25 mg Oral Daily    PRN meds: acetaminophen **OR** acetaminophen, ipratropium-albuterol, morphine   Infusions:    Antimicrobials: Anti-infectives (From admission, onward)    Start     Dose/Rate Route Frequency Ordered Stop   04/20/23 0600  oseltamivir (TAMIFLU) capsule 75 mg        75 mg Oral 2 times daily 04/20/23 0139 04/24/23 2159   04/19/23 2045  oseltamivir (TAMIFLU) capsule 75 mg        75 mg Oral  Once 04/19/23 2032 04/19/23 2038       Objective: Vitals:   04/20/23 1007 04/20/23 1016  BP: (!) 146/77 (!) 150/70  Pulse: 78 79  Resp: (!) 22   Temp: 98.8 F (37.1 C) 99 F (37.2 C)  SpO2: 93% 96%   No intake or output data in the 24 hours ending 04/20/23 1052 Filed Weights   04/19/23 1833 04/20/23 1016  Weight: 99.8 kg 98 kg   Weight change:  Body mass index is 31.9 kg/m.   Physical Exam: General exam: Pleasant, middle-aged African-American female Skin: No rashes, lesions or ulcers. HEENT: Atraumatic, normocephalic, no obvious bleeding Lungs: Mild scattered rhonchi, coughs on deep breathing CVS: S1, S2, no murmur,   GI/Abd: Soft, nontender, nondistended, bowel sound present,   CNS: Alert, awake, oriented x 3 Psychiatry: Mood appropriate,  Extremities: No pedal edema, no calf tenderness,   Data Review: I have personally reviewed the laboratory data and studies available.  F/u labs ordered Unresulted Labs (From admission, onward)     Start     Ordered   04/20/23 0134  HIV Antibody (routine testing w rflx)  (HIV Antibody (Routine testing w reflex) panel)  Once,   R        04/20/23 0135            Total time spent in review of labs and imaging, patient evaluation, formulation of plan, documentation and  communication with family: 45 minutes  Signed, Lorin Glass, MD [facility] 04/20/2023

## 2023-04-20 NOTE — Inpatient Diabetes Management (Signed)
 Inpatient Diabetes Program Recommendations  AACE/ADA: New Consensus Statement on Inpatient Glycemic Control (2015)  Target Ranges:  Prepandial:   less than 140 mg/dL      Peak postprandial:   less than 180 mg/dL (1-2 hours)      Critically ill patients:  140 - 180 mg/dL   Lab Results  Component Value Date   GLUCAP 157 (H) 04/20/2023   HGBA1C 13.1 (A) 04/13/2023    Review of Glycemic Control  Diabetes history: type 2  Outpatient Diabetes medications: Lantus 64 units daily, Novolog 10-20 units scale TID, Xigduo XR 06-998 mg 2 tabs daily, Mounjaro 10 mg weekly (off for 5 weeks.) Current orders for Inpatient glycemic control: Lantus 30 units at HS, Novolog 0-9 units correction scale TID, Novolog 0-5 units HS scale  Inpatient Diabetes Program Recommendations:   Spoke with patient on the phone to confirm her medications that she takes for her diabetes. Patient states that she takes Lantus and Novolog (as listed above), Xigduo XR 06-998 mg 2 tabs daily. She had been taking Mounjaro in the past, but her pain medicine physician took her off of it about 5 weeks ago. She noted that her blood sugars were beginning to rise between 250-400 mg/dl during that time. She had a HgbA1C of 9.8% in November, 2024 when she had her endocrinology visit with Dr. Lonzo Cloud. She was able to see Dr. Lonzo Cloud earlier this week about her high blood sugars and to have her adjust her medications and start the [facility] back. Patient expected her HgbA1C to be elevated. Discussed the result of 13.1% on 04/13/23.   Patient seems very anxious to get her blood sugars under control. Will continue to monitor blood sugars while in the hospital.   Smith Mince RN BSN CDE Diabetes Coordinator Pager: [phone]  8am-5pm

## 2023-04-20 NOTE — Plan of Care (Signed)
  Problem: Education: Goal: Ability to describe self-care measures that may prevent or decrease complications (Diabetes Survival Skills Education) will improve Outcome: Progressing Goal: Individualized Educational Video(s) Outcome: Progressing   Problem: Coping: Goal: Ability to adjust to condition or change in health will improve Outcome: Progressing   Problem: Fluid Volume: Goal: Ability to maintain a balanced intake and output will improve Outcome: Progressing   Problem: Health Behavior/Discharge Planning: Goal: Ability to identify and utilize available resources and services will improve Outcome: Progressing Goal: Ability to manage health-related needs will improve Outcome: Progressing   Problem: Metabolic: Goal: Ability to maintain appropriate glucose levels will improve Outcome: Progressing   Problem: Nutritional: Goal: Maintenance of adequate nutrition will improve Outcome: Progressing Goal: Progress toward achieving an optimal weight will improve Outcome: Progressing   Problem: Skin Integrity: Goal: Risk for impaired skin integrity will decrease Outcome: Progressing   Problem: Tissue Perfusion: Goal: Adequacy of tissue perfusion will improve Outcome: Progressing   Problem: Education: Goal: Knowledge of General Education information will improve Description: Including pain rating scale, medication(s)/side effects and non-pharmacologic comfort measures Outcome: Progressing   Problem: Health Behavior/Discharge Planning: Goal: Ability to manage health-related needs will improve Outcome: Progressing   Problem: Clinical Measurements: Goal: Ability to maintain clinical measurements within normal limits will improve Outcome: Progressing Goal: Will remain free from infection Outcome: Progressing Goal: Diagnostic test results will improve Outcome: Progressing Goal: Cardiovascular complication will be avoided Outcome: Progressing   Problem: Activity: Goal: Risk  for activity intolerance will decrease Outcome: Progressing   Problem: Nutrition: Goal: Adequate nutrition will be maintained Outcome: Progressing   Problem: Coping: Goal: Level of anxiety will decrease Outcome: Progressing   Problem: Elimination: Goal: Will not experience complications related to bowel motility Outcome: Progressing Goal: Will not experience complications related to urinary retention Outcome: Progressing   Problem: Pain Managment: Goal: General experience of comfort will improve and/or be controlled Outcome: Progressing   Problem: Safety: Goal: Ability to remain free from injury will improve Outcome: Progressing   Problem: Skin Integrity: Goal: Risk for impaired skin integrity will decrease Outcome: Progressing   Problem: Clinical Measurements: Goal: Respiratory complications will improve Outcome: Not Progressing  Using incentive spirometer well. Smoker 1/2 ppd. On oxgyen here 2 liters

## 2023-04-20 NOTE — Progress Notes (Signed)
 NEW ADMISSION NOTE New Admission Note:   Arrival Method: walked from stretcher to bed, uses cane for long distances Mental Orientation: alert and oriented x 4 Telemetry: NSR Assessment: Completed Skin: intact IV: PIV in left arm Pain: see notes, chronic Tubes: none Safety Measures: Safety Fall Prevention Plan has been given, discussed and signed Admission: Completed [facility] Orientation: Patient has been orientated to the room, unit and staff.  Family: none at bedside, pt has notified family of new room on floor  Orders have been reviewed and implemented. Will continue to monitor the patient. Call light has been placed within reach and bed alarm has been activated.   Will attempt ambulation p pain medication given/due. Does not use oxygen at home. On 2 liters at present.   Irwin Brakeman, RN

## 2023-04-20 NOTE — H&P (Signed)
 History and Physical    Carmen Armstrong ZOX:[id number] DOB: [birth year] DOA: 04/19/2023  Patient coming from: Home.  Chief Complaint: Shortness of breath and cough.  HPI: Carmen Armstrong is a 58 y.o. female with history of diabetes mellitus type 2, hypertension, hyperlipidemia, chronic pain, paroxysmal atrial fibrillation, hyperthyroidism has been experiencing cough productive of whitish sputum for the last 2 days with shortness of breath.  Had 1 episode of nausea vomiting denies any abdominal pain.  Has been having generalized body ache.  Denies any chest pain but had some subjective feeling of fever chills.  Patient had visited her primary care physician was found to be wheezing and was hypoxic requiring 2 L oxygen was referred to the ER.  ED Course: In the ER patient was tested positive for influenza A.  Chest x-ray was unremarkable.  Was started on Tamiflu on nebulizer and still requiring 2 L oxygen admitted for further observation.  Labs show troponins were 7 and 8 BNP 56 WBC 7.8 creatinine 0.9.  Admitted for acute respiratory failure hypoxia in the setting of influenza A.  Review of Systems: As per HPI, rest all negative.   Past Medical History:  Diagnosis Date   Arrhythmia    Arthritis    Atrial fibrillation (HCC)    CAD (coronary artery disease)    Chronic lower back pain    Depression    Diabetes mellitus without complication (HCC)    Eczema    GAD (generalized anxiety disorder)    GERD (gastroesophageal reflux disease)    H/O vitamin D deficiency    Heart failure (HCC)    Hyperlipidemia    Hypertension    Hypothyroidism    Insomnia    Peripheral neuropathy    PVD (peripheral vascular disease) (HCC)     Past Surgical History:  Procedure Laterality Date   ANGIOPLASTY     legs   BACK SURGERY     5 different times   BREAST BIOPSY Right 07/24/2022   MM RT BREAST BX W LOC DEV 1ST LESION IMAGE BX SPEC STEREO GUIDE 07/24/2022 [facility]   CARDIAC  CATHETERIZATION  2020   REPLACEMENT TOTAL HIP W/  RESURFACING IMPLANTS Right 2018   RIGHT AND LEFT HEART CATH     ROTATOR CUFF REPAIR Right 2016   SPINAL CORD STIMULATOR INSERTION N/A 05/08/2022   Procedure: PLACEMENT OF SPINAL CORD STIMULATOR;  Surgeon: Venita Lick, MD;  Location: [facility] OR;  Service: Orthopedics;  Laterality: N/A;   SPINE SURGERY  2014,2016,2017,2019   toe amputated  09/2018   5th left toe   TOE AMPUTATION Left    great toe on left side     reports that she has been smoking cigarettes. She has a 15 pack-year smoking history. She has never used smokeless tobacco. She reports that she does not currently use alcohol. She reports that she does not currently use drugs.  Allergies  Allergen Reactions   Corylus    Omeprazole     Other reaction(s): Not available   Other     Hazelnuts- SOB   Pear    Penicillin G     whelps   Trulicity [Dulaglutide]     Large itchy bumps    Family History  Problem Relation Age of Onset   Hypertension Mother    Hyperlipidemia Mother    Diabetes Mother    Stroke Mother    Clotting disorder Father    Hypertension Father    Hyperlipidemia Father    Hyperlipidemia Sister  Diabetes Brother    Neuropathy Brother     Prior to Admission medications   Medication Sig Start Date End Date Taking? Authorizing Provider  amLODipine (NORVASC) 10 MG tablet Take 1 tablet (10 mg total) by mouth daily. 03/22/23   Tolia, Sunit, DO  atorvastatin (LIPITOR) 40 MG tablet Take 40 mg by mouth daily. 08/08/19   [provider]  buPROPion (WELLBUTRIN XL) 300 MG 24 hr tablet Take 300 mg by mouth every morning. 04/03/22   [provider]  candesartan (ATACAND) 4 MG tablet Take 4 mg by mouth daily. 08/13/19   [provider]  Continuous Glucose Receiver (DEXCOM G7 RECEIVER) DEVI USE AS DIRECTED TO TEST BLOOD GLUCOSE 11/07/22   [provider]  Dapagliflozin Pro-metFORMIN ER (XIGDUO XR) 06-998 MG TB24 Take 2 tablets by mouth  daily. 04/13/23   Shamleffer, Konrad Dolores, MD  diphenhydrAMINE (SIMPLY SLEEP) 25 MG tablet Take 25 mg by mouth at bedtime.    [provider]  ELIQUIS 5 MG TABS tablet Take 1 tablet (5 mg total) by mouth 2 (two) times daily. 03/27/23   Tolia, Sunit, DO  FLUoxetine (PROZAC) 20 MG capsule Take 20 mg by mouth daily. 04/04/22   [provider]  gabapentin (NEURONTIN) 600 MG tablet Take 600 mg by mouth daily. 12/26/22   [provider]  insulin aspart (NOVOLOG FLEXPEN) 100 UNIT/ML FlexPen Inject 10-20 Units into the skin 3 (three) times daily with meals. 04/16/23   Shamleffer, Konrad Dolores, MD  Insulin Degludec FlexTouch 100 UNIT/ML SOPN INJECT 64 UNITS SUBCUTANEOUSLY ONCE DAILY 04/16/23   Shamleffer, Konrad Dolores, MD  insulin glargine (LANTUS SOLOSTAR) 100 UNIT/ML Solostar Pen Inject 64 Units into the skin daily. 04/16/23   Shamleffer, Konrad Dolores, MD  insulin lispro (HUMALOG) 100 UNIT/ML KwikPen INJECT 10 TP 20 UNITS INTO THE SKIN 3 TIMES DAILY 04/16/23   Shamleffer, Konrad Dolores, MD  Insulin Pen Needle (BD PEN NEEDLE NANO U/F) 32G X 4 MM MISC 1 Device by Other route in the morning, at noon, in the evening, and at bedtime. 04/13/23   Shamleffer, Konrad Dolores, MD  isosorbide mononitrate (IMDUR) 60 MG 24 hr tablet Take 1 tablet (60 mg total) by mouth in the morning. 03/22/23   Tolia, Sunit, DO  LINZESS 145 MCG CAPS capsule Take 145 mcg by mouth daily. 04/03/22   [provider]  methimazole (TAPAZOLE) 5 MG tablet Take 1 tablet (5 mg total) by mouth as directed. 1 tablet Monday through Saturday, none on Sundays 03/20/23   Shamleffer, Konrad Dolores, MD  metoprolol succinate (TOPROL-XL) 100 MG 24 hr tablet Take 100 mg by mouth daily. 06/12/19   [provider]  morphine (MS CONTIN) 15 MG 12 hr tablet Take 15 mg by mouth 3 (three) times daily as needed for pain. 04/06/22   [provider]  naloxone [facility]) nasal spray 4 mg/0.1 mL Place 1 spray into the  nose as needed (OD).    [provider]  omeprazole (PRILOSEC) 20 MG capsule Take 20 mg by mouth daily. 06/15/21   [provider]  oxyCODONE-acetaminophen (PERCOCET/ROXICET) 5-325 MG tablet Take 0.5-1 tablets by mouth 2 (two) times daily as needed for moderate pain. 04/06/22   [provider]  spironolactone (ALDACTONE) 25 MG tablet Take 1 tablet (25 mg total) by mouth daily. 03/22/23   Tolia, Sunit, DO  tirzepatide [facility]) 10 MG/0.5ML Pen Inject 10 mg into the skin once a week. 04/13/23   Shamleffer, Konrad Dolores, MD  TRESIBA FLEXTOUCH 200  UNIT/ML FlexTouch Pen Inject 56 Units into the skin daily at 6 (six) AM. SMARTSIG:50 Unit(s) SUB-Q Daily 04/13/23   Shamleffer, Konrad Dolores, MD  XIGDUO XR 11-998 MG TB24 Take 1 tablet by mouth in the morning and at bedtime. 04/04/23   [provider]  zolpidem (AMBIEN CR) 6.25 MG CR tablet Take by mouth. 12/26/22   [provider]    Physical Exam: Constitutional: Moderately built and nourished. Vitals:   04/19/23 2256 04/19/23 2315 04/19/23 2329 04/20/23 0000  BP: (!) 167/76 (!) 177/85  (!) 179/78  Pulse: 89 91    Resp: 20 20  (!) 37  Temp:   98.7 F (37.1 C)   TempSrc:   Oral   SpO2: 92% 97%    Weight:      Height:       Eyes: Anicteric no pallor. ENMT: No discharge from the ears eyes nose or mouth. Neck: No mass felt.  No neck rigidity. Respiratory: Bilateral air entry appears tight. Cardiovascular: S1-S2 heard. Abdomen: Soft nontender bowel sound present. Musculoskeletal: No edema. Skin: No rash. Neurologic: Alert awake oriented to time place and person.  Moves all extremities. Psychiatric: Appears normal.  Normal affect.   Labs on Admission: I have personally reviewed following labs and imaging studies  CBC: Recent Labs  Lab 04/19/23 2034  WBC 7.8  NEUTROABS 6.2  HGB 12.7  HCT 41.4  MCV 79.3*  PLT 237   Basic Metabolic Panel: Recent Labs  Lab 04/19/23 2034  NA 141  K 4.6   CL 102  CO2 24  GLUCOSE 187*  BUN 9  CREATININE 0.95  CALCIUM 9.6   GFR: Estimated Creatinine Clearance: 81.1 mL/min (by C-G formula based on SCr of 0.95 mg/dL). Liver Function Tests: Recent Labs  Lab 04/19/23 2034  AST 19  ALT 15  ALKPHOS 95  BILITOT 0.9  PROT 7.2  ALBUMIN 3.6   No results for input(s): "LIPASE", "AMYLASE" in the last 168 hours. No results for input(s): "AMMONIA" in the last 168 hours. Coagulation Profile: No results for input(s): "INR", "PROTIME" in the last 168 hours. Cardiac Enzymes: No results for input(s): "CKTOTAL", "CKMB", "CKMBINDEX", "TROPONINI" in the last 168 hours. BNP (last 3 results) No results for input(s): "PROBNP" in the last 8760 hours. HbA1C: No results for input(s): "HGBA1C" in the last 72 hours. CBG: No results for input(s): "GLUCAP" in the last 168 hours. Lipid Profile: No results for input(s): "CHOL", "HDL", "LDLCALC", "TRIG", "CHOLHDL", "LDLDIRECT" in the last 72 hours. Thyroid Function Tests: No results for input(s): "TSH", "T4TOTAL", "FREET4", "T3FREE", "THYROIDAB" in the last 72 hours. Anemia Panel: No results for input(s): "VITAMINB12", "FOLATE", "FERRITIN", "TIBC", "IRON", "RETICCTPCT" in the last 72 hours. Urine analysis:    Component Value Date/Time   COLORURINE YELLOW 12/03/2022 1143   APPEARANCEUR CLEAR 12/03/2022 1143   LABSPEC 1.025 12/03/2022 1143   PHURINE 6.0 12/03/2022 1143   GLUCOSEU >=500 (A) 12/03/2022 1143   HGBUR NEGATIVE 12/03/2022 1143   BILIRUBINUR NEGATIVE 12/03/2022 1143   KETONESUR NEGATIVE 12/03/2022 1143   PROTEINUR NEGATIVE 12/03/2022 1143   NITRITE NEGATIVE 12/03/2022 1143   LEUKOCYTESUR NEGATIVE 12/03/2022 1143   Sepsis Labs: @LABRCNTIP (procalcitonin:4,lacticidven:4) ) Recent Results (from the past 240 hours)  Resp panel by RT-PCR (RSV, Flu A&B, Covid) Anterior Nasal Swab     Status: Abnormal   Collection Time: 04/19/23  6:38 PM   Specimen: Anterior Nasal Swab  Result Value Ref Range  Status   SARS Coronavirus 2 by RT PCR NEGATIVE NEGATIVE  Final   Influenza A by PCR POSITIVE (A) NEGATIVE Final   Influenza B by PCR NEGATIVE NEGATIVE Final    Comment: (NOTE) The Xpert Xpress SARS-CoV-2/FLU/RSV plus assay is intended as an aid in the diagnosis of influenza from Nasopharyngeal swab specimens and should not be used as a sole basis for treatment. Nasal washings and aspirates are unacceptable for Xpert Xpress SARS-CoV-2/FLU/RSV testing.  Fact Sheet for Patients: BloggerCourse.com  Fact Sheet for Healthcare Providers: SeriousBroker.it  This test is not yet approved or cleared by the [place] FDA and has been authorized for detection and/or diagnosis of SARS-CoV-2 by FDA under an Emergency Use Authorization (EUA). This EUA will remain in effect (meaning this test can be used) for the duration of the COVID-19 declaration under Section 564(b)(1) of the Act, 21 U.S.C. section 360bbb-3(b)(1), unless the authorization is terminated or revoked.     Resp Syncytial Virus by PCR NEGATIVE NEGATIVE Final    Comment: (NOTE) Fact Sheet for Patients: BloggerCourse.com  Fact Sheet for Healthcare Providers: SeriousBroker.it  This test is not yet approved or cleared by the [place] FDA and has been authorized for detection and/or diagnosis of SARS-CoV-2 by FDA under an Emergency Use Authorization (EUA). This EUA will remain in effect (meaning this test can be used) for the duration of the COVID-19 declaration under Section 564(b)(1) of the Act, 21 U.S.C. section 360bbb-3(b)(1), unless the authorization is terminated or revoked.  Performed at [facility], [address] [place], [place] [address]      Radiological Exams on Admission: DG Chest 2 View Result Date: 04/19/2023 CLINICAL DATA:  Dyspnea EXAM: CHEST - 2 VIEW COMPARISON:  None Available. FINDINGS: Lungs  are well expanded, symmetric, and clear. No pneumothorax or pleural effusion. Cardiac size within normal limits. Pulmonary vascularity is normal. Osseous structures are age-appropriate. Dorsal column stimulator leads again noted. No acute bone abnormality. IMPRESSION: No active cardiopulmonary disease. Electronically Signed   By: Helyn Numbers M.D.   On: 04/19/2023 20:19   DG Chest 2 View Result Date: 04/19/2023 CLINICAL DATA:  Hypoxia EXAM: CHEST - 2 VIEW COMPARISON:  None Available. FINDINGS: The heart size and mediastinal contours are within normal limits. Both lungs are clear. The visualized skeletal structures are unremarkable. Dorsal column stimulator leads noted. IMPRESSION: No active cardiopulmonary disease. Electronically Signed   By: Helyn Numbers M.D.   On: 04/19/2023 20:09    EKG: Independently reviewed.  Normal sinus rhythm.  Assessment/Plan Principal Problem:   Acute respiratory failure with hypoxia (HCC) Active Problems:   Type 2 diabetes mellitus with diabetic polyneuropathy, with long-term current use of insulin (HCC)   Graves disease   Influenza A   Essential hypertension    Acute respiratory failure with hypoxia in the setting of influenza A infection presently on 2 L oxygen.  On exam patient's bilateral air exchange is appears diminished.  Will keep patient on scheduled and as needed DuoNebs along with Pulmicort.  Continue Tamiflu for influenza A infection.  Diabetes mellitus type 2 with recent hemoglobin A1c last week being 13.1.  Takes 64 units of long-acting insulin with 10 units premeals recently started on Mounjaro by patient's endocrinologist which patient has yet to start. Hypertension uncontrolled on amlodipine, ARB, metoprolol, spironolactone, Imdur.  Follow blood pressure trends. Hyperthyroidism on methimazole. Nonobstructive CAD and hyperlipidemia on statins. Paroxysmal atrial fibrillation on Eliquis and metoprolol. Depression on Wellbutrin and Prozac. GERD on  PPI.  Since patient has respiratory failure in the setting of influenza A  will need close monitoring and more than 2 midnight stay.   DVT prophylaxis: Eliquis. Code Status: Full code. Family Communication: Discussed with patient. Disposition Plan: Monitored bed. Consults called: None. Admission status: Observation.

## 2023-04-21 DIAGNOSIS — J9601 Acute respiratory failure with hypoxia: Secondary | ICD-10-CM | POA: Diagnosis not present

## 2023-04-21 LAB — CBC WITH DIFFERENTIAL/PLATELET
Abs Immature Granulocytes: 0.01 10*3/uL (ref 0.00–0.07)
Basophils Absolute: 0 10*3/uL (ref 0.0–0.1)
Basophils Relative: 1 %
Eosinophils Absolute: 0 10*3/uL (ref 0.0–0.5)
Eosinophils Relative: 0 %
HCT: 38.7 % (ref 36.0–46.0)
Hemoglobin: 12.1 g/dL (ref 12.0–15.0)
Immature Granulocytes: 0 %
Lymphocytes Relative: 31 %
Lymphs Abs: 1.5 10*3/uL (ref 0.7–4.0)
MCH: 24.3 pg — ABNORMAL LOW (ref 26.0–34.0)
MCHC: 31.3 g/dL (ref 30.0–36.0)
MCV: 77.9 fL — ABNORMAL LOW (ref 80.0–100.0)
Monocytes Absolute: 0.7 10*3/uL (ref 0.1–1.0)
Monocytes Relative: 14 %
Neutro Abs: 2.7 10*3/uL (ref 1.7–7.7)
Neutrophils Relative %: 54 %
Platelets: 181 10*3/uL (ref 150–400)
RBC: 4.97 MIL/uL (ref 3.87–5.11)
RDW: 18.4 % — ABNORMAL HIGH (ref 11.5–15.5)
WBC: 5 10*3/uL (ref 4.0–10.5)
nRBC: 0.4 % — ABNORMAL HIGH (ref 0.0–0.2)

## 2023-04-21 LAB — BASIC METABOLIC PANEL
Anion gap: 9 (ref 5–15)
BUN: 20 mg/dL (ref 6–20)
CO2: 23 mmol/L (ref 22–32)
Calcium: 8.6 mg/dL — ABNORMAL LOW (ref 8.9–10.3)
Chloride: 100 mmol/L (ref 98–111)
Creatinine, Ser: 0.9 mg/dL (ref 0.44–1.00)
GFR, Estimated: >60 mL/min (ref >60)
Glucose, Bld: 221 mg/dL — ABNORMAL HIGH (ref 70–99)
Potassium: 3.9 mmol/L (ref 3.5–5.1)
Sodium: 132 mmol/L — ABNORMAL LOW (ref 135–145)

## 2023-04-21 LAB — GLUCOSE, CAPILLARY
Glucose-Capillary: 196 mg/dL — ABNORMAL HIGH (ref 70–99)
Glucose-Capillary: 357 mg/dL — ABNORMAL HIGH (ref 70–99)

## 2023-04-21 MED ORDER — INSULIN GLARGINE 100 UNIT/ML ~~LOC~~ SOLN: 50.0000 [IU] | Freq: Every day | SUBCUTANEOUS | Status: DC

## 2023-04-21 MED ORDER — IPRATROPIUM-ALBUTEROL 0.5-2.5 (3) MG/3ML IN SOLN: 3.0000 mL | Freq: Four times a day (QID) | RESPIRATORY_TRACT | Status: DC | PRN

## 2023-04-21 MED ORDER — OSELTAMIVIR PHOSPHATE 75 MG PO CAPS: 75.0000 mg | ORAL_CAPSULE | Freq: Two times a day (BID) | ORAL | 0 refills | Status: DC

## 2023-04-21 MED ORDER — GUAIFENESIN ER 600 MG PO TB12: 600.0000 mg | ORAL_TABLET | Freq: Two times a day (BID) | ORAL | 0 refills | Status: AC

## 2023-04-21 NOTE — Discharge Summary (Signed)
 Physician Discharge Summary  [name] BJY:[id number] DOB: 1965-03-24 DOA: 04/19/2023  PCP: Lewis Moccasin, MD  Admit date: 04/19/2023 Discharge date: 04/21/2023  Admitted From: Home Discharge disposition: Home  Recommendations at discharge:  Complete the course of Tamiflu Continue Mucinex for cough   Brief narrative: Carmen Armstrong is a 58 y.o. female with PMH significant for DM2, HTN, HLD, A-fib, CHF, CAD, PAD, neuropathy, hypERthyroidism, arthritis, anxiety/depression. Lives at home alone.  Independent. 3/6, patient presented to the ED with complaint of shortness of breath, congestion, cough, runny nose, fevers, chills for 2 days, noted with Mucinex or NyQuil. She was seen at PCPs office, noted to be hypoxic and required 2 L oxygen by nasal Miraj Truss 90 and sent to ED  In the ED, she had a low-grade temperature 99.6, blood pressure 154/88,, on 2 L of significant Labs with WC count 7.8, hemoglobin 12.7 Respiratory virus panel positive for influenza A Chest x-ray unremarkable Patient was started on Tamiflu Admitted to [facility] course as below  Subjective: Patient was seen and examined this morning. Lying on bed.  Not in distress.  Not on supplemental oxygen.  She states she was able to ambulate on the hallway without supplemental oxygen. Feels ready to be discharged today.  Hospital course: Acute respiratory with hypoxia  Influenza A  Presented with URI symptoms, shortness of breath,  was hypoxic in PCPs office and required 2 L oxygen Influenza A positive. Chest x-ray unremarkable Started on Tamiflu, DuoNeb, Mucinex, incentive spirometry Feels much better today and ready to go home. Discharged home to complete the course of Tamiflu.  Mucinex  for 7 days Recent Labs  Lab 04/19/23 2034 04/20/23 0414 04/21/23 0424  WBC 7.8 6.8 5.0   Type 2 diabetes mellitus uncontrolled A1c 13.1 04/07/2023 PTA meds-Lantus 64 units, Premeal insulin, dapagliflozin 5  mg daily, metformin 1000 mg daily.recently switched to [facility] by endocrinologist which she had not started yet Continue same plan under the supervision of endocrinologist as an outpatient.  Hypertension PTA meds- Toprol 100 mg daily, Imdur 60 mg daily, amlodipine 10 mg daily, candesartan 4 mg daily, Aldactone 25 mg daily Continue all  CAD, PAD, HLD Eliquis, Lipitor  Paroxysmal A-fib Metoprolol and Eliquis  HypERthyroidism Methimazole  GERD PPI  Anxiety/depression Peripheral neuropathy Wellbutrin 300 mg daily and Prozac 20 mg daily Neurontin 600 mg daily  Chronic back pain  Chronic constipation Patient reports chronic back pain, has had 6 different surgeries.  Seems to be on MS Contin 15 mg twice daily, Percocet as needed Continue Linzess for constipation   Mobility: Encourage ambulation  Goals of care   Code Status: Full Code    Consultants: None Family Communication: None at bedside  Diet:  Diet Order             Diet general           Diet heart healthy/carb modified Room service appropriate? Yes; Fluid consistency: Thin  Diet effective now                   Nutritional status:  Body mass index is 31.9 kg/m.       Wounds:  -    Discharge Exam:   Vitals:   04/20/23 2341 04/21/23 0324 04/21/23 0805 04/21/23 0858  BP:    (!) 120/58  Pulse:    66  Resp:    18  Temp:    98.3 F (36.8 C)  TempSrc:    Oral  SpO2: 95% 90% 96%  100%  Weight:      Height:        Body mass index is 31.9 kg/m.   General exam: Pleasant, middle-aged African-American female Skin: No rashes, lesions or ulcers. HEENT: Atraumatic, normocephalic, no obvious bleeding Lungs: Clear to auscultation bilaterally CVS: S1, S2, no murmur,   GI/Abd: Soft, nontender, nondistended, bowel sound present,   CNS: Alert, awake, oriented x 3 Psychiatry: Mood appropriate,  Extremities: No pedal edema, no calf tenderness,   Follow ups:    Follow-up Information     Lewis Moccasin, MD Follow up.   Specialty: Family Medicine Contact information: [address] [place] [address] [phone]                 Discharge Instructions:   Discharge Instructions     Call MD for:  difficulty breathing, headache or visual disturbances   Complete by: As directed    Call MD for:  extreme fatigue   Complete by: As directed    Call MD for:  hives   Complete by: As directed    Call MD for:  persistant dizziness or light-headedness   Complete by: As directed    Call MD for:  persistant nausea and vomiting   Complete by: As directed    Call MD for:  severe uncontrolled pain   Complete by: As directed    Call MD for:  temperature >100.4   Complete by: As directed    Diet general   Complete by: As directed    Discharge instructions   Complete by: As directed    Recommendations at discharge:   Complete the course of Tamiflu  Continue Mucinex for cough  General discharge instructions: Follow with Primary MD Lewis Moccasin, MD in 7 days  Please request your PCP  to go over your hospital tests, procedures, radiology results at the follow up. Please get your medicines reviewed and adjusted.  Your PCP may decide to repeat certain labs or tests as needed. Do not drive, operate heavy machinery, perform activities at heights, swimming or participation in water activities or provide baby sitting services if your were admitted for syncope or siezures until you have seen by Primary MD or a Neurologist and advised to do so again. [place] Controlled Substance Reporting System database was reviewed. Do not drive, operate heavy machinery, perform activities at heights, swim, participate in water activities or provide baby-sitting services while on medications for pain, sleep and mood until your outpatient physician has reevaluated you and advised to do so again.  You are strongly recommended to comply with the dose, frequency and duration of prescribed  medications. Activity: As tolerated with Full fall precautions use walker/cane & assistance as needed Avoid using any recreational substances like cigarette, tobacco, alcohol, or non-prescribed drug. If you experience worsening of your admission symptoms, develop shortness of breath, life threatening emergency, suicidal or homicidal thoughts you must seek medical attention immediately by calling 911 or calling your MD immediately  if symptoms less severe. You must read complete instructions/literature along with all the possible adverse reactions/side effects for all the medicines you take and that have been prescribed to you. Take any new medicine only after you have completely understood and accepted all the possible adverse reactions/side effects.  Wear Seat belts while driving. You were cared for by a hospitalist during your hospital stay. If you have any questions about your discharge medications or the care you received while you were in the hospital after  you are discharged, you can call the unit and ask to speak with the hospitalist or the covering physician. Once you are discharged, your primary care physician will handle any further medical issues. Please note that NO REFILLS for any discharge medications will be authorized once you are discharged, as it is imperative that you return to your primary care physician (or establish a relationship with a primary care physician if you do not have one).   Increase activity slowly   Complete by: As directed        Discharge Medications:   Allergies as of 04/21/2023       Reactions   Corylus    Omeprazole    Other reaction(s): Not available   Other    Hazelnuts- SOB   Pear    Penicillin G    whelps   Trulicity [dulaglutide]    Large itchy bumps        Medication List     TAKE these medications    amLODipine 10 MG tablet Commonly known as: NORVASC Take 1 tablet (10 mg total) by mouth daily. What changed: when to take this    atorvastatin 40 MG tablet Commonly known as: LIPITOR Take 40 mg by mouth at bedtime.   buPROPion 300 MG 24 hr tablet Commonly known as: WELLBUTRIN XL Take 300 mg by mouth every morning.   candesartan 4 MG tablet Commonly known as: ATACAND Take 4 mg by mouth daily.   Dapagliflozin Pro-metFORMIN ER 06-998 MG Tb24 Commonly known as: Xigduo XR Take 2 tablets by mouth daily.   Eliquis 5 MG Tabs tablet Generic drug: apixaban Take 1 tablet (5 mg total) by mouth 2 (two) times daily.   FLUoxetine 20 MG capsule Commonly known as: PROZAC Take 20 mg by mouth daily.   gabapentin 600 MG tablet Commonly known as: NEURONTIN Take 600 mg by mouth at bedtime.   guaiFENesin 600 MG 12 hr tablet Commonly known as: MUCINEX Take 1 tablet (600 mg total) by mouth 2 (two) times daily for 7 days.   isosorbide mononitrate 60 MG 24 hr tablet Commonly known as: IMDUR Take 1 tablet (60 mg total) by mouth in the morning.   Lantus SoloStar 100 UNIT/ML Solostar Pen Generic drug: insulin glargine Inject 64 Units into the skin daily.   Linzess 145 MCG Caps capsule Generic drug: linaclotide Take 145 mcg by mouth daily.   methimazole 5 MG tablet Commonly known as: TAPAZOLE Take 1 tablet (5 mg total) by mouth as directed. 1 tablet Monday through Saturday, none on Sundays   metoprolol succinate 100 MG 24 hr tablet Commonly known as: TOPROL-XL Take 100 mg by mouth daily.   morphine 15 MG 12 hr tablet Commonly known as: MS CONTIN Take 15 mg by mouth 2 (two) times daily.   naloxone 4 MG/0.1ML Liqd nasal spray kit Commonly known as: NARCAN Place 1 spray into the nose as needed (OD).   NovoLOG FlexPen 100 UNIT/ML FlexPen Generic drug: insulin aspart Inject 10-20 Units into the skin 3 (three) times daily with meals.   omeprazole 20 MG capsule Commonly known as: PRILOSEC Take 20 mg by mouth daily.   oseltamivir 75 MG capsule Commonly known as: TAMIFLU Take 1 capsule (75 mg total) by mouth 2  (two) times daily.   oxyCODONE-acetaminophen 5-325 MG tablet Commonly known as: PERCOCET/ROXICET Take 0.5-1 tablets by mouth 2 (two) times daily as needed for moderate pain.   Simply Sleep 25 MG tablet Generic drug: diphenhydrAMINE Take 25 mg by  mouth at bedtime.   spironolactone 25 MG tablet Commonly known as: ALDACTONE Take 1 tablet (25 mg total) by mouth daily.   tirzepatide 10 MG/0.5ML Pen Commonly known as: MOUNJARO Inject 10 mg into the skin once a week.   zolpidem 6.25 MG CR tablet Commonly known as: AMBIEN CR Take 12.5 mg by mouth at bedtime.         The results of significant diagnostics from this hospitalization (including imaging, microbiology, ancillary and laboratory) are listed below for reference.    Procedures and Diagnostic Studies:   DG Chest 2 View Result Date: 04/19/2023 CLINICAL DATA:  Dyspnea EXAM: CHEST - 2 VIEW COMPARISON:  None Available. FINDINGS: Lungs are well expanded, symmetric, and clear. No pneumothorax or pleural effusion. Cardiac size within normal limits. Pulmonary vascularity is normal. Osseous structures are age-appropriate. Dorsal column stimulator leads again noted. No acute bone abnormality. IMPRESSION: No active cardiopulmonary disease. Electronically Signed   By: Helyn Numbers M.D.   On: 04/19/2023 20:19   DG Chest 2 View Result Date: 04/19/2023 CLINICAL DATA:  Hypoxia EXAM: CHEST - 2 VIEW COMPARISON:  None Available. FINDINGS: The heart size and mediastinal contours are within normal limits. Both lungs are clear. The visualized skeletal structures are unremarkable. Dorsal column stimulator leads noted. IMPRESSION: No active cardiopulmonary disease. Electronically Signed   By: Helyn Numbers M.D.   On: 04/19/2023 20:09     Labs:   Basic Metabolic Panel: Recent Labs  Lab 04/19/23 2034 04/20/23 0414 04/21/23 0424  NA 141 134* 132*  K 4.6 3.9 3.9  CL 102 104 100  CO2 24 23 23   GLUCOSE 187* 137* 221*  BUN 9 11 20   CREATININE  0.95 0.84 0.90  CALCIUM 9.6 8.6* 8.6*   GFR Estimated Creatinine Clearance: 84.9 mL/min (by C-G formula based on SCr of 0.9 mg/dL). Liver Function Tests: Recent Labs  Lab 04/19/23 2034 04/20/23 0414  AST 19 19  ALT 15 15  ALKPHOS 95 91  BILITOT 0.9 0.9  PROT 7.2 7.1  ALBUMIN 3.6 3.4*   No results for input(s): "LIPASE", "AMYLASE" in the last 168 hours. No results for input(s): "AMMONIA" in the last 168 hours. Coagulation profile No results for input(s): "INR", "PROTIME" in the last 168 hours.  CBC: Recent Labs  Lab 04/19/23 2034 04/20/23 0414 04/21/23 0424  WBC 7.8 6.8 5.0  NEUTROABS 6.2  --  2.7  HGB 12.7 12.6 12.1  HCT 41.4 40.7 38.7  MCV 79.3* 77.7* 77.9*  PLT 237 210 181   Cardiac Enzymes: No results for input(s): "CKTOTAL", "CKMB", "CKMBINDEX", "TROPONINI" in the last 168 hours. BNP: Invalid input(s): "POCBNP" CBG: Recent Labs  Lab 04/20/23 1200 04/20/23 1701 04/20/23 2132 04/21/23 0719 04/21/23 1123  GLUCAP 157* 256* 269* 196* 357*   D-Dimer No results for input(s): "DDIMER" in the last 72 hours. Hgb A1c No results for input(s): "HGBA1C" in the last 72 hours. Lipid Profile No results for input(s): "CHOL", "HDL", "LDLCALC", "TRIG", "CHOLHDL", "LDLDIRECT" in the last 72 hours. Thyroid function studies No results for input(s): "TSH", "T4TOTAL", "T3FREE", "THYROIDAB" in the last 72 hours.  Invalid input(s): "FREET3" Anemia work up No results for input(s): "VITAMINB12", "FOLATE", "FERRITIN", "TIBC", "IRON", "RETICCTPCT" in the last 72 hours. Microbiology Recent Results (from the past 240 hours)  Resp panel by RT-PCR (RSV, Flu A&B, Covid) Anterior Nasal Swab     Status: Abnormal   Collection Time: 04/19/23  6:38 PM   Specimen: Anterior Nasal Swab  Result Value Ref Range Status  SARS Coronavirus 2 by RT PCR NEGATIVE NEGATIVE Final   Influenza A by PCR POSITIVE (A) NEGATIVE Final   Influenza B by PCR NEGATIVE NEGATIVE Final    Comment: (NOTE) The  Xpert Xpress SARS-CoV-2/FLU/RSV plus assay is intended as an aid in the diagnosis of influenza from Nasopharyngeal swab specimens and should not be used as a sole basis for treatment. Nasal washings and aspirates are unacceptable for Xpert Xpress SARS-CoV-2/FLU/RSV testing.  Fact Sheet for Patients: BloggerCourse.com  Fact Sheet for Healthcare Providers: SeriousBroker.it  This test is not yet approved or cleared by the [place] FDA and has been authorized for detection and/or diagnosis of SARS-CoV-2 by FDA under an Emergency Use Authorization (EUA). This EUA will remain in effect (meaning this test can be used) for the duration of the COVID-19 declaration under Section 564(b)(1) of the Act, 21 U.S.C. section 360bbb-3(b)(1), unless the authorization is terminated or revoked.     Resp Syncytial Virus by PCR NEGATIVE NEGATIVE Final    Comment: (NOTE) Fact Sheet for Patients: BloggerCourse.com  Fact Sheet for Healthcare Providers: SeriousBroker.it  This test is not yet approved or cleared by the [place] FDA and has been authorized for detection and/or diagnosis of SARS-CoV-2 by FDA under an Emergency Use Authorization (EUA). This EUA will remain in effect (meaning this test can be used) for the duration of the COVID-19 declaration under Section 564(b)(1) of the Act, 21 U.S.C. section 360bbb-3(b)(1), unless the authorization is terminated or revoked.  Performed at [facility], [address] [place], [place] [address]     Time coordinating discharge: 45 minutes  Signed: Samael Blades  [facility] 04/21/2023, 11:41 AM

## 2023-04-23 ENCOUNTER — Ambulatory Visit: Payer: 59 | Admitting: Podiatry

## 2023-04-24 NOTE — Telephone Encounter (Signed)
 Any update on PA?

## 2023-04-25 ENCOUNTER — Encounter: Payer: Self-pay | Admitting: Podiatry

## 2023-04-25 ENCOUNTER — Ambulatory Visit: Payer: 59 | Admitting: Podiatry

## 2023-04-25 DIAGNOSIS — M79674 Pain in right toe(s): Secondary | ICD-10-CM

## 2023-04-25 DIAGNOSIS — L84 Corns and callosities: Secondary | ICD-10-CM

## 2023-04-25 DIAGNOSIS — E1142 Type 2 diabetes mellitus with diabetic polyneuropathy: Secondary | ICD-10-CM | POA: Diagnosis not present

## 2023-04-25 DIAGNOSIS — B351 Tinea unguium: Secondary | ICD-10-CM

## 2023-04-25 DIAGNOSIS — M79675 Pain in left toe(s): Secondary | ICD-10-CM | POA: Diagnosis not present

## 2023-04-25 NOTE — Progress Notes (Signed)
  Subjective:  Patient ID: Carmen Armstrong, female    DOB: 1965-04-28,   MRN: [id number]  No chief complaint on file.   58 y.o. female presents for concern of calluses and darkening of her skin . Also concern of thickened elongated and painful nails that are difficult to trim. Requesting to have them trimmed today. Relates burning and tingling in their feet. Patient is diabetic and last A1c was  Lab Results  Component Value Date   HGBA1C 13.1 (A) 04/13/2023   .    PCP:  Lewis Moccasin, MD    . Denies any other pedal complaints. Denies n/v/f/c.   Past Medical History:  Diagnosis Date   Arrhythmia    Arthritis    Atrial fibrillation (HCC)    CAD (coronary artery disease)    Chronic lower back pain    Depression    Diabetes mellitus without complication (HCC)    Eczema    GAD (generalized anxiety disorder)    GERD (gastroesophageal reflux disease)    H/O vitamin D deficiency    Heart failure (HCC)    Hyperlipidemia    Hypertension    Hypothyroidism    Insomnia    Peripheral neuropathy    PVD (peripheral vascular disease) (HCC)     Objective:  Physical Exam: Vascular: DP/PT pulses 1/4 bilateral. CFT <3 seconds. Absent hair growth on digits. Edema noted to bilateral lower extremities. Xerosis noted bilaterally.  Skin. No lacerations or abrasions bilateral feet. Nails 1-5 bilateral remaining are thickened discolored and elongated with subungual debris. Small ulceration noted to distal right hallux is healed but nearly ulcerative. Left hallux xerosis noted with cracking of skin.  Musculoskeletal: MMT 5/5 bilateral lower extremities in DF, PF, Inversion and Eversion. Deceased ROM in DF of ankle joint. 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 Hammered digits 1-5 bilateral.  Neurological: Sensation intact to light touch. Protective sensation diminished bilateral.    Assessment:   1. Pre-ulcerative calluses   2. Diabetic polyneuropathy associated with  type 2 diabetes mellitus (HCC)   3. Pain due to onychomycosis of toenails of both feet             Plan:  Patient was evaluated and treated and all questions answered. Ulcer right distal hallux-healed.  -Discussed and educated patient on diabetic foot care, especially with  regards to the vascular, neurological and musculoskeletal systems.  -Stressed the importance of good glycemic control and the detriment of not  controlling glucose levels in relation to the foot. -Discussed supportive shoes at all times and checking feet regularly.  -Mechanically debrided all nails 1-5 bilateral using sterile nail nipper and filed with dremel without incident -Hyperkeratosis to distal right hallux and distal left second digit debrided with chisel without incident.   -Advised on proper moisturizing of her feet to prevent cracking.  -Answered all patient questions -Patient to return  in 3 months for at risk foot care -Patient advised to call the office if any problems or questions arise in the meantime.   No follow-ups on file.   Louann Sjogren, DPM

## 2023-04-26 ENCOUNTER — Ambulatory Visit: Payer: 59 | Admitting: Internal Medicine

## 2023-04-30 DIAGNOSIS — D6869 Other thrombophilia: Secondary | ICD-10-CM | POA: Insufficient documentation

## 2023-04-30 NOTE — Telephone Encounter (Signed)
 Pharmacy Patient Advocate Encounter  Received notification from [facility] that Prior Authorization for Carmen Armstrong has been APPROVED through 04/23/2024

## 2023-05-02 ENCOUNTER — Encounter: Payer: Self-pay | Admitting: Cardiology

## 2023-05-02 ENCOUNTER — Ambulatory Visit: Payer: 59 | Attending: Cardiology | Admitting: Cardiology

## 2023-05-02 VITALS — BP 120/68 | HR 63 | Resp 16 | Ht 69.0 in | Wt 229.4 lb

## 2023-05-02 DIAGNOSIS — I2510 Atherosclerotic heart disease of native coronary artery without angina pectoris: Secondary | ICD-10-CM | POA: Diagnosis not present

## 2023-05-02 DIAGNOSIS — I480 Paroxysmal atrial fibrillation: Secondary | ICD-10-CM

## 2023-05-02 DIAGNOSIS — F17210 Nicotine dependence, cigarettes, uncomplicated: Secondary | ICD-10-CM

## 2023-05-02 DIAGNOSIS — R931 Abnormal findings on diagnostic imaging of heart and coronary circulation: Secondary | ICD-10-CM

## 2023-05-02 DIAGNOSIS — Z7901 Long term (current) use of anticoagulants: Secondary | ICD-10-CM

## 2023-05-02 DIAGNOSIS — I739 Peripheral vascular disease, unspecified: Secondary | ICD-10-CM

## 2023-05-02 DIAGNOSIS — I6523 Occlusion and stenosis of bilateral carotid arteries: Secondary | ICD-10-CM | POA: Diagnosis not present

## 2023-05-02 DIAGNOSIS — I10 Essential (primary) hypertension: Secondary | ICD-10-CM

## 2023-05-02 MED ORDER — ELIQUIS 5 MG PO TABS: 5.0000 mg | ORAL_TABLET | Freq: Two times a day (BID) | ORAL | 3 refills | Status: AC

## 2023-05-02 NOTE — Patient Instructions (Signed)
 Medication Instructions:  Your physician recommends that you continue on your current medications as directed. Please refer to the Current Medication list given to you today.  Refill for Eliquis has been sent to your pharmacy.  *If you need a refill on your cardiac medications before your next appointment, please call your pharmacy*  Lab Work: None ordered today. If you have labs (blood work) drawn today and your tests are completely normal, you will receive your results only by: MyChart Message (if you have MyChart) OR A paper copy in the mail If you have any lab test that is abnormal or we need to change your treatment, we will call you to review the results.  Testing/Procedures: Your physician has requested that you have a carotid duplex in February 2026. This test is an ultrasound of the carotid arteries in your neck. It looks at blood flow through these arteries that supply the brain with blood. Allow one hour for this exam. There are no restrictions or special instructions.  Your physician has requested that you have an ankle brachial index (ABI) in February 2026. During this test an ultrasound and blood pressure cuff are used to evaluate the arteries that supply the arms and legs with blood. Allow thirty minutes for this exam. There are no restrictions or special instructions.  Please note: We ask at that you not bring children with you during ultrasound (echo/ vascular) testing. Due to room size and safety concerns, children are not allowed in the ultrasound rooms during exams. Our front office staff cannot provide observation of children in our lobby area while testing is being conducted. An adult accompanying a patient to their appointment will only be allowed in the ultrasound room at the discretion of the ultrasound technician under special circumstances. We apologize for any inconvenience.   Follow-Up: At [facility], you and your health needs are our priority.  As part of our  continuing mission to provide you with exceptional heart care, we have created designated Provider Care Teams.  These Care Teams include your primary Cardiologist (physician) and Advanced Practice Providers (APPs -  Physician Assistants and Nurse Practitioners) who all work together to provide you with the care you need, when you need it.  We recommend signing up for the patient portal called "MyChart".  Sign up information is provided on this After Visit Summary.  MyChart is used to connect with patients for Virtual Visits (Telemedicine).  Patients are able to view lab/test results, encounter notes, upcoming appointments, etc.  Non-urgent messages can be sent to your provider as well.   To learn more about what you can do with MyChart, go to [email or web address].    Your next appointment:   1 year(s)  The format for your next appointment:   In Person  Provider:   Tessa Lerner, DO {

## 2023-05-02 NOTE — Progress Notes (Signed)
 Cardiology Office Note:  .   Date:  05/02/2023  ID:  Carmen Armstrong, DOB 02/11/1966, MRN [id number] PCP:  Lewis Moccasin, MD  Former Cardiology Providers: NA [facility] Providers Cardiologist:  Tessa Lerner, DO , [facility]  Electrophysiologist:  None  Click to update primary MD,subspecialty MD or APP then REFRESH:1}    Chief Complaint  Patient presents with   Coronary artery disease involving native coronary artery of   Follow-up    History of Present Illness: .   Carmen Armstrong is a 58 y.o.  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   Patient is being followed by the practice given her history of CAD, coronary atherosclerosis, history of atrial fibrillation.  Patient presents today for 65-month follow-up visit.  Since last office visit patient denies any anginal chest pain or heart failure symptoms.  No hospitalizations or urgent care visits for cardiovascular reasons.  She has been compliant with his medical therapy.  Function capacity is limited due to back pain.  No structured exercise program or daily routine.  Home blood pressures are well-controlled.  Recent labs from April 21, 2023 independently reviewed.  Patient does not endorse evidence of bleeding.  She continues to smoke approximate 0.3 packs/day.  Review of Systems: .   Review of Systems  Cardiovascular:  Negative for chest pain, claudication, irregular heartbeat, leg swelling, near-syncope, orthopnea, palpitations, paroxysmal nocturnal dyspnea and syncope.  Respiratory:  Negative for shortness of breath.    Hematologic/Lymphatic: Negative for bleeding problem.  Musculoskeletal:  Positive for back pain.    Studies Reviewed:   EKG: EKG Interpretation Date/Time:  Wednesday May 02 2023 09:17:56 EDT Ventricular Rate:  63 PR Interval:  156 QRS Duration:  86 QT Interval:  400 QTC Calculation: 409 R Axis:   48  Text Interpretation: Normal sinus rhythm Low voltage QRS When compared with ECG of 19-Apr-2023 18:37, No significant change was found Confirmed by Tessa Lerner [phone]) on 05/02/2023 9:24:14 AM  Echocardiogram: 08/28/2022: LVEF 65%, grade 1 diastolic dysfunction, no significant valvular heart disease.  CCTA 12/08/2020: 1. Total coronary calcium score of 457. This was 99th percentile for age and sex matched control.   2. Normal coronary origin with left dominance.   3. CAD-RADS = 3. Minimal stenosis (0-24%) at the ostial left main due to calcified plaque.   Mild stenosis (25-49%) at distal left main/ostial LAD due to calcified plaque. Moderate stenosis (50-69%) at mid LAD due to calcified plaque. Mid to distal LAD is patent.   LCX is patent with diffuse luminal irregularities no evidence of significant stenosis.   Mild stenosis (25-49%) at proximal RCA due to calcified plaque. Mid to distal RCA is patent.   4.  Aortic Atherosclerosis.  Carotid artery duplex  10/03/2021: Right ICA minimal disease, left ICA 50 to 69%, see report for additional details 08/28/2022: Bilateral ICA disease 1-15%, see report  Lower Extremity Arterial Duplex 08/28/2022: No hemodynamically significant stenoses are identified in the right lower extremity arterial system. No hemodynamically significant stenoses are identified in the left lower extremity arterial system. This exam reveals normal perfusion of the right lower extremity (ABI 1.01) with severely abnormal monophasic waveform pattern and suggests small vessel disease.  This exam  reveals normal perfusion of the left lower extremity (ABI 1.05)  with severely abnormal monophasic waveform pattern left AT and mildly abnormal biphasic waveform pattern at the left PT.   RADIOLOGY: NA  Risk Assessment/Calculations:   NA   Labs:       Latest Ref Rng & Units 04/21/2023    4:24 AM 04/20/2023    4:14 AM 04/19/2023    8:34 PM  CBC  WBC 4.0 - 10.5 K/uL 5.0  6.8  7.8   Hemoglobin 12.0 - 15.0 g/dL 40.9  81.1  91.4   Hematocrit 36.0 - 46.0 % 38.7  40.7  41.4   Platelets 150 - 400 K/uL 181  210  237        Latest Ref Rng & Units 04/21/2023    4:24 AM 04/20/2023    4:14 AM 04/19/2023    8:34 PM  BMP  Glucose 70 - 99 mg/dL 782  956  213   BUN 6 - 20 mg/dL 20  11  9    Creatinine 0.44 - 1.00 mg/dL 0.86  5.78  4.69   Sodium 135 - 145 mmol/L 132  134  141   Potassium 3.5 - 5.1 mmol/L 3.9  3.9  4.6   Chloride 98 - 111 mmol/L 100  104  102   CO2 22 - 32 mmol/L 23  23  24    Calcium 8.9 - 10.3 mg/dL 8.6  8.6  9.6       Latest Ref Rng & Units 04/21/2023    4:24 AM 04/20/2023    4:14 AM 04/19/2023    8:34 PM  CMP  Glucose 70 - 99 mg/dL 629  528  413   BUN 6 - 20 mg/dL 20  11  9    Creatinine 0.44 - 1.00 mg/dL 2.44  0.10  2.72   Sodium 135 - 145 mmol/L 132  134  141   Potassium 3.5 - 5.1 mmol/L 3.9  3.9  4.6   Chloride 98 - 111 mmol/L 100  104  102   CO2 22 - 32 mmol/L 23  23  24    Calcium 8.9 - 10.3 mg/dL 8.6  8.6  9.6   Total Protein 6.5 - 8.1 g/dL  7.1  7.2   Total Bilirubin 0.0 - 1.2 mg/dL  0.9  0.9   Alkaline Phos 38 - 126 U/L  91  95   AST 15 - 41 U/L  19  19   ALT 0 - 44 U/L  15  15     No results found for: "CHOL", "HDL", "LDLCALC", "LDLDIRECT", "TRIG", "CHOLHDL" No results for input(s): "LIPOA" in the last 8760 hours. No components found for: "NTPROBNP" No results for input(s): "PROBNP" in the last 8760 hours. Recent Labs    12/27/22 0846 04/13/23 1337  TSH 4.56 3.01    Physical Exam:    Today's Vitals   05/02/23 0911  BP: 120/68  Pulse: 63  Resp: 16  SpO2: 90%  Weight: 229 lb 6.4 oz (104.1 kg)  Height: 5\' 9"  (1.753  m)   Body mass index is 33.88 kg/m. Wt Readings from Last 3 Encounters:  05/02/23 229 lb 6.4 oz (104.1 kg)  04/20/23 216 lb (98 kg)  04/19/23 221 lb (100.2 kg)    Physical Exam  Constitutional: No distress.  hemodynamically stable  Eyes:  Right eye patched due to viral infection  Neck: No JVD present.  Cardiovascular: Normal rate, regular rhythm, S1 normal and S2 normal. Exam reveals decreased pulses.  Exam reveals no gallop, no S3 and no S4.  No murmur heard. Pulses:      Dorsalis pedis pulses are 0 on the right side and 0 on the left side.       Posterior tibial pulses are 1+ on the right side and 1+ on the left side.  Pulmonary/Chest: Effort normal and breath sounds normal. No stridor. She has no wheezes. She has no rales.  Musculoskeletal:        General: No edema.     Cervical back: Neck supple.     Comments: Left foot - amputation of partial first and complete fifth digit   Skin: Skin is warm.   Impression & Recommendation(s):  Impression:   ICD-10-CM   1. Coronary artery disease involving native coronary artery of native heart without angina pectoris  I25.10 EKG 12-Lead    2. Agatston CAC score, >400  R93.1     3. PVD (peripheral vascular disease) (HCC)  I73.9 VAS [place] ABI WITH/WO TBI    4. Mild atherosclerosis of both carotid arteries  I65.23 VAS US CAROTID    5. Paroxysmal atrial fibrillation (HCC)  I48.0 ELIQUIS 5 MG TABS tablet    6. Long term (current) use of anticoagulants  Z79.01     7. Essential hypertension  I10     8. Cigarette smoker  F17.210        Recommendation(s):  Coronary artery disease involving native coronary artery of native heart without angina pectoris Agatston CAC score, >400 Total CAC 457, 99th percentile CAD RADS 3 disease Denies anginal chest pain or heart failure symptoms. EKG is nonischemic. Antianginal therapy includes: Amlodipine, Imdur, metoprolol Prior echocardiogram results reviewed as part of medical decision making  today. Reemphasized importance of smoking cessation. Reemphasized the importance of secondary prevention with focus on improving her modifiable cardiovascular risk factors such as glycemic control, lipid management, blood pressure control, weight loss.  PVD (peripheral vascular disease) (HCC) Denies claudication. Risk factors: Cigarette smoking, insulin-dependent diabetes History of gangrene/amputation of the left lower extremity digits as mentioned above. Decreased pulses bilaterally. Will repeat ABI prior to the next office visit for follow-up Reemphasized importance of walking is much as possible though limited due to back pain as well as complete smoking cessation  Mild atherosclerosis of both carotid arteries Initial disease burden noted back in 2023. She had a repeat duplex in 2024 which noted significant improvement in overall disease burden. Will repeat a 2-year study in 2026 to reevaluate therapy as long as no significant disease progression is noted we can hold off on additional testing at that time. Continue statin therapy  Paroxysmal atrial fibrillation (HCC) Long term (current) use of anticoagulants Rate control: Toprol-XL. Rhythm control: N/A. Thromboembolic prophylaxis: Eliquis [id number] SCORE is 4 which correlates to 4.8% risk of stroke per year (hypertension, diabetes, vascular disease, gender) Does not endorse evidence of bleeding. Labs independently reviewed 04/21/2023 hemoglobin and renal function stable. Refill Eliquis.   Essential hypertension Office blood pressures are very well-controlled. Continue current medical therapy.  Cigarette smoker Tobacco cessation counseling: Currently smoking 0.3 packs/day   Patient denies claudication, known history of PAD.  See above Patient states that she is getting lung cancer screening arranged by PCP. She is informed of the dangers of tobacco abuse including stroke, cancer, and MI, as well as benefits of tobacco  cessation. She is willing to quit at this time. 5 mins were spent counseling patient cessation techniques. We discussed various methods to help quit smoking, including deciding on  a date to quit, joining a support group, pharmacological agents- nicotine gum/patch/lozenges.  I will reassess her progress at the next follow-up visit  Orders Placed:  Orders Placed This Encounter  Procedures   EKG 12-Lead     Final Medication List:    Meds ordered this encounter  Medications   ELIQUIS 5 MG TABS tablet    Sig: Take 1 tablet (5 mg total) by mouth 2 (two) times daily.    Dispense:  180 tablet    Refill:  3    Medications Discontinued During This Encounter  Medication Reason   morphine (MS CONTIN) 15 MG 12 hr tablet Patient Preference   oxyCODONE-acetaminophen (PERCOCET/ROXICET) 5-325 MG tablet Patient Preference   insulin glargine (LANTUS SOLOSTAR) 100 UNIT/ML Solostar Pen Change in therapy   ELIQUIS 5 MG TABS tablet Reorder     Current Outpatient Medications:    amLODipine (NORVASC) 10 MG tablet, Take 1 tablet (10 mg total) by mouth daily. (Patient taking differently: Take 10 mg by mouth at bedtime.), Disp: 90 tablet, Rfl: 1   atorvastatin (LIPITOR) 40 MG tablet, Take 40 mg by mouth at bedtime., Disp: , Rfl:    buPROPion (WELLBUTRIN XL) 300 MG 24 hr tablet, Take 300 mg by mouth every morning., Disp: , Rfl:    candesartan (ATACAND) 4 MG tablet, Take 4 mg by mouth daily., Disp: , Rfl:    Dapagliflozin Pro-metFORMIN ER (XIGDUO XR) 06-998 MG TB24, Take 2 tablets by mouth daily., Disp: 180 tablet, Rfl: 3   diphenhydrAMINE (SIMPLY SLEEP) 25 MG tablet, Take 25 mg by mouth at bedtime., Disp: , Rfl:    FLUoxetine (PROZAC) 20 MG capsule, Take 20 mg by mouth daily., Disp: , Rfl:    gabapentin (NEURONTIN) 600 MG tablet, Take 600 mg by mouth at bedtime., Disp: , Rfl:    insulin aspart (NOVOLOG FLEXPEN) 100 UNIT/ML FlexPen, Inject 10-20 Units into the skin 3 (three) times daily with meals., Disp: 60  mL, Rfl: 3   isosorbide mononitrate (IMDUR) 60 MG 24 hr tablet, Take 1 tablet (60 mg total) by mouth in the morning., Disp: 90 tablet, Rfl: 1   LINZESS 145 MCG CAPS capsule, Take 145 mcg by mouth daily., Disp: , Rfl:    methimazole (TAPAZOLE) 5 MG tablet, Take 1 tablet (5 mg total) by mouth as directed. 1 tablet Monday through Saturday, none on Sundays, Disp: 78 tablet, Rfl: 3   metoprolol succinate (TOPROL-XL) 100 MG 24 hr tablet, Take 100 mg by mouth daily., Disp: , Rfl:    naloxone (NARCAN) nasal spray 4 mg/0.1 mL, Place 1 spray into the nose as needed (OD)., Disp: , Rfl:    omeprazole (PRILOSEC) 20 MG capsule, Take 20 mg by mouth daily., Disp: , Rfl:    oseltamivir (TAMIFLU) 75 MG capsule, Take 1 capsule (75 mg total) by mouth 2 (two) times daily., Disp: 6 capsule, Rfl: 0   spironolactone (ALDACTONE) 25 MG tablet, Take 1 tablet (25 mg total) by mouth daily., Disp: 30 tablet, Rfl: 1   tirzepatide (MOUNJARO) 10 MG/0.5ML Pen, Inject 10 mg into the skin once a week., Disp: 6 mL, Rfl: 3   TRESIBA FLEXTOUCH 100 UNIT/ML FlexTouch Pen, Inject 64 Units into the skin daily., Disp: , Rfl:    zolpidem (AMBIEN CR) 6.25 MG CR tablet, Take 12.5 mg by mouth at bedtime., Disp: , Rfl:    ELIQUIS 5 MG TABS tablet, Take 1 tablet (5 mg total) by mouth 2 (two) times daily., Disp: 180 tablet, Rfl: 3  Consent:   NA  Disposition:   March 2026   Her questions and concerns were addressed to her satisfaction. She voices understanding of the recommendations provided during this encounter.    Signed, Tessa Lerner, DO, [facility]  [facility]  [address] [place], [place] [address] 05/02/2023 9:46 AM

## 2023-05-11 NOTE — Telephone Encounter (Signed)
 Done

## 2023-05-14 ENCOUNTER — Telehealth: Payer: Self-pay | Admitting: Podiatry

## 2023-05-14 NOTE — Telephone Encounter (Signed)
 Patient is calling and needing advice on what to do about her left toe next to great toe is oozing. She last spoke with Dr. Ralene Cork at last visit on 04/25/2023. Please contact patient at [phone]

## 2023-05-15 ENCOUNTER — Encounter (HOSPITAL_COMMUNITY): Payer: Self-pay

## 2023-05-15 ENCOUNTER — Ambulatory Visit (INDEPENDENT_AMBULATORY_CARE_PROVIDER_SITE_OTHER)

## 2023-05-15 ENCOUNTER — Inpatient Hospital Stay (HOSPITAL_COMMUNITY)

## 2023-05-15 ENCOUNTER — Ambulatory Visit (INDEPENDENT_AMBULATORY_CARE_PROVIDER_SITE_OTHER): Admitting: Podiatry

## 2023-05-15 ENCOUNTER — Inpatient Hospital Stay (HOSPITAL_COMMUNITY)
Admission: EM | Admit: 2023-05-15 | Discharge: 2023-05-24 | DRG: 617 | Disposition: A | Discharge status: Home / Self Care | Source: Ambulatory Visit | Attending: Internal Medicine | Admitting: Internal Medicine

## 2023-05-15 ENCOUNTER — Other Ambulatory Visit: Payer: Self-pay

## 2023-05-15 DIAGNOSIS — E1142 Type 2 diabetes mellitus with diabetic polyneuropathy: Secondary | ICD-10-CM | POA: Diagnosis present

## 2023-05-15 DIAGNOSIS — E11621 Type 2 diabetes mellitus with foot ulcer: Secondary | ICD-10-CM

## 2023-05-15 DIAGNOSIS — I480 Paroxysmal atrial fibrillation: Secondary | ICD-10-CM | POA: Diagnosis present

## 2023-05-15 DIAGNOSIS — L03115 Cellulitis of right lower limb: Secondary | ICD-10-CM | POA: Diagnosis present

## 2023-05-15 DIAGNOSIS — I5032 Chronic diastolic (congestive) heart failure: Secondary | ICD-10-CM | POA: Diagnosis present

## 2023-05-15 DIAGNOSIS — E08621 Diabetes mellitus due to underlying condition with foot ulcer: Secondary | ICD-10-CM

## 2023-05-15 DIAGNOSIS — I70235 Atherosclerosis of native arteries of right leg with ulceration of other part of foot: Secondary | ICD-10-CM | POA: Diagnosis not present

## 2023-05-15 DIAGNOSIS — Z888 Allergy status to other drugs, medicaments and biological substances status: Secondary | ICD-10-CM

## 2023-05-15 DIAGNOSIS — Z880 Allergy status to penicillin: Secondary | ICD-10-CM

## 2023-05-15 DIAGNOSIS — E0590 Thyrotoxicosis, unspecified without thyrotoxic crisis or storm: Secondary | ICD-10-CM | POA: Diagnosis present

## 2023-05-15 DIAGNOSIS — Z794 Long term (current) use of insulin: Secondary | ICD-10-CM | POA: Diagnosis not present

## 2023-05-15 DIAGNOSIS — E1169 Type 2 diabetes mellitus with other specified complication: Secondary | ICD-10-CM | POA: Diagnosis present

## 2023-05-15 DIAGNOSIS — Z91148 Patient's other noncompliance with medication regimen for other reason: Secondary | ICD-10-CM

## 2023-05-15 DIAGNOSIS — D509 Iron deficiency anemia, unspecified: Secondary | ICD-10-CM | POA: Diagnosis present

## 2023-05-15 DIAGNOSIS — Z832 Family history of diseases of the blood and blood-forming organs and certain disorders involving the immune mechanism: Secondary | ICD-10-CM

## 2023-05-15 DIAGNOSIS — F32A Depression, unspecified: Secondary | ICD-10-CM | POA: Diagnosis present

## 2023-05-15 DIAGNOSIS — F17210 Nicotine dependence, cigarettes, uncomplicated: Secondary | ICD-10-CM | POA: Diagnosis present

## 2023-05-15 DIAGNOSIS — M7751 Other enthesopathy of right foot: Secondary | ICD-10-CM

## 2023-05-15 DIAGNOSIS — L03119 Cellulitis of unspecified part of limb: Principal | ICD-10-CM

## 2023-05-15 DIAGNOSIS — Z91018 Allergy to other foods: Secondary | ICD-10-CM

## 2023-05-15 DIAGNOSIS — T465X5A Adverse effect of other antihypertensive drugs, initial encounter: Secondary | ICD-10-CM | POA: Diagnosis present

## 2023-05-15 DIAGNOSIS — L97512 Non-pressure chronic ulcer of other part of right foot with fat layer exposed: Secondary | ICD-10-CM | POA: Diagnosis not present

## 2023-05-15 DIAGNOSIS — Z833 Family history of diabetes mellitus: Secondary | ICD-10-CM

## 2023-05-15 DIAGNOSIS — M869 Osteomyelitis, unspecified: Secondary | ICD-10-CM | POA: Diagnosis present

## 2023-05-15 DIAGNOSIS — M7989 Other specified soft tissue disorders: Secondary | ICD-10-CM | POA: Diagnosis present

## 2023-05-15 DIAGNOSIS — Z83438 Family history of other disorder of lipoprotein metabolism and other lipidemia: Secondary | ICD-10-CM

## 2023-05-15 DIAGNOSIS — G8929 Other chronic pain: Secondary | ICD-10-CM | POA: Diagnosis present

## 2023-05-15 DIAGNOSIS — L97519 Non-pressure chronic ulcer of other part of right foot with unspecified severity: Secondary | ICD-10-CM | POA: Diagnosis present

## 2023-05-15 DIAGNOSIS — I482 Chronic atrial fibrillation, unspecified: Secondary | ICD-10-CM | POA: Diagnosis present

## 2023-05-15 DIAGNOSIS — Z8249 Family history of ischemic heart disease and other diseases of the circulatory system: Secondary | ICD-10-CM

## 2023-05-15 DIAGNOSIS — M7752 Other enthesopathy of left foot: Secondary | ICD-10-CM

## 2023-05-15 DIAGNOSIS — F411 Generalized anxiety disorder: Secondary | ICD-10-CM | POA: Diagnosis present

## 2023-05-15 DIAGNOSIS — I771 Stricture of artery: Secondary | ICD-10-CM | POA: Diagnosis present

## 2023-05-15 DIAGNOSIS — E785 Hyperlipidemia, unspecified: Secondary | ICD-10-CM | POA: Diagnosis present

## 2023-05-15 DIAGNOSIS — E1165 Type 2 diabetes mellitus with hyperglycemia: Secondary | ICD-10-CM | POA: Diagnosis present

## 2023-05-15 DIAGNOSIS — Z823 Family history of stroke: Secondary | ICD-10-CM

## 2023-05-15 DIAGNOSIS — Z7951 Long term (current) use of inhaled steroids: Secondary | ICD-10-CM

## 2023-05-15 DIAGNOSIS — T368X6A Underdosing of other systemic antibiotics, initial encounter: Secondary | ICD-10-CM | POA: Diagnosis present

## 2023-05-15 DIAGNOSIS — Z79899 Other long term (current) drug therapy: Secondary | ICD-10-CM

## 2023-05-15 DIAGNOSIS — E039 Hypothyroidism, unspecified: Secondary | ICD-10-CM | POA: Diagnosis present

## 2023-05-15 DIAGNOSIS — L97524 Non-pressure chronic ulcer of other part of left foot with necrosis of bone: Secondary | ICD-10-CM

## 2023-05-15 DIAGNOSIS — I2510 Atherosclerotic heart disease of native coronary artery without angina pectoris: Secondary | ICD-10-CM | POA: Diagnosis not present

## 2023-05-15 DIAGNOSIS — L03116 Cellulitis of left lower limb: Secondary | ICD-10-CM | POA: Diagnosis present

## 2023-05-15 DIAGNOSIS — I739 Peripheral vascular disease, unspecified: Secondary | ICD-10-CM | POA: Diagnosis not present

## 2023-05-15 DIAGNOSIS — I96 Gangrene, not elsewhere classified: Secondary | ICD-10-CM | POA: Diagnosis present

## 2023-05-15 DIAGNOSIS — Z7985 Long-term (current) use of injectable non-insulin antidiabetic drugs: Secondary | ICD-10-CM

## 2023-05-15 DIAGNOSIS — L97529 Non-pressure chronic ulcer of other part of left foot with unspecified severity: Secondary | ICD-10-CM | POA: Diagnosis present

## 2023-05-15 DIAGNOSIS — I110 Hypertensive heart disease with heart failure: Secondary | ICD-10-CM | POA: Diagnosis present

## 2023-05-15 DIAGNOSIS — I952 Hypotension due to drugs: Secondary | ICD-10-CM | POA: Diagnosis present

## 2023-05-15 DIAGNOSIS — E1152 Type 2 diabetes mellitus with diabetic peripheral angiopathy with gangrene: Secondary | ICD-10-CM | POA: Diagnosis present

## 2023-05-15 DIAGNOSIS — K219 Gastro-esophageal reflux disease without esophagitis: Secondary | ICD-10-CM | POA: Diagnosis present

## 2023-05-15 DIAGNOSIS — Z7901 Long term (current) use of anticoagulants: Secondary | ICD-10-CM

## 2023-05-15 DIAGNOSIS — I70245 Atherosclerosis of native arteries of left leg with ulceration of other part of foot: Secondary | ICD-10-CM | POA: Diagnosis not present

## 2023-05-15 LAB — I-STAT CG4 LACTIC ACID, ED
Lactic Acid, Venous: 0.9 mmol/L (ref 0.5–1.9)
Lactic Acid, Venous: 1.2 mmol/L (ref 0.5–1.9)

## 2023-05-15 LAB — URINALYSIS, W/ REFLEX TO CULTURE (INFECTION SUSPECTED)
Bacteria, UA: NONE SEEN
Bilirubin Urine: NEGATIVE
Glucose, UA: >=500 mg/dL — AB
Hgb urine dipstick: NEGATIVE
Ketones, ur: NEGATIVE mg/dL
Nitrite: NEGATIVE
Protein, ur: NEGATIVE mg/dL
Specific Gravity, Urine: 1.013 (ref 1.005–1.030)
pH: 5 (ref 5.0–8.0)

## 2023-05-15 LAB — SURGICAL PCR SCREEN
MRSA, PCR: NEGATIVE
Staphylococcus aureus: NEGATIVE

## 2023-05-15 LAB — CBC WITH DIFFERENTIAL/PLATELET
Abs Immature Granulocytes: 0.02 10*3/uL (ref 0.00–0.07)
Basophils Absolute: 0 10*3/uL (ref 0.0–0.1)
Basophils Relative: 0 %
Eosinophils Absolute: 0.1 10*3/uL (ref 0.0–0.5)
Eosinophils Relative: 1 %
HCT: 33.6 % — ABNORMAL LOW (ref 36.0–46.0)
Hemoglobin: 9.8 g/dL — ABNORMAL LOW (ref 12.0–15.0)
Immature Granulocytes: 0 %
Lymphocytes Relative: 31 %
Lymphs Abs: 2.2 10*3/uL (ref 0.7–4.0)
MCH: 25.6 pg — ABNORMAL LOW (ref 26.0–34.0)
MCHC: 29.2 g/dL — ABNORMAL LOW (ref 30.0–36.0)
MCV: 87.7 fL (ref 80.0–100.0)
Monocytes Absolute: 0.7 10*3/uL (ref 0.1–1.0)
Monocytes Relative: 10 %
Neutro Abs: 4 10*3/uL (ref 1.7–7.7)
Neutrophils Relative %: 58 %
Platelets: 278 10*3/uL (ref 150–400)
RBC: 3.83 MIL/uL — ABNORMAL LOW (ref 3.87–5.11)
RDW: 20.3 % — ABNORMAL HIGH (ref 11.5–15.5)
WBC: 7.1 10*3/uL (ref 4.0–10.5)
nRBC: 0 % (ref 0.0–0.2)

## 2023-05-15 LAB — COMPREHENSIVE METABOLIC PANEL WITH GFR
ALT: 16 U/L (ref 0–44)
AST: 21 U/L (ref 15–41)
Albumin: 3 g/dL — ABNORMAL LOW (ref 3.5–5.0)
Alkaline Phosphatase: 59 U/L (ref 38–126)
Anion gap: 12 (ref 5–15)
BUN: 10 mg/dL (ref 6–20)
CO2: 21 mmol/L — ABNORMAL LOW (ref 22–32)
Calcium: 8.7 mg/dL — ABNORMAL LOW (ref 8.9–10.3)
Chloride: 105 mmol/L (ref 98–111)
Creatinine, Ser: 0.93 mg/dL (ref 0.44–1.00)
GFR, Estimated: >60 mL/min (ref >60)
Glucose, Bld: 75 mg/dL (ref 70–99)
Potassium: 4.2 mmol/L (ref 3.5–5.1)
Sodium: 138 mmol/L (ref 135–145)
Total Bilirubin: 0.5 mg/dL (ref 0.0–1.2)
Total Protein: 6.9 g/dL (ref 6.5–8.1)

## 2023-05-15 LAB — IRON AND TIBC
Iron: 22 ug/dL — ABNORMAL LOW (ref 28–170)
Saturation Ratios: 10 % — ABNORMAL LOW (ref 10.4–31.8)
TIBC: 228 ug/dL — ABNORMAL LOW (ref 250–450)
UIBC: 206 ug/dL

## 2023-05-15 LAB — POC OCCULT BLOOD, ED: Fecal Occult Bld: NEGATIVE

## 2023-05-15 LAB — FERRITIN: Ferritin: 35 ng/mL (ref 11–307)

## 2023-05-15 LAB — HEMOGLOBIN AND HEMATOCRIT, BLOOD
HCT: 34.6 % — ABNORMAL LOW (ref 36.0–46.0)
Hemoglobin: 10.2 g/dL — ABNORMAL LOW (ref 12.0–15.0)

## 2023-05-15 LAB — GLUCOSE, CAPILLARY
Glucose-Capillary: 97 mg/dL (ref 70–99)
Glucose-Capillary: 73 mg/dL (ref 70–99)
Glucose-Capillary: 69 mg/dL — ABNORMAL LOW (ref 70–99)
Glucose-Capillary: 85 mg/dL (ref 70–99)

## 2023-05-15 MED ORDER — ATORVASTATIN CALCIUM 40 MG PO TABS
40.0000 mg | ORAL_TABLET | Freq: Every day | ORAL | Status: DC
  Administered 2023-05-15 – 2023-05-23 (×9): 40 mg via ORAL
  Filled 2023-05-15 (×9): qty 1

## 2023-05-15 MED ORDER — ACETAMINOPHEN 500 MG PO TABS
1000.0000 mg | ORAL_TABLET | Freq: Three times a day (TID) | ORAL | Status: DC
  Administered 2023-05-15 – 2023-05-24 (×25): 1000 mg via ORAL
  Filled 2023-05-15 (×26): qty 2

## 2023-05-15 MED ORDER — METRONIDAZOLE 500 MG/100ML IV SOLN
500.0000 mg | Freq: Two times a day (BID) | INTRAVENOUS | Status: DC
  Administered 2023-05-16 – 2023-05-17 (×3): 500 mg via INTRAVENOUS
  Filled 2023-05-15 (×3): qty 100

## 2023-05-15 MED ORDER — ACETAMINOPHEN 650 MG RE SUPP: 650.0000 mg | Freq: Three times a day (TID) | RECTAL | Status: DC

## 2023-05-15 MED ORDER — METRONIDAZOLE 500 MG/100ML IV SOLN: 500.0000 mg | Freq: Once | INTRAVENOUS | Status: DC

## 2023-05-15 MED ORDER — FLUOXETINE HCL 20 MG PO CAPS
20.0000 mg | ORAL_CAPSULE | Freq: Every day | ORAL | Status: DC
  Administered 2023-05-16 – 2023-05-24 (×8): 20 mg via ORAL
  Filled 2023-05-15 (×8): qty 1

## 2023-05-15 MED ORDER — INSULIN GLARGINE-YFGN 100 UNIT/ML ~~LOC~~ SOLN
25.0000 [IU] | Freq: Every day | SUBCUTANEOUS | Status: DC
  Administered 2023-05-15: 25 [IU] via SUBCUTANEOUS
  Filled 2023-05-15 (×3): qty 0.25

## 2023-05-15 MED ORDER — VANCOMYCIN HCL IN DEXTROSE 1-5 GM/200ML-% IV SOLN: 1000.0000 mg | Freq: Once | INTRAVENOUS | Status: DC

## 2023-05-15 MED ORDER — SODIUM CHLORIDE 0.9 % IV SOLN: 2.0000 g | Freq: Once | INTRAVENOUS | Status: DC

## 2023-05-15 MED ORDER — ACETAMINOPHEN 325 MG PO TABS: 650.0000 mg | ORAL_TABLET | Freq: Four times a day (QID) | ORAL | Status: DC | PRN

## 2023-05-15 MED ORDER — BUPROPION HCL ER (XL) 150 MG PO TB24
300.0000 mg | ORAL_TABLET | Freq: Every morning | ORAL | Status: DC
Start: 2023-05-16 — End: 2023-05-24
  Administered 2023-05-16 – 2023-05-24 (×8): 300 mg via ORAL
  Filled 2023-05-15 (×8): qty 2

## 2023-05-15 MED ORDER — SODIUM CHLORIDE 0.9 % IV SOLN
2.0000 g | INTRAVENOUS | Status: DC
  Administered 2023-05-16 – 2023-05-21 (×6): 2 g via INTRAVENOUS
  Filled 2023-05-15 (×7): qty 20

## 2023-05-15 MED ORDER — VANCOMYCIN HCL 2000 MG/400ML IV SOLN
2000.0000 mg | Freq: Once | INTRAVENOUS | Status: AC
Start: 2023-05-15 — End: 2023-05-15
  Administered 2023-05-15: 2000 mg via INTRAVENOUS
  Filled 2023-05-15: qty 400

## 2023-05-15 MED ORDER — MORPHINE SULFATE ER 15 MG PO TBCR
15.0000 mg | EXTENDED_RELEASE_TABLET | Freq: Two times a day (BID) | ORAL | Status: DC
  Administered 2023-05-15 – 2023-05-24 (×17): 15 mg via ORAL
  Filled 2023-05-15 (×17): qty 1

## 2023-05-15 MED ORDER — APIXABAN 5 MG PO TABS
5.0000 mg | ORAL_TABLET | Freq: Two times a day (BID) | ORAL | Status: DC
  Administered 2023-05-15 – 2023-05-16 (×3): 5 mg via ORAL
  Filled 2023-05-15 (×3): qty 1

## 2023-05-15 MED ORDER — VANCOMYCIN HCL 1500 MG/300ML IV SOLN
1500.0000 mg | INTRAVENOUS | Status: DC
  Administered 2023-05-16 – 2023-05-19 (×4): 1500 mg via INTRAVENOUS
  Filled 2023-05-15 (×5): qty 300

## 2023-05-15 MED ORDER — SODIUM CHLORIDE 0.9 % IV SOLN
2.0000 g | Freq: Once | INTRAVENOUS | Status: AC
  Administered 2023-05-15: 2 g via INTRAVENOUS
  Filled 2023-05-15: qty 20

## 2023-05-15 MED ORDER — INSULIN ASPART 100 UNIT/ML IJ SOLN
0.0000 [IU] | Freq: Three times a day (TID) | INTRAMUSCULAR | Status: DC
  Administered 2023-05-18 – 2023-05-21 (×5): 2 [IU] via SUBCUTANEOUS
  Administered 2023-05-22 (×2): 3 [IU] via SUBCUTANEOUS

## 2023-05-15 MED ORDER — METHIMAZOLE 5 MG PO TABS
5.0000 mg | ORAL_TABLET | ORAL | Status: DC
  Administered 2023-05-16 – 2023-05-24 (×7): 5 mg via ORAL
  Filled 2023-05-15 (×8): qty 1

## 2023-05-15 MED ORDER — ACETAMINOPHEN 650 MG RE SUPP: 650.0000 mg | Freq: Four times a day (QID) | RECTAL | Status: DC | PRN

## 2023-05-15 MED ORDER — GABAPENTIN 300 MG PO CAPS
600.0000 mg | ORAL_CAPSULE | Freq: Every day | ORAL | Status: DC
  Administered 2023-05-15 – 2023-05-23 (×9): 600 mg via ORAL
  Filled 2023-05-15 (×9): qty 2

## 2023-05-15 MED ORDER — MUPIROCIN 2 % EX OINT
1.0000 | TOPICAL_OINTMENT | Freq: Two times a day (BID) | CUTANEOUS | Status: AC
  Administered 2023-05-15 – 2023-05-20 (×8): 1 via NASAL
  Filled 2023-05-15: qty 22

## 2023-05-15 MED ORDER — PANTOPRAZOLE SODIUM 40 MG PO TBEC
40.0000 mg | DELAYED_RELEASE_TABLET | Freq: Every day | ORAL | Status: DC
  Administered 2023-05-16 – 2023-05-24 (×8): 40 mg via ORAL
  Filled 2023-05-15 (×8): qty 1

## 2023-05-15 MED ORDER — METRONIDAZOLE 500 MG/100ML IV SOLN
500.0000 mg | Freq: Once | INTRAVENOUS | Status: AC
  Administered 2023-05-15: 500 mg via INTRAVENOUS
  Filled 2023-05-15: qty 100

## 2023-05-15 MED ORDER — OXYCODONE HCL 5 MG PO TABS: 5.0000 mg | ORAL_TABLET | Freq: Four times a day (QID) | ORAL | Status: DC | PRN

## 2023-05-15 NOTE — ED Triage Notes (Signed)
 Pt was sent here from podiatrist to check for cellulitis. Pt has bilateral toe swelling and redness to calf. Denies fevers. C/O drainage coming from feet. Hx of diabetes.

## 2023-05-15 NOTE — Progress Notes (Signed)
 Subjective:  Patient ID: Carmen Armstrong, female    DOB: Jun 04, 1965,   MRN: [id number]  No chief complaint on file.   58 y.o. female presents for concern of draining swelling and pain to left second toe and some changes to right great toe as well. She was seen by her PCP and advised to follow up here. She was given some antibiotics but has not taken yet. She does relates not feeling well. Denies fever, but does relate some  chills. Here with caregiver who relates she has not been speaking clearly either.  Relates burning and tingling in their feet. Patient is diabetic and last A1c was  Lab Results  Component Value Date   HGBA1C 13.1 (A) 04/13/2023   .    PCP:  Lewis Moccasin, MD    . Denies any other pedal complaints. Denies n/v/f/c.   Past Medical History:  Diagnosis Date   Arrhythmia    Arthritis    Atrial fibrillation (HCC)    CAD (coronary artery disease)    Chronic lower back pain    Depression    Diabetes mellitus without complication (HCC)    Eczema    GAD (generalized anxiety disorder)    GERD (gastroesophageal reflux disease)    H/O vitamin D deficiency    Heart failure (HCC)    Hyperlipidemia    Hypertension    Hypothyroidism    Insomnia    Peripheral neuropathy    PVD (peripheral vascular disease) (HCC)     Objective:  Physical Exam: Vascular: DP/PT pulses 1/4 bilateral. CFT <3 seconds. Absent hair growth on digits. Edema noted to bilateral lower extremities. Xerosis noted bilaterally.  Skin. No lacerations or abrasions bilateral feet. Nails 1-5 bilateral remaining are thickened discolored and elongated with subungual debris. Ulceration noted to distal left second digit with near probe to bone and some purulence noted as well. Ulceration noted distal hallux on right with granular base. No probe to bone. No purulence erythema or edema. Erythema and edema noted to bilateral lower extremities and calf area. Musculoskeletal: MMT 5/5 bilateral lower  extremities in DF, PF, Inversion and Eversion. Deceased ROM in DF of ankle joint. 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 Hammered digits 1-5 bilateral.  Neurological: Sensation intact to light touch. Protective sensation diminished bilateral.    Assessment:   1. 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 Diabetic polyneuropathy associated with type 2 diabetes mellitus (HCC)   3. Diabetic ulcer of toe of right foot associated with type 2 diabetes mellitus, with fat layer exposed (HCC)              Plan:  Patient was evaluated and treated and all questions answered. Ulcer distal left second digit and right hallux  -X-rays reviewed. Possible subtle erosive changes noted to dorsal tip of second phalanx compared to previous and possible some changes noted to right hallux as well.  -Bilateral wounds evaluated and debrided with probe to bone on second digit on left.  -Discussed findings with patient and given concern for worsening infection and systemic symptoms and concern for osteomyelitis would recommend patient go to the emergency room. She will need IV abx, MRI, vascular studies and amputation of the toe.  Notified Dr. Annamary Rummage who is on call  Patient will head over to [facility] ER.      No follow-ups on file.    No follow-ups on file.  Louann Sjogren, DPM

## 2023-05-15 NOTE — H&P (Signed)
 History and Physical    Patient: Carmen Armstrong:[id number] DOB: [birth year] DOA: 05/15/2023 DOS: the patient was seen and examined on 05/15/2023 PCP: Lewis Moccasin, MD  Patient coming from: Home  Chief Complaint:  Chief Complaint  Patient presents with   Toe Injury   HPI: Carmen Armstrong is a 58 y.o. female with medical history significant of poorly controlled T2DM with polyneuropathy, atrial fibrillation on eliquis, HTN, chronic diastolic HF, CAD, HLD, PAD, hypothyroidism, depression, anxiety presenting to the ED with a diabetic toe wound.  Patient evaluated by podiatry earlier today, found to have ulcers on right big toe and left second toe, both with surrounding swelling and drainage. Apparently noted by PCP earlier and was advised to f/u with podiatry. She was prescribed antibiotics by her PCP but she did not start taking them. Per podiatry note, unclear timing as to when these first developed. Also unable to elicit additional information from patient to help delineate timeline.   Patient does note that she has not been feeling well. Denies any fevers, chills, chest pain, palpitations, SHOB, abdominal pain. Does not pain in bilateral feet/lower legs. Has been having burning pain in both feet as well.   Patient denies any bleeding events at home. Has been adherent with her eliquis. No hematochezia, melena, or hematemesis per patient.  ED course: Vital signs stable aside from low BP (107/51). CBC with hgb 9.8 (down from 12.1 about 3 weeks ago), no leukocytosis. CMP unremarkable, kidney function at baseline. Lactic acid normal x2. FOBT negative. Blood cultures collected. Podiatry, Dr. Annamary Rummage, consulted by ED provider. Empirically started on vancomycin, flagyl, rocephin. [facility] hospitalist asked to evaluate patient for admission.  Review of Systems: As mentioned in the history of present illness. All other systems reviewed and are negative. Past Medical History:  Diagnosis  Date   Arrhythmia    Arthritis    Atrial fibrillation (HCC)    CAD (coronary artery disease)    Chronic lower back pain    Depression    Diabetes mellitus without complication (HCC)    Eczema    GAD (generalized anxiety disorder)    GERD (gastroesophageal reflux disease)    H/O vitamin D deficiency    Heart failure (HCC)    Hyperlipidemia    Hypertension    Hypothyroidism    Insomnia    Peripheral neuropathy    PVD (peripheral vascular disease) (HCC)    Past Surgical History:  Procedure Laterality Date   ANGIOPLASTY     legs   BACK SURGERY     5 different times   BREAST BIOPSY Right 07/24/2022   MM RT BREAST BX W LOC DEV 1ST LESION IMAGE BX SPEC STEREO GUIDE 07/24/2022 [facility]   CARDIAC CATHETERIZATION  2020   REPLACEMENT TOTAL HIP W/  RESURFACING IMPLANTS Right 2018   RIGHT AND LEFT HEART CATH     ROTATOR CUFF REPAIR Right 2016   SPINAL CORD STIMULATOR INSERTION N/A 05/08/2022   Procedure: PLACEMENT OF SPINAL CORD STIMULATOR;  Surgeon: Venita Lick, MD;  Location: [facility] OR;  Service: Orthopedics;  Laterality: N/A;   SPINE SURGERY  2014,2016,2017,2019   toe amputated  09/2018   5th left toe   TOE AMPUTATION Left    great toe on left side   Social History:  reports that she has been smoking cigarettes. She has a 15 pack-year smoking history. She has never used smokeless tobacco. She reports that she does not currently use alcohol. She reports that she does not currently use  drugs.  Allergies  Allergen Reactions   Corylus    Omeprazole     Other reaction(s): Not available   Other     Hazelnuts- SOB   Pear    Penicillin G     whelps   Trulicity [Dulaglutide]     Large itchy bumps    Family History  Problem Relation Age of Onset   Hypertension Mother    Hyperlipidemia Mother    Diabetes Mother    Stroke Mother    Clotting disorder Father    Hypertension Father    Hyperlipidemia Father    Hyperlipidemia Sister    Diabetes Brother    Neuropathy  Brother     Prior to Admission medications   Medication Sig Start Date End Date Taking? Authorizing Provider  amLODipine (NORVASC) 10 MG tablet Take 1 tablet (10 mg total) by mouth daily. Patient taking differently: Take 10 mg by mouth at bedtime. 03/22/23   Tolia, Sunit, DO  atorvastatin (LIPITOR) 40 MG tablet Take 40 mg by mouth at bedtime. 08/08/19   [provider]  buPROPion (WELLBUTRIN XL) 300 MG 24 hr tablet Take 300 mg by mouth every morning. 04/03/22   [provider]  candesartan (ATACAND) 4 MG tablet Take 4 mg by mouth daily. 08/13/19   [provider]  Dapagliflozin Pro-metFORMIN ER (XIGDUO XR) 06-998 MG TB24 Take 2 tablets by mouth daily. 04/13/23   Shamleffer, Konrad Dolores, MD  diphenhydrAMINE (SIMPLY SLEEP) 25 MG tablet Take 25 mg by mouth at bedtime.    [provider]  ELIQUIS 5 MG TABS tablet Take 1 tablet (5 mg total) by mouth 2 (two) times daily. 05/02/23   Tolia, Sunit, DO  FLUoxetine (PROZAC) 20 MG capsule Take 20 mg by mouth daily. 04/04/22   [provider]  gabapentin (NEURONTIN) 600 MG tablet Take 600 mg by mouth at bedtime. 12/26/22   [provider]  insulin aspart (NOVOLOG FLEXPEN) 100 UNIT/ML FlexPen Inject 10-20 Units into the skin 3 (three) times daily with meals. 04/16/23   Shamleffer, Konrad Dolores, MD  isosorbide mononitrate (IMDUR) 60 MG 24 hr tablet Take 1 tablet (60 mg total) by mouth in the morning. 03/22/23   Tolia, Sunit, DO  LINZESS 145 MCG CAPS capsule Take 145 mcg by mouth daily. 04/03/22   [provider]  methimazole (TAPAZOLE) 5 MG tablet Take 1 tablet (5 mg total) by mouth as directed. 1 tablet Monday through Saturday, none on Sundays 03/20/23   Shamleffer, Konrad Dolores, MD  metoprolol succinate (TOPROL-XL) 100 MG 24 hr tablet Take 100 mg by mouth daily. 06/12/19   [provider]  naloxone [facility]) nasal spray 4 mg/0.1 mL Place 1 spray into the nose as needed (OD).    [provider]  omeprazole (PRILOSEC) 20 MG capsule Take 20 mg by mouth daily. 06/15/21   [provider]  oseltamivir (TAMIFLU) 75 MG capsule Take 1 capsule (75 mg total) by mouth 2 (two) times daily. 04/21/23   Lorin Glass, MD  spironolactone (ALDACTONE) 25 MG tablet Take 1 tablet (25 mg total) by mouth daily. 03/22/23   Tolia, Sunit, DO  tirzepatide [facility]) 10 MG/0.5ML Pen Inject 10 mg into the skin once a week. 04/13/23   Shamleffer, Konrad Dolores, MD  TRESIBA FLEXTOUCH 100 UNIT/ML FlexTouch Pen Inject 64 Units into the skin daily.    [provider]  zolpidem (AMBIEN CR) 6.25 MG CR tablet Take 12.5 mg by mouth at bedtime. 12/26/22   [provider]    Physical Exam: Vitals:   05/15/23 1312 05/15/23 1317  BP:  (!) 107/51  Pulse:  68  Resp:  17  Temp:  98.4 F (36.9 C)  SpO2:  91%  Weight: 101.6 kg   Height: 5\' 9"  (1.753 m)    Physical Exam Constitutional:      Appearance: Normal appearance. She is obese. She is not ill-appearing.  HENT:     Head: Normocephalic and atraumatic.     Mouth/Throat:     Mouth: Mucous membranes are moist.     Pharynx: Oropharynx is clear. No oropharyngeal exudate.  Eyes:     General: No scleral icterus.    Extraocular Movements: Extraocular movements intact.     Conjunctiva/sclera: Conjunctivae normal.     Pupils: Pupils are equal, round, and reactive to light.  Cardiovascular:     Rate and Rhythm: Normal rate and regular rhythm.     Heart sounds: Normal heart sounds. No murmur heard.    No friction rub. No gallop.     Comments: No obvious JVD on exam. Unable to palpate DP pulses bilaterally. Pulmonary:     Effort: Pulmonary effort is normal. No respiratory distress.     Breath sounds: Normal breath sounds. No wheezing, rhonchi or rales.  Abdominal:     General: Bowel sounds are normal. There is no distension.     Palpations: Abdomen is soft.     Tenderness: There is no abdominal tenderness. There is no guarding or  rebound.  Musculoskeletal:        General: Normal range of motion.     Cervical back: Normal range of motion.     Comments: 2+ pitting edema in bilateral LE up to knees.  Skin:    General: Skin is warm and dry.     Comments: Right big toe and left 2nd toe bandaged by podiatry, no active drainage noted. Does have chronic discoloration of bilateral feet, likely venous stasis changes. She does appear to have overlying redness/erythema extending upwards to the lower shins bilaterally. 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  Neurological:     General: No focal deficit present.     Mental Status: She is alert and oriented to person, place, and time.  Psychiatric:        Mood and Affect: Mood normal.        Behavior: Behavior normal.       Data Reviewed:  There are no new results to review at this time.    Latest Ref Rng & Units 05/15/2023    1:16 PM 04/21/2023    4:24 AM 04/20/2023    4:14 AM  CBC  WBC 4.0 - 10.5 K/uL 7.1  5.0  6.8   Hemoglobin 12.0 - 15.0 g/dL 9.8  16.1  09.6   Hematocrit 36.0 - 46.0 % 33.6  38.7  40.7   Platelets 150 - 400 K/uL 278  181  210       Latest Ref Rng & Units 05/15/2023    1:16 PM 04/21/2023    4:24 AM 04/20/2023    4:14 AM  CMP  Glucose 70 - 99 mg/dL 75  045  409   BUN 6 - 20 mg/dL 10  20  11    Creatinine 0.44 - 1.00 mg/dL 8.11  9.14  7.82   Sodium 135 - 145 mmol/L 138  132  134   Potassium 3.5 - 5.1 mmol/L 4.2  3.9  3.9   Chloride 98 - 111 mmol/L 105  100  104   CO2 22 - 32 mmol/L 21  23  23    Calcium 8.9 - 10.3 mg/dL 8.7  8.6  8.6   Total Protein 6.5 - 8.1 g/dL 6.9   7.1   Total Bilirubin 0.0 - 1.2 mg/dL 0.5   0.9   Alkaline Phos 38 - 126 U/L 59   91   AST 15 - 41 U/L 21   19   ALT 0 - 44 U/L 16   15    Lactic Acid, Venous    Component Value Date/Time   LATICACIDVEN 1.2 05/15/2023 1546    Assessment and Plan: No notes have been filed under this hospital service. 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 X-rays showed subtle erosive changes to dorsal tip of second left phalanx and some possible changes to right hallux as well. Bilateral wounds were debrided by podiatry with probe to bone on second digit of left foot. Exam here also with likely overlying cellulitis. Currently awaiting MRI findings. She is empirically on IV rocephin, flagyl, vancomycin. She is afebrile and without leukocytosis currently. I am unable to palpate her DP pulses bilaterally and thus will obtain ABI's for better evaluation (known history of PVD). -podiatry consulted, appreciate management -IV rocephin, flagyl, vancomycin -f/u MRI of both feet -f/u blood cultures -heart healthy diet, NPO @ midnight -f/u ABIs -pain control with scheduled tylenol and resumed home MS contin 15mg  BID -place in observation  Normocytic Anemia -hgb 9.8 (down from 12.1 about 3 weeks ago), with elevated RDW -no reported bleeding events, FOBT negative -trend hgb curve closely, transfuse if hgb <7 -resume home eliquis -f/u iron studies  Poorly controlled T2DM with polyneuropathy Last A1c 13.1% on 04/13/2023. Patient is on tresiba 64u daily, novolog 10-20u TID with meals, dapagliflozin-metformin 5-1000mg  BID, and tirzepatide 10mg  weekly at home. Blood glucose 75 on CMP today. She has not taken any of her home insulin today. Will start moderate SSI and trend CBGs. Will provide slightly less than half of home long-acting insulin dose given she will be NPO after midnight, have advised RN staff to hold if CBGs remain low. -moderate SSI -semglee 25u at bedtime, hold if CBGs remain <120 -trend CBGs, goal 140-180 -resume home gabapentin 600mg  qhs  Hypotension HTN Patient on toprol-xl 100mg  daily, spironolactone  25mg  daily, imdur 60mg  daily, candesartan 4mg  daily, norvasc 10mg  daily at home. BP 107/51 on arrival. Will hold home antihypertensives. Fortunately MAP >65 and normal lactic acid. -holding home antihypertensives -trend BP curve, goal MAP >65  Parosymal Atrial fibrillation -resume home eliquis -holding home toprol-xl given hypotension -currently rate controlled with regular rhythm -telemetry  Chronic diastolic HF ECHO 1914 with LVEF 65%, G1DD. Patient on toprol-xl, spironolactone, imdur, and candesartan at home. Holding given low BP. She does have pitting edema in bilateral LE up to knees, but unclear if this is due to underlying infection and associated swelling. Not on any diuretic at home. -holding home GDMT given hypotension -strict I/O's, daily weights  CAD PVD HLD -resume home eliquis -resume home lipitor 40mg  at bedtime -checking vascular ABIs for assessment of PVD  Hypothyroidism -resume home methimazole 5mg  daily  Depression and anxiety -resume home fluoxetine 20mg  daily -resume home wellbutrin  300mg  daily  Chronic pain -PDMP reviewed, patient on MS contin 15mg  BID -also noted suboxone on PDMP, unclear if taking so will hold for now -will resume home MS contin as noted above    Advance Care Planning:   Code Status: Full Code   Consults: podiatry  Family Communication: no family at bedside  Severity of Illness: The appropriate patient status for this patient is INPATIENT. Inpatient status is judged to be reasonable and necessary in order to provide the required intensity of service to ensure the patient's safety. The patient's presenting symptoms, physical exam findings, and initial radiographic and laboratory data in the context of their chronic comorbidities is felt to place them at high risk for further clinical deterioration. Furthermore, it is not anticipated that the patient will be medically stable for discharge from the hospital within 2 midnights of  admission.   * I certify that at the point of admission it is my clinical judgment that the patient will require inpatient hospital care spanning beyond 2 midnights from the point of admission due to high intensity of service, high risk for further deterioration and high frequency of surveillance required.*  Portions of this note were generated with Dragon dictation software. Dictation errors may occur despite best attempts at proofreading.  Author: Briscoe Burns, MD 05/15/2023 3:28 PM  For on call review [email or web address].

## 2023-05-15 NOTE — Progress Notes (Signed)
 Regarding MRI. Pt stated there is a spinal stimulator in back. Pt stated it is not fully charged and does not have the remote.  Dexcom on right upper arm.  On admission pt is very lethargic, difficult to understand at times and alert and orientated x 3.  Pt is confused at times.  Pt states she lives alone with her dog.

## 2023-05-15 NOTE — ED Notes (Signed)
 Patient was satting 87 to 89 on room air was placed on Nasal canula 3 Liters saturation is now 93

## 2023-05-15 NOTE — Progress Notes (Signed)
 Pharmacy Antibiotic Note  Carmen Armstrong is a 58 y.o. female for which pharmacy has been consulted for vancomycin dosing for  osteomyelitis .  Patient with a history of T2DM, AF on eliquis, HTN, HF, CAD, HLD, PAD, hypothyroidism, depression, anxiety. Patient presenting with diabetic toe wound. XR w/ concern for erosion.  SCr 0.93 WBC 7.1; LA 1.2; T 98.2; HR 67; RR 18  Plan: Metronidazole per MD Ceftriaxone per MD Vancomycin 2000 mg once then 1500 mg q24hr (eAUC 479.4) unless change in renal function Monitor WBC, fever, renal function, cultures De-escalate when able Levels at steady state  Height: 5\' 9"  (175.3 cm) Weight: 101.6 kg (224 lb) IBW/kg (Calculated) : 66.2  Temp (24hrs), Avg:98.4 F (36.9 C), Min:98.4 F (36.9 C), Max:98.4 F (36.9 C)  Recent Labs  Lab 05/15/23 1316 05/15/23 1330  WBC 7.1  --   CREATININE 0.93  --   LATICACIDVEN  --  0.9    Estimated Creatinine Clearance: 83.7 mL/min (by C-G formula based on SCr of 0.93 mg/dL).    Allergies  Allergen Reactions   Corylus    Omeprazole     Other reaction(s): Not available   Other     Hazelnuts- SOB   Pear    Penicillin G     whelps   Trulicity [Dulaglutide]     Large itchy bumps   Microbiology results: Pending  Thank you for allowing pharmacy to be a part of this patient's care.  Delmar Landau, PharmD, BCPS 05/15/2023 3:28 PM ED Clinical Pharmacist -  [phone]

## 2023-05-15 NOTE — ED Provider Notes (Signed)
 [place] EMERGENCY DEPARTMENT AT [facility] Provider Note   CSN: [id number] Arrival date & time: 05/15/23  1300     History DM, toe amputation  Chief Complaint  Patient presents with   Toe Injury    Analiz Tvedt is a 58 y.o. female.  58 y.o female with a PMH of DM, toe amputation presents to the ED from podiatry office for further management of cellulitis.  Patient has had bilateral redness, and feet pain for the past several days, evaluated by PCP, given a prescription for antibiotics which she has not taken.  On today's visit, she was seen by podiatry who referred her to the emergency department for concern of systemic infection.  Patient does endorse more swelling to lower extremities exacerbated with ambulation but alleviated by raising up her legs, she does have a history of multiple toe amputations.  Feels overall weak.  The history is provided by the patient.       Home Medications Prior to Admission medications   Medication Sig Start Date End Date Taking? Authorizing Provider  amLODipine (NORVASC) 10 MG tablet Take 1 tablet (10 mg total) by mouth daily. Patient taking differently: Take 10 mg by mouth at bedtime. 03/22/23   Tolia, Sunit, DO  atorvastatin (LIPITOR) 40 MG tablet Take 40 mg by mouth at bedtime. 08/08/19   [provider]  buPROPion (WELLBUTRIN XL) 300 MG 24 hr tablet Take 300 mg by mouth every morning. 04/03/22   [provider]  candesartan (ATACAND) 4 MG tablet Take 4 mg by mouth daily. 08/13/19   [provider]  Dapagliflozin Pro-metFORMIN ER (XIGDUO XR) 06-998 MG TB24 Take 2 tablets by mouth daily. 04/13/23   Shamleffer, Konrad Dolores, MD  diphenhydrAMINE (SIMPLY SLEEP) 25 MG tablet Take 25 mg by mouth at bedtime.    [provider]  ELIQUIS 5 MG TABS tablet Take 1 tablet (5 mg total) by mouth 2 (two) times daily. 05/02/23   Tolia, Sunit, DO  FLUoxetine (PROZAC) 20 MG capsule Take 20 mg by mouth daily.  04/04/22   [provider]  gabapentin (NEURONTIN) 600 MG tablet Take 600 mg by mouth at bedtime. 12/26/22   [provider]  insulin aspart (NOVOLOG FLEXPEN) 100 UNIT/ML FlexPen Inject 10-20 Units into the skin 3 (three) times daily with meals. 04/16/23   Shamleffer, Konrad Dolores, MD  isosorbide mononitrate (IMDUR) 60 MG 24 hr tablet Take 1 tablet (60 mg total) by mouth in the morning. 03/22/23   Tolia, Sunit, DO  LINZESS 145 MCG CAPS capsule Take 145 mcg by mouth daily. 04/03/22   [provider]  methimazole (TAPAZOLE) 5 MG tablet Take 1 tablet (5 mg total) by mouth as directed. 1 tablet Monday through Saturday, none on Sundays 03/20/23   Shamleffer, Konrad Dolores, MD  metoprolol succinate (TOPROL-XL) 100 MG 24 hr tablet Take 100 mg by mouth daily. 06/12/19   [provider]  naloxone [facility]) nasal spray 4 mg/0.1 mL Place 1 spray into the nose as needed (OD).    [provider]  omeprazole (PRILOSEC) 20 MG capsule Take 20 mg by mouth daily. 06/15/21   [provider]  oseltamivir (TAMIFLU) 75 MG capsule Take 1 capsule (75 mg total) by mouth 2 (two) times daily. 04/21/23   Lorin Glass, MD  spironolactone (ALDACTONE) 25 MG tablet Take 1 tablet (25 mg total) by mouth daily. 03/22/23   Tolia, Sunit, DO  tirzepatide [facility]) 10 MG/0.5ML Pen Inject 10 mg into the skin once  a week. 04/13/23   Shamleffer, Konrad Dolores, MD  TRESIBA FLEXTOUCH 100 UNIT/ML FlexTouch Pen Inject 64 Units into the skin daily.    [provider]  zolpidem (AMBIEN CR) 6.25 MG CR tablet Take 12.5 mg by mouth at bedtime. 12/26/22   [provider]      Allergies    Corylus, Omeprazole, Other, Pear, Penicillin g, and Trulicity [dulaglutide]    Review of Systems   Review of Systems  Constitutional:  Negative for chills and fever.  Respiratory:  Negative for shortness of breath.   Cardiovascular:  Positive for leg swelling. Negative for chest pain.   Gastrointestinal:  Negative for abdominal pain.  Skin:  Positive for wound.  All other systems reviewed and are negative.   Physical Exam Updated Vital Signs BP (!) 107/51   Pulse 68   Temp 98.4 F (36.9 C)   Resp 17   Ht 5\' 9"  (1.753 m)   Wt 101.6 kg   SpO2 91%   BMI 33.08 kg/m  Physical Exam Vitals and nursing note reviewed.  Constitutional:      Appearance: Normal appearance.  HENT:     Head: Normocephalic and atraumatic.     Mouth/Throat:     Mouth: Mucous membranes are moist.  Cardiovascular:     Rate and Rhythm: Normal rate.     Comments: Feet are warm, palpable right DP pulses, toes are wrapped in coband with multiple missing from right and left foot.  Pulmonary:     Effort: Pulmonary effort is normal.  Abdominal:     General: Abdomen is flat.  Musculoskeletal:     Cervical back: Normal range of motion and neck supple.     Right lower leg: 1+ Pitting Edema present.     Left lower leg: 1+ Pitting Edema present.  Skin:    General: Skin is warm and dry.     Findings: Erythema present.  Neurological:     Mental Status: She is alert and oriented to person, place, and time.     ED Results / Procedures / Treatments   Labs (all labs ordered are listed, but only abnormal results are displayed) Labs Reviewed  COMPREHENSIVE METABOLIC PANEL WITH GFR - Abnormal; Notable for the following components:      Result Value   CO2 21 (*)    Calcium 8.7 (*)    Albumin 3.0 (*)    All other components within normal limits  CBC WITH DIFFERENTIAL/PLATELET - Abnormal; Notable for the following components:   RBC 3.83 (*)    Hemoglobin 9.8 (*)    HCT 33.6 (*)    MCH 25.6 (*)    MCHC 29.2 (*)    RDW 20.3 (*)    All other components within normal limits  CULTURE, BLOOD (ROUTINE X 2)  CULTURE, BLOOD (ROUTINE X 2)  URINALYSIS, W/ REFLEX TO CULTURE (INFECTION SUSPECTED)  I-STAT CG4 LACTIC ACID, ED  I-STAT CG4 LACTIC ACID, ED    EKG None  Radiology No results  found.  Procedures Procedures    Medications Ordered in ED Medications  cefTRIAXone (ROCEPHIN) 2 g in sodium chloride 0.9 % 100 mL IVPB (has no administration in time range)    And  metroNIDAZOLE (FLAGYL) IVPB 500 mg (has no administration in time range)    And  vancomycin (VANCOREADY) IVPB 2000 mg/400 mL (has no administration in time range)    ED Course/ Medical Decision Making/ A&P  Medical Decision Making Amount and/or Complexity of Data Reviewed Labs: ordered. Radiology: ordered.  Risk Prescription drug management. Decision regarding hospitalization.   Patient sent in by podiatrist Dr. Ralene Cork, who recommended patient be evaluated for cellulitis versus osteomyelitis.  Patient was given antibiotics by her primary care physician earlier in the week, she did not take these and was seen by podiatry today and they felt that her feet are likely worsening.  She does have 1+ bilateral pitting edema.  She has a prior history of multiple amputations of her toes, they are concerned for osteomyelitis as the x-ray did show some concerns.  CBC shows no leukocytosis, hemoglobin is 9.8, Actiq acid is negative.  I did discuss this case with podiatry on-call via secure chat Dr. Annamary Rummage, who recommends MRI of the foot with and without contrast, these have been ordered.  Patient was started on antibiotics such as Rocephin, Flagyl, vancomycin in order to treat for suspected osteomyelitis.  Will place call for hospitalist team for admission.  3:13 PM spoke to hospitalist service, who recommended FOBT, I made them aware that patient is in the hallway and will need a rectal exam although patient denies any bloody stools.Will obtain rectal exam.    Rectal exam obtained no gross blood noted, she patient reports she had the same test done at her PCPs office, and it was negative.  Patient admitted to hospital service.  Portions of this note were generated with Administrator, sports. Dictation errors may occur despite best attempts at proofreading.  Final Clinical Impression(s) / ED Diagnoses Final diagnoses:  Cellulitis of lower extremity, unspecified laterality    Rx / DC Orders ED Discharge Orders     None         Claude Manges, PA-C 05/15/23 1527    Pricilla Loveless, MD 05/16/23 2239

## 2023-05-16 ENCOUNTER — Encounter (HOSPITAL_COMMUNITY)

## 2023-05-16 DIAGNOSIS — M869 Osteomyelitis, unspecified: Secondary | ICD-10-CM | POA: Diagnosis not present

## 2023-05-16 LAB — BASIC METABOLIC PANEL WITH GFR
Anion gap: 8 (ref 5–15)
BUN: 9 mg/dL (ref 6–20)
CO2: 20 mmol/L — ABNORMAL LOW (ref 22–32)
Calcium: 8.3 mg/dL — ABNORMAL LOW (ref 8.9–10.3)
Chloride: 110 mmol/L (ref 98–111)
Creatinine, Ser: 0.81 mg/dL (ref 0.44–1.00)
GFR, Estimated: >60 mL/min (ref >60)
Glucose, Bld: 71 mg/dL (ref 70–99)
Potassium: 4.1 mmol/L (ref 3.5–5.1)
Sodium: 138 mmol/L (ref 135–145)

## 2023-05-16 LAB — GLUCOSE, CAPILLARY
Glucose-Capillary: 111 mg/dL — ABNORMAL HIGH (ref 70–99)
Glucose-Capillary: 107 mg/dL — ABNORMAL HIGH (ref 70–99)
Glucose-Capillary: 72 mg/dL (ref 70–99)
Glucose-Capillary: 120 mg/dL — ABNORMAL HIGH (ref 70–99)
Glucose-Capillary: 91 mg/dL (ref 70–99)

## 2023-05-16 LAB — CBC
HCT: 30.1 % — ABNORMAL LOW (ref 36.0–46.0)
Hemoglobin: 9 g/dL — ABNORMAL LOW (ref 12.0–15.0)
MCH: 25.9 pg — ABNORMAL LOW (ref 26.0–34.0)
MCHC: 29.9 g/dL — ABNORMAL LOW (ref 30.0–36.0)
MCV: 86.7 fL (ref 80.0–100.0)
Platelets: 236 10*3/uL (ref 150–400)
RBC: 3.47 MIL/uL — ABNORMAL LOW (ref 3.87–5.11)
RDW: 20.5 % — ABNORMAL HIGH (ref 11.5–15.5)
WBC: 5.9 10*3/uL (ref 4.0–10.5)
nRBC: 0 % (ref 0.0–0.2)

## 2023-05-16 NOTE — Progress Notes (Signed)
 PROGRESS NOTE    Carmen Armstrong  WUJ:[id number] DOB: April 14, 1965 DOA: 05/15/2023 PCP: Lewis Moccasin, MD   Brief Narrative:  Carmen Armstrong is a 58 y.o. female with medical history significant of poorly controlled T2DM with polyneuropathy, atrial fibrillation on eliquis, HTN, chronic diastolic HF, CAD, HLD, PAD, hypothyroidism, depression, anxiety presenting to the ED with worsening right great toe and left second toe wound with worsening and uncontrolled pain with notable swelling and drainage.  She was prescribed antibiotics in the outpatient setting previously but has not been compliant with these.  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 -Patient does not meet sepsis criteria -Podiatry consulted, appreciate insight recommendations -Continue ceftriaxone, Flagyl, vancomycin -continue to follow cultures -MRI unable to be performed due to patient's spinal stimulator -Bilateral ABI pending  Normocytic, iron deficiency, anemia -Questionably acute on chronic although unknown baseline, recent labs with hemoglobin around 12 -Possibly dilutional, FOBT negative, no signs or symptoms of bleeding -Continue to follow closely given patient continues on home Eliquis -Iron studies confirm low iron, discussed appropriate diet supplementation   Uncontrolled T2DM with polyneuropathy -A1c 13.1 just last month -Discussed diabetic diet, medication compliance -Continue sliding scale insulin, 25 units at bedtime glargine -Home regimen includes 64 units Tresiba, 3 times daily sliding scale, dapagliflozin/metformin/tirzepatide -Continue gabapentin/fluoxetine   Hypotension History of essential HTN -Hold home metoprolol, spironolactone, Imdur, candesartan, amlodipine  Parosymal Atrial fibrillation -Continue Eliquis -Holding metoprolol given  above  Chronic diastolic HF -Echo last year with EF 65%, grade 1 diastolic dysfunction -Holding core measures given above -Follow I's and O's, daily weights -Not on diuretics at baseline   CAD PVD HLD -Continue home Eliquis, atorvastatin -Lower extremity ABIs pending  Hyperthyroidism -Continue methimazole   Depression and anxiety -Continue fluoxetine, bupropion   Chronic pain Continue MS Contin 15 mg twice daily per PDMP  DVT prophylaxis: apixaban (ELIQUIS) tablet 5 mg  Code Status:   Code Status: Full Code Family Communication: None present  Status is: Inpatient  Dispo: The patient is from: Home              Anticipated d/c is to: To be determined              Anticipated d/c date is: 72+ hours              Patient currently not medically stable for discharge  Consultants:  Orthopedic surgery  Procedures:  Pending above  Antimicrobials:  Vancomycin, Flagyl, ceftriaxone  Subjective: No acute issues or events overnight, pain currently well-controlled denies nausea vomiting diarrhea constipation headache fevers chills or chest pain  Objective: Vitals:   05/15/23 1714 05/15/23 2007 05/15/23 2346 05/16/23 0500  BP: (!) 118/47 108/60 (!) 111/53   Pulse: 67 65 65   Resp: 18 16 16    Temp: 98.2 F (36.8 C) 98.4 F (36.9 C) 98.5 F (36.9 C)   TempSrc: Oral     SpO2: 95% 99% 100%   Weight:    102.3 kg  Height:        Intake/Output Summary (Last 24 hours) at 05/16/2023 0718 Last data filed at 05/16/2023 0344 Gross per 24 hour  Intake 911.11 ml  Output 1100 ml  Net -188.89 ml   Filed Weights   05/15/23 1312 05/16/23 0500  Weight: 101.6 kg 102.3 kg    Examination:  General:  Pleasantly resting in bed, No acute distress. HEENT:  Normocephalic atraumatic.  Sclerae nonicteric, noninjected.  Extraocular movements intact bilaterally. Neck:  Without mass or deformity.  Trachea is midline. Lungs:  Clear to auscultate bilaterally without rhonchi, wheeze, or  rales. Heart:  Regular rate and rhythm.  Without murmurs, rubs, or gallops. Abdomen:  Soft, nontender, nondistended.  Without guarding or rebound. Extremities: Bilateral lower extremities without pitting edema, bandages clean dry intact  Data Reviewed: I have personally reviewed following labs and imaging studies  CBC: Recent Labs  Lab 05/15/23 1316 05/15/23 2305 05/16/23 0508  WBC 7.1  --  5.9  NEUTROABS 4.0  --   --   HGB 9.8* 10.2* 9.0*  HCT 33.6* 34.6* 30.1*  MCV 87.7  --  86.7  PLT 278  --  236   Basic Metabolic Panel: Recent Labs  Lab 05/15/23 1316 05/16/23 0508  NA 138 138  K 4.2 4.1  CL 105 110  CO2 21* 20*  GLUCOSE 75 71  BUN 10 9  CREATININE 0.93 0.81  CALCIUM 8.7* 8.3*   GFR: Estimated Creatinine Clearance: 96.3 mL/min (by C-G formula based on SCr of 0.81 mg/dL). Liver Function Tests: Recent Labs  Lab 05/15/23 1316  AST 21  ALT 16  ALKPHOS 59  BILITOT 0.5  PROT 6.9  ALBUMIN 3.0*   CBG: Recent Labs  Lab 05/15/23 2129 05/15/23 2201 05/15/23 2243 05/16/23 0209 05/16/23 0615  GLUCAP 73 69* 85 111* 107*   Anemia Panel: Recent Labs    05/15/23 1738  FERRITIN 35  TIBC 228*  IRON 22*   Sepsis Labs: Recent Labs  Lab 05/15/23 1330 05/15/23 1546  LATICACIDVEN 0.9 1.2    Recent Results (from the past 240 hours)  Surgical PCR screen     Status: None   Collection Time: 05/15/23  5:30 PM   Specimen: Nasal Mucosa; Nasal Swab  Result Value Ref Range Status   MRSA, PCR NEGATIVE NEGATIVE Final   Staphylococcus aureus NEGATIVE NEGATIVE Final    Comment: (NOTE) The Xpert SA Assay (FDA approved for NASAL specimens in patients 27 years of age and older), is one component of a comprehensive surveillance program. It is not intended to diagnose infection nor to guide or monitor treatment. Performed at [facility], [address] [place], [place] [address]          Radiology Studies: No results found.      Scheduled Meds:   acetaminophen  1,000 mg Oral Q8H   Or   acetaminophen  650 mg Rectal Q8H   apixaban  5 mg Oral BID   atorvastatin  40 mg Oral QHS   buPROPion  300 mg Oral q morning   FLUoxetine  20 mg Oral Daily   gabapentin  600 mg Oral QHS   insulin aspart  0-15 Units Subcutaneous TID WC   insulin glargine-yfgn  25 Units Subcutaneous Q2200   methimazole  5 mg Oral Once per day on Monday Tuesday Wednesday Thursday Friday Saturday   morphine  15 mg Oral Q12H   mupirocin ointment  1 Application Nasal BID   pantoprazole  40 mg Oral Daily   Continuous Infusions:  cefTRIAXone (ROCEPHIN)  IV     metronidazole 500 mg (05/16/23 0206)   vancomycin       LOS: 1 day   Time spent:  Azucena Fallen, DO [facility]  If 7PM-7AM, please contact night-coverage [email or web address]  05/16/2023, 7:18 AM

## 2023-05-16 NOTE — Progress Notes (Signed)
 Pt has been very tearful on and off since this morning.  MD aware.

## 2023-05-16 NOTE — Consult Note (Addendum)
 PODIATRY CONSULTATION  NAME Carmen Armstrong MRN [id number] DOB Nov 24, 1965 DOA 05/15/2023   Reason for consult: bilateral toe ulceration  Attending/Consulting physician: Alease Frame MD  History of present illness: "Carmen Armstrong is a 58 y.o. female with medical history significant of poorly controlled T2DM with polyneuropathy, atrial fibrillation on eliquis, HTN, chronic diastolic HF, CAD, HLD, PAD, hypothyroidism, depression, anxiety presenting to the ED with a diabetic toe wound.   Patient evaluated by podiatry earlier today, found to have ulcers on right big toe and left second toe, both with surrounding swelling and drainage. Apparently noted by PCP earlier and was advised to f/u with podiatry. She was prescribed antibiotics by her PCP but she did not start taking them. Per podiatry note, unclear timing as to when these first developed. Also unable to elicit additional information from patient to help delineate timeline.    Patient does note that she has not been feeling well. Denies any fevers, chills, chest pain, palpitations, SHOB, abdominal pain. Does not pain in bilateral feet/lower legs. Has been having burning pain in both feet as well. "   Pt states these wounds happened recently and previously wasn't having any trouble with her feet. She is tearful at news of infection in bone and need for amputation but understands why it is recommended.   Past Medical History:  Diagnosis Date   Arrhythmia    Arthritis    Atrial fibrillation (HCC)    CAD (coronary artery disease)    Chronic lower back pain    Depression    Diabetes mellitus without complication (HCC)    Eczema    GAD (generalized anxiety disorder)    GERD (gastroesophageal reflux disease)    H/O vitamin D deficiency    Heart failure (HCC)    Hyperlipidemia    Hypertension    Hypothyroidism    Insomnia    Peripheral neuropathy    PVD (peripheral vascular disease) (HCC)        Latest Ref Rng & Units  05/16/2023    5:08 AM 05/15/2023   11:05 PM 05/15/2023    1:16 PM  CBC  WBC 4.0 - 10.5 K/uL 5.9   7.1   Hemoglobin 12.0 - 15.0 g/dL 9.0  40.9  9.8   Hematocrit 36.0 - 46.0 % 30.1  34.6  33.6   Platelets 150 - 400 K/uL 236   278        Latest Ref Rng & Units 05/16/2023    5:08 AM 05/15/2023    1:16 PM 04/21/2023    4:24 AM  BMP  Glucose 70 - 99 mg/dL 71  75  811   BUN 6 - 20 mg/dL 9  10  20    Creatinine 0.44 - 1.00 mg/dL 9.14  7.82  9.56   Sodium 135 - 145 mmol/L 138  138  132   Potassium 3.5 - 5.1 mmol/L 4.1  4.2  3.9   Chloride 98 - 111 mmol/L 110  105  100   CO2 22 - 32 mmol/L 20  21  23    Calcium 8.9 - 10.3 mg/dL 8.3  8.7  8.6       Physical Exam: Lower Extremity Exam Vasc: R - PT non palpable, DP 1/4 palpable. Cap refill diminished  L - PT non palpable, DP 1/4 palpable. Cap refill diminished Derm: R - ulceration distal aspect R hallux with edema of right hallux   L - L 2nd toe with ulceration distal tuft with edema of distal 2nd toe  MSK:  R - edema of foot  L - edema of foot, partial l hallux amp well healed  Neuro: R - Gross sensation diminished. Gross motor function intact   L - Gross sensation diminished. Gross motor function intact    ASSESSMENT/PLAN OF CARE 58 y.o. female with PMHx significant for  poorly controlled T2DM with polyneuropathy, atrial fibrillation on eliquis, HTN, chronic diastolic HF, CAD, HLD, PAD, hypothyroidism, depression, anxiety  with ulceration of the right hallux and left 2nd toe with underlying osteomyelitis of both toes.  CT Foot R: Osteomyelitis of the tuft of the distal phalanx great toe with overlying gas in the soft tissues compatible with infection/ulceration.  CT Foot L: Abnormal sclerosis in the distal phalanx of the second toe compatible with osteomyelitis.  ABI: scheduled to be done later   - Pt will require Right hallux and left 2nd toe amputation this admission. Timing likely Friday.  - Check ABI studies this afternoon, if  abnormal recommend vascular consult today - Continue IV abx broad spectrum pending further culture data - Anticoagulation: hold pending OR - Wound care:  Betadine paint to affected toes pre op - WB status: WBAT BLE - Will continue to follow   Thank you for the consult.  Please contact me directly with any questions or concerns.           Corinna Gab, DPM [facility] / [facility]    [address] [place], [place] [address]                Office [phone]  Fax [phone]

## 2023-05-16 NOTE — Plan of Care (Signed)
  Problem: Education: Goal: Ability to describe self-care measures that may prevent or decrease complications (Diabetes Survival Skills Education) will improve Outcome: Progressing   Problem: Coping: Goal: Ability to adjust to condition or change in health will improve Outcome: Progressing   Problem: Health Behavior/Discharge Planning: Goal: Ability to manage health-related needs will improve Outcome: Progressing   Problem: Metabolic: Goal: Ability to maintain appropriate glucose levels will improve Outcome: Progressing   Problem: Skin Integrity: Goal: Risk for impaired skin integrity will decrease Outcome: Progressing   Problem: Tissue Perfusion: Goal: Adequacy of tissue perfusion will improve Outcome: Progressing   Problem: Clinical Measurements: Goal: Diagnostic test results will improve Outcome: Progressing Goal: Respiratory complications will improve Outcome: Progressing Goal: Cardiovascular complication will be avoided Outcome: Progressing   Problem: Activity: Goal: Risk for activity intolerance will decrease Outcome: Progressing   Problem: Nutrition: Goal: Adequate nutrition will be maintained Outcome: Progressing   Problem: Elimination: Goal: Will not experience complications related to bowel motility Outcome: Progressing Goal: Will not experience complications related to urinary retention Outcome: Progressing   Problem: Pain Managment: Goal: General experience of comfort will improve and/or be controlled Outcome: Progressing   Problem: Safety: Goal: Ability to remain free from injury will improve Outcome: Progressing   Problem: Skin Integrity: Goal: Risk for impaired skin integrity will decrease Outcome: Progressing   Problem: Activity: Goal: Capacity to carry out activities will improve Outcome: Progressing   Problem: Education: Goal: Knowledge of disease or condition will improve Outcome: Progressing   Problem: Cardiac: Goal: Ability to  achieve and maintain adequate cardiopulmonary perfusion will improve Outcome: Progressing

## 2023-05-16 NOTE — Plan of Care (Signed)
  Problem: Fluid Volume: Goal: Ability to maintain a balanced intake and output will improve Outcome: Progressing   Problem: Health Behavior/Discharge Planning: Goal: Ability to manage health-related needs will improve Outcome: Progressing   Problem: Metabolic: Goal: Ability to maintain appropriate glucose levels will improve Outcome: Progressing   Problem: Nutritional: Goal: Maintenance of adequate nutrition will improve Outcome: Progressing   Problem: Skin Integrity: Goal: Risk for impaired skin integrity will decrease Outcome: Progressing   Problem: Tissue Perfusion: Goal: Adequacy of tissue perfusion will improve Outcome: Progressing   Problem: Clinical Measurements: Goal: Ability to maintain clinical measurements within normal limits will improve Outcome: Progressing Goal: Diagnostic test results will improve Outcome: Progressing Goal: Respiratory complications will improve Outcome: Progressing Goal: Cardiovascular complication will be avoided Outcome: Progressing   Problem: Activity: Goal: Risk for activity intolerance will decrease Outcome: Progressing   Problem: Nutrition: Goal: Adequate nutrition will be maintained Outcome: Progressing   Problem: Elimination: Goal: Will not experience complications related to bowel motility Outcome: Progressing Goal: Will not experience complications related to urinary retention Outcome: Progressing   Problem: Pain Managment: Goal: General experience of comfort will improve and/or be controlled Outcome: Progressing   Problem: Safety: Goal: Ability to remain free from injury will improve Outcome: Progressing   Problem: Skin Integrity: Goal: Risk for impaired skin integrity will decrease Outcome: Progressing   Problem: Activity: Goal: Ability to tolerate increased activity will improve Outcome: Progressing   Problem: Cardiac: Goal: Ability to achieve and maintain adequate cardiopulmonary perfusion will  improve Outcome: Progressing   Problem: Health Behavior/Discharge Planning: Goal: Ability to safely manage health-related needs after discharge will improve Outcome: Progressing

## 2023-05-16 NOTE — TOC Initial Note (Addendum)
 Transition of Care [facility]) - Initial/Assessment Note    Patient Details  Name: Carmen Armstrong MRN: [id number] Date of Birth: November 11, 1965  [facility]) CM/SW Contact:    Epifanio Lesches, RN Phone Number: 05/16/2023, 2:19 PM  Clinical Narrative:                 Presents with diabetic toe wound. Hx of DM / polyneuropathy, atrial fib /eliquis, HTN,  CHF, CAD, HLD, PAD, hypothyroidism, depression, anxiety. From home alone. Supportive family. States PTA independent with ADL's. Has RW, and BSC @ home. Pt without RX med concerns or transportation issues.  Per podiatry:  Pt will require Right hallux and left 2nd toe amputation this admission. Timing likely Friday.   05/17/2023 -  4/1 CT bilateral lower extremity confirms osteomyelitis -Bilateral US-ABI pending   4/8 s/p Amputation of great toe, RIGHT foot and  Amputation of second toe , LEFT foot  TOC team following and will assist with TOC needs..  Expected Discharge Plan: Home/Self Care Barriers to Discharge: Continued Medical Work up   Patient Goals and CMS Choice            Expected Discharge Plan and Services   Discharge Planning Services: CM Consult   Living arrangements for the past 2 months: Single Family Home                                      Prior Living Arrangements/Services Living arrangements for the past 2 months: Single Family Home Lives with:: Self Patient language and need for interpreter reviewed:: Yes Do you feel safe going back to the place where you live?: Yes      Need for Family Participation in Patient Care: Yes (Comment) Care giver support system in place?: Yes (comment)   Criminal Activity/Legal Involvement Pertinent to Current Situation/Hospitalization: No - Comment as needed  Activities of Daily Living   ADL Screening (condition at time of admission) Independently performs ADLs?: Yes (appropriate for developmental age) (ambulates with cane) Is the patient deaf  or have difficulty hearing?: No Does the patient have difficulty seeing, even when wearing glasses/contacts?: No Does the patient have difficulty concentrating, remembering, or making decisions?: No  Permission Sought/Granted                  Emotional Assessment       Orientation: : Oriented to Self, Oriented to Place, Oriented to  Time, Oriented to Situation   Psych Involvement: No (comment)  Admission diagnosis:  Cellulitis of lower extremity, unspecified laterality [L03.119] Osteomyelitis of fifth toe of left foot (HCC) [M86.9] Patient Active Problem List   Diagnosis Date Noted   Osteomyelitis of second toe of left foot (HCC) 05/16/2023   Osteomyelitis of great toe of right foot (HCC) 05/16/2023   Osteomyelitis of fifth toe of left foot (HCC) 05/15/2023   Acute respiratory failure with hypoxia (HCC) 04/20/2023   Essential hypertension 04/20/2023   GERD (gastroesophageal reflux disease) 04/20/2023   Hyperlipidemia 04/20/2023   Paroxysmal atrial fibrillation (HCC) 04/20/2023   Depression 04/20/2023   Influenza A 04/19/2023   Type 2 diabetes mellitus with diabetic polyneuropathy, with long-term current use of insulin (HCC) 12/27/2022   Graves disease 12/27/2022   Type 2 diabetes mellitus with hyperglycemia, with long-term current use of insulin (HCC) 12/27/2022   Chronic pain 05/08/2022   Precordial pain    Pain due to onychomycosis of toenails of  both feet 08/09/2020   Coagulation defect (HCC) 08/09/2020   Diabetic neuropathy (HCC) 08/09/2020   PCP:  Lewis Moccasin, MD Pharmacy:   [facility] [address], [place] - [address] [place] [address] Phone: [phone] Fax: [phone]  [facility] - [address] [place] - [address] [place] [address] Phone: [phone] Fax: [phone]     Social Drivers of Health (SDOH) Social History: SDOH Screenings   Food  Insecurity: No Food Insecurity (05/15/2023)  Housing: Low Risk  (05/15/2023)  Transportation Needs: No Transportation Needs (05/15/2023)  Utilities: Not At Risk (05/15/2023)  Social Connections: Unknown (11/30/2021)   [facility] from [facility]  Tobacco Use: High Risk (05/15/2023)   SDOH Interventions:     Readmission Risk Interventions     No data to display

## 2023-05-17 ENCOUNTER — Encounter (HOSPITAL_COMMUNITY): Payer: Self-pay

## 2023-05-17 ENCOUNTER — Inpatient Hospital Stay (HOSPITAL_COMMUNITY)

## 2023-05-17 DIAGNOSIS — I739 Peripheral vascular disease, unspecified: Secondary | ICD-10-CM

## 2023-05-17 DIAGNOSIS — M869 Osteomyelitis, unspecified: Secondary | ICD-10-CM | POA: Diagnosis not present

## 2023-05-17 LAB — BASIC METABOLIC PANEL WITH GFR
Anion gap: 11 (ref 5–15)
BUN: 8 mg/dL (ref 6–20)
CO2: 24 mmol/L (ref 22–32)
Calcium: 8.7 mg/dL — ABNORMAL LOW (ref 8.9–10.3)
Chloride: 106 mmol/L (ref 98–111)
Creatinine, Ser: 0.75 mg/dL (ref 0.44–1.00)
GFR, Estimated: >60 mL/min (ref >60)
Glucose, Bld: 80 mg/dL (ref 70–99)
Potassium: 4.1 mmol/L (ref 3.5–5.1)
Sodium: 141 mmol/L (ref 135–145)

## 2023-05-17 LAB — CBC
HCT: 35.5 % — ABNORMAL LOW (ref 36.0–46.0)
Hemoglobin: 10.4 g/dL — ABNORMAL LOW (ref 12.0–15.0)
MCH: 25.7 pg — ABNORMAL LOW (ref 26.0–34.0)
MCHC: 29.3 g/dL — ABNORMAL LOW (ref 30.0–36.0)
MCV: 87.9 fL (ref 80.0–100.0)
Platelets: 288 10*3/uL (ref 150–400)
RBC: 4.04 MIL/uL (ref 3.87–5.11)
RDW: 20.6 % — ABNORMAL HIGH (ref 11.5–15.5)
WBC: 6.7 10*3/uL (ref 4.0–10.5)
nRBC: 0 % (ref 0.0–0.2)

## 2023-05-17 LAB — GLUCOSE, CAPILLARY
Glucose-Capillary: 78 mg/dL (ref 70–99)
Glucose-Capillary: 93 mg/dL (ref 70–99)
Glucose-Capillary: 112 mg/dL — ABNORMAL HIGH (ref 70–99)
Glucose-Capillary: 76 mg/dL (ref 70–99)

## 2023-05-17 MED ORDER — ENOXAPARIN SODIUM 60 MG/0.6ML IJ SOSY
50.0000 mg | PREFILLED_SYRINGE | Freq: Every day | INTRAMUSCULAR | Status: DC
  Administered 2023-05-17 – 2023-05-22 (×4): 50 mg via SUBCUTANEOUS
  Filled 2023-05-17 (×5): qty 0.6

## 2023-05-17 MED ORDER — METRONIDAZOLE 500 MG PO TABS
500.0000 mg | ORAL_TABLET | Freq: Two times a day (BID) | ORAL | Status: DC
  Administered 2023-05-17 – 2023-05-22 (×10): 500 mg via ORAL
  Filled 2023-05-17 (×10): qty 1

## 2023-05-17 MED ORDER — INSULIN GLARGINE-YFGN 100 UNIT/ML ~~LOC~~ SOLN
12.0000 [IU] | Freq: Every day | SUBCUTANEOUS | Status: DC
  Administered 2023-05-18 – 2023-05-22 (×5): 12 [IU] via SUBCUTANEOUS
  Filled 2023-05-17 (×8): qty 0.12

## 2023-05-17 NOTE — Progress Notes (Signed)
 PODIATRY PROGRESS NOTE Patient Name: Carmen Armstrong  DOB 1965/10/29 DOA 05/15/2023  Hospital Day: 3  Assessment:  58 y.o. female with PMHx significant for  poorly controlled T2DM with polyneuropathy, atrial fibrillation on eliquis, HTN, chronic diastolic HF, CAD, HLD, PAD, hypothyroidism, depression, anxiety  with ulceration of the right hallux and left 2nd toe with underlying osteomyelitis of both toes.   CT Foot R: Osteomyelitis of the tuft of the distal phalanx great toe with overlying gas in the soft tissues compatible with infection/ulceration.   CT Foot L: Abnormal sclerosis in the distal phalanx of the second toe compatible with osteomyelitis.   ABI: scheduled to be done later   Plan:  - NPO past MN for OR tomorrow afternoon for Right halulx and left 2nd toe partial amputation pending ABI results. May be moved to Monday if abnormal ABI results requiring vascular intervention - Check ABI studies this afternoon, if abnormal recommend vascular consult   - Continue IV abx broad spectrum pending further culture data - Anticoagulation: hold pending OR - Wound care:  Betadine paint to affected toes pre op - WB status: WBAT BLE - Will continue to follow        Corinna Gab, DPM [facility]    Subjective:  Discussed with Pt plan for OR tomorrow pending ABI study results. She is aware of plans and aware OR may be cancelled if inadequate blood flow requiring vascular evaluation.   Objective:   Vitals:   05/17/23 0333 05/17/23 0736  BP: 131/65 (!) 147/62  Pulse: 65 65  Resp:  16  Temp: 98.3 F (36.8 C) 98.5 F (36.9 C)  SpO2: 96% 100%       Latest Ref Rng & Units 05/17/2023    5:57 AM 05/16/2023    5:08 AM 05/15/2023   11:05 PM  CBC  WBC 4.0 - 10.5 K/uL 6.7  5.9    Hemoglobin 12.0 - 15.0 g/dL 47.8  9.0  29.5   Hematocrit 36.0 - 46.0 % 35.5  30.1  34.6   Platelets 150 - 400 K/uL 288  236         Latest Ref Rng & Units 05/17/2023    5:57 AM  05/16/2023    5:08 AM 05/15/2023    1:16 PM  BMP  Glucose 70 - 99 mg/dL 80  71  75   BUN 6 - 20 mg/dL 8  9  10    Creatinine 0.44 - 1.00 mg/dL 6.21  3.08  6.57   Sodium 135 - 145 mmol/L 141  138  138   Potassium 3.5 - 5.1 mmol/L 4.1  4.1  4.2   Chloride 98 - 111 mmol/L 106  110  105   CO2 22 - 32 mmol/L 24  20  21    Calcium 8.9 - 10.3 mg/dL 8.7  8.3  8.7     General: AAOx3, NAD  Lower Extremity Exam Lower Extremity Exam Vasc:     R - PT non palpable, DP 1/4 palpable. Cap refill diminished               L - PT non palpable, DP 1/4 palpable. Cap refill diminished Derm:    R - ulceration distal aspect R hallux with edema of right hallux                L - L 2nd toe with ulceration distal tuft with edema of distal 2nd toe    MSK:  R - edema of foot               L - edema of foot, partial l hallux amp well healed   Neuro:R - Gross sensation diminished. Gross motor function intact                 L - Gross sensation diminished. Gross motor function intact     Radiology:  Results reviewed. See assessment for pertinent imaging results

## 2023-05-17 NOTE — Progress Notes (Signed)
 PROGRESS NOTE    Carmen Armstrong  ZOX:[id number] DOB: Apr 04, 1965 DOA: 05/15/2023 PCP: Lewis Moccasin, MD   Brief Narrative:  Carmen Armstrong is a 58 y.o. female with medical history significant of poorly controlled T2DM with polyneuropathy, atrial fibrillation on eliquis, HTN, chronic diastolic HF, CAD, HLD, PAD, hypothyroidism, depression, anxiety presenting to the ED with worsening right great toe and left second toe wound with worsening and uncontrolled pain with notable swelling and drainage.  She was prescribed antibiotics in the outpatient setting previously but has not been compliant with these.  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 -Patient does not meet sepsis criteria -Podiatry consulted, appreciate insight recommendations -plan for right hallux and left second toe amputation, 05/18/23  -Continue ceftriaxone, Flagyl, vancomycin -continue to follow cultures -MRI unable to be performed due to patient's spinal stimulator -CT bilateral lower extremity confirms osteomyelitis -Bilateral US-ABI pending  Normocytic, iron deficiency, anemia -Questionably acute on chronic although unknown baseline, recent labs with hemoglobin around 12 -Possibly dilutional, FOBT negative, no signs or symptoms of bleeding -Continue to follow closely given patient continues on home Eliquis -Iron studies confirm low iron, discussed appropriate diet supplementation   Uncontrolled T2DM with polyneuropathy -A1c 13.1 just last month -Discussed diabetic diet, medication compliance -Continue sliding scale insulin, 25 units at bedtime glargine -Home regimen includes 64 units Tresiba, 3 times daily sliding scale,  dapagliflozin/metformin/tirzepatide -Continue gabapentin/fluoxetine   Hypotension History of essential HTN -Hold home metoprolol, spironolactone, Imdur, candesartan, amlodipine  Parosymal Atrial fibrillation -Continue Eliquis -Holding metoprolol given above  Chronic diastolic HF -Echo last year with EF 65%, grade 1 diastolic dysfunction -Holding core measures given above -Follow I's and O's, daily weights -Not on diuretics at baseline   CAD PVD HLD -Continue home Eliquis, atorvastatin -Lower extremity ABIs pending  Hyperthyroidism -Continue methimazole   Depression and anxiety -Continue fluoxetine, bupropion   Chronic pain Continue MS Contin 15 mg twice daily per PDMP  DVT prophylaxis: apixaban (ELIQUIS) tablet 5 mg  Code Status:   Code Status: Full Code Family Communication: None present  Status is: Inpatient  Dispo: The patient is from: Home              Anticipated d/c is to: To be determined              Anticipated d/c date is: 72+ hours              Patient currently not medically stable for discharge  Consultants:  Orthopedic surgery  Procedures:  Pending above  Antimicrobials:  Vancomycin, Flagyl, ceftriaxone  Subjective: No acute issues or events overnight, pain currently well-controlled denies nausea vomiting diarrhea constipation headache fevers chills or chest pain  Objective: Vitals:   05/16/23 2100 05/16/23 2109 05/17/23 0317 05/17/23 0333  BP: (!) 126/54   131/65  Pulse: 64 70  65  Resp: 17     Temp: 98.1 F (36.7 C)   98.3 F (36.8 C)  TempSrc:    Oral  SpO2: (!) 88% 95%  96%  Weight:   101.9 kg   Height:        Intake/Output Summary (Last 24 hours) at 05/17/2023 8119 Last data filed at 05/17/2023 0313 Gross per  24 hour  Intake 1384.54 ml  Output --  Net 1384.54 ml   Filed Weights   05/15/23 1312 05/16/23 0500 05/17/23 0317  Weight: 101.6 kg 102.3 kg 101.9 kg    Examination:  General:  Pleasantly resting in bed, No acute  distress. HEENT:  Normocephalic atraumatic.  Sclerae nonicteric, noninjected.  Extraocular movements intact bilaterally. Neck:  Without mass or deformity.  Trachea is midline. Lungs:  Clear to auscultate bilaterally without rhonchi, wheeze, or rales. Heart:  Regular rate and rhythm.  Without murmurs, rubs, or gallops. Abdomen:  Soft, nontender, nondistended.  Without guarding or rebound. Extremities: Bilateral lower extremities without pitting edema, bandages clean dry intact  Data Reviewed: I have personally reviewed following labs and imaging studies  CBC: Recent Labs  Lab 05/15/23 1316 05/15/23 2305 05/16/23 0508  WBC 7.1  --  5.9  NEUTROABS 4.0  --   --   HGB 9.8* 10.2* 9.0*  HCT 33.6* 34.6* 30.1*  MCV 87.7  --  86.7  PLT 278  --  236   Basic Metabolic Panel: Recent Labs  Lab 05/15/23 1316 05/16/23 0508  NA 138 138  K 4.2 4.1  CL 105 110  CO2 21* 20*  GLUCOSE 75 71  BUN 10 9  CREATININE 0.93 0.81  CALCIUM 8.7* 8.3*   GFR: Estimated Creatinine Clearance: 96.2 mL/min (by C-G formula based on SCr of 0.81 mg/dL). Liver Function Tests: Recent Labs  Lab 05/15/23 1316  AST 21  ALT 16  ALKPHOS 59  BILITOT 0.5  PROT 6.9  ALBUMIN 3.0*   CBG: Recent Labs  Lab 05/16/23 0615 05/16/23 1115 05/16/23 1628 05/16/23 2119 05/17/23 0634  GLUCAP 107* 72 120* 91 78   Anemia Panel: Recent Labs    05/15/23 1738  FERRITIN 35  TIBC 228*  IRON 22*   Sepsis Labs: Recent Labs  Lab 05/15/23 1330 05/15/23 1546  LATICACIDVEN 0.9 1.2    Recent Results (from the past 240 hours)  Blood Cultures x 2 sites     Status: None (Preliminary result)   Collection Time: 05/15/23  3:35 PM   Specimen: BLOOD  Result Value Ref Range Status   Specimen Description BLOOD RIGHT ANTECUBITAL  Final   Special Requests   Final    BOTTLES DRAWN AEROBIC AND ANAEROBIC Blood Culture adequate volume   Culture   Final    NO GROWTH < 24 HOURS Performed at [facility], [address] [place], [place] [address]    Report Status PENDING  Incomplete  Blood Cultures x 2 sites     Status: None (Preliminary result)   Collection Time: 05/15/23  3:42 PM   Specimen: BLOOD LEFT HAND  Result Value Ref Range Status   Specimen Description BLOOD LEFT HAND  Final   Special Requests   Final    BOTTLES DRAWN AEROBIC ONLY Blood Culture adequate volume   Culture   Final    NO GROWTH < 24 HOURS Performed at [facility], [address] [place], [place] [address]    Report Status PENDING  Incomplete  Surgical PCR screen     Status: None   Collection Time: 05/15/23  5:30 PM   Specimen: Nasal Mucosa; Nasal Swab  Result Value Ref Range Status   MRSA, PCR NEGATIVE NEGATIVE Final   Staphylococcus aureus NEGATIVE NEGATIVE Final    Comment: (NOTE) The Xpert SA Assay (FDA approved for NASAL specimens in patients 64 years of age and older), is one component  of a comprehensive surveillance program. It is not intended to diagnose infection nor to guide or monitor treatment. Performed at [facility], [address], [place], [place] [address]          Radiology Studies: CT FOOT LEFT WO CONTRAST Result Date: 05/16/2023 CLINICAL DATA:  Osteomyelitis EXAM: CT OF THE LEFT FOOT WITHOUT CONTRAST TECHNIQUE: Multidetector CT imaging of the left foot was performed according to the standard protocol. Multiplanar CT image reconstructions were also generated. RADIATION DOSE REDUCTION: This exam was performed according to the departmental dose-optimization program which includes automated exposure control, adjustment of the mA and/or kV according to patient size and/or use of iterative reconstruction technique. COMPARISON:  Foot radiographs 05/15/2023 FINDINGS: Bones/Joint/Cartilage Prior amputation of the great toe at the interphalangeal joint and prior transmetatarsal fifth ray amputation. Abnormal sclerosis in the distal phalanx of the second toe compatible with osteomyelitis. 1.2 cm  non-fragmented osteochondral lesion of the anterior talar dome. Dorsal midfoot spurring. Spurring of the distal tibial rim. Mild Achilles calcaneal spurring. Ligaments Suboptimally assessed by CT. 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 Soft tissues Subcutaneous edema along the dorsum of the ankle. Mild cutaneous irregularity in subcutaneous edema in the second toe suspicious for cellulitis. IMPRESSION: 1. Abnormal sclerosis in the distal phalanx of the second toe compatible with osteomyelitis. 2. Mild cutaneous irregularity and subcutaneous edema in the second toe suspicious for cellulitis. 3. 1.2 cm non-fragmented osteochondral lesion of the anterior talar dome. 4. 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 5. Prior amputation of the great toe at the interphalangeal joint and prior transmetatarsal fifth ray amputation. 6. Subcutaneous edema along the dorsum of the ankle. Electronically Signed   By: Gaylyn Rong M.D.   On: 05/16/2023 10:25   CT FOOT RIGHT WO CONTRAST Result Date: 05/16/2023 CLINICAL DATA:  Osteomyelitis EXAM: CT OF THE RIGHT FOOT WITHOUT CONTRAST TECHNIQUE: Multidetector CT imaging of the right foot was performed according to the standard protocol. Multiplanar CT image reconstructions were also generated. RADIATION DOSE REDUCTION: This exam was performed according to the departmental dose-optimization program which includes automated exposure control, adjustment of the mA and/or kV according to patient size and/or use of iterative reconstruction technique. COMPARISON:  Radiographs 05/15/2023 FINDINGS: Bones/Joint/Cartilage Dorsum medial erosion of the tuft of the distal phalanx great toe with overlying gas in the soft tissues, with active osteomyelitis. Dorsal midfoot spurring. Dorsal spurring at the talonavicular articulation. Small plantar and Achilles calcaneal spurs.  Mild spurring along the distal tibial rim. Ligaments Suboptimally assessed by CT. Muscles and Tendons Moderate regional muscular atrophy. This is more striking in the abductor digiti minimi muscle favoring Baxter's neuropathy. Soft tissues Mild dorsal subcutaneous edema along the ankle and forefoot. Thinning of the soft tissues along the distal great toe with a loculation of gas in the soft tissues in this vicinity compatible with infection/ulceration. IMPRESSION: 1. Osteomyelitis of the tuft of the distal phalanx great toe with overlying gas in the soft tissues compatible with infection/ulceration. 2. Moderate regional muscular atrophy. This is more striking in the abductor digiti minimi muscle favoring Baxter's neuropathy. 3. Mild dorsal subcutaneous edema along the ankle and forefoot. 4. Small plantar and Achilles calcaneal spurs. 5. Dorsal midfoot spurring. Electronically Signed   By: Gaylyn Rong M.D.   On: 05/16/2023 10:19        Scheduled Meds:  acetaminophen  1,000 mg Oral Q8H   Or   acetaminophen  650 mg Rectal Q8H   apixaban  5 mg Oral BID   atorvastatin  40 mg Oral QHS   buPROPion  300 mg Oral q morning   FLUoxetine  20 mg Oral Daily   gabapentin  600 mg Oral QHS   insulin aspart  0-15 Units Subcutaneous TID WC   insulin glargine-yfgn  25 Units Subcutaneous Q2200   methimazole  5 mg Oral Once per day on Monday Tuesday Wednesday Thursday Friday Saturday   morphine  15 mg Oral Q12H   mupirocin ointment  1 Application Nasal BID   pantoprazole  40 mg Oral Daily   Continuous Infusions:  cefTRIAXone (ROCEPHIN)  IV 2 g (05/16/23 1432)   metronidazole 500 mg (05/17/23 0313)   vancomycin 1,500 mg (05/16/23 1757)     LOS: 2 days   Time spent:  Azucena Fallen, DO [facility]  If 7PM-7AM, please contact night-coverage [email or web address]  05/17/2023, 7:02 AM

## 2023-05-17 NOTE — Plan of Care (Signed)
  Problem: Education: Goal: Ability to describe self-care measures that may prevent or decrease complications (Diabetes Survival Skills Education) will improve Outcome: Progressing   Problem: Skin Integrity: Goal: Risk for impaired skin integrity will decrease Outcome: Progressing   Problem: Education: Goal: Knowledge of General Education information will improve Description: Including pain rating scale, medication(s)/side effects and non-pharmacologic comfort measures Outcome: Progressing   Problem: Activity: Goal: Risk for activity intolerance will decrease Outcome: Progressing   Problem: Pain Managment: Goal: General experience of comfort will improve and/or be controlled Outcome: Progressing   Problem: Safety: Goal: Ability to remain free from injury will improve Outcome: Progressing   Problem: Skin Integrity: Goal: Risk for impaired skin integrity will decrease Outcome: Progressing

## 2023-05-17 NOTE — Progress Notes (Signed)
 Ankle-brachial index completed. Please see CV Procedures for preliminary results.  Shona Simpson, RVT 05/17/23 3:46 PM

## 2023-05-17 NOTE — Progress Notes (Signed)
 PHARMACIST - PHYSICIAN COMMUNICATION DR:   Natale Milch CONCERNING: Antibiotic IV to Oral Route Change Policy  RECOMMENDATION: This patient is receiving metronidazole by the intravenous route.  Based on criteria approved by the Pharmacy and Therapeutics Committee, the antibiotic(s) is/are being converted to the equivalent oral dose form(s).   DESCRIPTION: These criteria include: Patient being treated for a respiratory tract infection, urinary tract infection, cellulitis  The patient is not neutropenic and does not exhibit a GI malabsorption state The patient is eating (either orally or via tube) and/or has been taking other orally administered medications for a least 24 hours The patient is improving clinically and has a Tmax < 100.5  If you have questions about this conversion, please contact the Pharmacy Department  []   [phone] )  [facility] []   [phone] )  [facility] [x]   [phone] )  [facility] []   [phone] )  [facility] []   [phone] )  Ilene Qua  Celedonio Miyamoto, PharmD, [place] Clinical Pharmacist Phone [phone]

## 2023-05-18 ENCOUNTER — Encounter (HOSPITAL_COMMUNITY)
Admission: EM | Disposition: A | Discharge status: Home / Self Care | Payer: Self-pay | Source: Ambulatory Visit | Attending: Internal Medicine

## 2023-05-18 DIAGNOSIS — I70235 Atherosclerosis of native arteries of right leg with ulceration of other part of foot: Secondary | ICD-10-CM

## 2023-05-18 DIAGNOSIS — I70245 Atherosclerosis of native arteries of left leg with ulceration of other part of foot: Secondary | ICD-10-CM

## 2023-05-18 DIAGNOSIS — M869 Osteomyelitis, unspecified: Secondary | ICD-10-CM | POA: Diagnosis not present

## 2023-05-18 LAB — GLUCOSE, CAPILLARY
Glucose-Capillary: 68 mg/dL — ABNORMAL LOW (ref 70–99)
Glucose-Capillary: 70 mg/dL (ref 70–99)
Glucose-Capillary: 138 mg/dL — ABNORMAL HIGH (ref 70–99)
Glucose-Capillary: 107 mg/dL — ABNORMAL HIGH (ref 70–99)

## 2023-05-18 LAB — VAS US ABI WITH/WO TBI
Left ABI: 0.92
Right ABI: 0.86

## 2023-05-18 SURGERY — AMPUTATION, TOE
Anesthesia: Choice | Site: Toe | Laterality: Bilateral

## 2023-05-18 MED ORDER — NICOTINE 14 MG/24HR TD PT24
14.0000 mg | MEDICATED_PATCH | Freq: Every day | TRANSDERMAL | Status: DC
  Administered 2023-05-18 – 2023-05-24 (×6): 14 mg via TRANSDERMAL
  Filled 2023-05-18 (×6): qty 1

## 2023-05-18 MED ORDER — DEXTROSE 50 % IV SOLN
12.5000 g | INTRAVENOUS | Status: AC
Start: 2023-05-18 — End: 2023-05-18
  Administered 2023-05-18: 12.5 g via INTRAVENOUS
  Filled 2023-05-18: qty 50

## 2023-05-18 NOTE — Progress Notes (Signed)
 PODIATRY PROGRESS NOTE Patient Name: Carmen Armstrong  DOB 04/01/1965 DOA 05/15/2023  Hospital Day: 4  Assessment:  58 y.o. female with PMHx significant for  poorly controlled T2DM with polyneuropathy, atrial fibrillation on eliquis, HTN, chronic diastolic HF, CAD, HLD, PAD, hypothyroidism, depression, anxiety  with ulceration of the right hallux and left 2nd toe with underlying osteomyelitis of both toes.   CT Foot R: Osteomyelitis of the tuft of the distal phalanx great toe with overlying gas in the soft tissues compatible with infection/ulceration.   CT Foot L: Abnormal sclerosis in the distal phalanx of the second toe compatible with osteomyelitis.   ABI: ABI 05/17/2023 +-------+-----------+-----------+------------+------------+  ABI/TBIToday's ABIToday's TBIPrevious ABIPrevious TBI  +-------+-----------+-----------+------------+------------+  Right 0.86       0          1.01        0.67          +-------+-----------+-----------+------------+------------+  Left  0.92       0.87       1.02        0.97          +-------+-----------+-----------+------------+------------+   Plan:  - Appreciate vascular surgery, plan for angiogram on Monday.  -Right hallux and left second toe amputation on hold until following vascular procedure earliest would be Tuesday morning. - Continue IV abx broad spectrum pending further culture data - Anticoagulation: Per primary/vascular - Wound care:  Betadine paint to affected toes pre op - WB status: WBAT BLE - Will continue to follow        Corinna Gab, DPM [facility]    Subjective:  Discussed with Pt plan to delay amputation of toes until after vascular is completed their angiogram to improve blood flow in the right lower extremity.  She is aware of surgery at the earliest for my standpoint on Tuesday or Wednesday  Objective:   Vitals:   05/18/23 0326 05/18/23 1019  BP: (!) 152/75 (!) 166/74  Pulse: 78  72  Resp: 18 16  Temp: 98.8 F (37.1 C) 98.6 F (37 C)  SpO2: 91% 100%       Latest Ref Rng & Units 05/17/2023    5:57 AM 05/16/2023    5:08 AM 05/15/2023   11:05 PM  CBC  WBC 4.0 - 10.5 K/uL 6.7  5.9    Hemoglobin 12.0 - 15.0 g/dL 16.1  9.0  09.6   Hematocrit 36.0 - 46.0 % 35.5  30.1  34.6   Platelets 150 - 400 K/uL 288  236         Latest Ref Rng & Units 05/17/2023    5:57 AM 05/16/2023    5:08 AM 05/15/2023    1:16 PM  BMP  Glucose 70 - 99 mg/dL 80  71  75   BUN 6 - 20 mg/dL 8  9  10    Creatinine 0.44 - 1.00 mg/dL 0.45  4.09  8.11   Sodium 135 - 145 mmol/L 141  138  138   Potassium 3.5 - 5.1 mmol/L 4.1  4.1  4.2   Chloride 98 - 111 mmol/L 106  110  105   CO2 22 - 32 mmol/L 24  20  21    Calcium 8.9 - 10.3 mg/dL 8.7  8.3  8.7     General: AAOx3, NAD  Lower Extremity Exam Lower Extremity Exam Vasc:     R - PT non palpable, DP 1/4 palpable. Cap refill diminished  L - PT non palpable, DP 1/4 palpable. Cap refill diminished Derm:    R - ulceration distal aspect R hallux with edema of right hallux                L - L 2nd toe with ulceration distal tuft with edema of distal 2nd toe    MSK:     R - edema of foot               L - edema of foot, partial l hallux amp well healed   Neuro:R - Gross sensation diminished. Gross motor function intact                 L - Gross sensation diminished. Gross motor function intact     Radiology:  Results reviewed. See assessment for pertinent imaging results

## 2023-05-18 NOTE — Plan of Care (Signed)
  Problem: Education: Goal: Ability to describe self-care measures that may prevent or decrease complications (Diabetes Survival Skills Education) will improve Outcome: Progressing Goal: Individualized Educational Video(s) Outcome: Progressing   Problem: Coping: Goal: Ability to adjust to condition or change in health will improve Outcome: Progressing   Problem: Fluid Volume: Goal: Ability to maintain a balanced intake and output will improve Outcome: Progressing   Problem: Health Behavior/Discharge Planning: Goal: Ability to identify and utilize available resources and services will improve Outcome: Progressing Goal: Ability to manage health-related needs will improve Outcome: Progressing   Problem: Metabolic: Goal: Ability to maintain appropriate glucose levels will improve Outcome: Progressing   Problem: Nutritional: Goal: Maintenance of adequate nutrition will improve Outcome: Progressing Goal: Progress toward achieving an optimal weight will improve Outcome: Progressing   Problem: Skin Integrity: Goal: Risk for impaired skin integrity will decrease Outcome: Progressing   Problem: Tissue Perfusion: Goal: Adequacy of tissue perfusion will improve Outcome: Progressing   Problem: Education: Goal: Knowledge of General Education information will improve Description: Including pain rating scale, medication(s)/side effects and non-pharmacologic comfort measures Outcome: Progressing   Problem: Health Behavior/Discharge Planning: Goal: Ability to manage health-related needs will improve Outcome: Progressing   Problem: Clinical Measurements: Goal: Ability to maintain clinical measurements within normal limits will improve Outcome: Progressing Goal: Will remain free from infection Outcome: Progressing Goal: Diagnostic test results will improve Outcome: Progressing Goal: Respiratory complications will improve Outcome: Progressing Goal: Cardiovascular complication will  be avoided Outcome: Progressing   Problem: Activity: Goal: Risk for activity intolerance will decrease Outcome: Progressing   Problem: Nutrition: Goal: Adequate nutrition will be maintained Outcome: Progressing   Problem: Coping: Goal: Level of anxiety will decrease Outcome: Progressing   Problem: Elimination: Goal: Will not experience complications related to bowel motility Outcome: Progressing Goal: Will not experience complications related to urinary retention Outcome: Progressing   Problem: Pain Managment: Goal: General experience of comfort will improve and/or be controlled Outcome: Progressing   Problem: Safety: Goal: Ability to remain free from injury will improve Outcome: Progressing   Problem: Skin Integrity: Goal: Risk for impaired skin integrity will decrease Outcome: Progressing   Problem: Education: Goal: Ability to demonstrate management of disease process will improve Outcome: Progressing Goal: Ability to verbalize understanding of medication therapies will improve Outcome: Progressing Goal: Individualized Educational Video(s) Outcome: Progressing   Problem: Activity: Goal: Capacity to carry out activities will improve Outcome: Progressing   Problem: Cardiac: Goal: Ability to achieve and maintain adequate cardiopulmonary perfusion will improve Outcome: Progressing   Problem: Education: Goal: Knowledge of disease or condition will improve Outcome: Progressing Goal: Understanding of medication regimen will improve Outcome: Progressing Goal: Individualized Educational Video(s) Outcome: Progressing   Problem: Activity: Goal: Ability to tolerate increased activity will improve Outcome: Progressing   Problem: Cardiac: Goal: Ability to achieve and maintain adequate cardiopulmonary perfusion will improve Outcome: Progressing   Problem: Health Behavior/Discharge Planning: Goal: Ability to safely manage health-related needs after discharge will  improve Outcome: Progressing

## 2023-05-18 NOTE — Progress Notes (Addendum)
 PROGRESS NOTE    Carmen Armstrong  QIO:[id number] DOB: 1965/10/28 DOA: 05/15/2023 PCP: Lewis Moccasin, MD   Brief Narrative:  Carmen Armstrong is a 58 y.o. female with medical history significant of poorly controlled T2DM with polyneuropathy, atrial fibrillation on eliquis, HTN, chronic diastolic HF, CAD, HLD, PAD, hypothyroidism, depression, anxiety presenting to the ED with worsening right great toe and left second toe wound with worsening and uncontrolled pain with notable swelling and drainage.  She was prescribed antibiotics in the outpatient setting previously but has not been compliant with these.  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 -Patient does not meet sepsis criteria -Podiatry consulted, appreciate insight recommendations -plan for right hallux and left second toe amputation, 05/18/23  -Continue ceftriaxone, Flagyl, vancomycin -continue to follow cultures -MRI unable to be performed due to patient's spinal stimulator -CT bilateral lower extremity confirms osteomyelitis -Bilateral US-ABI shows RLE mild arterial disease; LLE WNL- Podiatry requesting formal vascular consult -appreciate insight and recommendations Patient has planned procedure with vascular surgery Monday, today's surgery will be held until after vascular surgery evaluation.  Normocytic, iron deficiency, anemia -Questionably acute on chronic although unknown baseline, recent labs with hemoglobin around 12 -Possibly dilutional, FOBT negative, no signs or symptoms of bleeding -Continue to follow closely given patient continues on home Eliquis -Iron studies confirm low iron, discussed appropriate diet supplementation   Uncontrolled T2DM with  polyneuropathy -A1c 13.1 just last month -Discussed diabetic diet, medication compliance -Continue sliding scale insulin, 25 units at bedtime glargine -Home regimen includes 64 units Tresiba, 3 times daily sliding scale, dapagliflozin/metformin/tirzepatide -Continue gabapentin/fluoxetine   Hypotension History of essential HTN -Hold home metoprolol, spironolactone, Imdur, candesartan, amlodipine  Parosymal Atrial fibrillation -Continue Eliquis -Holding metoprolol given above  Chronic diastolic HF -Echo last year with EF 65%, grade 1 diastolic dysfunction -Holding core measures given above -Follow I's and O's, daily weights -Not on diuretics at baseline   CAD PVD HLD -Continue home Eliquis, atorvastatin -Lower extremity ABIs pending  Hyperthyroidism -Continue methimazole   Depression and anxiety -Continue fluoxetine, bupropion   Chronic pain Continue MS Contin 15 mg twice daily per PDMP  DVT prophylaxis:  Code Status:   Code Status: Full Code Family Communication: None present  Status is: Inpatient  Dispo: The patient is from: Home              Anticipated d/c is to: To be determined              Anticipated d/c date is: 72+ hours              Patient currently not medically stable for discharge  Consultants:  Orthopedic surgery  Procedures:  Pending above  Antimicrobials:  Vancomycin, Flagyl, ceftriaxone  Subjective: No acute issues or events overnight, pain currently well-controlled denies nausea vomiting diarrhea constipation headache fevers chills or chest pain, patient continues to voice concern that despite "her best efforts she continues to have infections" -we discussed that she remains high risk for infection due to her other chronic comorbid conditions.  Objective: Vitals:   05/17/23 0736 05/17/23 1432 05/17/23 1942 05/18/23 0326  BP: (!) 147/62 (!) 156/69 (!) 153/62 (!) 152/75  Pulse: 65 80 80 78  Resp: 16 18 18 18   Temp: 98.5 F (36.9 C)  98.7 F (37.1 C) 98.4 F (36.9 C) 98.8 F (37.1 C)  TempSrc: Oral Oral    SpO2: 100% 98% 93% 91%  Weight:      Height:        Intake/Output Summary (Last 24 hours) at 05/18/2023 0550 Last data filed at 05/17/2023 2127 Gross per 24 hour  Intake 960 ml  Output --  Net 960 ml   Filed Weights   05/15/23 1312 05/16/23 0500 05/17/23 0317  Weight: 101.6 kg 102.3 kg 101.9 kg    Examination:  General:  Pleasantly resting in bed, No acute distress. HEENT:  Normocephalic atraumatic.  Sclerae nonicteric, noninjected.  Extraocular movements intact bilaterally. Neck:  Without mass or deformity.  Trachea is midline. Lungs:  Clear to auscultate bilaterally without rhonchi, wheeze, or rales. Heart:  Regular rate and rhythm.  Without murmurs, rubs, or gallops. Abdomen:  Soft, nontender, nondistended.  Without guarding or rebound. Extremities: Bilateral lower extremities without pitting edema, bandages clean dry intact  Data Reviewed: I have personally reviewed following labs and imaging studies  CBC: Recent Labs  Lab 05/15/23 1316 05/15/23 2305 05/16/23 0508 05/17/23 0557  WBC 7.1  --  5.9 6.7  NEUTROABS 4.0  --   --   --   HGB 9.8* 10.2* 9.0* 10.4*  HCT 33.6* 34.6* 30.1* 35.5*  MCV 87.7  --  86.7 87.9  PLT 278  --  236 288   Basic Metabolic Panel: Recent Labs  Lab 05/15/23 1316 05/16/23 0508 05/17/23 0557  NA 138 138 141  K 4.2 4.1 4.1  CL 105 110 106  CO2 21* 20* 24  GLUCOSE 75 71 80  BUN 10 9 8   CREATININE 0.93 0.81 0.75  CALCIUM 8.7* 8.3* 8.7*   GFR: Estimated Creatinine Clearance: 97.4 mL/min (by C-G formula based on SCr of 0.75 mg/dL). Liver Function Tests: Recent Labs  Lab 05/15/23 1316  AST 21  ALT 16  ALKPHOS 59  BILITOT 0.5  PROT 6.9  ALBUMIN 3.0*   CBG: Recent Labs  Lab 05/16/23 2119 05/17/23 0634 05/17/23 1128 05/17/23 1627 05/17/23 2138  GLUCAP 91 78 93 112* 76   Anemia Panel: Recent Labs    05/15/23 1738  FERRITIN 35  TIBC 228*  IRON  22*   Sepsis Labs: Recent Labs  Lab 05/15/23 1330 05/15/23 1546  LATICACIDVEN 0.9 1.2    Recent Results (from the past 240 hours)  Blood Cultures x 2 sites     Status: None (Preliminary result)   Collection Time: 05/15/23  3:35 PM   Specimen: BLOOD  Result Value Ref Range Status   Specimen Description BLOOD RIGHT ANTECUBITAL  Final   Special Requests   Final    BOTTLES DRAWN AEROBIC AND ANAEROBIC Blood Culture adequate volume   Culture   Final    NO GROWTH 2 DAYS Performed at [facility], [address] [place], [place] [address]    Report Status PENDING  Incomplete  Blood Cultures x 2 sites     Status: None (Preliminary result)   Collection Time: 05/15/23  3:42 PM   Specimen: BLOOD LEFT HAND  Result Value Ref Range Status   Specimen Description BLOOD LEFT HAND  Final   Special Requests   Final    BOTTLES DRAWN AEROBIC ONLY Blood Culture adequate volume   Culture   Final    NO GROWTH 2 DAYS Performed at [facility], [address] [place], [place] [address]    Report Status PENDING  Incomplete  Surgical PCR screen     Status: None   Collection Time: 05/15/23  5:30 PM   Specimen: Nasal Mucosa; Nasal Swab  Result Value Ref Range Status   MRSA, PCR NEGATIVE NEGATIVE Final   Staphylococcus aureus NEGATIVE NEGATIVE Final    Comment: (NOTE) The Xpert SA Assay (FDA approved for NASAL specimens in patients 59 years of age and older), is one component of a comprehensive surveillance program. It is not intended to diagnose infection nor to guide or monitor treatment. Performed at [facility], [address] [place], [place] [address]          Radiology Studies: VAS [place] ABI WITH/WO TBI Result Date: 05/17/2023  LOWER EXTREMITY DOPPLER STUDY Patient Name:  Carmen Armstrong  Date of Exam:   05/17/2023 Medical Rec #: [id number]             Accession #:    [id number] Date of Birth: 01-Sep-1965             Patient Gender: F Patient Age:   57 years Exam  Location:  [facility] Procedure:      VAS [place] ABI WITH/WO TBI Referring Phys: Merrilyn Puma --------------------------------------------------------------------------------  Indications: Peripheral artery disease. High Risk         Hypertension, hyperlipidemia, Diabetes, coronary artery Factors:          disease. Other Factors: PAD.  Comparison Study: Reduced waveforms, ABIs, and TBIs since previous exam                   02/25/21. Performing Technologist: Shona Simpson  Examination Guidelines: A complete evaluation includes at minimum, Doppler waveform signals and systolic blood pressure reading at the level of bilateral brachial, anterior tibial, and posterior tibial arteries, when vessel segments are accessible. Bilateral testing is considered an integral part of a complete examination. Photoelectric Plethysmograph (PPG) waveforms and toe systolic pressure readings are included as required and additional duplex testing as needed. Limited examinations for reoccurring indications may be performed as noted.  ABI Findings: +---------+------------------+-----+----------+----------+ Right    Rt Pressure (mmHg)IndexWaveform  Comment    +---------+------------------+-----+----------+----------+ Brachial 171                    triphasic            +---------+------------------+-----+----------+----------+ PTA      155               0.86 monophasic           +---------+------------------+-----+----------+----------+ DP       151               0.84 monophasic           +---------+------------------+-----+----------+----------+ Great Toe0                 0.00 Absent    Second toe +---------+------------------+-----+----------+----------+ +---------+------------------+-----+---------+----------+ Left     Lt Pressure (mmHg)IndexWaveform Comment    +---------+------------------+-----+---------+----------+ Brachial 180                    triphasic            +---------+------------------+-----+---------+----------+ PTA      165               0.92 triphasic           +---------+------------------+-----+---------+----------+ DP       159  0.88 biphasic            +---------+------------------+-----+---------+----------+ Great Toe156               0.87 Abnormal Second toe +---------+------------------+-----+---------+----------+ +-------+-----------+-----------+------------+------------+ ABI/TBIToday's ABIToday's TBIPrevious ABIPrevious TBI +-------+-----------+-----------+------------+------------+ Right  0.86       0          1.01        0.67         +-------+-----------+-----------+------------+------------+ Left   0.92       0.87       1.02        0.97         +-------+-----------+-----------+------------+------------+   Bilateral ABIs appear decreased compared to prior study on 02/25/21. Bilateral TBIs appear decreased compared to prior study on 02/25/21.  Summary: Right: Resting right ankle-brachial index indicates mild right lower extremity arterial disease. The right toe-brachial index is abnormal. Left: Resting left ankle-brachial index indicates mild left lower extremity arterial disease. The left toe-brachial index is normal. *See table(s) above for measurements and observations.     Preliminary         Scheduled Meds:  acetaminophen  1,000 mg Oral Q8H   Or   acetaminophen  650 mg Rectal Q8H   atorvastatin  40 mg Oral QHS   buPROPion  300 mg Oral q morning   enoxaparin (LOVENOX) injection  50 mg Subcutaneous Daily   FLUoxetine  20 mg Oral Daily   gabapentin  600 mg Oral QHS   insulin aspart  0-15 Units Subcutaneous TID WC   insulin glargine-yfgn  12 Units Subcutaneous Q2200   methimazole  5 mg Oral Once per day on Monday Tuesday Wednesday Thursday Friday Saturday   metroNIDAZOLE  500 mg Oral Q12H   morphine  15 mg Oral Q12H   mupirocin ointment  1 Application Nasal BID   pantoprazole  40 mg  Oral Daily   Continuous Infusions:  cefTRIAXone (ROCEPHIN)  IV 2 g (05/17/23 1411)   vancomycin 1,500 mg (05/17/23 1707)     LOS: 3 days   Time spent:  Azucena Fallen, DO [facility]  If 7PM-7AM, please contact night-coverage [email or web address]  05/18/2023, 5:50 AM

## 2023-05-18 NOTE — Consult Note (Addendum)
 Hospital Consult    Reason for Consult:  bilateral toe ulcer Requesting Physician:  Annamary Rummage  MRN #:  [id number]  History of Present Illness: This is a 58 y.o. female who presented to the hospital with diabetic foot wound.    She states that she does get some cramping in her legs when she walks with right worse than left but more limited in her walking by her back pain.  She states she has a back stimulator implanted that has not been working and since then she has had back, hip and knee pain.  She states she does get pain in her right foot at night that is relieved by hanging it down.  She states that she had angioplasty in [place] about 3 years ago to clean out her arteries in her legs.    She continues to smoke.    She has hx of DM, AFib, HTN, CAD, CHF, hypothyroidism, anxiety.    The pt is on a statin for cholesterol management.  The pt is not on a daily aspirin.   Other AC:  Eliquis The pt is on CCB,  for hypertension.   The pt is  on medication for diabetes PTA. Tobacco hx:  current  She is from [place] and a [place].   Past Medical History:  Diagnosis Date   Arrhythmia    Arthritis    Atrial fibrillation (HCC)    CAD (coronary artery disease)    Chronic lower back pain    Depression    Diabetes mellitus without complication (HCC)    Eczema    GAD (generalized anxiety disorder)    GERD (gastroesophageal reflux disease)    H/O vitamin D deficiency    Heart failure (HCC)    Hyperlipidemia    Hypertension    Hypothyroidism    Insomnia    Peripheral neuropathy    PVD (peripheral vascular disease) (HCC)     Past Surgical History:  Procedure Laterality Date   ANGIOPLASTY     legs   BACK SURGERY     5 different times   BREAST BIOPSY Right 07/24/2022   MM RT BREAST BX W LOC DEV 1ST LESION IMAGE BX SPEC STEREO GUIDE 07/24/2022 [facility]   CARDIAC CATHETERIZATION  2020   REPLACEMENT TOTAL HIP W/  RESURFACING IMPLANTS Right 2018   RIGHT  AND LEFT HEART CATH     ROTATOR CUFF REPAIR Right 2016   SPINAL CORD STIMULATOR INSERTION N/A 05/08/2022   Procedure: PLACEMENT OF SPINAL CORD STIMULATOR;  Surgeon: Venita Lick, MD;  Location: [facility] OR;  Service: Orthopedics;  Laterality: N/A;   SPINE SURGERY  2014,2016,2017,2019   toe amputated  09/2018   5th left toe   TOE AMPUTATION Left    great toe on left side    Allergies  Allergen Reactions   Corylus    Omeprazole     Other reaction(s): Not available   Other     Hazelnuts- SOB   Pear    Penicillin G     whelps   Trulicity [Dulaglutide]     Large itchy bumps    Prior to Admission medications   Medication Sig Start Date End Date Taking? Authorizing Provider  AIRSUPRA 90-80 MCG/ACT AERO Inhale into the lungs. 04/23/23  Yes [provider]  amLODipine (NORVASC) 10 MG tablet Take 1 tablet (10 mg total) by mouth daily. Patient taking differently: Take 10 mg by mouth at bedtime. 03/22/23   Tolia, Sunit, DO  atorvastatin (LIPITOR) 40  MG tablet Take 40 mg by mouth at bedtime. 08/08/19   [provider]  atropine 1 % ophthalmic solution Place 1 drop into the right eye daily. 05/03/23   [provider]  benzonatate (TESSALON) 200 MG capsule TAKE 1 CAPSULE BY MOUTH THREE TIMES DAILY AS NEEDED FOR COUGH    [provider]  Buprenorphine HCl-Naloxone HCl 8-2 MG FILM Place 1 Film under the tongue 3 (three) times daily as needed (pain).    [provider]  buPROPion (WELLBUTRIN XL) 300 MG 24 hr tablet Take 300 mg by mouth every morning. 04/03/22   [provider]  candesartan (ATACAND) 4 MG tablet Take 4 mg by mouth daily. 08/13/19   [provider]  cephALEXin (KEFLEX) 500 MG capsule Take 500 mg by mouth 4 (four) times daily. 05/15/23   [provider]  colchicine 0.6 MG tablet Take 0.6 mg by mouth. 05/14/23   [provider]  Continuous Glucose Sensor (DEXCOM G7 SENSOR) MISC Inject 1 application  into the skin See  admin instructions. Every 10 days 04/30/23   [provider]  Dapagliflozin Pro-metFORMIN ER (XIGDUO XR) 06-998 MG TB24 Take 2 tablets by mouth daily. 04/13/23   Shamleffer, Konrad Dolores, MD  diphenhydrAMINE (SIMPLY SLEEP) 25 MG tablet Take 25 mg by mouth at bedtime.    [provider]  doxycycline (VIBRA-TABS) 100 MG tablet Take 1 tablet by mouth every 12 (twelve) hours.    [provider]  ELIQUIS 5 MG TABS tablet Take 1 tablet (5 mg total) by mouth 2 (two) times daily. 05/02/23   Tolia, Sunit, DO  FLUoxetine (PROZAC) 20 MG capsule Take 20 mg by mouth daily. 04/04/22   [provider]  fluticasone-salmeterol (ADVAIR) 250-50 MCG/ACT AEPB Inhale 1 puff into the lungs 2 (two) times daily. 05/04/23   [provider]  gabapentin (NEURONTIN) 600 MG tablet Take 600 mg by mouth at bedtime. 12/26/22   [provider]  insulin aspart (NOVOLOG FLEXPEN) 100 UNIT/ML FlexPen Inject 10-20 Units into the skin 3 (three) times daily with meals. 04/16/23   Shamleffer, Konrad Dolores, MD  isosorbide mononitrate (IMDUR) 60 MG 24 hr tablet Take 1 tablet (60 mg total) by mouth in the morning. 03/22/23   Tolia, Sunit, DO  LINZESS 145 MCG CAPS capsule Take 145 mcg by mouth daily. 04/03/22   [provider]  methimazole (TAPAZOLE) 5 MG tablet Take 1 tablet (5 mg total) by mouth as directed. 1 tablet Monday through Saturday, none on Sundays 03/20/23   Shamleffer, Konrad Dolores, MD  metoprolol succinate (TOPROL-XL) 100 MG 24 hr tablet Take 100 mg by mouth daily. 06/12/19   [provider]  morphine (MS CONTIN) 15 MG 12 hr tablet Take 15 mg by mouth every 12 (twelve) hours. 05/10/23   [provider]  naloxone [facility]) nasal spray 4 mg/0.1 mL Place 1 spray into the nose as needed (OD).    [provider]  omeprazole (PRILOSEC) 20 MG capsule Take 20 mg by mouth daily. 06/15/21   [provider]  oseltamivir (TAMIFLU) 75 MG capsule Take 1  capsule (75 mg total) by mouth 2 (two) times daily. 04/21/23   Lorin Glass, MD  spironolactone (ALDACTONE) 25 MG tablet Take 1 tablet (25 mg total) by mouth daily. 03/22/23   Tolia, Sunit, DO  SV IRON 325 (65 Fe) MG tablet Take 325 mg by mouth 3 (three) times a week. 05/14/23   [provider]  tirzepatide Greggory Keen) 10 MG/0.5ML Pen  Inject 10 mg into the skin once a week. 04/13/23   Shamleffer, Konrad Dolores, MD  TRESIBA FLEXTOUCH 100 UNIT/ML FlexTouch Pen Inject 64 Units into the skin daily.    [provider]  valACYclovir (VALTREX) 1000 MG tablet Take 1,000 mg by mouth 3 (three) times daily. 05/01/23   [provider]  XIGDUO XR 11-998 MG TB24 Take 1 tablet by mouth 2 (two) times daily. 05/01/23   [provider]  zolpidem (AMBIEN CR) 6.25 MG CR tablet Take 12.5 mg by mouth at bedtime. 12/26/22   [provider]    Social History   Socioeconomic History   Marital status: Widowed    Spouse name: Not on file   Number of children: 0   Years of education: Not on file   Highest education level: Bachelor's degree (e.g., BA, AB, BS)  Occupational History    Comment: retired Runner, broadcasting/film/video  Tobacco Use   Smoking status: Every Day    Current packs/day: 0.50    Average packs/day: 0.5 packs/day for 30.0 years (15.0 ttl pk-yrs)    Types: Cigarettes   Smokeless tobacco: Never  Vaping Use   Vaping status: Never Used  Substance and Sexual Activity   Alcohol use: Not Currently   Drug use: Not Currently   Sexual activity: Not on file  Other Topics Concern   Not on file  Social History Narrative   Lives with mom, moved from [place] 07/2019   Caffeine- 3 c weekly   Social Drivers of Health   Financial Resource Strain: Not on file  Food Insecurity: No Food Insecurity (05/15/2023)   Hunger Vital Sign    Worried About Running Out of Food in the Last Year: Never true    Ran Out of Food in the Last Year: Never true  Transportation Needs: No Transportation  Needs (05/15/2023)   PRAPARE - Administrator, Civil Service (Medical): No    Lack of Transportation (Non-Medical): No  Physical Activity: Not on file  Stress: Not on file  Social Connections: Unknown (11/30/2021)   [facility]   Social Network    Social Network: Not on file  Intimate Partner Violence: Not At Risk (05/15/2023)   Humiliation, Afraid, Rape, and Kick questionnaire    Fear of Current or Ex-Partner: No    Emotionally Abused: No    Physically Abused: No    Sexually Abused: No     Family History  Problem Relation Age of Onset   Hypertension Mother    Hyperlipidemia Mother    Diabetes Mother    Stroke Mother    Clotting disorder Father    Hypertension Father    Hyperlipidemia Father    Hyperlipidemia Sister    Diabetes Brother    Neuropathy Brother     ROS: [x]  Positive   [ ]  Negative   [ ]  All sytems reviewed and are negative Cardiac: [x]  CHF [x]  afib [x]  HTN  Vascular: [x]  pain in legs while walking []  pain in legs at rest [x]  pain in legs at night [x]  non-healing ulcers []  hx of DVT []  swelling in legs  Pulmonary: []  asthma/wheezing []  home O2  Neurologic: []  hx of CVA []  mini stroke [x]  neuropathy  Hematologic: []  hx of cancer  Endocrine:   [x]  diabetes [x]  thyroid disease  GI [x]  GERD  GU: []  CKD/renal failure []  HD--[]  M/W/F or []  T/T/S  Psychiatric: [x]  anxiety [x]  depression  Musculoskeletal: [x]  back pain  Integumentary: []   rashes [x]  ulcers  Constitutional: []  fever  []  chills  Physical Examination  Vitals:   05/18/23 0326 05/18/23 1019  BP: (!) 152/75 (!) 166/74  Pulse: 78 72  Resp: 18 16  Temp: 98.8 F (37.1 C) 98.6 F (37 C)  SpO2: 91% 100%   Body mass index is 33.17 kg/m.  General:  WDWN in NAD Gait: Not observed HENT: WNL, normocephalic Pulmonary: normal non-labored breathing Cardiac: regular Abdomen: obese, soft, NT; aortic pulse is not palpable Skin:  without rashes Vascular Exam/Pulses:  Right Left  Radial 2+ (normal) 2+ (normal)  Femoral 1+ (weak) 2+ (normal)  Popliteal Unable to palpate Unable to palpate  DP Unable to palpate 2+ (normal)  PT Unable to palpate Unable to palpate   Extremities: right great toe ulcer and left 2nd toe ulcer  Right great toe ulcer   Left 2nd toe   Musculoskeletal: no muscle wasting or atrophy  Neurologic: A&O X 3 Psychiatric:  The pt has Normal affect.   CBC    Component Value Date/Time   WBC 6.7 05/17/2023 0557   RBC 4.04 05/17/2023 0557   HGB 10.4 (L) 05/17/2023 0557   HCT 35.5 (L) 05/17/2023 0557   PLT 288 05/17/2023 0557   MCV 87.9 05/17/2023 0557   MCH 25.7 (L) 05/17/2023 0557   MCHC 29.3 (L) 05/17/2023 0557   RDW 20.6 (H) 05/17/2023 0557   LYMPHSABS 2.2 05/15/2023 1316   MONOABS 0.7 05/15/2023 1316   EOSABS 0.1 05/15/2023 1316   BASOSABS 0.0 05/15/2023 1316    BMET    Component Value Date/Time   NA 141 05/17/2023 0557   NA 138 12/06/2020 1134   K 4.1 05/17/2023 0557   CL 106 05/17/2023 0557   CO2 24 05/17/2023 0557   GLUCOSE 80 05/17/2023 0557   BUN 8 05/17/2023 0557   BUN 9 12/06/2020 1134   CREATININE 0.75 05/17/2023 0557   CALCIUM 8.7 (L) 05/17/2023 0557   GFRNONAA >60 05/17/2023 0557     Non-Invasive Vascular Imaging:   ABI 05/17/2023 +-------+-----------+-----------+------------+------------+  ABI/TBIToday's ABIToday's TBIPrevious ABIPrevious TBI  +-------+-----------+-----------+------------+------------+  Right 0.86       0          1.01        0.67          +-------+-----------+-----------+------------+------------+  Left  0.92       0.87       1.02        0.97          +-------+-----------+-----------+------------+------------+     ASSESSMENT/PLAN: This is a 58 y.o. female with right great toe and left 2nd toe ulceration   -pt with right great toe ulcer that has been present on and off since last July.  She does have rest pain in  the right foot and a toe pressure of zero.  She does not have a palpable right pedal pulse.  On the left, she does have a palpable left DP pulse.   -plan was for toe amputations today with podiatry but this will be postponed as pt will need angiogram to evaluate right leg and we will schedule this for Monday and most likely surgery with podiatry on Tuesday.   -please continue to hold Eliquis.  Pt is not on asa due to being on Eliquis.  Continue statin. -Dr. Myra Gianotti to evaluate pt and determine further plan   Doreatha Massed, PA-C [facility] [phone]   I agree with the above.  I have seen  and evaluated the patient.  Briefly this is a 58 year old female with bilateral lower extremity ulcers.  She has a history of bilateral lower extremity interventions in the past in [place].  She has palpable femoral pulses bilaterally.  She has a palpable left pedal pulse but not on the right.  We discussed proceeding with angiography prior to toe amputation.  This has been scheduled for Monday.  It will be a left femoral access with intervention on the right leg if indicated.  We will also need images of the left leg as she has bilateral ulcers.  I discussed that left leg intervention, if needed would be on a different day.  All of her questions were answered.  Continue to hold Eliquis.  She will be n.p.o. after midnight on Sunday  Wells Walterine Amodei

## 2023-05-18 NOTE — Plan of Care (Signed)
  Problem: Education: Goal: Ability to describe self-care measures that may prevent or decrease complications (Diabetes Survival Skills Education) will improve Outcome: Progressing   Problem: Activity: Goal: Risk for activity intolerance will decrease Outcome: Progressing   Problem: Nutrition: Goal: Adequate nutrition will be maintained Outcome: Progressing   Problem: Safety: Goal: Ability to remain free from injury will improve Outcome: Progressing

## 2023-05-19 DIAGNOSIS — M869 Osteomyelitis, unspecified: Secondary | ICD-10-CM | POA: Diagnosis not present

## 2023-05-19 DIAGNOSIS — L03119 Cellulitis of unspecified part of limb: Secondary | ICD-10-CM

## 2023-05-19 LAB — CBC
HCT: 33.7 % — ABNORMAL LOW (ref 36.0–46.0)
Hemoglobin: 10.3 g/dL — ABNORMAL LOW (ref 12.0–15.0)
MCH: 25.8 pg — ABNORMAL LOW (ref 26.0–34.0)
MCHC: 30.6 g/dL (ref 30.0–36.0)
MCV: 84.3 fL (ref 80.0–100.0)
Platelets: 269 10*3/uL (ref 150–400)
RBC: 4 MIL/uL (ref 3.87–5.11)
RDW: 20.1 % — ABNORMAL HIGH (ref 11.5–15.5)
WBC: 5.7 10*3/uL (ref 4.0–10.5)
nRBC: 0 % (ref 0.0–0.2)

## 2023-05-19 LAB — BASIC METABOLIC PANEL WITH GFR
Anion gap: 8 (ref 5–15)
BUN: 5 mg/dL — ABNORMAL LOW (ref 6–20)
CO2: 26 mmol/L (ref 22–32)
Calcium: 8.8 mg/dL — ABNORMAL LOW (ref 8.9–10.3)
Chloride: 106 mmol/L (ref 98–111)
Creatinine, Ser: 0.65 mg/dL (ref 0.44–1.00)
GFR, Estimated: >60 mL/min (ref >60)
Glucose, Bld: 83 mg/dL (ref 70–99)
Potassium: 3.7 mmol/L (ref 3.5–5.1)
Sodium: 140 mmol/L (ref 135–145)

## 2023-05-19 LAB — GLUCOSE, CAPILLARY
Glucose-Capillary: 81 mg/dL (ref 70–99)
Glucose-Capillary: 121 mg/dL — ABNORMAL HIGH (ref 70–99)
Glucose-Capillary: 117 mg/dL — ABNORMAL HIGH (ref 70–99)
Glucose-Capillary: 106 mg/dL — ABNORMAL HIGH (ref 70–99)

## 2023-05-19 LAB — VANCOMYCIN, PEAK: Vancomycin Pk: 26 ug/mL — ABNORMAL LOW (ref 30–40)

## 2023-05-19 MED ORDER — ONDANSETRON HCL 4 MG/2ML IJ SOLN
4.0000 mg | Freq: Four times a day (QID) | INTRAMUSCULAR | Status: DC | PRN
  Administered 2023-05-19 – 2023-05-21 (×2): 4 mg via INTRAVENOUS
  Filled 2023-05-19: qty 2

## 2023-05-19 NOTE — Plan of Care (Signed)
  Problem: Education: Goal: Ability to describe self-care measures that may prevent or decrease complications (Diabetes Survival Skills Education) will improve Outcome: Progressing   Problem: Coping: Goal: Ability to adjust to condition or change in health will improve Outcome: Progressing   Problem: Nutritional: Goal: Maintenance of adequate nutrition will improve Outcome: Progressing

## 2023-05-19 NOTE — Progress Notes (Signed)
 Pharmacy Antibiotic Note  Carmen Armstrong is a 58 y.o. female admitted on 05/15/2023 with  toe wounds / osteomyelitis .  Pharmacy has been consulted for Vancomycin dosing.  Pt also continues on Flagyl and Rocephin per MD orders.  Today is abx d# 5.  Plan for amputation has been delayed until early next week pending VVS eval / procedure.  Renal fxn remains stable.  Anticipate abx will continue until amputation, therefor will check Vancomycin levels this weekend.  Plan: Continue Vancomycin 1500mg  IV q24h Vancomycin peak ordered for 4/5 at 2000 (1hr after dose infusion complete) Vancomycin trough to be ordered for 4/6 at 1600 (1hr prior to dose due)  [employer], Pharm.D., BCPS Clinical Pharmacist Clinical phone for 05/19/2023 from 7:30-3:00 is [phone].  **Pharmacist phone directory can be found on [email or web address] listed under [facility].  05/19/2023 8:28 AM

## 2023-05-19 NOTE — Progress Notes (Signed)
 PROGRESS NOTE    Carmen Armstrong  ION:[id number] DOB: 1965-04-07 DOA: 05/15/2023 PCP: Lewis Moccasin, MD   Brief Narrative:  Carmen Armstrong is a 58 y.o. female with medical history significant of poorly controlled T2DM with polyneuropathy, atrial fibrillation on eliquis, HTN, chronic diastolic HF, CAD, HLD, PAD, hypothyroidism, depression, anxiety presenting to the ED with worsening right great toe and left second toe wound with worsening and uncontrolled pain with notable swelling and drainage.  She was prescribed antibiotics in the outpatient setting previously but has not been compliant with these.  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 -Patient does not meet sepsis criteria -Podiatry consulted, appreciate insight recommendations -plan for right hallux and left second toe amputation, 05/18/23  -Continue ceftriaxone, Flagyl, vancomycin -continue to follow cultures -MRI unable to be performed due to patient's spinal stimulator -CT bilateral lower extremity confirms osteomyelitis -Bilateral US-ABI shows RLE mild arterial disease; LLE WNL- Podiatry requesting formal vascular consult -appreciate insight and recommendations Patient has planned angiogram with vascular surgery Monday, tentative surgical evaluation with podiatry Tuesday 05/22/23  Normocytic, iron deficiency, anemia -Questionably acute on chronic although unknown baseline, recent labs with hemoglobin around 12 -Possibly dilutional, FOBT negative, no signs or symptoms of bleeding -Continue to follow closely given patient continues on home Eliquis -Iron studies confirm low iron, discussed appropriate diet supplementation   Uncontrolled T2DM with  polyneuropathy -A1c 13.1 just last month -Discussed diabetic diet, medication compliance -Continue sliding scale insulin, 25 units at bedtime glargine -Home regimen includes 64 units Tresiba, 3 times daily sliding scale, dapagliflozin/metformin/tirzepatide -Continue gabapentin/fluoxetine   Hypotension History of essential HTN -Hold home metoprolol, spironolactone, Imdur, candesartan, amlodipine  Parosymal Atrial fibrillation -Continue Eliquis -Holding metoprolol given above  Chronic diastolic HF -Echo last year with EF 65%, grade 1 diastolic dysfunction -Holding core measures given above -Follow I's and O's, daily weights -Not on diuretics at baseline   CAD PVD HLD -Continue home Eliquis, atorvastatin -Lower extremity ABIs pending  Hyperthyroidism -Continue methimazole   Depression and anxiety -Continue fluoxetine, bupropion   Chronic pain Continue MS Contin 15 mg twice daily per PDMP  DVT prophylaxis:  Code Status:   Code Status: Full Code Family Communication: None present  Status is: Inpatient  Dispo: The patient is from: Home              Anticipated d/c is to: To be determined              Anticipated d/c date is: 72+ hours              Patient currently not medically stable for discharge  Consultants:  Orthopedic surgery  Procedures:  Pending above  Antimicrobials:  Vancomycin, Flagyl, ceftriaxone  Subjective: No acute issues or events overnight, pain currently well-controlled denies nausea vomiting diarrhea constipation headache fevers chills or chest pain, patient continues to voice concern that despite "her best efforts she continues to have infections" -we discussed that she remains high risk for infection due to her other chronic comorbid conditions.  Objective: Vitals:   05/18/23 1602 05/18/23 1916 05/19/23 0416 05/19/23 0500  BP: (!) 152/55 (!) 160/79 (!) 104/48   Pulse: 76 79 77   Resp: 18 18 18  Temp: 97.6 F (36.4 C) 98.6 F (37 C)  98.1 F (36.7 C)   TempSrc:  Oral Oral   SpO2:  96% 94%   Weight:    101.1 kg  Height:       No intake or output data in the 24 hours ending 05/19/23 0723  Filed Weights   05/16/23 0500 05/17/23 0317 05/19/23 0500  Weight: 102.3 kg 101.9 kg 101.1 kg    Examination:  General:  Pleasantly resting in bed, No acute distress. HEENT:  Normocephalic atraumatic.  Sclerae nonicteric, noninjected.  Extraocular movements intact bilaterally. Neck:  Without mass or deformity.  Trachea is midline. Lungs:  Clear to auscultate bilaterally without rhonchi, wheeze, or rales. Heart:  Regular rate and rhythm.  Without murmurs, rubs, or gallops. Abdomen:  Soft, nontender, nondistended.  Without guarding or rebound. Extremities: Bilateral lower extremities without pitting edema, bandages clean dry intact  Data Reviewed: I have personally reviewed following labs and imaging studies  CBC: Recent Labs  Lab 05/15/23 1316 05/15/23 2305 05/16/23 0508 05/17/23 0557 05/19/23 0535  WBC 7.1  --  5.9 6.7 5.7  NEUTROABS 4.0  --   --   --   --   HGB 9.8* 10.2* 9.0* 10.4* 10.3*  HCT 33.6* 34.6* 30.1* 35.5* 33.7*  MCV 87.7  --  86.7 87.9 84.3  PLT 278  --  236 288 269   Basic Metabolic Panel: Recent Labs  Lab 05/15/23 1316 05/16/23 0508 05/17/23 0557 05/19/23 0535  NA 138 138 141 140  K 4.2 4.1 4.1 3.7  CL 105 110 106 106  CO2 21* 20* 24 26  GLUCOSE 75 71 80 83  BUN 10 9 8  5*  CREATININE 0.93 0.81 0.75 0.65  CALCIUM 8.7* 8.3* 8.7* 8.8*   GFR: Estimated Creatinine Clearance: 97 mL/min (by C-G formula based on SCr of 0.65 mg/dL). Liver Function Tests: Recent Labs  Lab 05/15/23 1316  AST 21  ALT 16  ALKPHOS 59  BILITOT 0.5  PROT 6.9  ALBUMIN 3.0*   CBG: Recent Labs  Lab 05/18/23 0641 05/18/23 1127 05/18/23 1605 05/18/23 2150 05/19/23 0601  GLUCAP 68* 70 138* 107* 81   Anemia Panel: No results for input(s): "VITAMINB12", "FOLATE", "FERRITIN", "TIBC", "IRON", "RETICCTPCT" in  the last 72 hours.  Sepsis Labs: Recent Labs  Lab 05/15/23 1330 05/15/23 1546  LATICACIDVEN 0.9 1.2    Recent Results (from the past 240 hours)  Blood Cultures x 2 sites     Status: None (Preliminary result)   Collection Time: 05/15/23  3:35 PM   Specimen: BLOOD  Result Value Ref Range Status   Specimen Description BLOOD RIGHT ANTECUBITAL  Final   Special Requests   Final    BOTTLES DRAWN AEROBIC AND ANAEROBIC Blood Culture adequate volume   Culture   Final    NO GROWTH 4 DAYS Performed at [facility], [address] [place], [place] [address]    Report Status PENDING  Incomplete  Blood Cultures x 2 sites     Status: None (Preliminary result)   Collection Time: 05/15/23  3:42 PM   Specimen: BLOOD LEFT HAND  Result Value Ref Range Status   Specimen Description BLOOD LEFT HAND  Final   Special Requests   Final    BOTTLES DRAWN AEROBIC ONLY Blood Culture adequate volume   Culture   Final    NO GROWTH 4 DAYS Performed at [facility], [address] [place], [place] [address]  Report Status PENDING  Incomplete  Surgical PCR screen     Status: None   Collection Time: 05/15/23  5:30 PM   Specimen: Nasal Mucosa; Nasal Swab  Result Value Ref Range Status   MRSA, PCR NEGATIVE NEGATIVE Final   Staphylococcus aureus NEGATIVE NEGATIVE Final    Comment: (NOTE) The Xpert SA Assay (FDA approved for NASAL specimens in patients 28 years of age and older), is one component of a comprehensive surveillance program. It is not intended to diagnose infection nor to guide or monitor treatment. Performed at [facility], [address] [place], [place] [address]          Radiology Studies: VAS [place] ABI WITH/WO TBI Result Date: 05/18/2023  LOWER EXTREMITY DOPPLER STUDY Patient Name:  Carmen Armstrong  Date of Exam:   05/17/2023 Medical Rec #: [id number]             Accession #:    [id number] Date of Birth: July 19, 1965             Patient Gender: F Patient Age:   33  years Exam Location:  [facility] Procedure:      VAS [place] ABI WITH/WO TBI Referring Phys: Merrilyn Puma --------------------------------------------------------------------------------  Indications: Peripheral artery disease. High Risk         Hypertension, hyperlipidemia, Diabetes, coronary artery Factors:          disease. Other Factors: PAD.  Comparison Study: Reduced waveforms, ABIs, and TBIs since previous exam                   02/25/21. Performing Technologist: Shona Simpson  Examination Guidelines: A complete evaluation includes at minimum, Doppler waveform signals and systolic blood pressure reading at the level of bilateral brachial, anterior tibial, and posterior tibial arteries, when vessel segments are accessible. Bilateral testing is considered an integral part of a complete examination. Photoelectric Plethysmograph (PPG) waveforms and toe systolic pressure readings are included as required and additional duplex testing as needed. Limited examinations for reoccurring indications may be performed as noted.  ABI Findings: +---------+------------------+-----+----------+----------+ Right    Rt Pressure (mmHg)IndexWaveform  Comment    +---------+------------------+-----+----------+----------+ Brachial 171                    triphasic            +---------+------------------+-----+----------+----------+ PTA      155               0.86 monophasic           +---------+------------------+-----+----------+----------+ DP       151               0.84 monophasic           +---------+------------------+-----+----------+----------+ Great Toe0                 0.00 Absent    Second toe +---------+------------------+-----+----------+----------+ +---------+------------------+-----+---------+----------+ Left     Lt Pressure (mmHg)IndexWaveform Comment    +---------+------------------+-----+---------+----------+ Brachial 180                    triphasic            +---------+------------------+-----+---------+----------+ PTA      165               0.92 triphasic           +---------+------------------+-----+---------+----------+ DP       159  0.88 biphasic            +---------+------------------+-----+---------+----------+ Great Toe156               0.87 Abnormal Second toe +---------+------------------+-----+---------+----------+ +-------+-----------+-----------+------------+------------+ ABI/TBIToday's ABIToday's TBIPrevious ABIPrevious TBI +-------+-----------+-----------+------------+------------+ Right  0.86       0          1.01        0.67         +-------+-----------+-----------+------------+------------+ Left   0.92       0.87       1.02        0.97         +-------+-----------+-----------+------------+------------+  Bilateral ABIs appear decreased compared to prior study on 02/25/21. Bilateral TBIs appear decreased compared to prior study on 02/25/21.  Summary: Right: Resting right ankle-brachial index indicates mild right lower extremity arterial disease. The right toe-brachial index is abnormal. Left: Resting left ankle-brachial index indicates mild left lower extremity arterial disease. The left toe-brachial index is normal. *See table(s) above for measurements and observations.  Electronically signed by Heath Lark on 05/18/2023 at 1:12:24 PM.    Final         Scheduled Meds:  acetaminophen  1,000 mg Oral Q8H   Or   acetaminophen  650 mg Rectal Q8H   atorvastatin  40 mg Oral QHS   buPROPion  300 mg Oral q morning   enoxaparin (LOVENOX) injection  50 mg Subcutaneous Daily   FLUoxetine  20 mg Oral Daily   gabapentin  600 mg Oral QHS   insulin aspart  0-15 Units Subcutaneous TID WC   insulin glargine-yfgn  12 Units Subcutaneous Q2200   methimazole  5 mg Oral Once per day on Monday Tuesday Wednesday Thursday Friday Saturday   metroNIDAZOLE  500 mg Oral Q12H   morphine  15 mg Oral Q12H   mupirocin  ointment  1 Application Nasal BID   nicotine  14 mg Transdermal Daily   pantoprazole  40 mg Oral Daily   Continuous Infusions:  cefTRIAXone (ROCEPHIN)  IV 2 g (05/18/23 1410)   vancomycin 1,500 mg (05/18/23 1753)     LOS: 4 days   Time spent:  Azucena Fallen, DO [facility]  If 7PM-7AM, please contact night-coverage [email or web address]  05/19/2023, 7:23 AM

## 2023-05-19 NOTE — Progress Notes (Signed)
   PODIATRY PROGRESS NOTE  NAME Carmen Armstrong MRN [id number] DOB 1965-06-18 DOA 05/15/2023   Reason for consult:  Chief Complaint  Patient presents with   Toe Injury     History of present illness: 58 y.o. female with ulcer of the right hallux and left second toe.  Denies any fevers or chills.  She has no concerns today.  Scheduled for angio on Monday with vascular.   Vitals:   05/19/23 0416 05/19/23 0733  BP: (!) 104/48 (!) 156/80  Pulse: 77 69  Resp: 18 18  Temp: 98.1 F (36.7 C) 98.6 F (37 C)  SpO2: 94% 90%       Latest Ref Rng & Units 05/19/2023    5:35 AM 05/17/2023    5:57 AM 05/16/2023    5:08 AM  CBC  WBC 4.0 - 10.5 K/uL 5.7  6.7  5.9   Hemoglobin 12.0 - 15.0 g/dL 95.6  21.3  9.0   Hematocrit 36.0 - 46.0 % 33.7  35.5  30.1   Platelets 150 - 400 K/uL 269  288  236        Latest Ref Rng & Units 05/19/2023    5:35 AM 05/17/2023    5:57 AM 05/16/2023    5:08 AM  BMP  Glucose 70 - 99 mg/dL 83  80  71   BUN 6 - 20 mg/dL 5  8  9    Creatinine 0.44 - 1.00 mg/dL 0.86  5.78  4.69   Sodium 135 - 145 mmol/L 140  141  138   Potassium 3.5 - 5.1 mmol/L 3.7  4.1  4.1   Chloride 98 - 111 mmol/L 106  106  110   CO2 22 - 32 mmol/L 26  24  20    Calcium 8.9 - 10.3 mg/dL 8.8  8.7  8.3       Physical Exam: General: AO x 3, NAD  Dermatology: Stable ulcerations noted to the right hallux and left second toe.  There is no purulence noted today.  There is edema present of the digits.  There is no fluctuation or crepitation.  Vascular: Unable to palpate pulse right foot, palpable left DP.  Neurological: Decreased  Musculoskeletal Exam: No significant pain on exam    ASSESSMENT/PLAN OF CARE  -Plan for angio on Monday with like surgery on Tuesday or Wednesday with Dr. Annamary Rummage.  -Continue current antibiotics -Podiatry will continue to follow.     Please contact me directly with any questions or concerns.     Ovid Curd, DPM [facility]  Dr.  Lesia Sago. Skylen Danielsen, DPM    [address] [place], [place] [address]                Office [phone]  Fax [phone]

## 2023-05-20 DIAGNOSIS — M869 Osteomyelitis, unspecified: Secondary | ICD-10-CM | POA: Diagnosis not present

## 2023-05-20 LAB — CULTURE, BLOOD (ROUTINE X 2)
Culture: NO GROWTH
Culture: NO GROWTH
Special Requests: ADEQUATE
Special Requests: ADEQUATE

## 2023-05-20 LAB — GLUCOSE, CAPILLARY
Glucose-Capillary: 127 mg/dL — ABNORMAL HIGH (ref 70–99)
Glucose-Capillary: 101 mg/dL — ABNORMAL HIGH (ref 70–99)
Glucose-Capillary: 132 mg/dL — ABNORMAL HIGH (ref 70–99)
Glucose-Capillary: 95 mg/dL (ref 70–99)
Glucose-Capillary: 169 mg/dL — ABNORMAL HIGH (ref 70–99)

## 2023-05-20 LAB — VANCOMYCIN, TROUGH: Vancomycin Tr: 7 ug/mL — ABNORMAL LOW (ref 15–20)

## 2023-05-20 MED ORDER — ZOLPIDEM TARTRATE 5 MG PO TABS
5.0000 mg | ORAL_TABLET | Freq: Every evening | ORAL | Status: AC | PRN
  Administered 2023-05-21 – 2023-05-22 (×2): 5 mg via ORAL
  Filled 2023-05-20 (×2): qty 1

## 2023-05-20 MED ORDER — VANCOMYCIN HCL IN DEXTROSE 1-5 GM/200ML-% IV SOLN
1000.0000 mg | Freq: Two times a day (BID) | INTRAVENOUS | Status: DC
  Administered 2023-05-20 – 2023-05-22 (×4): 1000 mg via INTRAVENOUS
  Filled 2023-05-20 (×5): qty 200

## 2023-05-20 MED ORDER — HYDRALAZINE HCL 20 MG/ML IJ SOLN
5.0000 mg | Freq: Once | INTRAMUSCULAR | Status: AC
  Administered 2023-05-20: 5 mg via INTRAVENOUS
  Filled 2023-05-20: qty 1

## 2023-05-20 NOTE — Plan of Care (Signed)
  Problem: Metabolic: Goal: Ability to maintain appropriate glucose levels will improve Outcome: Progressing   Problem: Nutritional: Goal: Maintenance of adequate nutrition will improve Outcome: Progressing   Problem: Skin Integrity: Goal: Risk for impaired skin integrity will decrease Outcome: Progressing   Problem: Clinical Measurements: Goal: Ability to maintain clinical measurements within normal limits will improve Outcome: Progressing Goal: Will remain free from infection Outcome: Progressing   Problem: Activity: Goal: Risk for activity intolerance will decrease Outcome: Progressing   Problem: Pain Managment: Goal: General experience of comfort will improve and/or be controlled Outcome: Progressing   Problem: Safety: Goal: Ability to remain free from injury will improve Outcome: Progressing   Problem: Skin Integrity: Goal: Risk for impaired skin integrity will decrease Outcome: Progressing

## 2023-05-20 NOTE — Progress Notes (Signed)
   PODIATRY PROGRESS NOTE  NAME Carmen Armstrong MRN [id number] DOB [birth year] DOA 05/15/2023  Chief Complaint  Patient presents with   Toe Injury     History of present illness: 58 y.o. female with ulcer of the right hallux and left second toe.  Scheduled for angio tomorrow.    Vitals:   05/20/23 0635 05/20/23 0851  BP: (!) 157/59 (!) 171/79  Pulse: 71 70  Resp:  18  Temp: 97.7 F (36.5 C) 97.7 F (36.5 C)  SpO2: 90% 99%    CBC    Component Value Date/Time   WBC 5.7 05/19/2023 0535   RBC 4.00 05/19/2023 0535   HGB 10.3 (L) 05/19/2023 0535   HCT 33.7 (L) 05/19/2023 0535   PLT 269 05/19/2023 0535   MCV 84.3 05/19/2023 0535   MCH 25.8 (L) 05/19/2023 0535   MCHC 30.6 05/19/2023 0535   RDW 20.1 (H) 05/19/2023 0535   LYMPHSABS 2.2 05/15/2023 1316   MONOABS 0.7 05/15/2023 1316   EOSABS 0.1 05/15/2023 1316   BASOSABS 0.0 05/15/2023 1316        Latest Ref Rng & Units 05/19/2023    5:35 AM 05/17/2023    5:57 AM 05/16/2023    5:08 AM  BMP  Glucose 70 - 99 mg/dL 83  80  71   BUN 6 - 20 mg/dL 5  8  9    Creatinine 0.44 - 1.00 mg/dL 9.14  7.82  9.56   Sodium 135 - 145 mmol/L 140  141  138   Potassium 3.5 - 5.1 mmol/L 3.7  4.1  4.1   Chloride 98 - 111 mmol/L 106  106  110   CO2 22 - 32 mmol/L 26  24  20    Calcium 8.9 - 10.3 mg/dL 8.8  8.7  8.3         Physical Exam: General: AO x 3, NAD  Dermatology: Stable ulcerations noted to the right hallux and left second toe.  There is no purulence noted today.  There is edema present of the digits.  There is no fluctuation or crepitation. No changes.   Vascular: Unable to palpate pulse right foot, palpable left DP.  Neurological: Decreased  Musculoskeletal Exam: No significant pain on exam    ASSESSMENT/PLAN OF CARE  -Plan for angio on Monday with like surgery on Tuesday or Wednesday with Dr. Annamary Rummage.  -Continue current antibiotics- on vancomycin, metronidazole, ceftriaxone -Podiatry will continue to follow.     Please contact me directly with any questions or concerns.     Ovid Curd, DPM [facility]  Carmen Armstrong, DPM    [address] [place], [place] [address]                Office [phone]  Fax [phone]

## 2023-05-20 NOTE — Progress Notes (Signed)
 Pharmacy Antibiotic Note  Carmen Armstrong is a 58 y.o. female admitted on 05/15/2023 with  toe wounds / osteomyelitis .  Pharmacy has been consulted for Vancomycin dosing.  Pt also continues on Flagyl and Rocephin per MD orders.  Today is abx day # 5.  Plan for amputation has been delayed until early this week pending angiogram. Renal fxn remains stable. Anticipate abx will continue until amputation, therefore Vancomycin levels checked this weekend.  Vanc peak level post-dose on 4/5 = 26 mcg/ml Vanc trough level prior to dose on 4/6 = 7 mcg/ml Calculated AUC is subtherapeutic at 362.  Plan: Modify Vanc regimen to 1 gm IV q12hrs. Estimated AUC on new regimen = 482 Target AUC 400-550. Monitor renal function, angiogram and surgical plans.  Height: 5\' 9"  (175.3 cm) Weight: 101.1 kg (222 lb 14.2 oz) IBW/kg (Calculated) : 66.2  Temp (24hrs), Avg:97.8 F (36.6 C), Min:97.7 F (36.5 C), Max:98 F (36.7 C)  Recent Labs  Lab 05/15/23 1316 05/15/23 1330 05/15/23 1546 05/16/23 0508 05/17/23 0557 05/19/23 0535 05/19/23 2015 05/20/23 1548  WBC 7.1  --   --  5.9 6.7 5.7  --   --   CREATININE 0.93  --   --  0.81 0.75 0.65  --   --   LATICACIDVEN  --  0.9 1.2  --   --   --   --   --   VANCOTROUGH  --   --   --   --   --   --   --  7*  VANCOPEAK  --   --   --   --   --   --  26*  --     Estimated Creatinine Clearance: 97 mL/min (by C-G formula based on SCr of 0.65 mg/dL).    Allergies  Allergen Reactions   Other Shortness Of Breath    Hazelnuts   Corylus Other (See Comments)    Unknown    Omeprazole Other (See Comments)    Unknown    Pear Other (See Comments)    Unknown    Penicillin G Other (See Comments)    Whelps   Trulicity [Dulaglutide] Other (See Comments)    Large itchy bumps    Antimicrobials this admission: Vancomycin 4/1>> Metronidazole 4/1 >> Ceftriaxone 4/1 >>  Dose adjustments this admission: 4/5 peak = 26 mcg/ml; 4/6 trough = 7 mcg/ml 4/6: AUC 362 on  1500 mg IV q24hrs > changed to 1gm IV q12hrs  Microbiology results: 4/1 blood: negative 4/1 MRSA PCR: negative  Thank you for allowing pharmacy to be a part of this patient's care.  Dennie Fetters, [place] 05/20/2023 5:48 PM

## 2023-05-20 NOTE — Progress Notes (Signed)
 PROGRESS NOTE    Carmen Armstrong  MVH:[id number] DOB: [birth year] DOA: 05/15/2023 PCP: Lewis Moccasin, MD   Brief Narrative:  Carmen Armstrong is a 58 y.o. female with medical history significant of poorly controlled T2DM with polyneuropathy, atrial fibrillation on eliquis, HTN, chronic diastolic HF, CAD, HLD, PAD, hypothyroidism, depression, anxiety presenting to the ED with worsening right great toe and left second toe wound with worsening and uncontrolled pain with notable swelling and drainage.  She was prescribed antibiotics in the outpatient setting previously but has not been compliant with these.  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 -Patient does not meet sepsis criteria -Podiatry consulted, appreciate insight recommendations -plan for right hallux and left second toe amputation, 05/18/23  -Continue ceftriaxone, Flagyl, vancomycin -continue to follow cultures -MRI unable to be performed due to patient's spinal stimulator -CT bilateral lower extremity confirms osteomyelitis -Bilateral US-ABI shows RLE mild arterial disease; LLE WNL- Podiatry requesting formal vascular consult -appreciate insight and recommendations Patient has planned angiogram with vascular surgery Monday, tentative surgical evaluation with podiatry Tuesday 05/22/23  Normocytic, iron deficiency, anemia -Questionably acute on chronic although unknown baseline, recent labs with hemoglobin around 12 -Possibly dilutional, FOBT negative, no signs or symptoms of bleeding -Continue to follow closely given patient continues on home Eliquis -Iron studies confirm low iron, discussed appropriate diet supplementation    Uncontrolled T2DM with polyneuropathy -A1c 13.1 just last month -Discussed diabetic diet, medication compliance -Continue sliding scale insulin, 25 units at bedtime glargine -Home regimen includes 64 units Tresiba, 3 times daily sliding scale, dapagliflozin/metformin/tirzepatide -Continue gabapentin/fluoxetine   Hypotension History of essential HTN -Hold home metoprolol, spironolactone, Imdur, candesartan, amlodipine  Parosymal Atrial fibrillation -Continue Eliquis -Holding metoprolol given above  Chronic diastolic HF -Echo last year with EF 65%, grade 1 diastolic dysfunction -Holding core measures given above -Follow I's and O's, daily weights -Not on diuretics at baseline   CAD PVD HLD -Continue home Eliquis, atorvastatin -Lower extremity ABIs pending  Hyperthyroidism -Continue methimazole   Depression and anxiety -Continue fluoxetine, bupropion   Chronic pain Continue MS Contin 15 mg twice daily per PDMP  DVT prophylaxis:  Code Status:   Code Status: Full Code Family Communication: None present  Status is: Inpatient  Dispo: The patient is from: Home              Anticipated d/c is to: To be determined              Anticipated d/c date is: 72+ hours              Patient currently not medically stable for discharge  Consultants:  Orthopedic surgery  Procedures:  Pending above  Antimicrobials:  Vancomycin, Flagyl, ceftriaxone  Subjective: No acute issues or events overnight, pain currently well-controlled denies nausea vomiting diarrhea constipation headache fevers chills or chest pain, patient continues to voice concern that despite "her best efforts she continues to have infections" -we discussed that she remains high risk for infection due to her other chronic comorbid conditions.  Objective: Vitals:   05/19/23 0500 05/19/23 0733 05/19/23 1937 05/20/23 0635  BP:  (!) 156/80 (!) 157/68 (!) 157/59  Pulse:  69 81  71  Resp:  18 18   Temp:  98.6 F (37  C) 98 F (36.7 C) 97.7 F (36.5 C)  TempSrc:  Oral Oral Oral  SpO2:  90% 97% 90%  Weight: 101.1 kg     Height:        Intake/Output Summary (Last 24 hours) at 05/20/2023 0723 Last data filed at 05/19/2023 1700 Gross per 24 hour  Intake 720 ml  Output 0 ml  Net 720 ml    Filed Weights   05/16/23 0500 05/17/23 0317 05/19/23 0500  Weight: 102.3 kg 101.9 kg 101.1 kg    Examination:  General:  Pleasantly resting in bed, No acute distress. HEENT:  Normocephalic atraumatic.  Sclerae nonicteric, noninjected.  Extraocular movements intact bilaterally. Neck:  Without mass or deformity.  Trachea is midline. Lungs:  Clear to auscultate bilaterally without rhonchi, wheeze, or rales. Heart:  Regular rate and rhythm.  Without murmurs, rubs, or gallops. Abdomen:  Soft, nontender, nondistended.  Without guarding or rebound. Extremities: Bilateral lower extremities without pitting edema, bandages clean dry intact  Data Reviewed: I have personally reviewed following labs and imaging studies  CBC: Recent Labs  Lab 05/15/23 1316 05/15/23 2305 05/16/23 0508 05/17/23 0557 05/19/23 0535  WBC 7.1  --  5.9 6.7 5.7  NEUTROABS 4.0  --   --   --   --   HGB 9.8* 10.2* 9.0* 10.4* 10.3*  HCT 33.6* 34.6* 30.1* 35.5* 33.7*  MCV 87.7  --  86.7 87.9 84.3  PLT 278  --  236 288 269   Basic Metabolic Panel: Recent Labs  Lab 05/15/23 1316 05/16/23 0508 05/17/23 0557 05/19/23 0535  NA 138 138 141 140  K 4.2 4.1 4.1 3.7  CL 105 110 106 106  CO2 21* 20* 24 26  GLUCOSE 75 71 80 83  BUN 10 9 8  5*  CREATININE 0.93 0.81 0.75 0.65  CALCIUM 8.7* 8.3* 8.7* 8.8*   GFR: Estimated Creatinine Clearance: 97 mL/min (by C-G formula based on SCr of 0.65 mg/dL). Liver Function Tests: Recent Labs  Lab 05/15/23 1316  AST 21  ALT 16  ALKPHOS 59  BILITOT 0.5  PROT 6.9  ALBUMIN 3.0*   CBG: Recent Labs  Lab 05/19/23 0601 05/19/23 1033 05/19/23 1625 05/19/23 2045 05/20/23 0608  GLUCAP 81 121* 117*  106* 127*   Anemia Panel: No results for input(s): "VITAMINB12", "FOLATE", "FERRITIN", "TIBC", "IRON", "RETICCTPCT" in the last 72 hours.  Sepsis Labs: Recent Labs  Lab 05/15/23 1330 05/15/23 1546  LATICACIDVEN 0.9 1.2    Recent Results (from the past 240 hours)  Blood Cultures x 2 sites     Status: None (Preliminary result)   Collection Time: 05/15/23  3:35 PM   Specimen: BLOOD  Result Value Ref Range Status   Specimen Description BLOOD RIGHT ANTECUBITAL  Final   Special Requests   Final    BOTTLES DRAWN AEROBIC AND ANAEROBIC Blood Culture adequate volume   Culture   Final    NO GROWTH 4 DAYS Performed at [facility], [address] [place], [place] [address]    Report Status PENDING  Incomplete  Blood Cultures x 2 sites     Status: None (Preliminary result)   Collection Time: 05/15/23  3:42 PM   Specimen: BLOOD LEFT HAND  Result Value Ref Range Status   Specimen Description BLOOD LEFT HAND  Final   Special Requests   Final    BOTTLES DRAWN AEROBIC ONLY Blood Culture adequate volume  Culture   Final    NO GROWTH 4 DAYS Performed at [facility], [address], [place], [place] [address]    Report Status PENDING  Incomplete  Surgical PCR screen     Status: None   Collection Time: 05/15/23  5:30 PM   Specimen: Nasal Mucosa; Nasal Swab  Result Value Ref Range Status   MRSA, PCR NEGATIVE NEGATIVE Final   Staphylococcus aureus NEGATIVE NEGATIVE Final    Comment: (NOTE) The Xpert SA Assay (FDA approved for NASAL specimens in patients 38 years of age and older), is one component of a comprehensive surveillance program. It is not intended to diagnose infection nor to guide or monitor treatment. Performed at [facility], [address] [place], [place] [address]          Radiology Studies: No results found.       Scheduled Meds:  acetaminophen  1,000 mg Oral Q8H   Or   acetaminophen  650 mg Rectal Q8H   atorvastatin  40 mg Oral QHS    buPROPion  300 mg Oral q morning   enoxaparin (LOVENOX) injection  50 mg Subcutaneous Daily   FLUoxetine  20 mg Oral Daily   gabapentin  600 mg Oral QHS   insulin aspart  0-15 Units Subcutaneous TID WC   insulin glargine-yfgn  12 Units Subcutaneous Q2200   methimazole  5 mg Oral Once per day on Monday Tuesday Wednesday Thursday Friday Saturday   metroNIDAZOLE  500 mg Oral Q12H   morphine  15 mg Oral Q12H   mupirocin ointment  1 Application Nasal BID   nicotine  14 mg Transdermal Daily   pantoprazole  40 mg Oral Daily   Continuous Infusions:  cefTRIAXone (ROCEPHIN)  IV 2 g (05/19/23 1332)   vancomycin 1,500 mg (05/19/23 1655)     LOS: 5 days   Time spent:  Azucena Fallen, DO [facility]  If 7PM-7AM, please contact night-coverage [email or web address]  05/20/2023, 7:23 AM

## 2023-05-21 ENCOUNTER — Encounter (HOSPITAL_COMMUNITY)
Admission: EM | Disposition: A | Discharge status: Home / Self Care | Payer: Self-pay | Source: Ambulatory Visit | Attending: Internal Medicine

## 2023-05-21 DIAGNOSIS — I70245 Atherosclerosis of native arteries of left leg with ulceration of other part of foot: Secondary | ICD-10-CM

## 2023-05-21 DIAGNOSIS — I70235 Atherosclerosis of native arteries of right leg with ulceration of other part of foot: Secondary | ICD-10-CM | POA: Diagnosis not present

## 2023-05-21 DIAGNOSIS — M869 Osteomyelitis, unspecified: Secondary | ICD-10-CM | POA: Diagnosis not present

## 2023-05-21 HISTORY — PX: ABDOMINAL AORTOGRAM W/LOWER EXTREMITY: CATH118223

## 2023-05-21 LAB — CBC
HCT: 40.3 % (ref 36.0–46.0)
Hemoglobin: 11.9 g/dL — ABNORMAL LOW (ref 12.0–15.0)
MCH: 25.3 pg — ABNORMAL LOW (ref 26.0–34.0)
MCHC: 29.5 g/dL — ABNORMAL LOW (ref 30.0–36.0)
MCV: 85.6 fL (ref 80.0–100.0)
Platelets: 394 10*3/uL (ref 150–400)
RBC: 4.71 MIL/uL (ref 3.87–5.11)
RDW: 19.5 % — ABNORMAL HIGH (ref 11.5–15.5)
WBC: 7.3 10*3/uL (ref 4.0–10.5)
nRBC: 0 % (ref 0.0–0.2)

## 2023-05-21 LAB — BASIC METABOLIC PANEL WITH GFR
Anion gap: 13 (ref 5–15)
BUN: 5 mg/dL — ABNORMAL LOW (ref 6–20)
CO2: 22 mmol/L (ref 22–32)
Calcium: 9.4 mg/dL (ref 8.9–10.3)
Chloride: 106 mmol/L (ref 98–111)
Creatinine, Ser: 0.69 mg/dL (ref 0.44–1.00)
GFR, Estimated: >60 mL/min (ref >60)
Glucose, Bld: 84 mg/dL (ref 70–99)
Potassium: 4 mmol/L (ref 3.5–5.1)
Sodium: 141 mmol/L (ref 135–145)

## 2023-05-21 LAB — GLUCOSE, CAPILLARY
Glucose-Capillary: 128 mg/dL — ABNORMAL HIGH (ref 70–99)
Glucose-Capillary: 95 mg/dL (ref 70–99)
Glucose-Capillary: 246 mg/dL — ABNORMAL HIGH (ref 70–99)

## 2023-05-21 SURGERY — ABDOMINAL AORTOGRAM W/LOWER EXTREMITY
Anesthesia: LOCAL | Laterality: Bilateral

## 2023-05-21 MED ORDER — IODIXANOL 320 MG/ML IV SOLN
INTRAVENOUS | Status: DC | PRN
  Administered 2023-05-21: 60 mL

## 2023-05-21 MED ORDER — LIDOCAINE HCL (PF) 1 % IJ SOLN
INTRAMUSCULAR | Status: AC
  Filled 2023-05-21: qty 30

## 2023-05-21 MED ORDER — HYDRALAZINE HCL 20 MG/ML IJ SOLN
5.0000 mg | INTRAMUSCULAR | Status: AC | PRN
  Administered 2023-05-21 (×2): 5 mg via INTRAVENOUS

## 2023-05-21 MED ORDER — LABETALOL HCL 5 MG/ML IV SOLN: 10.0000 mg | INTRAVENOUS | Status: DC | PRN

## 2023-05-21 MED ORDER — HYDRALAZINE HCL 20 MG/ML IJ SOLN
INTRAMUSCULAR | Status: AC
Start: 2023-05-21 — End: 2023-05-21
  Filled 2023-05-21: qty 1

## 2023-05-21 MED ORDER — HYDROMORPHONE HCL 1 MG/ML IJ SOLN
INTRAMUSCULAR | Status: AC
  Filled 2023-05-21: qty 0.5

## 2023-05-21 MED ORDER — MIDAZOLAM HCL 2 MG/2ML IJ SOLN
INTRAMUSCULAR | Status: DC | PRN
  Administered 2023-05-21: 1 mg via INTRAVENOUS

## 2023-05-21 MED ORDER — LABETALOL HCL 5 MG/ML IV SOLN
INTRAVENOUS | Status: AC
  Filled 2023-05-21: qty 4

## 2023-05-21 MED ORDER — SODIUM CHLORIDE 0.9% FLUSH
3.0000 mL | Freq: Two times a day (BID) | INTRAVENOUS | Status: DC
  Administered 2023-05-22 – 2023-05-24 (×4): 3 mL via INTRAVENOUS

## 2023-05-21 MED ORDER — MIDAZOLAM HCL 2 MG/2ML IJ SOLN
INTRAMUSCULAR | Status: AC
  Filled 2023-05-21: qty 2

## 2023-05-21 MED ORDER — SODIUM CHLORIDE 0.9 % IV SOLN: 250.0000 mL | INTRAVENOUS | Status: AC | PRN

## 2023-05-21 MED ORDER — SODIUM CHLORIDE 0.9 % WEIGHT BASED INFUSION
1.0000 mL/kg/h | INTRAVENOUS | Status: AC
  Administered 2023-05-21: 1 mL/kg/h via INTRAVENOUS

## 2023-05-21 MED ORDER — ACETAMINOPHEN 325 MG PO TABS: 650.0000 mg | ORAL_TABLET | ORAL | Status: DC | PRN

## 2023-05-21 MED ORDER — SODIUM CHLORIDE 0.9% FLUSH: 3.0000 mL | INTRAVENOUS | Status: DC | PRN

## 2023-05-21 MED ORDER — HYDROMORPHONE HCL 1 MG/ML IJ SOLN
0.5000 mg | Freq: Once | INTRAMUSCULAR | Status: AC
  Administered 2023-05-21: 0.5 mg via INTRAVENOUS

## 2023-05-21 MED ORDER — LINACLOTIDE 145 MCG PO CAPS
145.0000 ug | ORAL_CAPSULE | Freq: Every day | ORAL | Status: DC
  Administered 2023-05-22 – 2023-05-24 (×3): 145 ug via ORAL
  Filled 2023-05-21 (×4): qty 1

## 2023-05-21 MED ORDER — AMLODIPINE BESYLATE 10 MG PO TABS
10.0000 mg | ORAL_TABLET | Freq: Every day | ORAL | Status: DC
  Administered 2023-05-21 – 2023-05-23 (×3): 10 mg via ORAL
  Filled 2023-05-21 (×3): qty 1

## 2023-05-21 MED ORDER — LABETALOL HCL 5 MG/ML IV SOLN
INTRAVENOUS | Status: DC | PRN
  Administered 2023-05-21: 10 mg via INTRAVENOUS

## 2023-05-21 MED ORDER — IRBESARTAN 75 MG PO TABS
37.5000 mg | ORAL_TABLET | Freq: Every day | ORAL | Status: DC
  Administered 2023-05-22 – 2023-05-24 (×3): 37.5 mg via ORAL
  Filled 2023-05-21: qty 1
  Filled 2023-05-21 (×3): qty 0.5

## 2023-05-21 MED ORDER — HEPARIN (PORCINE) IN NACL 1000-0.9 UT/500ML-% IV SOLN
INTRAVENOUS | Status: DC | PRN
  Administered 2023-05-21 (×2): 500 mL

## 2023-05-21 MED ORDER — FENTANYL CITRATE (PF) 100 MCG/2ML IJ SOLN
INTRAMUSCULAR | Status: DC | PRN
  Administered 2023-05-21: 50 ug via INTRAVENOUS

## 2023-05-21 MED ORDER — ONDANSETRON HCL 4 MG/2ML IJ SOLN
INTRAMUSCULAR | Status: AC
  Filled 2023-05-21: qty 2

## 2023-05-21 MED ORDER — FENTANYL CITRATE (PF) 100 MCG/2ML IJ SOLN
INTRAMUSCULAR | Status: AC
  Filled 2023-05-21: qty 2

## 2023-05-21 MED ORDER — LIDOCAINE HCL (PF) 1 % IJ SOLN
INTRAMUSCULAR | Status: DC | PRN
  Administered 2023-05-21: 15 mL via INTRADERMAL

## 2023-05-21 SURGICAL SUPPLY — 6 items
CATH OMNI FLUSH 5F 65CM (CATHETERS) IMPLANT
KIT MICROPUNCTURE NIT STIFF (SHEATH) IMPLANT
SET ATX-X65L (MISCELLANEOUS) IMPLANT
SHEATH PINNACLE 5F 10CM (SHEATH) IMPLANT
TRAY PV CATH (CUSTOM PROCEDURE TRAY) IMPLANT
WIRE BENTSON .035X145CM (WIRE) IMPLANT

## 2023-05-21 NOTE — Progress Notes (Addendum)
  Progress Note    05/21/2023 8:04 AM * Surgery not performed *   Bilateral food wounds Scheduled for Aortogram, Arteriogram of BLE with possible intervention on RLE in cath lab today with Dr. Theresa Mulligan Reviewed procedure with patient today. She did not have any questions Keep NPO Consent ordered    Dory Horn [facility] [phone] 05/21/2023 8:04 AM  VASCULAR STAFF ADDENDUM: I have independently interviewed and examined the patient. I agree with the above.   Daria Pastures MD [facility] Phone Number: [phone] 05/21/2023 9:11 AM

## 2023-05-21 NOTE — Op Note (Signed)
    Patient name: Carmen Armstrong MRN: [id number] DOB: [birth year] Sex: female  05/15/2023 - 05/21/2023 Pre-operative Diagnosis: Bilateral toe wounds Post-operative diagnosis:  Same Surgeon:  Daria Pastures, MD Procedure Performed:  Ultrasound-guided access of left common femoral artery Aortogram and bilateral lower extremity angiogram Second-order cannulation of right external iliac artery 12 minutes moderate sedation with fentanyl and Versed  Indications: [name] is a 57 year old female with bilateral toe wounds.  She is a diabetic and has a history of extremity pain.  Noninvasive vascular labs demonstrated arterial insufficiency.  Risks and benefits of angiogram were reviewed, she expressed understanding and elected to proceed.  Findings:  Widely patent aorta and bilateral renal arteries.  Widely patent iliac systems bilaterally  Right common femoral artery, profunda and SFA widely patent.  Patent popliteal artery with minimal stenosis.  Two-vessel runoff via the peroneal and PT.  Right AT diminutive proximally and occludes in its mid segment.  The DP is reconstituted by peroneal collaterals.  Inline flow to the foot via the PT and peroneal  Left common femoral artery, profunda and SFA widely patent.  Popliteal artery widely patent.  Three-vessel runoff.  No flow-limiting stenosis.   Procedure:  The patient was identified in the holding area and taken to the cath lab  The patient was then placed supine on the table and prepped and draped in the usual sterile fashion.  A time out was called.  Ultrasound was used to evaluate the left common femoral artery.  It was patent .  A digital ultrasound image was acquired.  A micropuncture needle was used to access the left common femoral artery under ultrasound guidance.  An 018 wire was advanced without resistance and a micropuncture sheath was placed.  The 018 wire was removed and a benson wire was placed.  The micropuncture sheath was exchanged  for a 5 french sheath.  An omniflush catheter was advanced over the wire to the level of L-1.  An abdominal angiogram was obtained.  Next, using the omniflush catheter and a Bentson wire, the aortic bifurcation was crossed and the catheter was placed into theright external iliac artery and right runoff was obtained. This demonstrated the above findings.  right runoff was performed via retrograde sheath injections which demonstrated the above findings.  Contrast: 60 cc Sedation: 12 minutes  Impression: On the right, inline flow to the foot via the PT and peroneal, no flow-limiting stenosis On the left, inline flow with three-vessel runoff, no flow-limiting stenosis   Daria Pastures MD [facility] [place] Office: [phone]

## 2023-05-21 NOTE — Progress Notes (Signed)
 PROGRESS NOTE    Carmen Armstrong  JOA:[id number] DOB: 24-Nov-1965 DOA: 05/15/2023 PCP: Lewis Moccasin, MD   Brief Narrative:  Carmen Armstrong is a 58 y.o. female with medical history significant of poorly controlled T2DM with polyneuropathy, atrial fibrillation on eliquis, HTN, chronic diastolic HF, CAD, HLD, PAD, hypothyroidism, depression, anxiety presenting to the ED with worsening right great toe and left second toe wound with worsening and uncontrolled pain with notable swelling and drainage.  She was prescribed antibiotics in the outpatient setting previously but has not been compliant with these.  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 -Patient does not meet sepsis criteria -Podiatry consulted, appreciate insight recommendations -plan for right hallux and left second toe amputation, 05/18/23  -Continue ceftriaxone, Flagyl, vancomycin -continue to follow cultures -MRI unable to be performed due to patient's spinal stimulator -CT bilateral lower extremity confirms osteomyelitis -Bilateral US-ABI shows RLE mild arterial disease; LLE WNL- Podiatry requesting formal vascular consult -appreciate insight and recommendations Patient has planned angiogram with vascular surgery today, pending findings plan for tentative surgical evaluation with podiatry Tuesday 05/22/23  Normocytic, iron deficiency, anemia -Questionably acute on chronic although unknown baseline, recent labs with hemoglobin around 12 -Possibly dilutional, FOBT negative, no signs or symptoms of bleeding -Continue to follow closely given patient continues on home Eliquis -Iron studies confirm low iron, discussed appropriate  diet supplementation   Uncontrolled T2DM with polyneuropathy -A1c 13.1 just last month -Discussed diabetic diet, medication compliance -Continue sliding scale insulin, 25 units at bedtime glargine -Home regimen includes 64 units Tresiba, 3 times daily sliding scale, dapagliflozin/metformin/tirzepatide -Continue gabapentin/fluoxetine   Hypotension History of essential HTN -Hold home metoprolol, spironolactone, Imdur, candesartan, amlodipine  Parosymal Atrial fibrillation -Continue Eliquis -Holding metoprolol given above  Chronic diastolic HF -Echo last year with EF 65%, grade 1 diastolic dysfunction -Holding core measures given above -Follow I's and O's, daily weights -Not on diuretics at baseline   CAD PVD HLD -Continue home Eliquis, atorvastatin -Lower extremity ABIs pending  Hyperthyroidism -Continue methimazole   Depression and anxiety -Continue fluoxetine, bupropion   Chronic pain Continue MS Contin 15 mg twice daily per PDMP  DVT prophylaxis:  Code Status:   Code Status: Full Code Family Communication: None present  Status is: Inpatient  Dispo: The patient is from: Home              Anticipated d/c is to: To be determined              Anticipated d/c date is: 72+ hours              Patient currently not medically stable for discharge  Consultants:  Orthopedic surgery  Procedures:  Pending above  Antimicrobials:  Vancomycin, Flagyl, ceftriaxone  Subjective: No acute issues or events overnight, pain currently well-controlled denies nausea vomiting diarrhea constipation headache fevers chills or chest pain, patient continues to voice concern that despite "her best efforts she continues to have infections" -we discussed that she remains high risk for infection due to her other chronic comorbid conditions.  Objective: Vitals:   05/20/23 0851 05/20/23 1752 05/20/23 2019 05/21/23 0455  BP: (!) 171/79 (!) 181/81 (!) 175/75 (!) 148/71  Pulse: 70 73 83 75   Resp: 18 19 18 17   Temp: 97.7 F (36.5 C) 98.3 F (36.8 C) 98.1 F (36.7 C) 98.1 F (36.7 C)  TempSrc: Oral Oral Oral Oral  SpO2: 99% 99% 98% 96%  Weight:      Height:        Intake/Output Summary (Last 24 hours) at 05/21/2023 0742 Last data filed at 05/20/2023 1800 Gross per 24 hour  Intake 960 ml  Output --  Net 960 ml    Filed Weights   05/16/23 0500 05/17/23 0317 05/19/23 0500  Weight: 102.3 kg 101.9 kg 101.1 kg    Examination:  General:  Pleasantly resting in bed, No acute distress. HEENT:  Normocephalic atraumatic.  Sclerae nonicteric, noninjected.  Extraocular movements intact bilaterally. Neck:  Without mass or deformity.  Trachea is midline. Lungs:  Clear to auscultate bilaterally without rhonchi, wheeze, or rales. Heart:  Regular rate and rhythm.  Without murmurs, rubs, or gallops. Abdomen:  Soft, nontender, nondistended.  Without guarding or rebound. Extremities: Bilateral lower extremities without pitting edema, bandages clean dry intact  Data Reviewed: I have personally reviewed following labs and imaging studies  CBC: Recent Labs  Lab 05/15/23 1316 05/15/23 2305 05/16/23 0508 05/17/23 0557 05/19/23 0535 05/21/23 0513  WBC 7.1  --  5.9 6.7 5.7 7.3  NEUTROABS 4.0  --   --   --   --   --   HGB 9.8* 10.2* 9.0* 10.4* 10.3* 11.9*  HCT 33.6* 34.6* 30.1* 35.5* 33.7* 40.3  MCV 87.7  --  86.7 87.9 84.3 85.6  PLT 278  --  236 288 269 394   Basic Metabolic Panel: Recent Labs  Lab 05/15/23 1316 05/16/23 0508 05/17/23 0557 05/19/23 0535 05/21/23 0513  NA 138 138 141 140 141  K 4.2 4.1 4.1 3.7 4.0  CL 105 110 106 106 106  CO2 21* 20* 24 26 22   GLUCOSE 75 71 80 83 84  BUN 10 9 8  5* 5*  CREATININE 0.93 0.81 0.75 0.65 0.69  CALCIUM 8.7* 8.3* 8.7* 8.8* 9.4   GFR: Estimated Creatinine Clearance: 97 mL/min (by C-G formula based on SCr of 0.69 mg/dL). Liver Function Tests: Recent Labs  Lab 05/15/23 1316  AST 21  ALT 16  ALKPHOS 59  BILITOT 0.5   PROT 6.9  ALBUMIN 3.0*   CBG: Recent Labs  Lab 05/20/23 0854 05/20/23 1223 05/20/23 1744 05/20/23 2148 05/21/23 0656  GLUCAP 101* 132* 95 169* 128*   Anemia Panel: No results for input(s): "VITAMINB12", "FOLATE", "FERRITIN", "TIBC", "IRON", "RETICCTPCT" in the last 72 hours.  Sepsis Labs: Recent Labs  Lab 05/15/23 1330 05/15/23 1546  LATICACIDVEN 0.9 1.2    Recent Results (from the past 240 hours)  Blood Cultures x 2 sites     Status: None   Collection Time: 05/15/23  3:35 PM   Specimen: BLOOD  Result Value Ref Range Status   Specimen Description BLOOD RIGHT ANTECUBITAL  Final   Special Requests   Final    BOTTLES DRAWN AEROBIC AND ANAEROBIC Blood Culture adequate volume   Culture   Final    NO GROWTH 5 DAYS Performed at [facility], [address], [place], [place] [address]    Report Status 05/20/2023 FINAL  Final  Blood Cultures x 2 sites     Status: None   Collection Time: 05/15/23  3:42 PM   Specimen: BLOOD LEFT HAND  Result Value Ref Range Status   Specimen Description BLOOD  LEFT HAND  Final   Special Requests   Final    BOTTLES DRAWN AEROBIC ONLY Blood Culture adequate volume   Culture   Final    NO GROWTH 5 DAYS Performed at [facility], [address] [place], [place] [address]    Report Status 05/20/2023 FINAL  Final  Surgical PCR screen     Status: None   Collection Time: 05/15/23  5:30 PM   Specimen: Nasal Mucosa; Nasal Swab  Result Value Ref Range Status   MRSA, PCR NEGATIVE NEGATIVE Final   Staphylococcus aureus NEGATIVE NEGATIVE Final    Comment: (NOTE) The Xpert SA Assay (FDA approved for NASAL specimens in patients 35 years of age and older), is one component of a comprehensive surveillance program. It is not intended to diagnose infection nor to guide or monitor treatment. Performed at [facility], [address] [place], [place] [address]          Radiology Studies: No results  found.       Scheduled Meds:  acetaminophen  1,000 mg Oral Q8H   Or   acetaminophen  650 mg Rectal Q8H   amLODipine  10 mg Oral QHS   atorvastatin  40 mg Oral QHS   buPROPion  300 mg Oral q morning   enoxaparin (LOVENOX) injection  50 mg Subcutaneous Daily   FLUoxetine  20 mg Oral Daily   gabapentin  600 mg Oral QHS   insulin aspart  0-15 Units Subcutaneous TID WC   insulin glargine-yfgn  12 Units Subcutaneous Q2200   irbesartan  37.5 mg Oral Daily   linaclotide  145 mcg Oral Daily   methimazole  5 mg Oral Once per day on Monday Tuesday Wednesday Thursday Friday Saturday   metroNIDAZOLE  500 mg Oral Q12H   morphine  15 mg Oral Q12H   nicotine  14 mg Transdermal Daily   pantoprazole  40 mg Oral Daily   Continuous Infusions:  cefTRIAXone (ROCEPHIN)  IV 2 g (05/20/23 1325)   vancomycin 1,000 mg (05/21/23 0627)     LOS: 6 days   Time spent:  Azucena Fallen, DO [facility]  If 7PM-7AM, please contact night-coverage [email or web address]  05/21/2023, 7:42 AM

## 2023-05-21 NOTE — Plan of Care (Signed)
 N.p.o. past midnight for OR tomorrow morning.  Plan for right hallux partial amputation and left second toe partial amputation.         Corinna Gab, DPM [facility]                   05/21/2023

## 2023-05-21 NOTE — Progress Notes (Signed)
 Site area: L Groin Site Prior to Removal:  Level 0 Pressure Applied For: 25 min Manual:   yes Patient Status During Pull:  Stable Post Pull Site:  Level 0 Post Pull Instructions Given:  Yes Post Pull Pulses Present: yes, bilat DP +1 Dressing Applied:  gauze & tegaderm Bedrest begins @ 1105 Comments: During manual pressure, pt reports 8/10 pain at site.  No bleeding or hematoma noted, area soft.  MD notified, order for pain medication received.  After administration, patient became notably less distressed.  After manual pressure finished, patient reports 1/10 pain.  Site appears well, no bleeding or oozing, soft on palpation.  Pt verbalized understanding of post pull instruction, no questions at this time.  Will continue to monitor.

## 2023-05-22 ENCOUNTER — Inpatient Hospital Stay (HOSPITAL_COMMUNITY): Payer: Self-pay | Admitting: Anesthesiology

## 2023-05-22 ENCOUNTER — Inpatient Hospital Stay (HOSPITAL_COMMUNITY)

## 2023-05-22 ENCOUNTER — Encounter (HOSPITAL_COMMUNITY)
Admission: EM | Disposition: A | Discharge status: Home / Self Care | Payer: Self-pay | Source: Ambulatory Visit | Attending: Internal Medicine

## 2023-05-22 ENCOUNTER — Encounter (HOSPITAL_COMMUNITY): Payer: Self-pay | Admitting: Student

## 2023-05-22 DIAGNOSIS — M869 Osteomyelitis, unspecified: Secondary | ICD-10-CM

## 2023-05-22 DIAGNOSIS — I2510 Atherosclerotic heart disease of native coronary artery without angina pectoris: Secondary | ICD-10-CM | POA: Diagnosis not present

## 2023-05-22 DIAGNOSIS — F17210 Nicotine dependence, cigarettes, uncomplicated: Secondary | ICD-10-CM | POA: Diagnosis not present

## 2023-05-22 DIAGNOSIS — E1169 Type 2 diabetes mellitus with other specified complication: Secondary | ICD-10-CM | POA: Diagnosis not present

## 2023-05-22 HISTORY — PX: AMPUTATION TOE: SHX6595

## 2023-05-22 HISTORY — PX: AMPUTATION: SHX166

## 2023-05-22 LAB — GLUCOSE, CAPILLARY
Glucose-Capillary: 162 mg/dL — ABNORMAL HIGH (ref 70–99)
Glucose-Capillary: 165 mg/dL — ABNORMAL HIGH (ref 70–99)
Glucose-Capillary: 160 mg/dL — ABNORMAL HIGH (ref 70–99)
Glucose-Capillary: 195 mg/dL — ABNORMAL HIGH (ref 70–99)
Glucose-Capillary: 144 mg/dL — ABNORMAL HIGH (ref 70–99)

## 2023-05-22 LAB — LIPID PANEL
Cholesterol: 68 mg/dL (ref 0–200)
HDL: 28 mg/dL — ABNORMAL LOW (ref >40)
LDL Cholesterol: 26 mg/dL (ref 0–99)
Total CHOL/HDL Ratio: 2.4 ratio
Triglycerides: 68 mg/dL (ref <150)
VLDL: 14 mg/dL (ref 0–40)

## 2023-05-22 LAB — CBC
HCT: 35.6 % — ABNORMAL LOW (ref 36.0–46.0)
Hemoglobin: 10.7 g/dL — ABNORMAL LOW (ref 12.0–15.0)
MCH: 25.1 pg — ABNORMAL LOW (ref 26.0–34.0)
MCHC: 30.1 g/dL (ref 30.0–36.0)
MCV: 83.6 fL (ref 80.0–100.0)
Platelets: 391 10*3/uL (ref 150–400)
RBC: 4.26 MIL/uL (ref 3.87–5.11)
RDW: 19.5 % — ABNORMAL HIGH (ref 11.5–15.5)
WBC: 6 10*3/uL (ref 4.0–10.5)
nRBC: 0 % (ref 0.0–0.2)

## 2023-05-22 LAB — BASIC METABOLIC PANEL WITH GFR
Anion gap: 10 (ref 5–15)
BUN: 6 mg/dL (ref 6–20)
CO2: 26 mmol/L (ref 22–32)
Calcium: 9.1 mg/dL (ref 8.9–10.3)
Chloride: 103 mmol/L (ref 98–111)
Creatinine, Ser: 0.75 mg/dL (ref 0.44–1.00)
GFR, Estimated: >60 mL/min (ref >60)
Glucose, Bld: 129 mg/dL — ABNORMAL HIGH (ref 70–99)
Potassium: 3.1 mmol/L — ABNORMAL LOW (ref 3.5–5.1)
Sodium: 139 mmol/L (ref 135–145)

## 2023-05-22 SURGERY — AMPUTATION, TOE
Anesthesia: Monitor Anesthesia Care | Site: Toe | Laterality: Right

## 2023-05-22 MED ORDER — ORAL CARE MOUTH RINSE: 15.0000 mL | Freq: Once | OROMUCOSAL | Status: AC

## 2023-05-22 MED ORDER — LIDOCAINE HCL (PF) 1 % IJ SOLN
INTRAMUSCULAR | Status: DC | PRN
  Administered 2023-05-22: 5 mL

## 2023-05-22 MED ORDER — CHLORHEXIDINE GLUCONATE 0.12 % MT SOLN
15.0000 mL | Freq: Once | OROMUCOSAL | Status: AC
  Administered 2023-05-22: 15 mL via OROMUCOSAL

## 2023-05-22 MED ORDER — VANCOMYCIN HCL 500 MG IV SOLR
INTRAVENOUS | Status: AC
  Filled 2023-05-22: qty 10

## 2023-05-22 MED ORDER — AMOXICILLIN-POT CLAVULANATE 875-125 MG PO TABS
1.0000 | ORAL_TABLET | Freq: Two times a day (BID) | ORAL | Status: DC
  Administered 2023-05-22 – 2023-05-24 (×5): 1 via ORAL
  Filled 2023-05-22 (×6): qty 1

## 2023-05-22 MED ORDER — FENTANYL CITRATE (PF) 100 MCG/2ML IJ SOLN: 25.0000 ug | INTRAMUSCULAR | Status: DC | PRN

## 2023-05-22 MED ORDER — MIDAZOLAM HCL 2 MG/2ML IJ SOLN
INTRAMUSCULAR | Status: AC
  Filled 2023-05-22: qty 2

## 2023-05-22 MED ORDER — BUPIVACAINE HCL (PF) 0.5 % IJ SOLN
INTRAMUSCULAR | Status: AC
  Filled 2023-05-22: qty 30

## 2023-05-22 MED ORDER — 0.9 % SODIUM CHLORIDE (POUR BTL) OPTIME
TOPICAL | Status: DC | PRN
  Administered 2023-05-22: 1000 mL

## 2023-05-22 MED ORDER — ONDANSETRON HCL 4 MG/2ML IJ SOLN
INTRAMUSCULAR | Status: AC
  Filled 2023-05-22: qty 2

## 2023-05-22 MED ORDER — PROPOFOL 10 MG/ML IV BOLUS
INTRAVENOUS | Status: AC
  Filled 2023-05-22: qty 20

## 2023-05-22 MED ORDER — APIXABAN 5 MG PO TABS
5.0000 mg | ORAL_TABLET | Freq: Two times a day (BID) | ORAL | Status: DC
  Administered 2023-05-22: 5 mg via ORAL
  Filled 2023-05-22 (×2): qty 1

## 2023-05-22 MED ORDER — OXYCODONE HCL 5 MG PO TABS
2.5000 mg | ORAL_TABLET | ORAL | Status: DC | PRN
  Administered 2023-05-22 – 2023-05-23 (×6): 5 mg via ORAL
  Filled 2023-05-22 (×6): qty 1

## 2023-05-22 MED ORDER — TOBRAMYCIN SULFATE 80 MG/2ML IJ SOLN
INTRAMUSCULAR | Status: AC
Start: 2023-05-22 — End: ?
  Filled 2023-05-22: qty 4

## 2023-05-22 MED ORDER — FENTANYL CITRATE (PF) 250 MCG/5ML IJ SOLN
INTRAMUSCULAR | Status: AC
  Filled 2023-05-22: qty 5

## 2023-05-22 MED ORDER — SENNOSIDES-DOCUSATE SODIUM 8.6-50 MG PO TABS
1.0000 | ORAL_TABLET | Freq: Two times a day (BID) | ORAL | Status: DC
  Administered 2023-05-22 – 2023-05-23 (×3): 1 via ORAL
  Filled 2023-05-22 (×3): qty 1

## 2023-05-22 MED ORDER — PROPOFOL 500 MG/50ML IV EMUL
INTRAVENOUS | Status: DC | PRN
Start: 2023-05-22 — End: 2023-05-22
  Administered 2023-05-22 (×2): 20 mg via INTRAVENOUS
  Administered 2023-05-22: 75 ug/kg/min via INTRAVENOUS

## 2023-05-22 MED ORDER — MIDAZOLAM HCL 2 MG/2ML IJ SOLN
INTRAMUSCULAR | Status: DC | PRN
  Administered 2023-05-22: 2 mg via INTRAVENOUS

## 2023-05-22 MED ORDER — LIDOCAINE HCL (PF) 1 % IJ SOLN
INTRAMUSCULAR | Status: AC
  Filled 2023-05-22: qty 30

## 2023-05-22 MED ORDER — LIDOCAINE 2% (20 MG/ML) 5 ML SYRINGE
INTRAMUSCULAR | Status: DC | PRN
  Administered 2023-05-22: 40 mg via INTRAVENOUS

## 2023-05-22 MED ORDER — FENTANYL CITRATE (PF) 100 MCG/2ML IJ SOLN
INTRAMUSCULAR | Status: DC | PRN
  Administered 2023-05-22 (×2): 50 ug via INTRAVENOUS

## 2023-05-22 MED ORDER — BUPIVACAINE HCL (PF) 0.5 % IJ SOLN
INTRAMUSCULAR | Status: DC | PRN
  Administered 2023-05-22: 5 mL

## 2023-05-22 MED ORDER — DROPERIDOL 2.5 MG/ML IJ SOLN: 0.6250 mg | Freq: Once | INTRAMUSCULAR | Status: DC | PRN

## 2023-05-22 MED ORDER — LIDOCAINE 2% (20 MG/ML) 5 ML SYRINGE
INTRAMUSCULAR | Status: AC
  Filled 2023-05-22: qty 5

## 2023-05-22 MED ORDER — POLYETHYLENE GLYCOL 3350 17 G PO PACK
17.0000 g | PACK | Freq: Two times a day (BID) | ORAL | Status: DC
  Administered 2023-05-22: 17 g via ORAL
  Filled 2023-05-22 (×3): qty 1

## 2023-05-22 MED ORDER — LACTATED RINGERS IV SOLN: INTRAVENOUS | Status: DC

## 2023-05-22 MED ORDER — POTASSIUM CHLORIDE CRYS ER 20 MEQ PO TBCR
40.0000 meq | EXTENDED_RELEASE_TABLET | ORAL | Status: AC
  Administered 2023-05-22 (×3): 40 meq via ORAL
  Filled 2023-05-22 (×3): qty 2

## 2023-05-22 SURGICAL SUPPLY — 39 items
BLADE AVERAGE 25X9 (BLADE) IMPLANT
BLADE SURG 10 STRL SS (BLADE) ×2 IMPLANT
BLADE SURG 15 STRL LF DISP TIS (BLADE) ×2 IMPLANT
BNDG COHESIVE 3X5 TAN ST LF (GAUZE/BANDAGES/DRESSINGS) ×2 IMPLANT
BNDG ELASTIC 3INX 5YD STR LF (GAUZE/BANDAGES/DRESSINGS) ×2 IMPLANT
BNDG ELASTIC 4INX 5YD STR LF (GAUZE/BANDAGES/DRESSINGS) IMPLANT
BNDG ESMARK 4X9 LF (GAUZE/BANDAGES/DRESSINGS) ×2 IMPLANT
BNDG GAUZE DERMACEA FLUFF 4 (GAUZE/BANDAGES/DRESSINGS) IMPLANT
CHLORAPREP W/TINT 26 (MISCELLANEOUS) IMPLANT
DRSG ADAPTIC 3X8 NADH LF (GAUZE/BANDAGES/DRESSINGS) IMPLANT
DRSG XEROFORM 1X8 (GAUZE/BANDAGES/DRESSINGS) IMPLANT
ELECT REM PT RETURN 9FT ADLT (ELECTROSURGICAL) ×2 IMPLANT
ELECTRODE REM PT RTRN 9FT ADLT (ELECTROSURGICAL) ×2 IMPLANT
GAUZE PAD ABD 8X10 STRL (GAUZE/BANDAGES/DRESSINGS) IMPLANT
GAUZE SPONGE 2X2 STRL 8-PLY (GAUZE/BANDAGES/DRESSINGS) IMPLANT
GAUZE SPONGE 4X4 12PLY STRL (GAUZE/BANDAGES/DRESSINGS) ×2 IMPLANT
GAUZE STRETCH 2X75IN STRL (MISCELLANEOUS) ×2 IMPLANT
GAUZE XEROFORM 1X8 LF (GAUZE/BANDAGES/DRESSINGS) ×2 IMPLANT
GLOVE BIO SURGEON STRL SZ7.5 (GLOVE) ×2 IMPLANT
GLOVE BIOGEL PI IND STRL 7.5 (GLOVE) ×2 IMPLANT
GOWN STRL REUS W/ TWL LRG LVL3 (GOWN DISPOSABLE) ×4 IMPLANT
KIT BASIN OR (CUSTOM PROCEDURE TRAY) ×2 IMPLANT
NDL BIOPSY JAMSHIDI 8X6 (NEEDLE) IMPLANT
NDL HYPO 25X1 1.5 SAFETY (NEEDLE) ×2 IMPLANT
NEEDLE BIOPSY JAMSHIDI 8X6 (NEEDLE) IMPLANT
NEEDLE HYPO 25X1 1.5 SAFETY (NEEDLE) ×2 IMPLANT
PACK ORTHO EXTREMITY (CUSTOM PROCEDURE TRAY) ×2 IMPLANT
PADDING CAST ABS COTTON 4X4 ST (CAST SUPPLIES) ×4 IMPLANT
SET HNDPC FAN SPRY TIP SCT (DISPOSABLE) IMPLANT
SPIKE FLUID TRANSFER (MISCELLANEOUS) IMPLANT
STOCKINETTE 4X48 STRL (DRAPES) IMPLANT
SUT ETHILON 3 0 FSLX (SUTURE) IMPLANT
SUT PROLENE 3 0 PS 2 (SUTURE) IMPLANT
SUT PROLENE 4 0 PS 2 18 (SUTURE) IMPLANT
SYR CONTROL 10ML LL (SYRINGE) ×2 IMPLANT
TUBE CONNECTING 12X1/4 (SUCTIONS) IMPLANT
UNDERPAD 30X36 HEAVY ABSORB (UNDERPADS AND DIAPERS) ×2 IMPLANT
WATER STERILE IRR 1000ML POUR (IV SOLUTION) ×2 IMPLANT
YANKAUER SUCT BULB TIP NO VENT (SUCTIONS) IMPLANT

## 2023-05-22 NOTE — Progress Notes (Signed)
 PHARMACIST LIPID MONITORING   Carmen Armstrong is a 58 y.o. female admitted on 05/15/2023 with diabetic toe wounds s/p angiogram 4/7 and amputation of both great and second toe 4/8.  Pharmacy has been consulted to optimize lipid-lowering therapy with the indication of secondary prevention for clinical ASCVD.  Recent Labs:  Lipid Panel (last 6 months):   Lab Results  Component Value Date   CHOL 68 05/22/2023   TRIG 68 05/22/2023   HDL 28 (L) 05/22/2023   CHOLHDL 2.4 05/22/2023   VLDL 14 05/22/2023   LDLCALC 26 05/22/2023    Hepatic function panel (last 6 months):   Lab Results  Component Value Date   AST 21 05/15/2023   ALT 16 05/15/2023   ALKPHOS 59 05/15/2023   BILITOT 0.5 05/15/2023    SCr (since admission):   Serum creatinine: 0.75 mg/dL 82/95/62 1308 Estimated creatinine clearance: 93.8 mL/min  Current therapy and lipid therapy tolerance Current lipid-lowering therapy: atorvastatin 40 mg daily Previous lipid-lowering therapies (if applicable): n/a Documented or reported allergies or intolerances to lipid-lowering therapies (if applicable): n/a  Assessment:   LDL 26 mg/dL at goal on PTA atorvastatin 40 mg daily.  No changes recommended.  Plan:    1.Statin intensity (high intensity recommended for all patients regardless of the LDL):  No statin changes. The patient is already on a high intensity statin.  2.Add ezetimibe (if any one of the following):   Not indicated at this time.  3.Refer to lipid clinic:   No  4.Follow-up with:  Primary care provider - Lewis Moccasin, MD  5.Follow-up labs after discharge:  No changes in lipid therapy, repeat a lipid panel in one year.      Trixie Rude, PharmD Clinical Pharmacist 05/22/2023  12:18 PM

## 2023-05-22 NOTE — Anesthesia Preprocedure Evaluation (Signed)
 Anesthesia Evaluation  Patient identified by MRN, date of birth, ID band Patient awake    Reviewed: Allergy & Precautions, NPO status , Patient's Chart, lab work & pertinent test results  Airway Mallampati: II  TM Distance: >3 FB Neck ROM: Full    Dental   Pulmonary Current Smoker and Patient abstained from smoking.   breath sounds clear to auscultation       Cardiovascular hypertension, Pt. on medications + CAD and + Peripheral Vascular Disease   Rhythm:Regular Rate:Normal     Neuro/Psych  Neuromuscular disease    GI/Hepatic Neg liver ROS,GERD  ,,  Endo/Other  diabetesHypothyroidism    Renal/GU negative Renal ROS     Musculoskeletal  (+) Arthritis ,    Abdominal   Peds  Hematology negative hematology ROS (+)   Anesthesia Other Findings   Reproductive/Obstetrics                             Anesthesia Physical Anesthesia Plan  ASA: 3  Anesthesia Plan: MAC   Post-op Pain Management: Tylenol PO (pre-op)* and Gabapentin PO (pre-op)*   Induction:   PONV Risk Score and Plan: 1 and Propofol infusion, Ondansetron and Treatment may vary due to age or medical condition  Airway Management Planned: Natural Airway and Simple Face Mask  Additional Equipment:   Intra-op Plan:   Post-operative Plan:   Informed Consent: I have reviewed the patients History and Physical, chart, labs and discussed the procedure including the risks, benefits and alternatives for the proposed anesthesia with the patient or authorized representative who has indicated his/her understanding and acceptance.       Plan Discussed with: CRNA  Anesthesia Plan Comments:        Anesthesia Quick Evaluation

## 2023-05-22 NOTE — Progress Notes (Signed)
 PROGRESS NOTE    Carmen Armstrong  ZOX:[id number] DOB: 1966-02-01 DOA: 05/15/2023 PCP: Carmen Moccasin, MD   Brief Narrative:  Carmen Armstrong is a 58 y.o. female with medical history significant of poorly controlled T2DM with polyneuropathy, atrial fibrillation on eliquis, HTN, chronic diastolic HF, CAD, HLD, PAD, hypothyroidism, depression, anxiety presenting to the ED with worsening right great toe and left second toe wound with worsening and uncontrolled pain with notable swelling and drainage.  She was prescribed antibiotics in the outpatient setting previously but has not been compliant with these.  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 -Podiatry to operate this morning, planned amputation of the right great toe and left second toe today 4/8 -Update**patient tolerated surgical procedure well this morning bandages appear clean dry intact, pain currently well-controlled, weightbearing and PT recommendations per orthopedics (WBAT in a post op shoe to the operative limb until further instructed)  Normocytic, iron deficiency,  anemia -Questionably acute on chronic although unknown baseline, recent labs with hemoglobin around 12 -Possibly dilutional, FOBT negative, no signs or symptoms of bleeding -Continue to follow closely given patient continues on home Eliquis -Iron studies confirm low iron, discussed appropriate diet supplementation   Uncontrolled T2DM with polyneuropathy -A1c 13.1 just last month -Discussed diabetic diet, medication compliance -Continue sliding scale insulin, 25 units at bedtime glargine -Home regimen includes 64 units Tresiba, 3 times daily sliding scale, dapagliflozin/metformin/tirzepatide -Continue gabapentin/fluoxetine   Hypotension History of essential HTN -Hold home metoprolol, spironolactone, Imdur, candesartan, amlodipine  Parosymal Atrial fibrillation -Continue Eliquis -Holding metoprolol given above  Chronic diastolic HF -Echo last year with EF 65%, grade 1 diastolic dysfunction -Holding core measures given above -Follow I's and O's, daily weights -Not on diuretics at baseline   CAD PVD HLD -Continue home Eliquis, atorvastatin -Lower extremity ABIs pending  Hyperthyroidism -Continue methimazole   Depression and anxiety -Continue fluoxetine, bupropion   Chronic pain Continue MS Contin 15 mg twice daily per PDMP  DVT prophylaxis: SCD's Start: 05/21/23 1625 Code Status:   Code Status: Full Code Family Communication: None present  Status is: Inpatient  Dispo: The patient is from: Home              Anticipated d/c is to: To be determined              Anticipated d/c date is: 72+ hours              Patient currently not medically stable for discharge  Consultants:  Orthopedic surgery  Procedures:  Pending above  Antimicrobials:  Vancomycin, Flagyl, ceftriaxone  Subjective: No acute issues or events overnight, pain currently well-controlled denies nausea vomiting diarrhea constipation headache fevers chills or chest pain, patient continues to voice  concern that  despite "her best efforts she continues to have infections" -we discussed that she remains high risk for infection due to her other chronic comorbid conditions.  Objective: Vitals:   05/21/23 1530 05/21/23 1627 05/21/23 1956 05/22/23 0500  BP: (!) 154/81 (!) 156/70 (!) 160/67 (!) 119/99  Pulse: 81  90 75  Resp: (!) 22 20 17 16   Temp:  98.1 F (36.7 C) 98.1 F (36.7 C) 98.1 F (36.7 C)  TempSrc:  Oral Oral Oral  SpO2: 95%  93% 96%  Weight:    94.4 kg  Height:        Intake/Output Summary (Last 24 hours) at 05/22/2023 0749 Last data filed at 05/22/2023 0343 Gross per 24 hour  Intake 1340 ml  Output 500 ml  Net 840 ml    Filed Weights   05/17/23 0317 05/19/23 0500 05/22/23 0500  Weight: 101.9 kg 101.1 kg 94.4 kg    Examination:  General:  Pleasantly resting in bed, No acute distress. HEENT:  Normocephalic atraumatic.  Sclerae nonicteric, noninjected.  Extraocular movements intact bilaterally. Neck:  Without mass or deformity.  Trachea is midline. Lungs:  Clear to auscultate bilaterally without rhonchi, wheeze, or rales. Heart:  Regular rate and rhythm.  Without murmurs, rubs, or gallops. Abdomen:  Soft, nontender, nondistended.  Without guarding or rebound. Extremities: Bilateral lower extremities without pitting edema, bandages clean dry intact  Data Reviewed: I have personally reviewed following labs and imaging studies  CBC: Recent Labs  Lab 05/15/23 1316 05/15/23 2305 05/16/23 0508 05/17/23 0557 05/19/23 0535 05/21/23 0513 05/22/23 0442  WBC 7.1  --  5.9 6.7 5.7 7.3 6.0  NEUTROABS 4.0  --   --   --   --   --   --   HGB 9.8*   < > 9.0* 10.4* 10.3* 11.9* 10.7*  HCT 33.6*   < > 30.1* 35.5* 33.7* 40.3 35.6*  MCV 87.7  --  86.7 87.9 84.3 85.6 83.6  PLT 278  --  236 288 269 394 391   < > = values in this interval not displayed.   Basic Metabolic Panel: Recent Labs  Lab 05/16/23 0508 05/17/23 0557 05/19/23 0535 05/21/23 0513 05/22/23 0442  NA  138 141 140 141 139  K 4.1 4.1 3.7 4.0 3.1*  CL 110 106 106 106 103  CO2 20* 24 26 22 26   GLUCOSE 71 80 83 84 129*  BUN 9 8 5* 5* 6  CREATININE 0.81 0.75 0.65 0.69 0.75  CALCIUM 8.3* 8.7* 8.8* 9.4 9.1   GFR: Estimated Creatinine Clearance: 93.8 mL/min (by C-G formula based on SCr of 0.75 mg/dL). Liver Function Tests: Recent Labs  Lab 05/15/23 1316  AST 21  ALT 16  ALKPHOS 59  BILITOT 0.5  PROT 6.9  ALBUMIN 3.0*   CBG: Recent Labs  Lab 05/20/23 2148 05/21/23 0656 05/21/23 1031 05/21/23 2121 05/22/23 0554  GLUCAP 169* 128* 95 246* 162*   Anemia Panel: No results for input(s): "VITAMINB12", "FOLATE", "FERRITIN", "TIBC", "IRON", "RETICCTPCT" in the last 72 hours.  Sepsis Labs: Recent Labs  Lab 05/15/23 1330 05/15/23 1546  LATICACIDVEN 0.9 1.2    Recent Results (from the past 240 hours)  Blood Cultures x 2 sites     Status: None   Collection Time: 05/15/23  3:35 PM   Specimen: BLOOD  Result Value Ref Range Status   Specimen Description BLOOD RIGHT ANTECUBITAL  Final   Special Requests   Final    BOTTLES DRAWN AEROBIC AND ANAEROBIC Blood  Culture adequate volume   Culture   Final    NO GROWTH 5 DAYS Performed at [facility], [address] [place], [place] [address]    Report Status 05/20/2023 FINAL  Final  Blood Cultures x 2 sites     Status: None   Collection Time: 05/15/23  3:42 PM   Specimen: BLOOD LEFT HAND  Result Value Ref Range Status   Specimen Description BLOOD LEFT HAND  Final   Special Requests   Final    BOTTLES DRAWN AEROBIC ONLY Blood Culture adequate volume   Culture   Final    NO GROWTH 5 DAYS Performed at [facility], [address] [place], [place] [address]    Report Status 05/20/2023 FINAL  Final  Surgical PCR screen     Status: None   Collection Time: 05/15/23  5:30 PM   Specimen: Nasal Mucosa; Nasal Swab  Result Value Ref Range Status   MRSA, PCR NEGATIVE NEGATIVE Final   Staphylococcus aureus NEGATIVE NEGATIVE  Final    Comment: (NOTE) The Xpert SA Assay (FDA approved for NASAL specimens in patients 57 years of age and older), is one component of a comprehensive surveillance program. It is not intended to diagnose infection nor to guide or monitor treatment. Performed at [facility], [address], [place], [place] [address]          Radiology Studies: PERIPHERAL VASCULAR CATHETERIZATION Result Date: 05/21/2023 Images from the original result were not included. Patient name: Carmen Armstrong MRN: [id number] DOB: 1965-07-27 Sex: female 05/15/2023 - 05/21/2023 Pre-operative Diagnosis: Bilateral toe wounds Post-operative diagnosis:  Same Surgeon:  Daria Pastures, MD Procedure Performed: Ultrasound-guided access of left common femoral artery Aortogram and bilateral lower extremity angiogram Second-order cannulation of right external iliac artery 12 minutes moderate sedation with fentanyl and Versed Indications: Carmen Armstrong is a 58 year old female with bilateral toe wounds.  She is a diabetic and has a history of extremity pain.  Noninvasive vascular labs demonstrated arterial insufficiency.  Risks and benefits of angiogram were reviewed, she expressed understanding and elected to proceed. Findings: Widely patent aorta and bilateral renal arteries.  Widely patent iliac systems bilaterally Right common femoral artery, profunda and SFA widely patent.  Patent popliteal artery with minimal stenosis.  Two-vessel runoff via the peroneal and PT.  Right AT diminutive proximally and occludes in its mid segment.  The DP is reconstituted by peroneal collaterals.  Inline flow to the foot via the PT and peroneal Left common femoral artery, profunda and SFA widely patent.  Popliteal artery widely patent.  Three-vessel runoff.  No flow-limiting stenosis.  Procedure:  The patient was identified in the holding area and taken to the cath lab  The patient was then placed supine on the table and prepped and draped in the usual  sterile fashion.  A time out was called.  Ultrasound was used to evaluate the left common femoral artery.  It was patent .  A digital ultrasound image was acquired.  A micropuncture needle was used to access the left common femoral artery under ultrasound guidance.  An 018 wire was advanced without resistance and a micropuncture sheath was placed.  The 018 wire was removed and a benson wire was placed.  The micropuncture sheath was exchanged for a 5 french sheath.  An omniflush catheter was advanced over the wire to the level of L-1.  An abdominal angiogram was obtained.  Next, using the omniflush catheter and a Bentson wire,  the aortic bifurcation was crossed and the catheter was placed into theright external iliac artery and right runoff was obtained. This demonstrated the above findings. right runoff was performed via retrograde sheath injections which demonstrated the above findings. Contrast: 60 cc Sedation: 12 minutes Impression: On the right, inline flow to the foot via the PT and peroneal, no flow-limiting stenosis On the left, inline flow with three-vessel runoff, no flow-limiting stenosis Daria Pastures MD [facility] Office: [phone]        Scheduled Meds:  [facility] Hold] acetaminophen  1,000 mg Oral Q8H   Or   [MAR Hold] acetaminophen  650 mg Rectal Q8H   [MAR Hold] amLODipine  10 mg Oral QHS   [MAR Hold] atorvastatin  40 mg Oral QHS   [MAR Hold] buPROPion  300 mg Oral q morning   [MAR Hold] enoxaparin (LOVENOX) injection  50 mg Subcutaneous Daily   [MAR Hold] FLUoxetine  20 mg Oral Daily   [MAR Hold] gabapentin  600 mg Oral QHS   [MAR Hold] insulin aspart  0-15 Units Subcutaneous TID WC   [MAR Hold] insulin glargine-yfgn  12 Units Subcutaneous Q2200   [MAR Hold] irbesartan  37.5 mg Oral Daily   [MAR Hold] linaclotide  145 mcg Oral Daily   [MAR Hold] methimazole  5 mg Oral Once per day on Monday Tuesday Wednesday Thursday Friday Saturday   [facility]  Hold] metroNIDAZOLE  500 mg Oral Q12H   [MAR Hold] morphine  15 mg Oral Q12H   [MAR Hold] nicotine  14 mg Transdermal Daily   [MAR Hold] pantoprazole  40 mg Oral Daily   [MAR Hold] sodium chloride flush  3 mL Intravenous Q12H   Continuous Infusions:  [MAR Hold] sodium chloride     [MAR Hold] cefTRIAXone (ROCEPHIN)  IV 2 g (05/21/23 1757)   lactated ringers     [MAR Hold] vancomycin 1,000 mg (05/22/23 0517)     LOS: 7 days   Time spent:  Azucena Fallen, DO [facility]  If 7PM-7AM, please contact night-coverage [email or web address]  05/22/2023, 7:49 AM

## 2023-05-22 NOTE — Op Note (Signed)
 Full Operative Report  Date of Operation: 7:36 AM, 05/22/2023   Patient: Carmen Armstrong - 58 y.o. female  Surgeon: Pilar Plate, DPM   Assistant: None  Diagnosis: Ostoemyelitis right great toe, left second toe  Procedure:  1. Amputation of great toe through IPJ, RIGHT foot 2. Amputation of second toe through proximal phalanx head, LEFT foot    Anesthesia: Monitor Anesthesia Care  Marcene Duos, MD  Anesthesiologist: Marcene Duos, MD CRNA: Colbert Coyer, CRNA   Estimated Blood Loss: Minimal   Hemostasis: 1) Anatomical dissection, mechanical compression, electrocautery 2) No tourniquet was used during procedure  Implants: * No implants in log *  Materials: Prolene 3-0   Injectables: 1) Pre-operatively: 10 cc of 50:50 mixture 1%lidocaine plain and 0.5% marcaine plain 2) Post-operatively: None   Specimens: Pathology: Right hallux and Left 2nd toe for path  Microbiology: Bone culture Right hallux and Left 2nd toe   Antibiotics: IV antibiotics given per schedule on the floor  Drains: None  Complications: Patient tolerated the procedure well without complication.   Operative findings: As below in detailed report  Indications for Procedure: Carmen Armstrong presents to [employer], Jenelle Mages, DPM with a chief complaint of ulceration and osteomyelitis of right great toe and left 2nd  toe. The patient has failed conservative treatments of various modalities. At this time the patient has elected to proceed with surgical correction. All alternatives, risks, and complications of the procedures were thoroughly explained to the patient. Patient exhibits appropriate understanding of all discussion points and informed consent was signed and obtained in the chart with no guarantees to surgical outcome given or implied.  Description of Procedure: Patient was brought to the operating room. Patient remained on their hospital bed in the supine position.  A surgical timeout was performed and all members of the operating room, the procedure, and the surgical site were identified. anesthesia occurred as per anesthesia record. Local anesthetic as previously described was then injected about the operative field in a local infiltrative block.  The operative lower extremity as noted above was then prepped and draped in the usual sterile manner. The following procedure then began.  Attention was directed to the hallux digit on the right foot. A full-thickness incision encompassing the entire digit was made using a #15 blade. Dissection was carried down to bone. The toe was secured with a towel clamp, further dissected in its entirety, and disarticulated at the IPJ and passed to the back table as a gross specimen. This was then labled and sent to pathology. The bone was noted to be soft and eroded, and consistent with osteomyelitis. All remaining necrotic and devitalized soft tissue structures were visualized and dissected away using sharp and dull dissection. Care was taken to protect all neurovascular structures throughout the dissection. All bleeders were cauterized as necessary. A bone culture was obtained from the distal phalanx. The area was then flushed with copious amounts of sterile saline. Then using the suture materials previously described, the site was closed in anatomic layers and the skin was well approximated under minimal tension.  Attention was directed to the 2nd digit on the LEFT foot. A full-thickness incision encompassing the entire digit was made using a #15 blade. Dissection was carried down to bone. The toe was secured with a towel clamp, further dissected in its entirety, and disarticulated at the PIPJ and passed to the back table as a gross specimen. This was then labled and sent to pathology. The bone was noted to be soft  and eroded, and consistent with osteomyelitis. All remaining necrotic and devitalized soft tissue structures were  visualized and dissected away using sharp and dull dissection. A bone cutting forceps was then used to resect the head of the proximal phalanx for closure purposes. Care was taken to protect all neurovascular structures throughout the dissection. All bleeders were cauterized as necessary. A bone culture was taken from the distal phalanx. The area was then flushed with copious amounts of sterile saline. Then using the suture materials previously described, the site was closed in anatomic layers and the skin was well approximated under minimal tension.   The surgical site was then dressed with xeroform 4x4 kerlix and ace wrap. The patient tolerated both the procedure and anesthesia well with vital signs stable throughout. The patient was transferred in good condition and all vital signs stable  from the OR to recovery under the discretion of anesthesia.  Condition: Vital signs stable, neurovascular status unchanged from preoperative   Surgical plan:  Expect clean margins bilaterally. WBAT in post op shoe. 5 days augmentin post op. Stable for DC tomorrow.  The patient will be WBAT in a post op shoe to the operative limb until further instructed. The dressing is to remain clean, dry, and intact. Will continue to follow unless noted elsewhere.   [name], DPM [facility]

## 2023-05-22 NOTE — Progress Notes (Signed)
 Orthopedic Tech Progress Note Patient Details:  Carmen Armstrong [birth year] [id number]  Ortho Devices Type of Ortho Device: Postop shoe/boot Ortho Device/Splint Location: BLE Ortho Device/Splint Interventions: Ordered, Application, Adjustment, Other (comment)   Post Interventions Patient Tolerated: Well Instructions Provided: Care of device  Donald Pore 05/22/2023, 11:17 AM

## 2023-05-22 NOTE — Progress Notes (Signed)
  Progress Note    05/22/2023 10:56 AM * Day of Surgery *  Subjective:  just complaining that she had terrible experience during her Angiogram yesterday   Vitals:   05/22/23 0852 05/22/23 0905  BP:    Pulse: 74 68  Resp: 11 16  Temp: 98.3 F (36.8 C) (!) 97.5 F (36.4 C)  SpO2: 98% 97%   Physical Exam: Cardiac:  regular Lungs:  non labored Extremities: left common femoral access site without swelling or hematoma. Doppler PT/Pero signals bilaterally Neurologic: alert and oriented  CBC    Component Value Date/Time   WBC 6.0 05/22/2023 0442   RBC 4.26 05/22/2023 0442   HGB 10.7 (L) 05/22/2023 0442   HCT 35.6 (L) 05/22/2023 0442   PLT 391 05/22/2023 0442   MCV 83.6 05/22/2023 0442   MCH 25.1 (L) 05/22/2023 0442   MCHC 30.1 05/22/2023 0442   RDW 19.5 (H) 05/22/2023 0442   LYMPHSABS 2.2 05/15/2023 1316   MONOABS 0.7 05/15/2023 1316   EOSABS 0.1 05/15/2023 1316   BASOSABS 0.0 05/15/2023 1316    BMET    Component Value Date/Time   NA 139 05/22/2023 0442   NA 138 12/06/2020 1134   K 3.1 (L) 05/22/2023 0442   CL 103 05/22/2023 0442   CO2 26 05/22/2023 0442   GLUCOSE 129 (H) 05/22/2023 0442   BUN 6 05/22/2023 0442   BUN 9 12/06/2020 1134   CREATININE 0.75 05/22/2023 0442   CALCIUM 9.1 05/22/2023 0442   GFRNONAA >60 05/22/2023 0442    INR No results found for: "INR"   Intake/Output Summary (Last 24 hours) at 05/22/2023 1056 Last data filed at 05/22/2023 1015 Gross per 24 hour  Intake 2085 ml  Output 903 ml  Net 1182 ml     Assessment/Plan:  58 y.o. female is s/p Aortogram of BLE 1 Day Post  Just out of OR with Podiatry Wound care per Podiatry Inline flow to the foot on the RLE with PT and peroneal Inline flow to the foot on the LLE with three vessel runoff Continue Statin She can follow up with vascular as needed  Graceann Congress, PA-C [facility] [phone] 05/22/2023 10:56 AM

## 2023-05-22 NOTE — Plan of Care (Signed)
  Problem: Education: Goal: Ability to describe self-care measures that may prevent or decrease complications (Diabetes Survival Skills Education) will improve Outcome: Progressing   Problem: Coping: Goal: Ability to adjust to condition or change in health will improve Outcome: Progressing   Problem: Fluid Volume: Goal: Ability to maintain a balanced intake and output will improve Outcome: Progressing   Problem: Health Behavior/Discharge Planning: Goal: Ability to identify and utilize available resources and services will improve Outcome: Progressing Goal: Ability to manage health-related needs will improve Outcome: Progressing   Problem: Metabolic: Goal: Ability to maintain appropriate glucose levels will improve Outcome: Progressing   Problem: Nutritional: Goal: Maintenance of adequate nutrition will improve Outcome: Progressing   Problem: Tissue Perfusion: Goal: Adequacy of tissue perfusion will improve Outcome: Progressing   Problem: Education: Goal: Knowledge of General Education information will improve Description: Including pain rating scale, medication(s)/side effects and non-pharmacologic comfort measures Outcome: Progressing   Problem: Clinical Measurements: Goal: Will remain free from infection Outcome: Progressing Goal: Diagnostic test results will improve Outcome: Progressing Goal: Cardiovascular complication will be avoided Outcome: Progressing   Problem: Activity: Goal: Risk for activity intolerance will decrease Outcome: Progressing   Problem: Elimination: Goal: Will not experience complications related to bowel motility Outcome: Progressing   Problem: Pain Managment: Goal: General experience of comfort will improve and/or be controlled Outcome: Progressing   Problem: Safety: Goal: Ability to remain free from injury will improve Outcome: Progressing   Problem: Skin Integrity: Goal: Risk for impaired skin integrity will decrease Outcome:  Progressing

## 2023-05-22 NOTE — Transfer of Care (Signed)
 Immediate Anesthesia Transfer of Care Note  Patient: Carmen Armstrong  Procedure(s) Performed: AMPUTATION, TOE (Left: Toe) AMPUTATION, FOOT, RAY (Right: Toe)  Patient Location: PACU  Anesthesia Type:MAC  Level of Consciousness: awake and alert   Airway & Oxygen Therapy: Patient Spontanous Breathing and Patient connected to nasal cannula oxygen  Post-op Assessment: Report given to RN and Post -op Vital signs reviewed and stable  Post vital signs: Reviewed and stable  Last Vitals:  Vitals Value Taken Time  BP 157/72 05/22/23 0823  Temp    Pulse 73 05/22/23 0825  Resp 22 05/22/23 0825  SpO2 94 % 05/22/23 0825  Vitals shown include unfiled device data.  Last Pain:  Vitals:   05/22/23 0500  TempSrc: Oral  PainSc:       Patients Stated Pain Goal: 0 (05/21/23 1045)  Complications: No notable events documented.

## 2023-05-22 NOTE — Progress Notes (Signed)
 History and Physical Interval Note:  05/22/2023 7:16 AM  Carmen Armstrong  has presented today for surgery, with the diagnosis of osteomyelitis of right great and left 2nd toes.  The various methods of treatment have been discussed with the patient and family. After consideration of risks, benefits and other options for treatment, the patient has consented to   Procedure(s) with comments: AMPUTATION, TOE (Bilateral) - Right great toe partial amp, Left 2nd toe partial amp as a surgical intervention.  The patient's history has been reviewed, patient examined, no change in status, stable for surgery.  I have reviewed the patient's chart and labs.  Questions were answered to the patient's satisfaction.     Jenelle Mages Darivs Lunden

## 2023-05-23 ENCOUNTER — Encounter (HOSPITAL_COMMUNITY): Payer: Self-pay | Admitting: Podiatry

## 2023-05-23 DIAGNOSIS — M869 Osteomyelitis, unspecified: Secondary | ICD-10-CM | POA: Diagnosis not present

## 2023-05-23 LAB — GLUCOSE, CAPILLARY
Glucose-Capillary: 165 mg/dL — ABNORMAL HIGH (ref 70–99)
Glucose-Capillary: 222 mg/dL — ABNORMAL HIGH (ref 70–99)
Glucose-Capillary: 131 mg/dL — ABNORMAL HIGH (ref 70–99)

## 2023-05-23 LAB — SURGICAL PATHOLOGY

## 2023-05-23 MED ORDER — INSULIN ASPART 100 UNIT/ML IJ SOLN
2.0000 [IU] | Freq: Three times a day (TID) | INTRAMUSCULAR | Status: DC
  Administered 2023-05-23 – 2023-05-24 (×3): 2 [IU] via SUBCUTANEOUS

## 2023-05-23 MED ORDER — METFORMIN HCL ER 500 MG PO TB24
2000.0000 mg | ORAL_TABLET | Freq: Every day | ORAL | Status: DC
  Administered 2023-05-23 – 2023-05-24 (×2): 2000 mg via ORAL
  Filled 2023-05-23 (×2): qty 4

## 2023-05-23 MED ORDER — DAPAGLIFLOZIN PROPANEDIOL 10 MG PO TABS
20.0000 mg | ORAL_TABLET | Freq: Every day | ORAL | Status: DC
  Administered 2023-05-23 – 2023-05-24 (×2): 20 mg via ORAL
  Filled 2023-05-23 (×3): qty 2

## 2023-05-23 MED ORDER — INSULIN GLARGINE-YFGN 100 UNIT/ML ~~LOC~~ SOLN
5.0000 [IU] | Freq: Two times a day (BID) | SUBCUTANEOUS | Status: DC
  Administered 2023-05-23 – 2023-05-24 (×3): 5 [IU] via SUBCUTANEOUS
  Filled 2023-05-23 (×4): qty 0.05

## 2023-05-23 MED ORDER — ZOLPIDEM TARTRATE 5 MG PO TABS
5.0000 mg | ORAL_TABLET | Freq: Once | ORAL | Status: AC
Start: 2023-05-23 — End: 2023-05-23
  Administered 2023-05-23: 5 mg via ORAL
  Filled 2023-05-23: qty 1

## 2023-05-23 MED ORDER — INSULIN ASPART 100 UNIT/ML IJ SOLN
0.0000 [IU] | Freq: Every day | INTRAMUSCULAR | Status: DC
Start: 2023-05-23 — End: 2023-05-24

## 2023-05-23 MED ORDER — INSULIN ASPART 100 UNIT/ML IJ SOLN
0.0000 [IU] | Freq: Three times a day (TID) | INTRAMUSCULAR | Status: DC
  Administered 2023-05-23: 1 [IU] via SUBCUTANEOUS
  Administered 2023-05-23: 2 [IU] via SUBCUTANEOUS

## 2023-05-23 MED ORDER — DAPAGLIFLOZIN PRO-METFORMIN ER 10-1000 MG PO TB24
2.0000 | ORAL_TABLET | Freq: Every day | ORAL | Status: DC
Start: 2023-05-23 — End: 2023-05-23

## 2023-05-23 MED ORDER — APIXABAN 5 MG PO TABS
5.0000 mg | ORAL_TABLET | Freq: Two times a day (BID) | ORAL | Status: DC
  Administered 2023-05-23 – 2023-05-24 (×3): 5 mg via ORAL
  Filled 2023-05-23 (×2): qty 1

## 2023-05-23 MED ORDER — METOPROLOL SUCCINATE ER 50 MG PO TB24
50.0000 mg | ORAL_TABLET | Freq: Every day | ORAL | Status: DC
  Administered 2023-05-23 – 2023-05-24 (×2): 50 mg via ORAL
  Filled 2023-05-23 (×2): qty 1

## 2023-05-23 NOTE — Progress Notes (Signed)
 [facility] PROGRESS NOTE    Progress Note  Carmen Armstrong  ZOX:[id number] DOB: Apr 16, 1965 DOA: 05/15/2023 PCP: Lewis Moccasin, MD     Brief Narrative:   Carmen Armstrong is an 58 y.o. 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, chronic atrial fibrillation on Eliquis, chronic diastolic dysfunction, hypothyroidism, depression comes into the ED for worsening right greater toe and left second toe wound with drainage swelling and uncontrollable pain he had been previously on oral antibiotics and outpatient and he has been noncompliant   Assessment/Plan:   Osteomyelitis of fifth toe of left foot (HCC)/Osteomyelitis of second toe of left foot (HCC)/ Osteomyelitis of great toe of right foot (HCC)/ Cellulitis of lower extremity: Podiatrist was consulted and he status post right hallux and left second toe amputation on 05/18/2023. On admission he was started on IV vancomycin Rocephin and Flagyl. We were unable to perform MRI due to spinal stimulator.  CT of the lower extremities confirm osteomyelitis. ABI showed right lower extremity mild arterial disease. Vascular surgery was consulted on 05/21/2023 and angiogram was performed that showed no limiting flow stenosis. Taken to the OR again on 05/22/2022 for right greater toe and left second toe amputation. Podiatry recommended weightbearing as tolerated with postop shoe of the limb. He will continue amoxicillin for 10 days postdischarge. PT /OT eval is pending.  Normocytic anemia: FOBT negative no signs of overt bleeding.  Uncontrolled diabetes mellitus type 2 with polyneuropathy: With an A1c of 13 just last month.  She has been having trouble for the previous 5 weeks on getting her insulin. Continue long-acting insulin plus sliding scale. Blood glucose seems well-controlled here in the hospital with 12 units of long-acting plus sliding  scale.  Hypotension/history of essential hypertension: Hold spironolactone, Imdur and amlodipine. Hypotension is now resolved.  Paroxysmal atrial fibrillation: Continue metoprolol and Eliquis.  Chronic diastolic dysfunction: Holding GDMT due to episode of hypotension. Not on a diuretic at home.  CAD/PVD/hyperlipidemia: Continue Eliquis and statins. Angiogram showed showed no flow-limiting stenosis.  DVT prophylaxis: eliquis Family Communication:none Status is: Inpatient Remains inpatient appropriate because: Osteomyelitis of toes and right and left foot bilaterally    Code Status:     Code Status Orders  (From admission, onward)           Start     Ordered   05/15/23 1527  Full code  Continuous       Question:  By:  Answer:  Consent: discussion documented in EHR   05/15/23 1527           Code Status History     Date Active Date Inactive Code Status Order ID Comments User Context   04/20/2023 0135 04/21/2023 1840 Full Code [id number]  Eduard Clos, MD ED   05/08/2022 1132 05/09/2022 2041 Full Code [id number]  Venita Lick, MD Inpatient         IV Access:   Peripheral IV   Procedures and diagnostic studies:   DG Foot 2 Views Left Result Date: 05/22/2023 CLINICAL DATA:  Status post toe amputation EXAM: LEFT FOOT - 2 VIEW COMPARISON:  05/15/2023 FINDINGS: There is been interval amputation of the second middle and distal phalanges as well as a portion of the proximal phalanx distally. Prior amputation of the first and fifth toes is noted. No bony erosive changes are seen. Persistent tarsal degenerative changes are noted. IMPRESSION: Interval amputation of the second toe as described. Electronically Signed   By: Loraine Leriche  Lukens M.D.   On: 05/22/2023 10:40   DG Foot 2 Views Right Result Date: 05/22/2023 CLINICAL DATA:  Status post toe amputation EXAM: RIGHT FOOT - 2 VIEW COMPARISON:  05/15/2023 FINDINGS: There has been interval amputation of the first distal  phalanx. No bony erosive changes are seen. Tarsal degenerative changes are again noted. No other focal abnormality is noted. IMPRESSION: Status post first toe amputation. Electronically Signed   By: Alcide Clever M.D.   On: 05/22/2023 10:40     Medical Consultants:   None.   Subjective:    [name] feels better no complaints  Objective:    Vitals:   05/23/23 0006 05/23/23 0500 05/23/23 0500 05/23/23 0737  BP: (!) 166/70  (!) 147/61 (!) 148/69  Pulse: 71  65 69  Resp: 16  16   Temp: 98.4 F (36.9 C)  98.2 F (36.8 C) 98.5 F (36.9 C)  TempSrc:    Oral  SpO2: 94%  96% 98%  Weight:  94.4 kg    Height:       SpO2: 98 % O2 Flow Rate (L/min): 2 L/min   Intake/Output Summary (Last 24 hours) at 05/23/2023 1000 Last data filed at 05/23/2023 0900 Gross per 24 hour  Intake 480 ml  Output 400 ml  Net 80 ml   Filed Weights   05/19/23 0500 05/22/23 0500 05/23/23 0500  Weight: 101.1 kg 94.4 kg 94.4 kg    Exam: General exam: In no acute distress. Respiratory system: Good air movement and clear to auscultation. Cardiovascular system: S1 & S2 heard, RRR. No JVD. Gastrointestinal system: Abdomen is nondistended, soft and nontender.  Extremities: Both legs are wrapped Skin: No rashes, lesions or ulcers Psychiatry: Judgement and insight appear normal. Mood & affect appropriate.    Data Reviewed:    Labs: Basic Metabolic Panel: Recent Labs  Lab 05/17/23 0557 05/19/23 0535 05/21/23 0513 05/22/23 0442  NA 141 140 141 139  K 4.1 3.7 4.0 3.1*  CL 106 106 106 103  CO2 24 26 22 26   GLUCOSE 80 83 84 129*  BUN 8 5* 5* 6  CREATININE 0.75 0.65 0.69 0.75  CALCIUM 8.7* 8.8* 9.4 9.1   GFR Estimated Creatinine Clearance: 93.8 mL/min (by C-G formula based on SCr of 0.75 mg/dL). Liver Function Tests: No results for input(s): "AST", "ALT", "ALKPHOS", "BILITOT", "PROT", "ALBUMIN" in the last 168 hours. No results for input(s): "LIPASE", "AMYLASE" in the last 168  hours. No results for input(s): "AMMONIA" in the last 168 hours. Coagulation profile No results for input(s): "INR", "PROTIME" in the last 168 hours. COVID-19 Labs  No results for input(s): "DDIMER", "FERRITIN", "LDH", "CRP" in the last 72 hours.  Lab Results  Component Value Date   SARSCOV2NAA NEGATIVE 04/19/2023    CBC: Recent Labs  Lab 05/17/23 0557 05/19/23 0535 05/21/23 0513 05/22/23 0442  WBC 6.7 5.7 7.3 6.0  HGB 10.4* 10.3* 11.9* 10.7*  HCT 35.5* 33.7* 40.3 35.6*  MCV 87.9 84.3 85.6 83.6  PLT 288 269 394 391   Cardiac Enzymes: No results for input(s): "CKTOTAL", "CKMB", "CKMBINDEX", "TROPONINI" in the last 168 hours. BNP (last 3 results) No results for input(s): "PROBNP" in the last 8760 hours. CBG: Recent Labs  Lab 05/22/23 0554 05/22/23 0823 05/22/23 1143 05/22/23 1604 05/22/23 2208  GLUCAP 162* 165* 160* 195* 144*   D-Dimer: No results for input(s): "DDIMER" in the last 72 hours. Hgb A1c: No results for input(s): "HGBA1C" in the last 72 hours. Lipid Profile: Recent Labs  05/22/23 0442  CHOL 68  HDL 28*  LDLCALC 26  TRIG 68  CHOLHDL 2.4   Thyroid function studies: No results for input(s): "TSH", "T4TOTAL", "T3FREE", "THYROIDAB" in the last 72 hours.  Invalid input(s): "FREET3" Anemia work up: No results for input(s): "VITAMINB12", "FOLATE", "FERRITIN", "TIBC", "IRON", "RETICCTPCT" in the last 72 hours. Sepsis Labs: Recent Labs  Lab 05/17/23 0557 05/19/23 0535 05/21/23 0513 05/22/23 0442  WBC 6.7 5.7 7.3 6.0   Microbiology Recent Results (from the past 240 hours)  Blood Cultures x 2 sites     Status: None   Collection Time: 05/15/23  3:35 PM   Specimen: BLOOD  Result Value Ref Range Status   Specimen Description BLOOD RIGHT ANTECUBITAL  Final   Special Requests   Final    BOTTLES DRAWN AEROBIC AND ANAEROBIC Blood Culture adequate volume   Culture   Final    NO GROWTH 5 DAYS Performed at [facility], [address]  [place], [place] [address]    Report Status 05/20/2023 FINAL  Final  Blood Cultures x 2 sites     Status: None   Collection Time: 05/15/23  3:42 PM   Specimen: BLOOD LEFT HAND  Result Value Ref Range Status   Specimen Description BLOOD LEFT HAND  Final   Special Requests   Final    BOTTLES DRAWN AEROBIC ONLY Blood Culture adequate volume   Culture   Final    NO GROWTH 5 DAYS Performed at [facility], [address], [place], [place] [address]    Report Status 05/20/2023 FINAL  Final  Surgical PCR screen     Status: None   Collection Time: 05/15/23  5:30 PM   Specimen: Nasal Mucosa; Nasal Swab  Result Value Ref Range Status   MRSA, PCR NEGATIVE NEGATIVE Final   Staphylococcus aureus NEGATIVE NEGATIVE Final    Comment: (NOTE) The Xpert SA Assay (FDA approved for NASAL specimens in patients 90 years of age and older), is one component of a comprehensive surveillance program. It is not intended to diagnose infection nor to guide or monitor treatment. Performed at [facility], [address] [place], [place] [address]   Aerobic/Anaerobic Culture w Gram Stain (surgical/deep wound)     Status: None (Preliminary result)   Collection Time: 05/22/23  7:35 AM   Specimen: Soft Tissue, Other  Result Value Ref Range Status   Specimen Description TISSUE  Final   Special Requests A  Final   Gram Stain   Final    NO WBC SEEN NO ORGANISMS SEEN Performed at [facility], [address] [place], [place] [address]    Culture PENDING  Incomplete   Report Status PENDING  Incomplete  Aerobic/Anaerobic Culture w Gram Stain (surgical/deep wound)     Status: None (Preliminary result)   Collection Time: 05/22/23  7:50 AM   Specimen: Soft Tissue, Other  Result Value Ref Range Status   Specimen Description TISSUE  Final   Special Requests B  Final   Gram Stain   Final    NO WBC SEEN NO ORGANISMS SEEN Performed at [facility], [address], [place], [place] [address]     Culture PENDING  Incomplete   Report Status PENDING  Incomplete     Medications:    acetaminophen  1,000 mg Oral Q8H   Or   acetaminophen  650 mg Rectal Q8H   amLODipine  10 mg Oral QHS   amoxicillin-clavulanate  1 tablet Oral Q12H   apixaban  5 mg Oral BID   atorvastatin  40 mg Oral QHS   buPROPion  300 mg Oral q morning   FLUoxetine  20 mg Oral Daily   gabapentin  600 mg Oral QHS   insulin aspart  0-15 Units Subcutaneous TID WC   insulin glargine-yfgn  12 Units Subcutaneous Q2200   irbesartan  37.5 mg Oral Daily   linaclotide  145 mcg Oral Daily   methimazole  5 mg Oral Once per day on Monday Tuesday Wednesday Thursday Friday Saturday   morphine  15 mg Oral Q12H   nicotine  14 mg Transdermal Daily   pantoprazole  40 mg Oral Daily   polyethylene glycol  17 g Oral BID   senna-docusate  1 tablet Oral BID   sodium chloride flush  3 mL Intravenous Q12H   Continuous Infusions:    LOS: 8 days   Marinda Elk  [facility]  05/23/2023, 10:00 AM

## 2023-05-23 NOTE — Evaluation (Signed)
 Physical Therapy Evaluation and Discharge Patient Details Name: Carmen Armstrong MRN: [id number] DOB: September 22, 1965 Today's Date: 05/23/2023  History of Present Illness  Pt is a 58 y.o. 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 Significant PMH: DM2 with peripheral neuropathy, chronic atrial fibrillation, chronic diastolic dysfunction, depression, hypothyroidism.  Clinical Impression  Patient evaluated by Physical Therapy with no further acute PT needs identified. Pt pleasant and agreeable to work in physical therapy. Reports increased pain/spasm in left foot with weightbearing (RN notified and provided pain medication). Pt ambulating 50 ft with a cane with bilateral post op shoes donned. Negotiated 3 steps with railings to simulate home set up. Education provided regarding post op shoe use, monitoring for signs/symptoms of infection, activity modification, and elevation. Pt plans to discharge home with her mother/sister who can provide adequate assist. All education has been completed and the patient has no further questions. No follow-up Physical Therapy or equipment needs. PT is signing off. Thank you for this referral.       If plan is discharge home, recommend the following: Assistance with cooking/housework;Assist for transportation;Help with stairs or ramp for entrance   Can travel by private vehicle        Equipment Recommendations None recommended by PT  Recommendations for Other Services       Functional Status Assessment Patient has had a recent decline in their functional status and demonstrates the ability to make significant improvements in function in a reasonable and predictable amount of time.     Precautions / Restrictions Precautions Precautions: Fall Required Braces or Orthoses: Other Brace Other Brace: bilateral post op shoes Restrictions Weight Bearing  Restrictions Per Provider Order: Yes RLE Weight Bearing Per Provider Order: Weight bearing as tolerated LLE Weight Bearing Per Provider Order: Weight bearing as tolerated      Mobility  Bed Mobility Overal bed mobility: Independent                  Transfers Overall transfer level: Modified independent                      Ambulation/Gait Ambulation/Gait assistance: Supervision Gait Distance (Feet): 50 Feet Assistive device: Straight cane Gait Pattern/deviations: Step-through pattern, Decreased stride length Gait velocity: decreased Gait velocity interpretation: <1.8 ft/sec, indicate of risk for recurrent falls   General Gait Details: Verbal cues for keeping weight more towards back of foot, supervision for safety, mild dynamic instability  Stairs Stairs: Yes Stairs assistance: Supervision Stair Management: Two rails Number of Stairs: 3 General stair comments: Cues for sequencing/technique  Wheelchair Mobility     Tilt Bed    Modified Rankin (Stroke Patients Only)       Balance Overall balance assessment: Needs assistance Sitting-balance support: Feet supported Sitting balance-Leahy Scale: Good     Standing balance support: During functional activity, Single extremity supported Standing balance-Leahy Scale: Fair                               Pertinent Vitals/Pain Pain Assessment Pain Assessment: Faces Faces Pain Scale: Hurts even more Pain Location: L foot Pain Descriptors / Indicators: Spasm, Grimacing, Operative site guarding Pain Intervention(s): Limited activity within patient's tolerance, Monitored during session, Patient requesting pain meds-RN notified    Home Living Family/patient expects to be discharged to:: Private residence  Living Arrangements: Other relatives (niece) Available Help at Discharge: Family Type of Home: House Home Access: Stairs to enter Entrance Stairs-Rails: Doctor, general practice of  Steps: 4   Home Layout: One level Home Equipment: Agricultural consultant (2 wheels);BSC/3in1;Cane - single point;Adaptive equipment Additional Comments: Plans to d/c home with mother and sister initially who have similar home set up    Prior Function Prior Level of Function : Independent/Modified Independent                     Extremity/Trunk Assessment   Upper Extremity Assessment Upper Extremity Assessment: Overall WFL for tasks assessed    Lower Extremity Assessment Lower Extremity Assessment: RLE deficits/detail;LLE deficits/detail RLE Deficits / Details: Hip/knee WFL, kerlix/ace wrap donned on foot LLE Deficits / Details: Hip/knee WFL, kerlix/ace wrap donned on foot    Cervical / Trunk Assessment Cervical / Trunk Assessment: Normal  Communication   Communication Communication: No apparent difficulties    Cognition Arousal: Alert Behavior During Therapy: WFL for tasks assessed/performed   PT - Cognitive impairments: No apparent impairments                         Following commands: Intact       Cueing       General Comments      Exercises     Assessment/Plan    PT Assessment Patient does not need any further PT services  PT Problem List         PT Treatment Interventions      PT Goals (Current goals can be found in the Care Plan section)  Acute Rehab PT Goals Patient Stated Goal: go home PT Goal Formulation: All assessment and education complete, DC therapy    Frequency       Co-evaluation               AM-PAC PT "6 Clicks" Mobility  Outcome Measure Help needed turning from your back to your side while in a flat bed without using bedrails?: None Help needed moving from lying on your back to sitting on the side of a flat bed without using bedrails?: None Help needed moving to and from a bed to a chair (including a wheelchair)?: None Help needed standing up from a chair using your arms (e.g., wheelchair or bedside chair)?:  None Help needed to walk in hospital room?: A Little Help needed climbing 3-5 steps with a railing? : A Little 6 Click Score: 22    End of Session Equipment Utilized During Treatment: Other (comment) (post op shoes) Activity Tolerance: Patient tolerated treatment well Patient left: in chair;with call bell/phone within reach Nurse Communication: Mobility status;Patient requests pain meds PT Visit Diagnosis: Pain;Difficulty in walking, not elsewhere classified (R26.2) Pain - Right/Left:  (both) Pain - part of body: Ankle and joints of foot    Time: 5284-1324 PT Time Calculation (min) (ACUTE ONLY): 33 min   Charges:   PT Evaluation $PT Eval Low Complexity: 1 Low PT Treatments $Therapeutic Activity: 8-22 mins PT General Charges $$ ACUTE PT VISIT: 1 Visit         Lillia Pauls, PT, DPT Acute Rehabilitation Services Office [phone]   Norval Morton 05/23/2023, 4:16 PM

## 2023-05-23 NOTE — Progress Notes (Signed)
  Subjective:  Patient ID: Carmen Armstrong, female    DOB: Jul 14, 1965,  MRN: [id number]  Chief Complaint  Patient presents with   Toe Injury    DOS: 05/22/2023 Procedure: 1. Amputation of great toe through IPJ, RIGHT foot 2. Amputation of second toe through proximal phalanx head, LEFT foot  58 y.o. female seen for post op check.  Patient reports she is doing well she says that she is doing better after she got some pain medication this morning.  Pain jumps from foot to foot.  She is hopeful for discharge home today.  Review of Systems: Negative except as noted in the HPI. Denies N/V/F/Ch.   Objective:   Vitals:   05/23/23 0500 05/23/23 0737  BP: (!) 147/61 (!) 148/69  Pulse: 65 69  Resp: 16   Temp: 98.2 F (36.8 C) 98.5 F (36.9 C)  SpO2: 96% 98%   Body mass index is 30.73 kg/m. Constitutional Well developed. Well nourished.  Vascular Foot warm and well perfused. Capillary refill normal to all digits.   No calf pain with palpation  Neurologic Normal speech. Oriented to person, place, and time. Epicritic sensation diminished bilateral foot  Dermatologic Dressings clean dry intact bilateral foot  Orthopedic: Status post right hallux amputation IPJ level and left second toe amputation PIPJ level   Radiographs: Left foot: There is been interval amputation of the second middle and distal phalanges as well as a portion of the proximal phalanx distally. Prior amputation of the first and fifth toes is noted. Right foot: There has been interval amputation of the first distal phalanx.   Pathology: Pending  Micro: Pending  Assessment:   Osteomyelitis right hallux distal phalanx and left second toe distal phalanx status post amputation partially of both toes  Plan:  Patient was evaluated and treated and all questions answered.  POD # 1 s/p right hallux partial amputation and left second toe partial amputation -Progressing as expected postoperatively -XR: Expected  postop changes -WB Status: Weightbearing as tolerated in postop shoe bilaterally -Sutures: Remain intact 2 to 3 weeks. -Medications/ABX: Augmentin 7 days on discharge - Dressing to remain clean dry and intact until follow-up in the office next week Monday or Tuesday will have office call to arrange         Corinna Gab, DPM [facility] / [facility]

## 2023-05-23 NOTE — Plan of Care (Signed)
  Problem: Education: Goal: Knowledge of General Education information will improve Description: Including pain rating scale, medication(s)/side effects and non-pharmacologic comfort measures Outcome: Progressing   Problem: Pain Managment: Goal: General experience of comfort will improve and/or be controlled Outcome: Progressing   Problem: Pain Managment: Goal: General experience of comfort will improve and/or be controlled Outcome: Progressing

## 2023-05-24 ENCOUNTER — Other Ambulatory Visit (HOSPITAL_COMMUNITY): Payer: Self-pay

## 2023-05-24 ENCOUNTER — Telehealth (HOSPITAL_COMMUNITY): Payer: Self-pay | Admitting: Pharmacy Technician

## 2023-05-24 DIAGNOSIS — M869 Osteomyelitis, unspecified: Secondary | ICD-10-CM | POA: Diagnosis not present

## 2023-05-24 DIAGNOSIS — L03115 Cellulitis of right lower limb: Secondary | ICD-10-CM | POA: Diagnosis not present

## 2023-05-24 LAB — GLUCOSE, CAPILLARY: Glucose-Capillary: 125 mg/dL — ABNORMAL HIGH (ref 70–99)

## 2023-05-24 MED ORDER — METFORMIN HCL ER 500 MG PO TB24
2000.0000 mg | ORAL_TABLET | Freq: Every day | ORAL | 1 refills | Status: DC
  Filled 2023-05-24: qty 120, 30d supply, fill #0

## 2023-05-24 MED ORDER — DAPAGLIFLOZIN PROPANEDIOL 10 MG PO TABS
20.0000 mg | ORAL_TABLET | Freq: Every day | ORAL | 0 refills | Status: DC
  Filled 2023-05-24: qty 30, 15d supply, fill #0

## 2023-05-24 MED ORDER — AMOXICILLIN-POT CLAVULANATE 875-125 MG PO TABS
1.0000 | ORAL_TABLET | Freq: Two times a day (BID) | ORAL | 0 refills | Status: AC
Start: 2023-05-24 — End: 2023-05-31
  Filled 2023-05-24: qty 14, 7d supply, fill #0

## 2023-05-24 NOTE — Evaluation (Signed)
 Occupational Therapy Evaluation Patient Details Name: Carmen Armstrong MRN: [id number] DOB: [birth year] Today's Date: 05/24/2023   History of Present Illness   Pt is a 58 y.o. 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 Significant PMH: DM2 with peripheral neuropathy, chronic atrial fibrillation, chronic diastolic dysfunction, depression, hypothyroidism.     Clinical Impressions Pt admitted based on above, and was seen based on problem list below. PTA pt was living alone with her niece staying with her occasionally. She was independent with ADLs and IADLs. Today pt is at her functional baseline of Mod I with ADLs and functional transfers. Education completed on use of DME, AE, and energy conservation strategies to use upon d/c. Pt verbalized understanding of all education provided. Anticipate no follow up OT needs. No further acute OT needs identified, OT is signing off on this pt.        If plan is discharge home, recommend the following:   Assistance with cooking/housework     Functional Status Assessment   Patient has not had a recent decline in their functional status     Equipment Recommendations   None recommended by OT (All DME needs met)     Recommendations for Other Services         Precautions/Restrictions   Precautions Precautions: Fall Required Braces or Orthoses: Other Brace Other Brace: bilateral post op shoes Restrictions Weight Bearing Restrictions Per Provider Order: Yes RLE Weight Bearing Per Provider Order: Weight bearing as tolerated LLE Weight Bearing Per Provider Order: Weight bearing as tolerated     Mobility Bed Mobility Overal bed mobility: Independent      Transfers Overall transfer level: Modified independent     General transfer comment: Use of SPC      Balance Overall balance assessment: Needs  assistance Sitting-balance support: Feet supported Sitting balance-Leahy Scale: Good     Standing balance support: During functional activity, Single extremity supported Standing balance-Leahy Scale: Fair       ADL either performed or assessed with clinical judgement   ADL Overall ADL's : Modified independent;At baseline   General ADL Comments: Pt presenting at baseline with SPC     Vision Baseline Vision/History: 0 No visual deficits Vision Assessment?: No apparent visual deficits            Pertinent Vitals/Pain Pain Assessment Pain Assessment: Faces Faces Pain Scale: Hurts a little bit Pain Location: L foot Pain Descriptors / Indicators: Discomfort Pain Intervention(s): Monitored during session, Repositioned     Extremity/Trunk Assessment Upper Extremity Assessment Upper Extremity Assessment: Overall WFL for tasks assessed   Lower Extremity Assessment Lower Extremity Assessment: Defer to PT evaluation   Cervical / Trunk Assessment Cervical / Trunk Assessment: Normal   Communication Communication Communication: No apparent difficulties   Cognition Arousal: Alert Behavior During Therapy: WFL for tasks assessed/performed     Following commands: Intact       Cueing  General Comments   Cueing Techniques: Verbal cues  Dressings in tact, able to don/doff post-op shoe Independently           Home Living Family/patient expects to be discharged to:: Private residence Living Arrangements: Alone (niece can stay if needed) Available Help at Discharge: Family Type of Home: House Home Access: Stairs to enter Secretary/administrator of Steps: 4 Entrance Stairs-Rails: Right;Left Home Layout: One level     Bathroom Shower/Tub: Tub/shower unit  Bathroom Toilet: Standard Bathroom Accessibility: Yes How Accessible: Accessible via walker Home Equipment: Rolling Walker (2 wheels);BSC/3in1;Cane - single point;Adaptive equipment Adaptive Equipment:  Reacher;Sock aid Additional Comments: Plans to d/c home with mother and sister initially who have similar home set up      Prior Functioning/Environment Prior Level of Function : Independent/Modified Independent       Mobility Comments: Walking with cane      OT Problem List: Decreased strength;Impaired balance (sitting and/or standing)        OT Goals(Current goals can be found in the care plan section)   Acute Rehab OT Goals Patient Stated Goal: To go home OT Goal Formulation: All assessment and education complete, DC therapy Time For Goal Achievement: 06/07/23 Potential to Achieve Goals: Good   AM-PAC OT "6 Clicks" Daily Activity     Outcome Measure Help from another person eating meals?: None Help from another person taking care of personal grooming?: None Help from another person toileting, which includes using toliet, bedpan, or urinal?: None Help from another person bathing (including washing, rinsing, drying)?: None Help from another person to put on and taking off regular upper body clothing?: None Help from another person to put on and taking off regular lower body clothing?: None 6 Click Score: 24   End of Session Equipment Utilized During Treatment: Other (comment) [facility]) Nurse Communication: Mobility status  Activity Tolerance: Patient tolerated treatment well Patient left: in chair;with call bell/phone within reach  OT Visit Diagnosis: Unsteadiness on feet (R26.81);Other abnormalities of gait and mobility (R26.89);Muscle weakness (generalized) (M62.81)                Time: 1610-9604 OT Time Calculation (min): 16 min Charges:  OT General Charges $OT Visit: 1 Visit OT Evaluation $OT Eval Low Complexity: 1 Low  Ivor Messier, OT  Acute Rehabilitation Services Office [phone] Secure chat preferred   Marilynne Drivers 05/24/2023, 9:03 AM

## 2023-05-24 NOTE — Anesthesia Postprocedure Evaluation (Signed)
 Anesthesia Post Note  Patient: Carmen Armstrong  Procedure(s) Performed: AMPUTATION, TOE (Left: Toe) AMPUTATION, FOOT, RAY (Right: Toe)     Patient location during evaluation: PACU Anesthesia Type: MAC Level of consciousness: awake and alert Pain management: pain level controlled Vital Signs Assessment: post-procedure vital signs reviewed and stable Respiratory status: spontaneous breathing, nonlabored ventilation, respiratory function stable and patient connected to nasal cannula oxygen Cardiovascular status: stable and blood pressure returned to baseline Postop Assessment: no apparent nausea or vomiting Anesthetic complications: no   No notable events documented.  Last Vitals:  Vitals:   05/24/23 0437 05/24/23 0713  BP: 139/65 137/72  Pulse: 63 67  Resp: 18   Temp: 36.9 C 36.6 C  SpO2: 92% 100%    Last Pain:  Vitals:   05/24/23 0810  TempSrc:   PainSc: 0-No pain                 Kennieth Rad

## 2023-05-24 NOTE — Discharge Summary (Addendum)
 Physician Discharge Summary  Carmen Armstrong MWU:[id number] DOB: Jun 16, 1965 DOA: 05/15/2023  PCP: Carmen Moccasin, MD  Admit date: 05/15/2023 Discharge date: 05/24/2023  Admitted From: Home Disposition:  Home  Recommendations for Outpatient Follow-up:  Follow up with podiatrist in 1-2 weeks Please obtain BMP/CBC in one week   Home Health:No Equipment/Devices:None  Discharge Condition:Stable CODE STATUS:Full Diet recommendation: Heart Healthy  Brief/Interim Summary: 58 y.o. 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, chronic atrial fibrillation on Eliquis, chronic diastolic dysfunction, hypothyroidism, depression comes into the ED for worsening right greater toe and left second toe wound with drainage swelling and uncontrollable pain he had been previously on oral antibiotics and outpatient and he has been noncompliant   Discharge Diagnoses:  Principal Problem:   Osteomyelitis of fifth toe of left foot (HCC) Active Problems:   Osteomyelitis of second toe of left foot (HCC)   Osteomyelitis of great toe of right foot (HCC)   Cellulitis of lower extremity  Osteomyelitis of the fifth left toe, second toe of the left foot and great toe of the right foot/with bilateral lower extremity cellulitis: Podiatry was consulted and she status post surgical amputation on 05/18/2023. On admission she was started on IV vancomycin and Rocephin. ABIs were done that showed mild arterial disease vascular was consulted and perform a angiogram on 05/21/2023 that showed no limiting flow stenosis. Taken to the OR again on 05/22/2022 for amputation of the right great toe and left second toe. Podiatrist recommended amoxicillin for 10 days postdischarge. PT evaluated the patient recommended no home health PT. Follow-up with podiatry as an outpatient.  Normocytic anemia: Need to follow-up with PCP as an outpatient recommend colonoscopy as  an outpatient.  Uncontrolled diabetes mellitus type 2 with polyneuropathy: With an A1c of 13 just last month, she relates she has been without her insulin for 5 weeks due to financial and insurance situation. She has not gotten since then. No changes made to her regimen. Metformin and Marcelline Deist were added will need to check hemoglobin A1c in 3 months follow-up with PCP and titrate regimen as needed.  Essential hypertension: Initially with hypotension on admission antihypertensive medications hypotension resolved her blood pressure started to improve. She will resume her regimen as an outpatient.  Paroxysmal atrial fibrillation: No changes made to her medication continue metoprolol and Eliquis.  Chronic diastolic dysfunction: Due to her episode of hypotension GDMT were held, they were slowly restarted. She will resume her regimen without any changes in outpatient.  CAD/PVD/hyperlipidemia: No changes made to her medication she was continued on Eliquis and statins. Angiogram showed no flow-limiting stenosis.  Discharge Instructions  Discharge Instructions     Diet - low sodium heart healthy   Complete by: As directed    Increase activity slowly   Complete by: As directed    No wound care   Complete by: As directed       Allergies as of 05/24/2023       Reactions   Other Shortness Of Breath   Hazelnuts   Corylus Other (See Comments)   Unknown    Omeprazole Other (See Comments)   Unknown    Pear Other (See Comments)   Unknown    Penicillin G Other (See Comments)   Whelps - childhood allergy   Trulicity [dulaglutide] Other (See Comments)   Large itchy bumps        Medication List     TAKE these medications    Airsupra 90-80  MCG/ACT Aero Generic drug: Albuterol-Budesonide Inhale 2 puffs into the lungs in the morning and at bedtime.   amLODipine 10 MG tablet Commonly known as: NORVASC Take 1 tablet (10 mg total) by mouth daily. What changed: when to take this    amoxicillin-clavulanate 875-125 MG tablet Commonly known as: AUGMENTIN Take 1 tablet by mouth every 12 (twelve) hours for 7 days.   atorvastatin 40 MG tablet Commonly known as: LIPITOR Take 40 mg by mouth at bedtime.   buPROPion 300 MG 24 hr tablet Commonly known as: WELLBUTRIN XL Take 300 mg by mouth every morning.   candesartan 4 MG tablet Commonly known as: ATACAND Take 4 mg by mouth daily.   colchicine 0.6 MG tablet Take 0.6-1.2 mg by mouth See admin instructions. Take 1.2 mg for the first initial dose then 0.6 mg daily.   Eliquis 5 MG Tabs tablet Generic drug: apixaban Take 1 tablet (5 mg total) by mouth 2 (two) times daily.   FLUoxetine 20 MG capsule Commonly known as: PROZAC Take 20 mg by mouth daily.   fluticasone-salmeterol 250-50 MCG/ACT Aepb Commonly known as: ADVAIR Inhale 1 puff into the lungs 2 (two) times daily.   gabapentin 600 MG tablet Commonly known as: NEURONTIN Take 600 mg by mouth at bedtime.   Linzess 145 MCG Caps capsule Generic drug: linaclotide Take 145 mcg by mouth daily.   methimazole 5 MG tablet Commonly known as: TAPAZOLE Take 1 tablet (5 mg total) by mouth as directed. 1 tablet Monday through Saturday, none on Sundays   metoprolol succinate 100 MG 24 hr tablet Commonly known as: TOPROL-XL Take 100 mg by mouth daily.   morphine 15 MG 12 hr tablet Commonly known as: MS CONTIN Take 15 mg by mouth every 12 (twelve) hours.   naloxone 4 MG/0.1ML Liqd nasal spray kit Commonly known as: NARCAN Place 1 spray into the nose as needed (OD).   NovoLOG FlexPen 100 UNIT/ML FlexPen Generic drug: insulin aspart Inject 10-20 Units into the skin 3 (three) times daily with meals. What changed: how much to take   omeprazole 20 MG capsule Commonly known as: PRILOSEC Take 20 mg by mouth daily.   spironolactone 25 MG tablet Commonly known as: ALDACTONE Take 1 tablet (25 mg total) by mouth daily. What changed: when to take this    tirzepatide 10 MG/0.5ML Pen Commonly known as: MOUNJARO Inject 10 mg into the skin once a week.   Evaristo Bury FlexTouch 100 UNIT/ML FlexTouch Pen Generic drug: insulin degludec Inject 64 Units into the skin daily.   Xigduo XR 11-998 MG Tb24 Generic drug: Dapagliflozin Pro-metFORMIN ER Take 2 tablets by mouth daily.   zolpidem 6.25 MG CR tablet Commonly known as: AMBIEN CR Take 12.5 mg by mouth at bedtime.        Follow-up Information     [facility] Follow up.   Why: As needed Contact information: [address] [place] [address] [phone]               Allergies  Allergen Reactions   Other Shortness Of Breath    Hazelnuts   Corylus Other (See Comments)    Unknown    Omeprazole Other (See Comments)    Unknown    Pear Other (See Comments)    Unknown    Penicillin G Other (See Comments)    Whelps - childhood allergy   Trulicity [Dulaglutide] Other (See Comments)    Large itchy bumps    Consultations: Podiatrist Vascular  surgery   Procedures/Studies: DG Foot 2 Views Left Result Date: 05/22/2023 CLINICAL DATA:  Status post toe amputation EXAM: LEFT FOOT - 2 VIEW COMPARISON:  05/15/2023 FINDINGS: There is been interval amputation of the second middle and distal phalanges as well as a portion of the proximal phalanx distally. Prior amputation of the first and fifth toes is noted. No bony erosive changes are seen. Persistent tarsal degenerative changes are noted. IMPRESSION: Interval amputation of the second toe as described. Electronically Signed   By: Alcide Clever M.D.   On: 05/22/2023 10:40   DG Foot 2 Views Right Result Date: 05/22/2023 CLINICAL DATA:  Status post toe amputation EXAM: RIGHT FOOT - 2 VIEW COMPARISON:  05/15/2023 FINDINGS: There has been interval amputation of the first distal phalanx. No bony erosive changes are seen. Tarsal degenerative changes are again noted. No other focal abnormality is noted.  IMPRESSION: Status post first toe amputation. Electronically Signed   By: Alcide Clever M.D.   On: 05/22/2023 10:40   PERIPHERAL VASCULAR CATHETERIZATION Result Date: 05/21/2023 Images from the original result were not included. Patient name: Carmen Armstrong MRN: [id number] DOB: 06-19-1965 Sex: female 05/15/2023 - 05/21/2023 Pre-operative Diagnosis: Bilateral toe wounds Post-operative diagnosis:  Same Surgeon:  Daria Pastures, MD Procedure Performed: Ultrasound-guided access of left common femoral artery Aortogram and bilateral lower extremity angiogram Second-order cannulation of right external iliac artery 12 minutes moderate sedation with fentanyl and Versed Indications: Carmen Armstrong is a 58 year old female with bilateral toe wounds.  She is a diabetic and has a history of extremity pain.  Noninvasive vascular labs demonstrated arterial insufficiency.  Risks and benefits of angiogram were reviewed, she expressed understanding and elected to proceed. Findings: Widely patent aorta and bilateral renal arteries.  Widely patent iliac systems bilaterally Right common femoral artery, profunda and SFA widely patent.  Patent popliteal artery with minimal stenosis.  Two-vessel runoff via the peroneal and PT.  Right AT diminutive proximally and occludes in its mid segment.  The DP is reconstituted by peroneal collaterals.  Inline flow to the foot via the PT and peroneal Left common femoral artery, profunda and SFA widely patent.  Popliteal artery widely patent.  Three-vessel runoff.  No flow-limiting stenosis.  Procedure:  The patient was identified in the holding area and taken to the cath lab  The patient was then placed supine on the table and prepped and draped in the usual sterile fashion.  A time out was called.  Ultrasound was used to evaluate the left common femoral artery.  It was patent .  A digital ultrasound image was acquired.  A micropuncture needle was used to access the left common femoral artery under  ultrasound guidance.  An 018 wire was advanced without resistance and a micropuncture sheath was placed.  The 018 wire was removed and a benson wire was placed.  The micropuncture sheath was exchanged for a 5 french sheath.  An omniflush catheter was advanced over the wire to the level of L-1.  An abdominal angiogram was obtained.  Next, using the omniflush catheter and a Bentson wire, the aortic bifurcation was crossed and the catheter was placed into theright external iliac artery and right runoff was obtained. This demonstrated the above findings. right runoff was performed via retrograde sheath injections which demonstrated the above findings. Contrast: 60 cc Sedation: 12 minutes Impression: On the right, inline flow to the foot via the PT and peroneal, no flow-limiting stenosis On the left, inline flow with three-vessel runoff, no flow-limiting  stenosis Daria Pastures MD [facility] Office: [phone]  VAS [place] ABI WITH/WO TBI Result Date: 05/18/2023  LOWER EXTREMITY DOPPLER STUDY Patient Name:  Carmen Armstrong  Date of Exam:   05/17/2023 Medical Rec #: [id number]             Accession #:    [id number] Date of Birth: 21-Feb-1965             Patient Gender: F Patient Age:   1 years Exam Location:  [facility] Procedure:      VAS [place] ABI WITH/WO TBI Referring Phys: Merrilyn Puma --------------------------------------------------------------------------------  Indications: Peripheral artery disease. High Risk         Hypertension, hyperlipidemia, Diabetes, coronary artery Factors:          disease. Other Factors: PAD.  Comparison Study: Reduced waveforms, ABIs, and TBIs since previous exam                   02/25/21. Performing Technologist: Shona Simpson  Examination Guidelines: A complete evaluation includes at minimum, Doppler waveform signals and systolic blood pressure reading at the level of bilateral brachial, anterior tibial, and posterior tibial arteries, when  vessel segments are accessible. Bilateral testing is considered an integral part of a complete examination. Photoelectric Plethysmograph (PPG) waveforms and toe systolic pressure readings are included as required and additional duplex testing as needed. Limited examinations for reoccurring indications may be performed as noted.  ABI Findings: +---------+------------------+-----+----------+----------+ Right    Rt Pressure (mmHg)IndexWaveform  Comment    +---------+------------------+-----+----------+----------+ Brachial 171                    triphasic            +---------+------------------+-----+----------+----------+ PTA      155               0.86 monophasic           +---------+------------------+-----+----------+----------+ DP       151               0.84 monophasic           +---------+------------------+-----+----------+----------+ Great Toe0                 0.00 Absent    Second toe +---------+------------------+-----+----------+----------+ +---------+------------------+-----+---------+----------+ Left     Lt Pressure (mmHg)IndexWaveform Comment    +---------+------------------+-----+---------+----------+ Brachial 180                    triphasic           +---------+------------------+-----+---------+----------+ PTA      165               0.92 triphasic           +---------+------------------+-----+---------+----------+ DP       159               0.88 biphasic            +---------+------------------+-----+---------+----------+ Great Toe156               0.87 Abnormal Second toe +---------+------------------+-----+---------+----------+ +-------+-----------+-----------+------------+------------+ ABI/TBIToday's ABIToday's TBIPrevious ABIPrevious TBI +-------+-----------+-----------+------------+------------+ Right  0.86       0          1.01        0.67         +-------+-----------+-----------+------------+------------+ Left   0.92        0.87       1.02  0.97         +-------+-----------+-----------+------------+------------+  Bilateral ABIs appear decreased compared to prior study on 02/25/21. Bilateral TBIs appear decreased compared to prior study on 02/25/21.  Summary: Right: Resting right ankle-brachial index indicates mild right lower extremity arterial disease. The right toe-brachial index is abnormal. Left: Resting left ankle-brachial index indicates mild left lower extremity arterial disease. The left toe-brachial index is normal. *See table(s) above for measurements and observations.  Electronically signed by Heath Lark on 05/18/2023 at 1:12:24 PM.    Final    CT FOOT LEFT WO CONTRAST Result Date: 05/16/2023 CLINICAL DATA:  Osteomyelitis EXAM: CT OF THE LEFT FOOT WITHOUT CONTRAST TECHNIQUE: Multidetector CT imaging of the left foot was performed according to the standard protocol. Multiplanar CT image reconstructions were also generated. RADIATION DOSE REDUCTION: This exam was performed according to the departmental dose-optimization program which includes automated exposure control, adjustment of the mA and/or kV according to patient size and/or use of iterative reconstruction technique. COMPARISON:  Foot radiographs 05/15/2023 FINDINGS: Bones/Joint/Cartilage Prior amputation of the great toe at the interphalangeal joint and prior transmetatarsal fifth ray amputation. Abnormal sclerosis in the distal phalanx of the second toe compatible with osteomyelitis. 1.2 cm non-fragmented osteochondral lesion of the anterior talar dome. Dorsal midfoot spurring. Spurring of the distal tibial rim. Mild Achilles calcaneal spurring. Ligaments Suboptimally assessed by CT. 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 Soft tissues Subcutaneous edema along the dorsum of the ankle. Mild cutaneous irregularity in subcutaneous edema in the second toe suspicious for cellulitis.  IMPRESSION: 1. Abnormal sclerosis in the distal phalanx of the second toe compatible with osteomyelitis. 2. Mild cutaneous irregularity and subcutaneous edema in the second toe suspicious for cellulitis. 3. 1.2 cm non-fragmented osteochondral lesion of the anterior talar dome. 4. 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 5. Prior amputation of the great toe at the interphalangeal joint and prior transmetatarsal fifth ray amputation. 6. Subcutaneous edema along the dorsum of the ankle. Electronically Signed   By: Gaylyn Rong M.D.   On: 05/16/2023 10:25   CT FOOT RIGHT WO CONTRAST Result Date: 05/16/2023 CLINICAL DATA:  Osteomyelitis EXAM: CT OF THE RIGHT FOOT WITHOUT CONTRAST TECHNIQUE: Multidetector CT imaging of the right foot was performed according to the standard protocol. Multiplanar CT image reconstructions were also generated. RADIATION DOSE REDUCTION: This exam was performed according to the departmental dose-optimization program which includes automated exposure control, adjustment of the mA and/or kV according to patient size and/or use of iterative reconstruction technique. COMPARISON:  Radiographs 05/15/2023 FINDINGS: Bones/Joint/Cartilage Dorsum medial erosion of the tuft of the distal phalanx great toe with overlying gas in the soft tissues, with active osteomyelitis. Dorsal midfoot spurring. Dorsal spurring at the talonavicular articulation. Small plantar and Achilles calcaneal spurs. Mild spurring along the distal tibial rim. Ligaments Suboptimally assessed by CT. Muscles and Tendons Moderate regional muscular atrophy. This is more striking in the abductor digiti minimi muscle favoring Baxter's neuropathy. Soft tissues Mild dorsal subcutaneous edema along the ankle and forefoot. Thinning of the soft tissues along the distal great toe with a loculation of gas in the soft tissues in this vicinity compatible with infection/ulceration.  IMPRESSION: 1. Osteomyelitis of the tuft of the distal phalanx great toe with overlying gas in the soft tissues compatible with infection/ulceration. 2. Moderate regional muscular atrophy. This is more striking in the abductor digiti minimi muscle favoring Baxter's neuropathy. 3. Mild dorsal subcutaneous edema along  the ankle and forefoot. 4. Small plantar and Achilles calcaneal spurs. 5. Dorsal midfoot spurring. Electronically Signed   By: Gaylyn Rong M.D.   On: 05/16/2023 10:19    Subjective: No complaints  Discharge Exam: Vitals:   05/24/23 0437 05/24/23 0713  BP: 139/65 137/72  Pulse: 63 67  Resp: 18   Temp: 98.4 F (36.9 C) 97.9 F (36.6 C)  SpO2: 92% 100%   Vitals:   05/23/23 1954 05/24/23 0437 05/24/23 0455 05/24/23 0713  BP: (!) 153/52 139/65  137/72  Pulse: 66 63  67  Resp: 16 18    Temp: 98 F (36.7 C) 98.4 F (36.9 C)  97.9 F (36.6 C)  TempSrc: Oral Oral  Oral  SpO2: 98% 92%  100%  Weight:   94.4 kg   Height:        General: Pt is alert, awake, not in acute distress Cardiovascular: RRR, S1/S2 +, no rubs, no gallops Respiratory: CTA bilaterally, no wheezing, no rhonchi Abdominal: Soft, NT, ND, bowel sounds + Extremities: no edema, no cyanosis    The results of significant diagnostics from this hospitalization (including imaging, microbiology, ancillary and laboratory) are listed below for reference.     Microbiology: Recent Results (from the past 240 hours)  Blood Cultures x 2 sites     Status: None   Collection Time: 05/15/23  3:35 PM   Specimen: BLOOD  Result Value Ref Range Status   Specimen Description BLOOD RIGHT ANTECUBITAL  Final   Special Requests   Final    BOTTLES DRAWN AEROBIC AND ANAEROBIC Blood Culture adequate volume   Culture   Final    NO GROWTH 5 DAYS Performed at [facility], [address] [place], [place] [address]    Report Status 05/20/2023 FINAL  Final  Blood Cultures x 2 sites     Status: None   Collection  Time: 05/15/23  3:42 PM   Specimen: BLOOD LEFT HAND  Result Value Ref Range Status   Specimen Description BLOOD LEFT HAND  Final   Special Requests   Final    BOTTLES DRAWN AEROBIC ONLY Blood Culture adequate volume   Culture   Final    NO GROWTH 5 DAYS Performed at [facility], [address], [place], [place] [address]    Report Status 05/20/2023 FINAL  Final  Surgical PCR screen     Status: None   Collection Time: 05/15/23  5:30 PM   Specimen: Nasal Mucosa; Nasal Swab  Result Value Ref Range Status   MRSA, PCR NEGATIVE NEGATIVE Final   Staphylococcus aureus NEGATIVE NEGATIVE Final    Comment: (NOTE) The Xpert SA Assay (FDA approved for NASAL specimens in patients 75 years of age and older), is one component of a comprehensive surveillance program. It is not intended to diagnose infection nor to guide or monitor treatment. Performed at [facility], [address], [place], [place] [address]   Aerobic/Anaerobic Culture w Gram Stain (surgical/deep wound)     Status: None (Preliminary result)   Collection Time: 05/22/23  7:35 AM   Specimen: Soft Tissue, Other  Result Value Ref Range Status   Specimen Description TISSUE  Final   Special Requests A  Final   Gram Stain NO WBC SEEN NO ORGANISMS SEEN   Final   Culture   Final    NO GROWTH 2 DAYS Performed at [facility], [address], [place], [place] [address]    Report Status  PENDING  Incomplete  Aerobic/Anaerobic Culture w Gram Stain (surgical/deep wound)     Status: None (Preliminary result)   Collection Time: 05/22/23  7:50 AM   Specimen: Soft Tissue, Other  Result Value Ref Range Status   Specimen Description TISSUE  Final   Special Requests B  Final   Gram Stain NO WBC SEEN NO ORGANISMS SEEN   Final   Culture   Final    NO GROWTH 2 DAYS Performed at [facility], [address], [place], [place] [address]    Report Status PENDING  Incomplete     Labs: BNP (last 3 results) Recent  Labs    04/19/23 2034  BNP 56.1   Basic Metabolic Panel: Recent Labs  Lab 05/19/23 0535 05/21/23 0513 05/22/23 0442  NA 140 141 139  K 3.7 4.0 3.1*  CL 106 106 103  CO2 26 22 26   GLUCOSE 83 84 129*  BUN 5* 5* 6  CREATININE 0.65 0.69 0.75  CALCIUM 8.8* 9.4 9.1   Liver Function Tests: No results for input(s): "AST", "ALT", "ALKPHOS", "BILITOT", "PROT", "ALBUMIN" in the last 168 hours. No results for input(s): "LIPASE", "AMYLASE" in the last 168 hours. No results for input(s): "AMMONIA" in the last 168 hours. CBC: Recent Labs  Lab 05/19/23 0535 05/21/23 0513 05/22/23 0442  WBC 5.7 7.3 6.0  HGB 10.3* 11.9* 10.7*  HCT 33.7* 40.3 35.6*  MCV 84.3 85.6 83.6  PLT 269 394 391   Cardiac Enzymes: No results for input(s): "CKTOTAL", "CKMB", "CKMBINDEX", "TROPONINI" in the last 168 hours. BNP: Invalid input(s): "POCBNP" CBG: Recent Labs  Lab 05/22/23 2208 05/23/23 1103 05/23/23 1642 05/23/23 2134 05/24/23 0611  GLUCAP 144* 165* 222* 131* 125*   D-Dimer No results for input(s): "DDIMER" in the last 72 hours. Hgb A1c No results for input(s): "HGBA1C" in the last 72 hours. Lipid Profile Recent Labs    05/22/23 0442  CHOL 68  HDL 28*  LDLCALC 26  TRIG 68  CHOLHDL 2.4   Thyroid function studies No results for input(s): "TSH", "T4TOTAL", "T3FREE", "THYROIDAB" in the last 72 hours.  Invalid input(s): "FREET3" Anemia work up No results for input(s): "VITAMINB12", "FOLATE", "FERRITIN", "TIBC", "IRON", "RETICCTPCT" in the last 72 hours. Urinalysis    Component Value Date/Time   COLORURINE YELLOW 05/15/2023 2212   APPEARANCEUR HAZY (A) 05/15/2023 2212   LABSPEC 1.013 05/15/2023 2212   PHURINE 5.0 05/15/2023 2212   GLUCOSEU >=500 (A) 05/15/2023 2212   HGBUR NEGATIVE 05/15/2023 2212   BILIRUBINUR NEGATIVE 05/15/2023 2212   KETONESUR NEGATIVE 05/15/2023 2212   PROTEINUR NEGATIVE 05/15/2023 2212   NITRITE NEGATIVE 05/15/2023 2212   LEUKOCYTESUR TRACE (A)  05/15/2023 2212   Sepsis Labs Recent Labs  Lab 05/19/23 0535 05/21/23 0513 05/22/23 0442  WBC 5.7 7.3 6.0   Microbiology Recent Results (from the past 240 hours)  Blood Cultures x 2 sites     Status: None   Collection Time: 05/15/23  3:35 PM   Specimen: BLOOD  Result Value Ref Range Status   Specimen Description BLOOD RIGHT ANTECUBITAL  Final   Special Requests   Final    BOTTLES DRAWN AEROBIC AND ANAEROBIC Blood Culture adequate volume   Culture   Final    NO GROWTH 5 DAYS Performed at [facility], [address], [place], [place] [address]    Report Status 05/20/2023 FINAL  Final  Blood Cultures x 2 sites     Status: None   Collection Time: 05/15/23  3:42 PM   Specimen: BLOOD LEFT HAND  Result Value Ref Range Status   Specimen Description BLOOD LEFT HAND  Final   Special Requests   Final    BOTTLES DRAWN AEROBIC ONLY Blood Culture adequate volume   Culture   Final    NO GROWTH 5 DAYS Performed at [facility], [address] [place], [place] [address]    Report Status 05/20/2023 FINAL  Final  Surgical PCR screen     Status: None   Collection Time: 05/15/23  5:30 PM   Specimen: Nasal Mucosa; Nasal Swab  Result Value Ref Range Status   MRSA, PCR NEGATIVE NEGATIVE Final   Staphylococcus aureus NEGATIVE NEGATIVE Final    Comment: (NOTE) The Xpert SA Assay (FDA approved for NASAL specimens in patients 48 years of age and older), is one component of a comprehensive surveillance program. It is not intended to diagnose infection nor to guide or monitor treatment. Performed at [facility], [address], [place], [place] [address]   Aerobic/Anaerobic Culture w Gram Stain (surgical/deep wound)     Status: None (Preliminary result)   Collection Time: 05/22/23  7:35 AM   Specimen: Soft Tissue, Other  Result Value Ref Range Status   Specimen Description TISSUE  Final   Special Requests A  Final   Gram Stain NO WBC SEEN NO ORGANISMS SEEN   Final    Culture   Final    NO GROWTH 2 DAYS Performed at [facility], [address] [place], [place] [address]    Report Status PENDING  Incomplete  Aerobic/Anaerobic Culture w Gram Stain (surgical/deep wound)     Status: None (Preliminary result)   Collection Time: 05/22/23  7:50 AM   Specimen: Soft Tissue, Other  Result Value Ref Range Status   Specimen Description TISSUE  Final   Special Requests B  Final   Gram Stain NO WBC SEEN NO ORGANISMS SEEN   Final   Culture   Final    NO GROWTH 2 DAYS Performed at [facility], [address] [place], [place] [address]    Report Status PENDING  Incomplete     Time coordinating discharge: Over 35 minutes  SIGNED:   Marinda Elk, MD  [facility] 05/24/2023, 10:12 AM Pager   If 7PM-7AM, please contact night-coverage [email or web address] Password [email or web address]

## 2023-05-24 NOTE — Telephone Encounter (Signed)
 Pharmacy Patient Advocate Encounter  Received notification from [facility] that Prior Authorization for Farxiga 10 mg has been CANCELLED due to

## 2023-05-24 NOTE — Progress Notes (Signed)
 Discharge packet (AVS) provided to pt with instructions. Pt verbalized understanding of instructions. NO complaints. TOC meds provided. Pt  d/c to home as ordered, Family is responsible for her ride,

## 2023-05-24 NOTE — Plan of Care (Signed)
  Problem: Education: Goal: Knowledge of General Education information will improve Description: Including pain rating scale, medication(s)/side effects and non-pharmacologic comfort measures Outcome: Adequate for Discharge   Problem: Activity: Goal: Risk for activity intolerance will decrease Outcome: Adequate for Discharge   Problem: Nutrition: Goal: Adequate nutrition will be maintained Outcome: Adequate for Discharge   Problem: Elimination: Goal: Will not experience complications related to bowel motility Outcome: Adequate for Discharge   Problem: Pain Managment: Goal: General experience of comfort will improve and/or be controlled Outcome: Adequate for Discharge

## 2023-05-24 NOTE — Progress Notes (Signed)
 Mobility Specialist Progress Note:    05/24/23 1000  Mobility  Activity Ambulated with assistance in hallway  Level of Assistance Standby assist, set-up cues, supervision of patient - no hands on  Assistive Device Cane  Distance Ambulated (ft) 120 ft  RLE Weight Bearing Per Provider Order WBAT  LLE Weight Bearing Per Provider Order WBAT  Activity Response Tolerated well  Mobility Referral Yes  Mobility visit 1 Mobility  Mobility Specialist Start Time (ACUTE ONLY) 1017  Mobility Specialist Stop Time (ACUTE ONLY) 1025  Mobility Specialist Time Calculation (min) (ACUTE ONLY) 8 min   Received pt in bed having no complaints and agreeable to mobility. Pt was asymptomatic throughout ambulation and returned to room w/o fault. Left on EOB w/ call bell in reach and all needs met.   D'Vante Earlene Plater Mobility Specialist Please contact via Special educational needs teacher or Rehab office at [phone]

## 2023-05-24 NOTE — Telephone Encounter (Signed)
 Pharmacy Patient Advocate Encounter   Received notification from Inpatient Request that prior authorization for Farxiga 10MG  tablets is required/requested.   Insurance verification completed.   The patient is insured through [facility] .   Per test claim: PA required; PA started via CoverMyMeds. KEY [id number] . Waiting for clinical questions to populate.

## 2023-05-28 ENCOUNTER — Ambulatory Visit: Admitting: Podiatry

## 2023-05-28 ENCOUNTER — Ambulatory Visit (INDEPENDENT_AMBULATORY_CARE_PROVIDER_SITE_OTHER)

## 2023-05-28 ENCOUNTER — Encounter: Payer: Self-pay | Admitting: Podiatry

## 2023-05-28 VITALS — BP 117/59 | Temp 98.1°F

## 2023-05-28 DIAGNOSIS — M7752 Other enthesopathy of left foot: Secondary | ICD-10-CM | POA: Diagnosis not present

## 2023-05-28 DIAGNOSIS — M869 Osteomyelitis, unspecified: Secondary | ICD-10-CM | POA: Diagnosis not present

## 2023-05-28 DIAGNOSIS — M7751 Other enthesopathy of right foot: Secondary | ICD-10-CM | POA: Diagnosis not present

## 2023-05-28 DIAGNOSIS — R410 Disorientation, unspecified: Secondary | ICD-10-CM

## 2023-05-28 LAB — AEROBIC/ANAEROBIC CULTURE W GRAM STAIN (SURGICAL/DEEP WOUND)
Gram Stain: NONE SEEN
Gram Stain: NONE SEEN

## 2023-05-28 LAB — GLUCOSE, RANDOM

## 2023-05-30 NOTE — Progress Notes (Signed)
 Subjective: Chief Complaint  Patient presents with   Routine Post Op    RM#12 POV#1 R hallux and Left 2nd toe amp (per Dr. Blossom Burkes states pain 5-6 on a pain scale no signs of infection at this time.   58 year old female presents the office today follow-up evaluation status post right hallux and left second toe potation with Dr. Demetria Finch.  States that she has been doing well.  She does have pain but seems to be controlled.  She has been working surgical shoes.  No injuries or other concerns today.  Objective: AAO x3, NAD DP/PT pulses palpable bilaterally, CRT less than 3 seconds Incisions are well coapted with sutures intact bilaterally without any evidence of dehiscence.  Small is edema there is no erythema or warmth.  There is no ascending size.  There is no drainage or pus.  Mild tenderness on exam. No pain with calf compression, swelling, warmth, erythema  Assessment: Status post hallux, second toe amputation  Plan: -All treatment options discussed with the patient including all alternatives, risks, complications.  -X-rays obtained reviewed.  Status post amputation with well-defined margins.  No evidence of acute fracture or cortical destruction suggest osteomyelitis. -Xeroform was applied followed by dressing.  She keep the dressing clean, dry, intact until follow-up. -Surgical shoe bilaterally as well as elevation with limited weightbearing -Of note during the appointment she started slurring her words.  Checked her blood sugar and it was 74 other vitals were stable.  Rechecked and it was down to 69.  Patient was given crackers and juice and her sugar did go up and she felt better. She left with her sister and informed to closely monitor blood sugar.  -Monitor for any clinical signs or symptoms of infection and directed to call the office immediately should any occur or go to the ER. -Patient encouraged to call the office with any questions, concerns, change in symptoms.    Charity Conch DPM

## 2023-06-04 DIAGNOSIS — G8918 Other acute postprocedural pain: Secondary | ICD-10-CM | POA: Insufficient documentation

## 2023-06-05 ENCOUNTER — Ambulatory Visit (INDEPENDENT_AMBULATORY_CARE_PROVIDER_SITE_OTHER): Admitting: Podiatry

## 2023-06-05 DIAGNOSIS — Z91199 Patient's noncompliance with other medical treatment and regimen due to unspecified reason: Secondary | ICD-10-CM

## 2023-06-05 NOTE — Progress Notes (Signed)
 No show for apt.

## 2023-06-07 ENCOUNTER — Ambulatory Visit (INDEPENDENT_AMBULATORY_CARE_PROVIDER_SITE_OTHER): Admitting: Podiatry

## 2023-06-07 ENCOUNTER — Encounter: Payer: Self-pay | Admitting: Podiatry

## 2023-06-07 DIAGNOSIS — Z89422 Acquired absence of other left toe(s): Secondary | ICD-10-CM

## 2023-06-07 DIAGNOSIS — S98111A Complete traumatic amputation of right great toe, initial encounter: Secondary | ICD-10-CM

## 2023-06-07 NOTE — Progress Notes (Signed)
  Subjective:  Patient ID: Carmen Armstrong, female    DOB: 06-20-1965,  MRN: [id number]  DOS: 05/22/2023 Procedure: 1. Amputation of great toe through IPJ, RIGHT foot 2. Amputation of second toe through proximal phalanx head, LEFT foot  58 y.o. female seen for post op check.  She is now approximately 2 weeks status post above procedure.  Review of Systems: Negative except as noted in the HPI. Denies N/V/F/Ch.   Objective:   There were no vitals filed for this visit.  There is no height or weight on file to calculate BMI. Constitutional Well developed. Well nourished.  Vascular Foot warm and well perfused. Capillary refill normal to all digits.   No calf pain with palpation  Neurologic Normal speech. Oriented to person, place, and time. Epicritic sensation diminished bilateral foot  Dermatologic Left second and right hallux amputation site is healing very well no dehiscence drainage or erythema.  No frank necrosis       Orthopedic: Status post right hallux amputation IPJ level and left second toe amputation PIPJ level   Radiographs: Left foot: There is been interval amputation of the second middle and distal phalanges as well as a portion of the proximal phalanx distally. Prior amputation of the first and fifth toes is noted. Right foot: There has been interval amputation of the first distal phalanx.   Pathology: A. TOE, LEFT SECOND, AMPUTATION:       Skin and subcutaneous tissue with chronic inflammation and  fibrosis.      Bone with marked reactive changes.       No evidence of acute osteomyelitis.       Surgical resection margin is viable without necrosis or  osteomyelitis.   B. TOE, RIGHT GREAT, AMPUTATION:       Chronic osteomyelitis.       Subcutaneous tissue with abscess formation.       Surgical resection margin is viable without necrosis or acute osteomyelitis.   Micro: STAPHYLOCOCCUS WARNERI   Assessment:   Osteomyelitis right hallux distal phalanx and  left second toe distal phalanx status post amputation partially of both toes  Plan:  Patient was evaluated and treated and all questions answered.   2 wk s/p right hallux partial amputation and left second toe partial amputation -Progressing as expected postoperatively, amp sites healing well -XR: Expected postop changes -WB Status: Weightbearing as tolerated in postop shoe bilaterally -Sutures: Removed in total at this visit -Medications/ABX: No antibiotics indicated - Okay to get bilateral foot wet and wash the feet in the shower with warm soapy water and then dry off apply small amount of antibiotic ointment for moisturizing of the amp site does not need dressing        Maridee Shoemaker, DPM [facility] / [facility]

## 2023-06-13 LAB — LAB REPORT - SCANNED
A1c: 6.1
Albumin, Urine POC: 13.1
Creatinine, POC: 65.3 mg/dL
EGFR: 95
Microalb Creat Ratio: 20
TSH: 1.33 (ref 0.41–5.90)

## 2023-06-19 ENCOUNTER — Ambulatory Visit (INDEPENDENT_AMBULATORY_CARE_PROVIDER_SITE_OTHER): Admitting: Podiatry

## 2023-06-19 DIAGNOSIS — S98111A Complete traumatic amputation of right great toe, initial encounter: Secondary | ICD-10-CM

## 2023-06-19 DIAGNOSIS — Z89422 Acquired absence of other left toe(s): Secondary | ICD-10-CM

## 2023-06-19 NOTE — Progress Notes (Signed)
  Subjective:  Patient ID: Carmen Armstrong, female    DOB: [birth year],  MRN: [id number]  DOS: 05/22/2023 Procedure: 1. Amputation of great toe through IPJ, RIGHT foot 2. Amputation of second toe through proximal phalanx head, LEFT foot  58 y.o. female seen for post op check.  She is now approximately 4 weeks status post above procedure.  She reports she is doing well.  She has been dressing the right great toe with Betadine and Band-Aid dressings.  Has noticed that there is a little black spot that formed on the end of the toe.  She reports the left second toe is doing well no issues no dressing is needed there.  Walking in postop shoe.  Review of Systems: Negative except as noted in the HPI. Denies N/V/F/Ch.   Objective:   There were no vitals filed for this visit.  There is no height or weight on file to calculate BMI. Constitutional Well developed. Well nourished.  Vascular Foot warm and well perfused. Capillary refill normal to all digits.   No calf pain with palpation  Neurologic Normal speech. Oriented to person, place, and time. Epicritic sensation diminished bilateral foot  Dermatologic Left second toe amputation site  fully healed at this time Right hallux amputation site with very mild superficial dehiscence distally with some slow healing/eschar present at the amputation line.  Does not appear to be acutely infected or a deep dehiscence       Orthopedic: Status post right hallux amputation IPJ level and left second toe amputation PIPJ level   Radiographs: Left foot: There is been interval amputation of the second middle and distal phalanges as well as a portion of the proximal phalanx distally. Prior amputation of the first and fifth toes is noted. Right foot: There has been interval amputation of the first distal phalanx.   Pathology: A. TOE, LEFT SECOND, AMPUTATION:       Skin and subcutaneous tissue with chronic inflammation and  fibrosis.      Bone with  marked reactive changes.       No evidence of acute osteomyelitis.       Surgical resection margin is viable without necrosis or  osteomyelitis.   B. TOE, RIGHT GREAT, AMPUTATION:       Chronic osteomyelitis.       Subcutaneous tissue with abscess formation.       Surgical resection margin is viable without necrosis or acute osteomyelitis.   Micro: STAPHYLOCOCCUS WARNERI   Assessment:   Osteomyelitis right hallux distal phalanx and left second toe distal phalanx status post amputation partially of both toes  Plan:  Patient was evaluated and treated and all questions answered.   4 wk s/p right hallux partial amputation and left second toe partial amputation -Progressing as expected postoperatively, amp sites healing well, slow healing of the right hallux amputation due to PAD though it does not appear infected or acute dehiscence continue with local wound care as below -XR: Expected postop changes -WB Status: Weightbearing as tolerated in postop shoe bilaterally -Sutures: Previously removed -Medications/ABX: No antibiotics indicated - Okay to get bilateral foot wet and wash the feet in the shower with warm soapy water and then dry off apply small amount of Betadine ointment to right hallux amputation site and then apply Band-Aid style dressing.  Change daily        Maridee Shoemaker, DPM [facility] / [facility]

## 2023-06-20 ENCOUNTER — Telehealth (HOSPITAL_BASED_OUTPATIENT_CLINIC_OR_DEPARTMENT_OTHER): Payer: Self-pay | Admitting: *Deleted

## 2023-06-20 DIAGNOSIS — Z79891 Long term (current) use of opiate analgesic: Secondary | ICD-10-CM | POA: Insufficient documentation

## 2023-06-20 DIAGNOSIS — M4727 Other spondylosis with radiculopathy, lumbosacral region: Secondary | ICD-10-CM | POA: Insufficient documentation

## 2023-06-20 DIAGNOSIS — Z79899 Other long term (current) drug therapy: Secondary | ICD-10-CM | POA: Insufficient documentation

## 2023-06-20 NOTE — Telephone Encounter (Signed)
   Primary Cardiologist: Olinda Bertrand, DO  Chart reviewed as part of pre-operative protocol coverage. Given past medical history and time since last visit, based on ACC/AHA guidelines, Carmen Armstrong would be at acceptable risk for the planned procedure without further cardiovascular testing.   Patient was advised that if she develops new symptoms prior to surgery to contact our office to arrange a follow-up appointment. She verbalized understanding.  Per office protocol, patient can hold Eliquis  for 2 days prior to procedure.   I will route this recommendation to the requesting party via Epic fax function and remove from pre-op pool.  Please call with questions.  [name], NP-C  06/20/2023, 4:33 PM [address], [address] [place], [place] [address] Office [phone] Fax [phone]

## 2023-06-20 NOTE — Telephone Encounter (Addendum)
 Patient with diagnosis of A Fib on Eliquis  for anticoagulation.    Procedure: colonoscopy  Date of procedure: 07/10/23   CHA2DS2-VASc Score = 4   This indicates a 4.8% annual risk of stroke. The patient's score is based upon: CHF History: 0 HTN History: 1 Diabetes History: 1 Stroke History: 0 Vascular Disease History: 1 Age Score: 0 Gender Score: 1    CrCl 134 ml/min Platelet count 391K   Per office protocol, patient can hold Eliquis  for 2 days prior to procedure.    **This guidance is not considered finalized until pre-operative APP has relayed final recommendations.**

## 2023-06-20 NOTE — Telephone Encounter (Signed)
   Pre-operative Risk Assessment    Patient Name: Carmen Armstrong  DOB: 05/16/1965 MRN: [id number]   Date of last office visit: 05/02/2023 Date of next office visit: None  Request for Surgical Clearance    Procedure:   Colonoscopy   Date of Surgery:  Clearance 07/10/23                                 Surgeon:  Dr. Alvis Jourdain Surgeon's Group or Practice Name:  [facility] Phone number:  [phone] Fax number:  [phone]   Type of Clearance Requested:   - Medical  - Pharmacy:  Hold Apixaban  (Eliquis ) Not Indicated   Type of Anesthesia:   Propofol    Additional requests/questions:    Signed, Lauris Port   06/20/2023, 2:55 PM

## 2023-06-21 ENCOUNTER — Other Ambulatory Visit: Payer: Self-pay | Admitting: Cardiology

## 2023-07-03 ENCOUNTER — Ambulatory Visit (INDEPENDENT_AMBULATORY_CARE_PROVIDER_SITE_OTHER): Admitting: Podiatry

## 2023-07-03 DIAGNOSIS — Z89422 Acquired absence of other left toe(s): Secondary | ICD-10-CM

## 2023-07-03 DIAGNOSIS — S98111A Complete traumatic amputation of right great toe, initial encounter: Secondary | ICD-10-CM | POA: Diagnosis not present

## 2023-07-03 MED ORDER — PREGABALIN 100 MG PO CAPS: 100.0000 mg | ORAL_CAPSULE | Freq: Three times a day (TID) | ORAL | 2 refills | Status: DC

## 2023-07-03 NOTE — Progress Notes (Signed)
 Subjective:  Patient ID: Carmen Armstrong, female    DOB: April 10, 1965,  MRN: [id number]  DOS: 05/22/2023 Procedure: 1. Amputation of great toe through IPJ, RIGHT foot 2. Amputation of second toe through proximal phalanx head, LEFT foot  58 y.o. female seen for post op check.  6 weeks postoperative.  Having a lot of pain and swelling in the right foot.  She is concerned about the right great toe amputation.  Also slightly concerned about her second toe which is more swollen and darker than prior.  She says there was some drainage from the right hallux however it is decreased now.  Review of Systems: Negative except as noted in the HPI. Denies N/V/F/Ch.   Objective:   There were no vitals filed for this visit.  There is no height or weight on file to calculate BMI. Constitutional Well developed. Well nourished.  Vascular Foot warm and well perfused. Capillary refill normal to all digits.   No calf pain with palpation  Neurologic Normal speech. Oriented to person, place, and time. Epicritic sensation diminished bilateral foot  Dermatologic Left second toe amputation fully healed without pain  Right hallux amputation site with some dry eschar and hyperkeratotic tissue however no open wound or drainage identified.  Some mild dark discoloration of the skin of the residual hallux but no acute erythema noted.  Edema of the right foot and ankle. Right second toe has some edema and dark discoloration as well without any open wound or skin breakdown     Orthopedic: Status post right hallux amputation IPJ level and left second toe amputation PIPJ level   Radiographs: Left foot: There is been interval amputation of the second middle and distal phalanges as well as a portion of the proximal phalanx distally. Prior amputation of the first and fifth toes is noted. Right foot: There has been interval amputation of the first distal phalanx.   Pathology: A. TOE, LEFT SECOND, AMPUTATION:        Skin and subcutaneous tissue with chronic inflammation and  fibrosis.      Bone with marked reactive changes.       No evidence of acute osteomyelitis.       Surgical resection margin is viable without necrosis or  osteomyelitis.   B. TOE, RIGHT GREAT, AMPUTATION:       Chronic osteomyelitis.       Subcutaneous tissue with abscess formation.       Surgical resection margin is viable without necrosis or acute osteomyelitis.   Micro: STAPHYLOCOCCUS WARNERI   Assessment:   Osteomyelitis right hallux distal phalanx and left second toe distal phalanx status post amputation partially of both toes  Plan:  Patient was evaluated and treated and all questions answered.   6 wk s/p right hallux partial amputation and left second toe partial amputation -Progressing as expected postoperatively, amp sites healing well, no evidence of acute infection on the right hallux amputation site -Left second toe amputation site fully healed without pain or other issues -Discussed that her pain on the right great toe and some the swelling is likely due to vascular changes/PAD versus nerve pain -E Rx for Lyrica 100 mg 3 times daily which is what she was on previously.  She said that it helped her at the time. -Continue to keep the right great toe dry and apply antibiotic ointment and Band-Aid as needed.  If there is worsening drainage or the wound opens up she should call to get back in sooner -XR: Deferred today  will consider next appointment if her worsening -WB Status: Weightbearing as tolerated in postop shoe to right foot on the left side she can wear regular shoe -Sutures: Previously removed -Medications/ABX: No antibiotics indicated -Follow-up in 3 weeks for recheck        Maridee Shoemaker, DPM [facility] / [facility]

## 2023-07-05 ENCOUNTER — Telehealth: Payer: Self-pay | Admitting: Podiatry

## 2023-07-05 ENCOUNTER — Encounter: Payer: Self-pay | Admitting: Podiatry

## 2023-07-05 ENCOUNTER — Ambulatory Visit

## 2023-07-05 ENCOUNTER — Ambulatory Visit (INDEPENDENT_AMBULATORY_CARE_PROVIDER_SITE_OTHER)

## 2023-07-05 ENCOUNTER — Ambulatory Visit: Admitting: Podiatry

## 2023-07-05 VITALS — BP 138/60

## 2023-07-05 DIAGNOSIS — Z89422 Acquired absence of other left toe(s): Secondary | ICD-10-CM | POA: Diagnosis not present

## 2023-07-05 DIAGNOSIS — M7989 Other specified soft tissue disorders: Secondary | ICD-10-CM

## 2023-07-05 DIAGNOSIS — S98111A Complete traumatic amputation of right great toe, initial encounter: Secondary | ICD-10-CM

## 2023-07-05 DIAGNOSIS — M79674 Pain in right toe(s): Secondary | ICD-10-CM | POA: Diagnosis not present

## 2023-07-05 MED ORDER — AMOXICILLIN-POT CLAVULANATE 875-125 MG PO TABS: 1.0000 | ORAL_TABLET | Freq: Two times a day (BID) | ORAL | 0 refills | Status: DC

## 2023-07-05 NOTE — Progress Notes (Unsigned)
 Subjective: Chief Complaint  Patient presents with   Post-op Problem    Amputation of the right foot great toe IPJ. IDDM A1C 6.3. 58 pain   58 year old female presents the office today for an acute appointment given concerns of swelling.  She noticed increased warmth of the right second toe.  She does not report any open lesions or any drainage.  She is concerned because this is how the left foot started and she needed to have amputation of the left second toe after big toe was amputated.  She currently does not report any fevers or chills.  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  Objective: AAO x3, NAD Feet are warm and well-perfused.  Incisions from prior surgery are healing well.  There does appear to be increased edema present at the right second toe but there is no erythema or warmth.  There is no fluctuation or crepitation.  There is no malodor.  There is no severe pain.  There is chronic pitting edema present bilaterally to the lower extremities. No pain with calf compression, swelling, warmth, erythema  Assessment: Right second toe swelling, recent toe amputation bilaterally which is healing, pitting edema present bilateral lower extremities  Plan: - X-rays were obtained reviewed of the right foot.  In particular the second toe there is no cortical changes suggest osteomyelitis.  No soft tissue edema.  Recent hallux amputation present with well-defined margins.  Digital contractures present. - Unfortunately do think that the right second toe swelling due to increased pressure given the hallux amputation.  Dispensed offloading to decrease the pressure.  Minute surgical shoe.  I am also concerned that the pain edema present bilaterally and recommend her to follow-up with her PCP to help the swelling - Prescribe Augmentin  given increased swelling -Monitor for any clinical signs or symptoms of infection and directed  to call the office immediately should any occur or go to the ER.  Follow-up as scheduled or sooner if any issues are to arise.  Charity Conch DPM      Right 2nd toe swelling  Still has abx, did not take them all before

## 2023-07-05 NOTE — Telephone Encounter (Signed)
 Dr. Clydia Dart you requested to place patient on your 4:15 schedule for today, however there is no 4:15 slot available on your schedule. She is still on extended schedule for 6:00 today. Please message me if there is anything else I can do for the patient.

## 2023-07-05 NOTE — Telephone Encounter (Signed)
 Patient recently had surgery with Dr. Rosemarie Conquest,  is experiencing swelling in both legs and right foot is not healing. Patient would like to speak to provider as soon as possible.

## 2023-07-10 ENCOUNTER — Ambulatory Visit (INDEPENDENT_AMBULATORY_CARE_PROVIDER_SITE_OTHER): Admitting: Podiatry

## 2023-07-10 DIAGNOSIS — Z89422 Acquired absence of other left toe(s): Secondary | ICD-10-CM

## 2023-07-10 DIAGNOSIS — M7989 Other specified soft tissue disorders: Secondary | ICD-10-CM

## 2023-07-10 DIAGNOSIS — S98111A Complete traumatic amputation of right great toe, initial encounter: Secondary | ICD-10-CM

## 2023-07-10 DIAGNOSIS — M79674 Pain in right toe(s): Secondary | ICD-10-CM

## 2023-07-10 NOTE — Progress Notes (Signed)
 Subjective:  Patient ID: Carmen Armstrong, female    DOB: 17-Jun-1965,  MRN: [id number]  DOS: 05/22/2023 Procedure: 1. Amputation of great toe through IPJ, RIGHT foot 2. Amputation of second toe through proximal phalanx head, LEFT foot  58 y.o. female seen for post op check.  7 weeks postoperative.   She states the pain and swelling now decreased after seeing Dr. Mabel Savage last week.  Has noticed some debris falling off the right great toe.  Does have some pain in the right second toe at the end of the toe due to pressure on it but was given a gel toe/spacer which is helping.  Also taking Lyrica which she says is helping  Review of Systems: Negative except as noted in the HPI. Denies N/V/F/Ch.   Objective:   There were no vitals filed for this visit.  There is no height or weight on file to calculate BMI. Constitutional Well developed. Well nourished.  Vascular Foot warm and well perfused. Capillary refill normal to all digits.   No calf pain with palpation  Neurologic Normal speech. Oriented to person, place, and time. Epicritic sensation diminished bilateral foot  Dermatologic Left second toe amputation fully healed without pain  Right hallux amputation site fully healed with some eschar and hyperkeratotic tissue.  Right second toe with hyperkeratotic tissue distally without evidence of ulceration there is pain on palpation the second toenail is dystrophic and thickened.  Pain with palpation of the distal tuft of the second toe.  No drainage or open wound on either toe.   Orthopedic: Status post right hallux amputation IPJ level and left second toe amputation PIPJ level   Radiographs: Left foot: There is been interval amputation of the second middle and distal phalanges as well as a portion of the proximal phalanx distally. Prior amputation of the first and fifth toes is noted. Right foot: There has been interval amputation of the first distal phalanx.   Pathology: A. TOE, LEFT  SECOND, AMPUTATION:       Skin and subcutaneous tissue with chronic inflammation and  fibrosis.      Bone with marked reactive changes.       No evidence of acute osteomyelitis.       Surgical resection margin is viable without necrosis or  osteomyelitis.   B. TOE, RIGHT GREAT, AMPUTATION:       Chronic osteomyelitis.       Subcutaneous tissue with abscess formation.       Surgical resection margin is viable without necrosis or acute osteomyelitis.   Micro: STAPHYLOCOCCUS WARNERI   Assessment:   Osteomyelitis right hallux distal phalanx and left second toe distal phalanx status post amputation partially of both toes  Plan:  Patient was evaluated and treated and all questions answered.   7 wk s/p right hallux partial amputation and left second toe partial amputation -Progressing as expected postoperatively, amp sites now fully healed -Debrided some hyperkeratotic callus tissue at the distal aspect of the right hallux amputation site as well as the distal tuft of the right second toe which I think is causing most of her pain -Left second toe amputation site fully healed without pain or other issues - Continue pain control with narcotic pain medication versus Lyrica as previously prescribed -XR: Deferred today was done 07/05/2023 without evidence of osseous erosions or infection in the right proximal phalanx of the hallux -For wound care I recommend a small amount of antibiotic ointment to the distal tuft of the right hallux amputation site  in the right second toe tuft where there is callus buildup to soften the area and prevent pain.  Apply Band-Aid during the day and remove at night.  Change daily -WB Status: Weightbearing as tolerated in regular shoe bilaterally -Sutures: Previously removed -Medications/ABX: No antibiotics indicated -Follow-up in 4 weeks for recheck        Maridee Shoemaker, DPM [facility] / [facility]

## 2023-07-12 ENCOUNTER — Emergency Department (HOSPITAL_COMMUNITY)
Admission: EM | Admit: 2023-07-12 | Discharge: 2023-07-12 | Disposition: A | Discharge status: Home / Self Care | Attending: Emergency Medicine | Admitting: Emergency Medicine

## 2023-07-12 ENCOUNTER — Emergency Department (HOSPITAL_COMMUNITY)

## 2023-07-12 ENCOUNTER — Other Ambulatory Visit: Payer: Self-pay

## 2023-07-12 DIAGNOSIS — S0083XA Contusion of other part of head, initial encounter: Secondary | ICD-10-CM | POA: Diagnosis not present

## 2023-07-12 DIAGNOSIS — E119 Type 2 diabetes mellitus without complications: Secondary | ICD-10-CM | POA: Diagnosis not present

## 2023-07-12 DIAGNOSIS — S0012XA Contusion of left eyelid and periocular area, initial encounter: Secondary | ICD-10-CM | POA: Diagnosis not present

## 2023-07-12 DIAGNOSIS — W19XXXA Unspecified fall, initial encounter: Secondary | ICD-10-CM | POA: Insufficient documentation

## 2023-07-12 DIAGNOSIS — Z7901 Long term (current) use of anticoagulants: Secondary | ICD-10-CM | POA: Insufficient documentation

## 2023-07-12 DIAGNOSIS — Z794 Long term (current) use of insulin: Secondary | ICD-10-CM | POA: Insufficient documentation

## 2023-07-12 DIAGNOSIS — Z79899 Other long term (current) drug therapy: Secondary | ICD-10-CM | POA: Insufficient documentation

## 2023-07-12 DIAGNOSIS — I10 Essential (primary) hypertension: Secondary | ICD-10-CM | POA: Diagnosis not present

## 2023-07-12 DIAGNOSIS — S0993XA Unspecified injury of face, initial encounter: Secondary | ICD-10-CM | POA: Diagnosis present

## 2023-07-12 DIAGNOSIS — M25572 Pain in left ankle and joints of left foot: Secondary | ICD-10-CM | POA: Diagnosis not present

## 2023-07-12 LAB — CBC
HCT: 38.3 % (ref 36.0–46.0)
Hemoglobin: 11.6 g/dL — ABNORMAL LOW (ref 12.0–15.0)
MCH: 24.5 pg — ABNORMAL LOW (ref 26.0–34.0)
MCHC: 30.3 g/dL (ref 30.0–36.0)
MCV: 81 fL (ref 80.0–100.0)
Platelets: 226 10*3/uL (ref 150–400)
RBC: 4.73 MIL/uL (ref 3.87–5.11)
RDW: 18.2 % — ABNORMAL HIGH (ref 11.5–15.5)
WBC: 8.2 10*3/uL (ref 4.0–10.5)
nRBC: 0 % (ref 0.0–0.2)

## 2023-07-12 LAB — BASIC METABOLIC PANEL WITH GFR
Anion gap: 9 (ref 5–15)
BUN: 5 mg/dL — ABNORMAL LOW (ref 6–20)
CO2: 28 mmol/L (ref 22–32)
Calcium: 9.3 mg/dL (ref 8.9–10.3)
Chloride: 102 mmol/L (ref 98–111)
Creatinine, Ser: 0.78 mg/dL (ref 0.44–1.00)
GFR, Estimated: >60 mL/min (ref >60)
Glucose, Bld: 113 mg/dL — ABNORMAL HIGH (ref 70–99)
Potassium: 3.8 mmol/L (ref 3.5–5.1)
Sodium: 139 mmol/L (ref 135–145)

## 2023-07-12 LAB — I-STAT ARTERIAL BLOOD GAS, ED
Acid-Base Excess: 3 mmol/L — ABNORMAL HIGH (ref 0.0–2.0)
Bicarbonate: 28 mmol/L (ref 20.0–28.0)
Calcium, Ion: 1.27 mmol/L (ref 1.15–1.40)
HCT: 35 % — ABNORMAL LOW (ref 36.0–46.0)
Hemoglobin: 11.9 g/dL — ABNORMAL LOW (ref 12.0–15.0)
O2 Saturation: 90 %
Patient temperature: 98.3
Potassium: 3.6 mmol/L (ref 3.5–5.1)
Sodium: 141 mmol/L (ref 135–145)
TCO2: 29 mmol/L (ref 22–32)
pCO2 arterial: 42.8 mmHg (ref 32–48)
pH, Arterial: 7.423 (ref 7.35–7.45)
pO2, Arterial: 58 mmHg — ABNORMAL LOW (ref 83–108)

## 2023-07-12 LAB — RAPID URINE DRUG SCREEN, HOSP PERFORMED
Amphetamines: NOT DETECTED
Barbiturates: NOT DETECTED
Benzodiazepines: NOT DETECTED
Cocaine: NOT DETECTED
Opiates: NOT DETECTED
Tetrahydrocannabinol: NOT DETECTED

## 2023-07-12 LAB — CBG MONITORING, ED: Glucose-Capillary: 112 mg/dL — ABNORMAL HIGH (ref 70–99)

## 2023-07-12 NOTE — ED Triage Notes (Signed)
 Arrived via ems.  States she was goingout  andtripped falling on face.  Abrasion and small amt of swelling noted to bridge of nose.  She also states has chronic pain in rt knee and hip.  Rt knee is the most painful at present  6/10.  Recent amputatione to some of toes both feet

## 2023-07-12 NOTE — Discharge Instructions (Addendum)
 Please use crutches as needed to walk There are no evidence of broken bones seen in your ankle on the left or hip on the right.  Head CT and neck and face without evidence of acute abnormalities There is a pulmonary nodule noted in the upper lobe of the left lung.  This needs follow-up in 1 year.  Please make sure your primary care provider is aware of this abnormality.

## 2023-07-12 NOTE — ED Provider Notes (Addendum)
 [place] EMERGENCY DEPARTMENT AT [facility] Provider Note   CSN: [id number] Arrival date & time: 07/12/23  1258     History  Chief Complaint  Patient presents with   Carmen Armstrong    Carmen Armstrong is a 58 y.o. female.  HPI  58 year old female fall on blood thinners with trauma to face.  History is difficult to obtain as patient is having intermittent nonresponsiveness, garbled speech, then clearing.    Home Medications Prior to Admission medications   Medication Sig Start Date End Date Taking? Authorizing Provider  AIRSUPRA 90-80 MCG/ACT AERO Inhale 2 puffs into the lungs in the morning and at bedtime. 04/23/23  Yes [provider]  amLODipine  (NORVASC ) 10 MG tablet Take 1 tablet (10 mg total) by mouth daily. Patient taking differently: Take 10 mg by mouth at bedtime. 03/22/23  Yes Tolia, Sunit, DO  amoxicillin -clavulanate (AUGMENTIN ) 875-125 MG tablet Take 1 tablet by mouth 2 (two) times daily. 07/05/23  Yes Charity Conch, DPM  atorvastatin  (LIPITOR) 40 MG tablet Take 40 mg by mouth at bedtime. 08/08/19  Yes [provider]  BELBUCA 150 MCG FILM Take 1 Film by mouth every 12 (twelve) hours. 07/02/23  Yes [provider]  buPROPion  (WELLBUTRIN  XL) 300 MG 24 hr tablet Take 300 mg by mouth every morning. 04/03/22  Yes [provider]  candesartan (ATACAND) 4 MG tablet Take 4 mg by mouth daily. 08/13/19  Yes [provider]  colchicine 0.6 MG tablet Take 0.6-1.2 mg by mouth See admin instructions. Take 1.2 mg for the first initial dose then 0.6 mg daily. 05/14/23  Yes [provider]  ELIQUIS  5 MG TABS tablet Take 1 tablet (5 mg total) by mouth 2 (two) times daily. 05/02/23  Yes Tolia, Sunit, DO  FLUoxetine  (PROZAC ) 20 MG capsule Take 20 mg by mouth daily. 04/04/22  Yes [provider]  fluticasone-salmeterol (ADVAIR) 250-50 MCG/ACT AEPB Inhale 1 puff into the lungs 2 (two) times daily. 05/04/23  Yes [provider]  insulin  aspart (NOVOLOG  FLEXPEN) 100 UNIT/ML FlexPen Inject 10-20 Units into the skin 3 (three) times daily with meals. Patient taking differently: Inject 10 Units into the skin 3 (three) times daily with meals. 04/16/23  Yes Shamleffer, Ibtehal Jaralla, MD  LINZESS  145 MCG CAPS capsule Take 145 mcg by mouth daily. 04/03/22  Yes [provider]  methimazole  (TAPAZOLE ) 5 MG tablet Take 1 tablet (5 mg total) by mouth as directed. 1 tablet Monday through Saturday, none on Sundays 03/20/23  Yes Shamleffer, Ibtehal Jaralla, MD  metoprolol  succinate (TOPROL -XL) 100 MG 24 hr tablet Take 100 mg by mouth daily. 06/12/19  Yes [provider]  naloxone  (NARCAN ) nasal spray 4 mg/0.1 mL Place 1 spray into the nose as needed (OD).   Yes [provider]  omeprazole (PRILOSEC) 20 MG capsule Take 20 mg by mouth daily. 06/15/21  Yes [provider]  pregabalin  (LYRICA ) 100 MG capsule Take 1 capsule (100 mg total) by mouth 3 (three) times daily. 07/03/23 10/01/23 Yes Standiford, Karlene Overcast, DPM  spironolactone  (ALDACTONE ) 25 MG tablet TAKE 1 TABLET BY MOUTH DAILY 06/22/23  Yes Tolia, Sunit, DO  tirzepatide  (MOUNJARO ) 10 MG/0.5ML Pen Inject 10 mg into the skin once a week. 04/13/23  Yes Shamleffer, Julian Obey, MD  TRESIBA  FLEXTOUCH 100 UNIT/ML FlexTouch Pen Inject 64 Units into the skin daily.   Yes [provider]  XIGDUO  XR 11-998 MG TB24 Take 2 tablets by mouth daily. 05/01/23  Yes [provider]  zolpidem  (AMBIEN  CR) 6.25 MG CR tablet Take 12.5 mg by mouth at bedtime. 12/26/22  Yes [provider]      Allergies    Other, Corylus, Omeprazole, Pear, Penicillin g, and Trulicity [dulaglutide]    Review of Systems   Review of Systems  Physical Exam Updated Vital Signs BP (!) 172/84   Pulse 77   Temp 98.2 F (36.8 C) (Oral)   Resp 17   SpO2 95%  Physical Exam Vitals reviewed.  Constitutional:      General: She is not in acute  distress.    Appearance: She is not ill-appearing.  HENT:     Head: Normocephalic.     Comments: Contusion left periorbital area    Left Ear: External ear normal.     Nose: Nose normal.     Mouth/Throat:     Pharynx: Oropharynx is clear.  Eyes:     Comments: Pinpoint pupils bilaterally  Cardiovascular:     Rate and Rhythm: Normal rate and regular rhythm.     Pulses: Normal pulses.     Heart sounds: Normal heart sounds.  Pulmonary:     Effort: Pulmonary effort is normal.  Abdominal:     Palpations: Abdomen is soft.  Musculoskeletal:     Cervical back: Normal range of motion.     Comments: Pain with palpation right hip, no obvious deformity  Neurological:     General: No focal deficit present.     ED Results / Procedures / Treatments   Labs (all labs ordered are listed, but only abnormal results are displayed) Labs Reviewed  CBC - Abnormal; Notable for the following components:      Result Value   Hemoglobin 11.6 (*)    MCH 24.5 (*)    RDW 18.2 (*)    All other components within normal limits  BASIC METABOLIC PANEL WITH GFR - Abnormal; Notable for the following components:   Glucose, Bld 113 (*)    BUN 5 (*)    All other components within normal limits  CBG MONITORING, ED - Abnormal; Notable for the following components:   Glucose-Capillary 112 (*)    All other components within normal limits  I-STAT ARTERIAL BLOOD GAS, ED - Abnormal; Notable for the following components:   pO2, Arterial 58 (*)    Acid-Base Excess 3.0 (*)    HCT 35.0 (*)    Hemoglobin 11.9 (*)    All other components within normal limits  RAPID URINE DRUG SCREEN, HOSP PERFORMED    EKG None  Radiology CT Head Wo Contrast Result Date: 07/12/2023 CLINICAL DATA:  Provided history: Facial trauma, blunt. Head trauma, focal neuro findings. Neck trauma, dangerous injury mechanism. Bruising/abrasion to bridge of nose. EXAM: CT HEAD WITHOUT CONTRAST CT MAXILLOFACIAL WITHOUT CONTRAST CT CERVICAL SPINE  WITHOUT CONTRAST TECHNIQUE: Multidetector CT imaging of the head, cervical spine, and maxillofacial structures were performed using the standard protocol without intravenous contrast. Multiplanar CT image reconstructions of the cervical spine and maxillofacial structures were also generated. RADIATION DOSE REDUCTION: This exam was performed according to the departmental dose-optimization program which includes automated exposure control, adjustment of the mA and/or kV according to patient size and/or use of iterative reconstruction technique. COMPARISON:  None. FINDINGS: CT HEAD FINDINGS Brain: Mild generalized cerebral atrophy. There is no acute intracranial hemorrhage. No demarcated cortical infarct. No extra-axial fluid collection. No evidence of an intracranial mass. No midline shift. Vascular: No hyperdense vessel.  Atherosclerotic calcifications. Skull: No calvarial fracture or  aggressive osseous lesion. CT MAXILLOFACIAL FINDINGS Osseous: No acute maxillofacial fracture is identified. The patient is edentulous. Orbits: Bilateral proptosis. Globes/orbits otherwise unremarkable. Sinuses: No significant paranasal sinus disease. Soft tissues: No maxillofacial hematoma appreciable by CT. CT CERVICAL SPINE FINDINGS Alignment: Dextrocurvature of the cervical spine. Slight grade 1 retrolisthesis at C4-C5 and C5-C6. Slight grade 1 anterolisthesis at C7-T1. Skull base and vertebrae: The basion-dental and atlanto-dental intervals are maintained.No evidence of acute fracture to the cervical spine. Soft tissues and spinal canal: No prevertebral fluid or swelling. No visible canal hematoma. Disc levels: Cervical spondylosis with multilevel disc space narrowing, disc bulges/central disc protrusions, endplate spurring and uncovertebral hypertrophy. Disc space narrowing is greatest at C3-C4 (moderate to advanced), C4-C5 (advanced), C5-C6 (moderate to advanced) and C6-C7 (moderate to advanced). Multilevel spinal canal stenosis.  Most notably at C4-C5, a partially calcified central disc protrusion contributes to at least moderate spinal canal stenosis. Multilevel bony neural foraminal narrowing. Multilevel ventral osteophytes. Degenerative changes also present at the C1-C2 articulation. Upper chest: No visible pneumothorax. 5 mm left upper lobe pulmonary nodule (series 5, image 87). IMPRESSION: CT head: 1.  No acute intracranial finding. 2. Mild generalized cerebral atrophy. CT maxillofacial: 1. No evidence of an acute maxillofacial fracture. 2. Bilateral proptosis. CT cervical spine: 1. No evidence of an acute cervical spine fracture. 2. Mild grade 1 spondylolisthesis at C4-C5, C5-C6 and C7-T1. 3. Dextrocurvature of the cervical spine. 4. Cervical spondylosis as described within the body of the report. Multilevel spinal canal stenosis. Most notably at C4-C5, a partially calcified central disc protrusion contributes to at least moderate spinal canal stenosis. Multilevel bony neural foraminal narrowing. 5. 5 mm left upper lobe pulmonary nodule. A follow-up chest CT in 12 months is considered optional for high-risk patients. No follow-up imaging is required if patient is low-risk. This recommendation follows the consensus statement: Guidelines for Management of Incidental Pulmonary Nodules Detected on CT Images: From the Fleischner Society 2017; Radiology 2017; 284:228-243. Electronically Signed   By: Bascom Lily D.O.   On: 07/12/2023 16:36   CT Cervical Spine Wo Contrast Result Date: 07/12/2023 CLINICAL DATA:  Provided history: Facial trauma, blunt. Head trauma, focal neuro findings. Neck trauma, dangerous injury mechanism. Bruising/abrasion to bridge of nose. EXAM: CT HEAD WITHOUT CONTRAST CT MAXILLOFACIAL WITHOUT CONTRAST CT CERVICAL SPINE WITHOUT CONTRAST TECHNIQUE: Multidetector CT imaging of the head, cervical spine, and maxillofacial structures were performed using the standard protocol without intravenous contrast. Multiplanar CT  image reconstructions of the cervical spine and maxillofacial structures were also generated. RADIATION DOSE REDUCTION: This exam was performed according to the departmental dose-optimization program which includes automated exposure control, adjustment of the mA and/or kV according to patient size and/or use of iterative reconstruction technique. COMPARISON:  None. FINDINGS: CT HEAD FINDINGS Brain: Mild generalized cerebral atrophy. There is no acute intracranial hemorrhage. No demarcated cortical infarct. No extra-axial fluid collection. No evidence of an intracranial mass. No midline shift. Vascular: No hyperdense vessel.  Atherosclerotic calcifications. Skull: No calvarial fracture or aggressive osseous lesion. CT MAXILLOFACIAL FINDINGS Osseous: No acute maxillofacial fracture is identified. The patient is edentulous. Orbits: Bilateral proptosis. Globes/orbits otherwise unremarkable. Sinuses: No significant paranasal sinus disease. Soft tissues: No maxillofacial hematoma appreciable by CT. CT CERVICAL SPINE FINDINGS Alignment: Dextrocurvature of the cervical spine. Slight grade 1 retrolisthesis at C4-C5 and C5-C6. Slight grade 1 anterolisthesis at C7-T1. Skull base and vertebrae: The basion-dental and atlanto-dental intervals are maintained.No evidence of acute fracture to the cervical spine. Soft tissues and spinal canal: No  prevertebral fluid or swelling. No visible canal hematoma. Disc levels: Cervical spondylosis with multilevel disc space narrowing, disc bulges/central disc protrusions, endplate spurring and uncovertebral hypertrophy. Disc space narrowing is greatest at C3-C4 (moderate to advanced), C4-C5 (advanced), C5-C6 (moderate to advanced) and C6-C7 (moderate to advanced). Multilevel spinal canal stenosis. Most notably at C4-C5, a partially calcified central disc protrusion contributes to at least moderate spinal canal stenosis. Multilevel bony neural foraminal narrowing. Multilevel ventral  osteophytes. Degenerative changes also present at the C1-C2 articulation. Upper chest: No visible pneumothorax. 5 mm left upper lobe pulmonary nodule (series 5, image 87). IMPRESSION: CT head: 1.  No acute intracranial finding. 2. Mild generalized cerebral atrophy. CT maxillofacial: 1. No evidence of an acute maxillofacial fracture. 2. Bilateral proptosis. CT cervical spine: 1. No evidence of an acute cervical spine fracture. 2. Mild grade 1 spondylolisthesis at C4-C5, C5-C6 and C7-T1. 3. Dextrocurvature of the cervical spine. 4. Cervical spondylosis as described within the body of the report. Multilevel spinal canal stenosis. Most notably at C4-C5, a partially calcified central disc protrusion contributes to at least moderate spinal canal stenosis. Multilevel bony neural foraminal narrowing. 5. 5 mm left upper lobe pulmonary nodule. A follow-up chest CT in 12 months is considered optional for high-risk patients. No follow-up imaging is required if patient is low-risk. This recommendation follows the consensus statement: Guidelines for Management of Incidental Pulmonary Nodules Detected on CT Images: From the Fleischner Society 2017; Radiology 2017; 284:228-243. Electronically Signed   By: Bascom Lily D.O.   On: 07/12/2023 16:36   CT Maxillofacial Wo Contrast Result Date: 07/12/2023 CLINICAL DATA:  Provided history: Facial trauma, blunt. Head trauma, focal neuro findings. Neck trauma, dangerous injury mechanism. Bruising/abrasion to bridge of nose. EXAM: CT HEAD WITHOUT CONTRAST CT MAXILLOFACIAL WITHOUT CONTRAST CT CERVICAL SPINE WITHOUT CONTRAST TECHNIQUE: Multidetector CT imaging of the head, cervical spine, and maxillofacial structures were performed using the standard protocol without intravenous contrast. Multiplanar CT image reconstructions of the cervical spine and maxillofacial structures were also generated. RADIATION DOSE REDUCTION: This exam was performed according to the departmental  dose-optimization program which includes automated exposure control, adjustment of the mA and/or kV according to patient size and/or use of iterative reconstruction technique. COMPARISON:  None. FINDINGS: CT HEAD FINDINGS Brain: Mild generalized cerebral atrophy. There is no acute intracranial hemorrhage. No demarcated cortical infarct. No extra-axial fluid collection. No evidence of an intracranial mass. No midline shift. Vascular: No hyperdense vessel.  Atherosclerotic calcifications. Skull: No calvarial fracture or aggressive osseous lesion. CT MAXILLOFACIAL FINDINGS Osseous: No acute maxillofacial fracture is identified. The patient is edentulous. Orbits: Bilateral proptosis. Globes/orbits otherwise unremarkable. Sinuses: No significant paranasal sinus disease. Soft tissues: No maxillofacial hematoma appreciable by CT. CT CERVICAL SPINE FINDINGS Alignment: Dextrocurvature of the cervical spine. Slight grade 1 retrolisthesis at C4-C5 and C5-C6. Slight grade 1 anterolisthesis at C7-T1. Skull base and vertebrae: The basion-dental and atlanto-dental intervals are maintained.No evidence of acute fracture to the cervical spine. Soft tissues and spinal canal: No prevertebral fluid or swelling. No visible canal hematoma. Disc levels: Cervical spondylosis with multilevel disc space narrowing, disc bulges/central disc protrusions, endplate spurring and uncovertebral hypertrophy. Disc space narrowing is greatest at C3-C4 (moderate to advanced), C4-C5 (advanced), C5-C6 (moderate to advanced) and C6-C7 (moderate to advanced). Multilevel spinal canal stenosis. Most notably at C4-C5, a partially calcified central disc protrusion contributes to at least moderate spinal canal stenosis. Multilevel bony neural foraminal narrowing. Multilevel ventral osteophytes. Degenerative changes also present at the C1-C2 articulation. Upper chest:  No visible pneumothorax. 5 mm left upper lobe pulmonary nodule (series 5, image 87). IMPRESSION:  CT head: 1.  No acute intracranial finding. 2. Mild generalized cerebral atrophy. CT maxillofacial: 1. No evidence of an acute maxillofacial fracture. 2. Bilateral proptosis. CT cervical spine: 1. No evidence of an acute cervical spine fracture. 2. Mild grade 1 spondylolisthesis at C4-C5, C5-C6 and C7-T1. 3. Dextrocurvature of the cervical spine. 4. Cervical spondylosis as described within the body of the report. Multilevel spinal canal stenosis. Most notably at C4-C5, a partially calcified central disc protrusion contributes to at least moderate spinal canal stenosis. Multilevel bony neural foraminal narrowing. 5. 5 mm left upper lobe pulmonary nodule. A follow-up chest CT in 12 months is considered optional for high-risk patients. No follow-up imaging is required if patient is low-risk. This recommendation follows the consensus statement: Guidelines for Management of Incidental Pulmonary Nodules Detected on CT Images: From the Fleischner Society 2017; Radiology 2017; 284:228-243. Electronically Signed   By: Bascom Lily D.O.   On: 07/12/2023 16:36   DG Hip Unilat W or Wo Pelvis 2-3 Views Right Result Date: 07/12/2023 CLINICAL DATA:  Fall and right hip pain. EXAM: DG HIP (WITH OR WITHOUT PELVIS) 2-3V RIGHT COMPARISON:  None Available. FINDINGS: The total right hip arthroplasty. The arthroplasty components appear intact and in anatomic alignment. There is no acute fracture or dislocation. The bones are osteopenic. Moderate arthritic changes of the left hip partially visualized lumbar fusion. The soft tissues are unremarkable. IMPRESSION: 1. No acute fracture or dislocation. 2. Total right hip arthroplasty. Electronically Signed   By: Angus Bark M.D.   On: 07/12/2023 16:24   DG Ankle Left Port Result Date: 07/12/2023 CLINICAL DATA:  Fall and left ankle pain. EXAM: PORTABLE LEFT ANKLE - 2 VIEW COMPARISON:  Left knee radiograph dated 05/28/2023. FINDINGS: No acute fracture or dislocation. The bones are  osteopenic. The ankle mortise is intact. The soft tissues are unremarkable. IMPRESSION: 1. No acute fracture or dislocation. 2. Osteopenia. Electronically Signed   By: Angus Bark M.D.   On: 07/12/2023 16:20    Procedures Procedures    Medications Ordered in ED Medications - No data to display  ED Course/ Medical Decision Making/ A&P Clinical Course as of 07/16/23 1602  Thu Jul 12, 2023  1613 Left ankle x-Tyliek Timberman reviewed interpreted no evidence of acute fracture noted awaiting radiologist interpretation [DR]  1614 Right hip x-Tyreek Clabo reviewed with prosthesis in place [DR]  1654 CT head without any evidence of acute intracranial abnormality noted on my interpretation and radiologist interpretation concurs [DR]  1654 CT cervical spine without evidence of acute cervical spine fracture A 5 mm left upper lobe pulmonary nodule is noted with suggested follow-up CT in 12 months [DR]  1655 CT maxillofacial without acute osseous findings [DR]  1655 Right hip x-Palyn Scrima without any evidence of acute fracture or other acute abnormality status post right hip arthroplasty noted [DR]  1655 Left ankle without any evidence of acute abnormality noted [DR]  1711 Rapid urine drug screen (hospital performed) [DR]    Clinical Course User Index [DR] Auston Blush, MD                                 Medical Decision Making Amount and/or Complexity of Data Reviewed Labs: ordered. Decision-making details documented in ED Course. Radiology: ordered.  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  Patient is intermittently awake alert and oriented but then will refuse or cannot answer questions.  Therefore she had level 2 trauma initiated.  Basic metabolic panel and CBC without acute abnormalities to explain her altered mental status.  Patient with history of respiratory failure and had ABG obtained which has a normal pH, pCO2  normal, and slightly decreased pO2 at 58 which would not explain patient's altered mental status. She did complain of left ankle pain and right hip pain X-Carlisle Enke obtained- obvious fracture 4:57 PM Awaiting radiology interpretation of ct and plain x-rays She is awake and tearful that her electricity is being turned off.  She is trying to call her sister.  She says that she forgot to pay the bill and if her sister can call before 5:00 they will not turn off her power. Patient states she still has pain in her left ankle.  I have reviewed the ankle x-Jahmir Salo and did not see any obvious fracture but awaiting radiologist interpretation.  She is also complaining of pain in her right hip.  These films are also awaiting interpretation I have discussed the patient that the reason she does not appear to have obvious fractures and if x-rays and CT scans are negative that she will be discharged to home.  She has bilateral cam walkers at home.  She states she is having difficulty putting weight on the left ankle and this preceded her fall. Discussed with her that we could give her crutches and she can follow-up as outpatient.         Final Clinical Impression(s) / ED Diagnoses Final diagnoses:  Fall, initial encounter  Contusion of face, initial encounter  Left ankle pain, unspecified chronicity    Rx / DC Orders ED Discharge Orders     None         Auston Blush, MD 07/16/23 1603    Auston Blush, MD 07/16/23 [id number]

## 2023-07-13 ENCOUNTER — Ambulatory Visit: Payer: 59 | Admitting: Internal Medicine

## 2023-07-13 NOTE — Progress Notes (Deleted)
 Name: Carmen Armstrong  MRN/ DOB: [id number], 04-21-1965    Age/ Sex: 58 y.o., female     PCP: Aleta Anda, MD   Reason for Endocrinology Evaluation: Hyperthyroidism     Initial Endocrinology Clinic Visit: 11/05/2019    PATIENT IDENTIFIER: Carmen Armstrong is a 58 y.o., female with a past medical history of HTN, T2DM, PAF , CHF and dyslipidemia. She has followed with [facility] since 11/05/2019 for consultative assistance with management of her Hyperthyroidism.      HISTORICAL SUMMARY:   She was diagnosed with hyperthyroidism in 10/2018 during routine workup . Two months prior to her presentation she was noted with dry eyes. Had occasional double vision, and is under the care of an ophthalmologist.     Pt on Multaq  Since 2020. S/P cardiac ablation  . Has not been on amiodarone since 2021  Thyroid  ultrasound did not reveal any thyroid  nodules 12/2020 TRAb elevated 30.55 (2021)  Methimazole  started 10/2019   Paternal grandmother with thyroid  disease     DM HISTORY: She has been diagnosed with DM > 20 yrs ago.  She is intolerant to Trulicity.  Her A1c has ranged from 8.2% in 2022, peaking at 9.0% in 08/2022  I have assumed her diabetes management 08/2022 per her request  On her initial visit with me we switched Ozempic to Mounjaro .  Continued basal insulin , and adjusted Xigduo   She was prescribed Humalog  to pain management clinic 2024  SUBJECTIVE:   During the last visit (04/13/2023): A1c 13.1%  Today (07/13/23): Carmen Armstrong is here for follow-up with diabetes management and hyperthyroidism. She checks her blood sugars multiple times daily through freestyle libre. The patient has not had hypoglycemic episodes since the last clinic visit.   Since her last visit here she presented to the ED with shortness of breath, was diagnosed with influenza A 04/2023  She presented to the ED/02/2023 with cellulitis of the lower extremity, she is  s/p amputation of the right great toe and the left second toe , she continues to follow-up with podiatry   She is s/p spinal cord stimulator placement 04/2022  Patient continues to follow-up with cardiology for CAD, peripheral artery disease  Denies nausea or vomiting  Has occasional constipation- on linzess  every other day  Denies local neck symptoms  Denies palpitations  Patient is upset because she get discharged from her pain management clinic    HOME DIABETES REGIMEN:  Methimazole  5 mg , 1 tablet Monday through Saturday and none on Sundays Xigduo  06-998 XR, 2 tabs daily Mounjaro  10 mg weekly  Lantus  64 units daily  Novolog  10 units TIDQAC Correction factor : Humalog  (BG-130/30) TIDQAC and QHS   Statin: yes ACE-I/ARB: yes   CONTINUOUS GLUCOSE MONITORING RECORD INTERPRETATION    Dates of Recording:2/15-2/28/2025  Sensor description:dexcom  Results statistics:   CGM use % of time 92  Average and SD 369/50  Time in range 0  %  % Time Above 180 4  % Time above 250 96  % Time Below target 0   Glycemic patterns summary: BG's high through the day and night Hyperglycemic episodes postprandial  Hypoglycemic episodes occurred N/A  Overnight periods: high     DIABETIC COMPLICATIONS: Microvascular complications:  Neuropathy , right great and left second toe amputation 05/2023 Denies:  Last Eye Exam: Completed   Macrovascular complications:  CAD, PVD Denies:  CVA       HISTORY:  Past Medical History:  Past Medical History:  Diagnosis  Date   Arrhythmia    Arthritis    Atrial fibrillation (HCC)    CAD (coronary artery disease)    Chronic lower back pain    Depression    Diabetes mellitus without complication (HCC)    Eczema    GAD (generalized anxiety disorder)    GERD (gastroesophageal reflux disease)    H/O vitamin D deficiency    Heart failure (HCC)    Hyperlipidemia    Hypertension    Hypothyroidism    Insomnia    Peripheral neuropathy     PVD (peripheral vascular disease) (HCC)    Past Surgical History:  Past Surgical History:  Procedure Laterality Date   ABDOMINAL AORTOGRAM W/LOWER EXTREMITY Bilateral 05/21/2023   Procedure: ABDOMINAL AORTOGRAM W/LOWER EXTREMITY;  Surgeon: Philipp Brawn, MD;  Location: [facility];  Service: Cardiovascular;  Laterality: Bilateral;   AMPUTATION Right 05/22/2023   Procedure: AMPUTATION, FOOT, RAY;  Surgeon: Evertt Hoe, DPM;  Location: [facility] OR;  Service: Orthopedics/Podiatry;  Laterality: Right;  Left second toe partial amp   AMPUTATION TOE Left 05/22/2023   Procedure: AMPUTATION, TOE;  Surgeon: Evertt Hoe, DPM;  Location: [facility] OR;  Service: Orthopedics/Podiatry;  Laterality: Left;  Right great toe partial amputation   ANGIOPLASTY     legs   BACK SURGERY     5 different times   BREAST BIOPSY Right 07/24/2022   MM RT BREAST BX W LOC DEV 1ST LESION IMAGE BX SPEC STEREO GUIDE 07/24/2022 [facility]   CARDIAC CATHETERIZATION  2020   REPLACEMENT TOTAL HIP W/  RESURFACING IMPLANTS Right 2018   RIGHT AND LEFT HEART CATH     ROTATOR CUFF REPAIR Right 2016   SPINAL CORD STIMULATOR INSERTION N/A 05/08/2022   Procedure: PLACEMENT OF SPINAL CORD STIMULATOR;  Surgeon: Mort Ards, MD;  Location: [facility];  Service: Orthopedics;  Laterality: N/A;   SPINE SURGERY  2014,2016,2017,2019   toe amputated  09/2018   5th left toe   TOE AMPUTATION Left    great toe on left side   Social History:  reports that she has been smoking cigarettes. She has a 15 pack-year smoking history. She has never used smokeless tobacco. She reports that she does not currently use alcohol. She reports that she does not currently use drugs. Family History:  Family History  Problem Relation Age of Onset   Hypertension Mother    Hyperlipidemia Mother    Diabetes Mother    Stroke Mother    Clotting disorder Father    Hypertension Father    Hyperlipidemia Father    Hyperlipidemia Sister     Diabetes Brother    Neuropathy Brother      HOME MEDICATIONS: Allergies as of 07/13/2023       Reactions   Other Shortness Of Breath   Hazelnuts   Corylus Other (See Comments)   Unknown    Omeprazole Other (See Comments)   Unknown    Pear Other (See Comments)   Unknown    Penicillin G Other (See Comments)   Whelps - childhood allergy   Trulicity [dulaglutide] Other (See Comments)   Large itchy bumps        Medication List        Accurate as of Jul 13, 2023  7:13 AM. If you have any questions, ask your nurse or doctor.          Airsupra 90-80 MCG/ACT Aero Generic drug: Albuterol -Budesonide  Inhale 2 puffs into the lungs in the morning and  at bedtime.   amLODipine  10 MG tablet Commonly known as: NORVASC  Take 1 tablet (10 mg total) by mouth daily. What changed: when to take this   amoxicillin -clavulanate 875-125 MG tablet Commonly known as: AUGMENTIN  Take 1 tablet by mouth 2 (two) times daily.   atorvastatin  40 MG tablet Commonly known as: LIPITOR Take 40 mg by mouth at bedtime.   Belbuca 150 MCG Film Generic drug: Buprenorphine HCl Take 1 Film by mouth every 12 (twelve) hours.   buPROPion  300 MG 24 hr tablet Commonly known as: WELLBUTRIN  XL Take 300 mg by mouth every morning.   candesartan 4 MG tablet Commonly known as: ATACAND Take 4 mg by mouth daily.   colchicine 0.6 MG tablet Take 0.6-1.2 mg by mouth See admin instructions. Take 1.2 mg for the first initial dose then 0.6 mg daily.   Eliquis  5 MG Tabs tablet Generic drug: apixaban  Take 1 tablet (5 mg total) by mouth 2 (two) times daily.   FLUoxetine  20 MG capsule Commonly known as: PROZAC  Take 20 mg by mouth daily.   fluticasone-salmeterol 250-50 MCG/ACT Aepb Commonly known as: ADVAIR Inhale 1 puff into the lungs 2 (two) times daily.   Linzess  145 MCG Caps capsule Generic drug: linaclotide  Take 145 mcg by mouth daily.   methimazole  5 MG tablet Commonly known as: TAPAZOLE  Take 1  tablet (5 mg total) by mouth as directed. 1 tablet Monday through Saturday, none on Sundays   metoprolol  succinate 100 MG 24 hr tablet Commonly known as: TOPROL -XL Take 100 mg by mouth daily.   naloxone  4 MG/0.1ML Liqd nasal spray kit Commonly known as: NARCAN  Place 1 spray into the nose as needed (OD).   NovoLOG  FlexPen 100 UNIT/ML FlexPen Generic drug: insulin  aspart Inject 10-20 Units into the skin 3 (three) times daily with meals. What changed: how much to take   omeprazole 20 MG capsule Commonly known as: PRILOSEC Take 20 mg by mouth daily.   pregabalin 100 MG capsule Commonly known as: Lyrica Take 1 capsule (100 mg total) by mouth 3 (three) times daily.   spironolactone  25 MG tablet Commonly known as: ALDACTONE  TAKE 1 TABLET BY MOUTH DAILY   tirzepatide  10 MG/0.5ML Pen Commonly known as: MOUNJARO  Inject 10 mg into the skin once a week.   Tresiba  FlexTouch 100 UNIT/ML FlexTouch Pen Generic drug: insulin  degludec Inject 64 Units into the skin daily.   Xigduo  XR 11-998 MG Tb24 Generic drug: Dapagliflozin  Pro-metFORMIN  ER Take 2 tablets by mouth daily.   zolpidem  6.25 MG CR tablet Commonly known as: AMBIEN  CR Take 12.5 mg by mouth at bedtime.          OBJECTIVE:   PHYSICAL EXAM: VS: There were no vitals taken for this visit.    EXAM: General:   Neck: General: Supple without adenopathy. Thyroid : no nodules appreciated   Lungs: Clear with good BS bilat  Heart: Auscultation: RRR.  Mental Status: Judgment, insight: Intact Mood and affect: No depression, anxiety, or agitation   DM Foot Exam 07/10/2023  per podiatry      DATA REVIEWED:   Latest Reference Range & Units 12/27/22 08:46  TSH 0.35 - 5.50 uIU/mL 4.56  T4,Free(Direct) 0.60 - 1.60 ng/dL 1.61     Latest Reference Range & Units 12/27/22 09:50  Creatinine,U mg/dL 09.6  Microalb, Ur 0.0 - 1.9 mg/dL 1.3  MICROALB/CREAT RATIO 0.0 - 30.0 mg/g 1.6        Results for ZONA, PEDRO (MRN [id number]) as of 01/26/2020 13:34  Ref. Range 12/17/2019 10:24  TRAB Latest Ref Range: <=2.00 IU/L 30.55 (H)    Thyroid  Ultrasound 01/12/2021  Estimated total number of nodules >/= 1 cm: 0   Number of spongiform nodules >/=  2 cm not described below (TR1): 0   Number of mixed cystic and solid nodules >/= 1.5 cm not described below (TR2): 0   _________________________________________________________   No discrete nodules are seen within the thyroid  gland.   IMPRESSION: 1. Borderline enlarged thyroid  gland without discrete nodule identified.    ASSESSMENT / PLAN / RECOMMENDATIONS:   1) Type 2 Diabetes Mellitus, Poorly controlled, With neuropathic and macrovascular complications - Most recent A1c of 13.1 %. Goal A1c < 7.0 %.    -Patient continues with worsening glycemic control -Will increase Mounjaro  as below, and Tresiba  as below  - She will be provided with a standing prandial dose and correction scale  -MA/CR ratio normal in the past  -Insurance requires Lantus /NovoLog     MEDICATIONS: Continue  Xigduo  06-998 XR, 2 tabs daily Increase Mounjaro  10 mg weekly Increase Lantus  64 units daily Take NovoLog  10 units TIDQAC Continue correction factor : Humalog  (BG-130/30) TIDQAC and QHS    EDUCATION / INSTRUCTIONS: BG monitoring instructions: Patient is instructed to check her  blood sugars 3 times a day. Call [facility] if: BG persistently < 70  I reviewed the Rule of 15 for the treatment of hypoglycemia in detail with the patient. Literature supplied.    2) Diabetic complications:  Eye: Does not have known diabetic retinopathy.  Neuro/ Feet: Does  have known diabetic peripheral neuropathy .  Renal: Patient does not have known baseline CKD. She   is  on an ACEI/ARB at present.    3)Hyperthyroidism Secondary to Graves' Disease:    - Pt is clinically euthyroid  - No local neck symptoms -She was supposed to decrease her methimazole  to 6  days a week, but she continues to take it daily -TFTs are normal and I will attempt to decrease methimazole  again   Medications   Decrease methimazole  5 mg , 1 tablet Monday through Saturday and none on Sundays   4). Graves' Disease:   -No extrathyroidal manifestation of Graves' disease    F/U in 3 months     Signed electronically by: Natale Bail, MD  [facility]  [facility] [address] [place] [place], [place] [address] Phone: [phone] FAX: [phone]      CC: Aleta Anda, MD [address] [place] [address] Phone: [phone]  Fax: [phone]   Return to Endocrinology clinic as below: Future Appointments  Date Time Provider Department Center  07/13/2023 10:30 AM Tung Pustejovsky, Julian Obey, MD [facility] None  07/24/2023  2:30 PM Evertt Hoe, DPM [facility]  07/30/2023 10:15 AM Jennefer Moats, DPM [facility]  08/07/2023  1:45 PM Standiford, Karlene Overcast, DPM [facility]  03/17/2024 10:00 AM [facility]  03/17/2024 11:00 AM [facility]

## 2023-07-24 ENCOUNTER — Ambulatory Visit (INDEPENDENT_AMBULATORY_CARE_PROVIDER_SITE_OTHER): Admitting: Podiatry

## 2023-07-24 DIAGNOSIS — Z91199 Patient's noncompliance with other medical treatment and regimen due to unspecified reason: Secondary | ICD-10-CM

## 2023-07-24 NOTE — Progress Notes (Signed)
 NS

## 2023-07-30 ENCOUNTER — Encounter: Payer: Self-pay | Admitting: Podiatry

## 2023-07-30 ENCOUNTER — Ambulatory Visit (INDEPENDENT_AMBULATORY_CARE_PROVIDER_SITE_OTHER): Admitting: Podiatry

## 2023-07-30 DIAGNOSIS — E1142 Type 2 diabetes mellitus with diabetic polyneuropathy: Secondary | ICD-10-CM

## 2023-07-30 DIAGNOSIS — B351 Tinea unguium: Secondary | ICD-10-CM

## 2023-07-30 DIAGNOSIS — L97511 Non-pressure chronic ulcer of other part of right foot limited to breakdown of skin: Secondary | ICD-10-CM

## 2023-07-30 DIAGNOSIS — M79674 Pain in right toe(s): Secondary | ICD-10-CM

## 2023-07-30 DIAGNOSIS — M79675 Pain in left toe(s): Secondary | ICD-10-CM

## 2023-07-30 DIAGNOSIS — E11621 Type 2 diabetes mellitus with foot ulcer: Secondary | ICD-10-CM

## 2023-07-30 NOTE — Progress Notes (Signed)
 Subjective:  Patient ID: Carmen Armstrong, female    DOB: 1965/10/26,   MRN: [id number]  Chief Complaint  Patient presents with   [facility]    [facility]    58 y.o. female presents for concern of thickened elongated and painful nails that are difficult to trim. Requesting to have them trimmed today. Relates burning and tingling in their feet. Has had multiple toe amputations since last seen by me. Amputation of right great toe and left second toe.  Relates a lot of pain currently in the right second toe and has notices some draiange.   Patient is diabetic and last A1c was  Lab Results  Component Value Date   HGBA1C 13.1 (A) 04/13/2023   .    PCP:  Aleta Anda, MD    . Denies any other pedal complaints. Denies n/v/f/c.   Past Medical History:  Diagnosis Date   Arrhythmia    Arthritis    Atrial fibrillation (HCC)    CAD (coronary artery disease)    Chronic lower back pain    Depression    Diabetes mellitus without complication (HCC)    Eczema    GAD (generalized anxiety disorder)    GERD (gastroesophageal reflux disease)    H/O vitamin D deficiency    Heart failure (HCC)    Hyperlipidemia    Hypertension    Hypothyroidism    Insomnia    Peripheral neuropathy    PVD (peripheral vascular disease) (HCC)     Objective:  Physical Exam: Vascular: DP/PT pulses 1/4 bilateral. CFT <3 seconds. Absent hair growth on digits. Edema noted to bilateral lower extremities. Xerosis noted bilaterally.  Skin. No lacerations or abrasions bilateral feet. Nails 1-5 bilateral remaining are thickened discolored and elongated with subungual debris. Superficial ulceration noted to distal right second digit upon debridement. No erythema edema or purulence noted.  Musculoskeletal: MMT 5/5 bilateral lower extremities in DF, PF, Inversion and Eversion. Deceased ROM in DF of ankle joint. 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 Hammered digits 1-5 bilateral.  Neurological: Sensation intact  to light touch. Protective sensation diminished bilateral.    Assessment:   1. Pain due to onychomycosis of toenails of both feet   2. Diabetic polyneuropathy associated with type 2 diabetes mellitus (HCC)             Plan:  Patient was evaluated and treated and all questions answered. Ulcer right distal hallux-healed.  -Discussed and educated patient on diabetic foot care, especially with  regards to the vascular, neurological and musculoskeletal systems.  -Stressed the importance of good glycemic control and the detriment of not  controlling glucose levels in relation to the foot. -Discussed supportive shoes at all times and checking feet regularly.  -Mechanically debrided all nails 1-5 bilateral using sterile nail nipper and filed with dremel without incident  Ulcer right second digit limited to breakdown of skin  -Debridement as below. -Dressed with betadine, DSD. -Off-loading with surgical shoe. -No abx indicated.  -Discussed glucose control and proper protein-rich diet.  -Discussed if any worsening redness, pain, fever or chills to call or may need to report to the emergency room. Patient expressed understanding.   Procedure: Excisional Debridement of Wound Rationale: Removal of non-viable soft tissue from the wound to promote healing.  Anesthesia: none Pre-Debridement Wound Measurements: Overlying  callus  Post-Debridement Wound Measurements: 0.1 cm x 0.2 cm x 0.1 cm  Type of Debridement: Sharp Excisional Tissue Removed: Non-viable soft tissue Depth of Debridement: subcutaneous tissue. Technique:  Sharp excisional debridement to bleeding, viable wound base.  Dressing: Dry, sterile, compression dressing. Disposition: Patient tolerated procedure well. Patient to return in 2 week for follow-up.  Return in about 2 weeks (around 08/13/2023) for wound check.   No follow-ups on file.   Jennefer Moats, DPM

## 2023-07-31 ENCOUNTER — Encounter: Payer: Self-pay | Admitting: Internal Medicine

## 2023-07-31 ENCOUNTER — Ambulatory Visit: Admitting: Internal Medicine

## 2023-07-31 VITALS — BP 138/80 | HR 85 | Ht 69.0 in | Wt 204.0 lb

## 2023-07-31 DIAGNOSIS — E1165 Type 2 diabetes mellitus with hyperglycemia: Secondary | ICD-10-CM | POA: Diagnosis not present

## 2023-07-31 DIAGNOSIS — Z794 Long term (current) use of insulin: Secondary | ICD-10-CM | POA: Diagnosis not present

## 2023-07-31 DIAGNOSIS — E0590 Thyrotoxicosis, unspecified without thyrotoxic crisis or storm: Secondary | ICD-10-CM

## 2023-07-31 DIAGNOSIS — E1142 Type 2 diabetes mellitus with diabetic polyneuropathy: Secondary | ICD-10-CM

## 2023-07-31 DIAGNOSIS — E0500 Thyrotoxicosis with diffuse goiter without thyrotoxic crisis or storm: Secondary | ICD-10-CM

## 2023-07-31 LAB — POCT GLYCOSYLATED HEMOGLOBIN (HGB A1C): Hemoglobin A1C: 6.7 % — AB (ref 4.0–5.6)

## 2023-07-31 LAB — POCT GLUCOSE (DEVICE FOR HOME USE): POC Glucose: 170 mg/dL — AB (ref 70–99)

## 2023-07-31 MED ORDER — TIRZEPATIDE 12.5 MG/0.5ML ~~LOC~~ SOAJ
12.5000 mg | SUBCUTANEOUS | 3 refills | Status: AC
Start: 2023-07-31 — End: ?

## 2023-07-31 NOTE — Patient Instructions (Addendum)
 Take  Xigduo  06-998 XR, TWO tablets every Morning  Increase Mounjaro  12.5 mg weekly Continue Tresiba  54 units daily Take Novolog  8 units with each meal , PLUS the scale   Humalog  correctional insulin : Use the scale below to help guide you before each meal and bedtime   Blood sugar before meal Number of units to inject  Less than 160 0 unit  161 -  190 1 units  191 -  220 2 units  221 -  250 3 units  251 -  280 4 units  281 -  310 5 units  311 -  340 6 units  341 -  370 7 units  371 -  400 8 units    HOW TO TREAT LOW BLOOD SUGARS (Blood sugar LESS THAN 70 MG/DL) Please follow the RULE OF 15 for the treatment of hypoglycemia treatment (when your (blood sugars are less than 70 mg/dL)   STEP 1: Take 15 grams of carbohydrates when your blood sugar is low, which includes:  3-4 GLUCOSE TABS  OR 3-4 OZ OF JUICE OR REGULAR SODA OR ONE TUBE OF GLUCOSE GEL    STEP 2: RECHECK blood sugar in 15 MINUTES STEP 3: If your blood sugar is still low at the 15 minute recheck --> then, go back to STEP 1 and treat AGAIN with another 15 grams of carbohydrates.

## 2023-07-31 NOTE — Progress Notes (Signed)
 Name: Carmen Armstrong  MRN/ DOB: [id number], Aug 03, 1965    Age/ Sex: 58 y.o., female     PCP: Aleta Anda, MD   Reason for Endocrinology Evaluation: Hyperthyroidism     Initial Endocrinology Clinic Visit: 11/05/2019    PATIENT IDENTIFIER: Carmen Armstrong is a 58 y.o., female with a past medical history of HTN, T2DM, PAF , CHF and dyslipidemia. She has followed with [facility] since 11/05/2019 for consultative assistance with management of her Hyperthyroidism.      HISTORICAL SUMMARY:   She was diagnosed with hyperthyroidism in 10/2018 during routine workup . Two months prior to her presentation she was noted with dry eyes. Had occasional double vision, and is under the care of an ophthalmologist.     Pt on Multaq  Since 2020. S/P cardiac ablation  . Has not been on amiodarone since 2021  Thyroid  ultrasound did not reveal any thyroid  nodules 12/2020 TRAb elevated 30.55 (2021)  Methimazole  started 10/2019   Paternal grandmother with thyroid  disease     DM HISTORY: She has been diagnosed with DM > 20 yrs ago.  She is intolerant to Trulicity.  Her A1c has ranged from 8.2% in 2022, peaking at 9.0% in 08/2022  I have assumed her diabetes management 08/2022 per her request  On her initial visit with me we switched Ozempic to Mounjaro .  Continued basal insulin , and adjusted Xigduo   She was prescribed Humalog  to pain management clinic 2024  SUBJECTIVE:   During the last visit (04/13/2023): A1c 13.1%     Today (07/31/23): [name] is here for follow-up with diabetes management and hyperthyroidism. She checks her blood sugars multiple times daily through Dexcom. The patient has had hypoglycemic episodes since the last clinic visit.   Since her last visit here she presented to the ED with shortness of breath, was diagnosed with influenza A 04/2023  She presented to the ED 05/15/2023 with cellulitis of the lower extremity, she is s/p  amputation of the right great toe and the left second toe , she continues to follow-up with podiatry   She is s/p spinal cord stimulator placement 04/2022  Patient continues to follow-up with cardiology for CAD, peripheral artery disease  Denies nausea or vomiting  Has occasional constipation- on linzess  every other day  Denies local neck symptoms  Denies palpitations    HOME DIABETES REGIMEN:  Methimazole  5 mg , 1 tablet Monday through Saturday and none on Sundays Xigduo  06-998 XR, 2 tabs daily Mounjaro  10 mg weekly  Tresiba   54 units daily  Novolog  10 units TIDQAC Correction factor : Humalog  (BG-130/30) TIDQAC and QHS   Statin: yes ACE-I/ARB: yes   CONTINUOUS GLUCOSE MONITORING RECORD INTERPRETATION    Dates of Recording: 5/4 - 06/30/2023  Sensor description:dexcom  Results statistics:   CGM use % of time 76  Average and SD 166/54  Time in range 59 %  % Time Above 180 33  % Time above 250 7  % Time Below target 1   Glycemic patterns summary: BG's trend down overnight and fluctuate during the day  Hypoglycemic episodes occurred during the day  Overnight periods: Trends down to optimal    DIABETIC COMPLICATIONS: Microvascular complications:  Neuropathy , right great and left second toe amputation 05/2023 Denies:  Last Eye Exam: Completed   Macrovascular complications:  CAD, PVD Denies:  CVA       HISTORY:  Past Medical History:  Past Medical History:  Diagnosis Date   Arrhythmia  Arthritis    Atrial fibrillation (HCC)    CAD (coronary artery disease)    Chronic lower back pain    Depression    Diabetes mellitus without complication (HCC)    Eczema    GAD (generalized anxiety disorder)    GERD (gastroesophageal reflux disease)    H/O vitamin D deficiency    Heart failure (HCC)    Hyperlipidemia    Hypertension    Hypothyroidism    Insomnia    Peripheral neuropathy    PVD (peripheral vascular disease) (HCC)    Past Surgical History:   Past Surgical History:  Procedure Laterality Date   ABDOMINAL AORTOGRAM W/LOWER EXTREMITY Bilateral 05/21/2023   Procedure: ABDOMINAL AORTOGRAM W/LOWER EXTREMITY;  Surgeon: Philipp Brawn, MD;  Location: [facility];  Service: Cardiovascular;  Laterality: Bilateral;   AMPUTATION Right 05/22/2023   Procedure: AMPUTATION, FOOT, RAY;  Surgeon: Evertt Hoe, DPM;  Location: [facility] OR;  Service: Orthopedics/Podiatry;  Laterality: Right;  Left second toe partial amp   AMPUTATION TOE Left 05/22/2023   Procedure: AMPUTATION, TOE;  Surgeon: Evertt Hoe, DPM;  Location: [facility] OR;  Service: Orthopedics/Podiatry;  Laterality: Left;  Right great toe partial amputation   ANGIOPLASTY     legs   BACK SURGERY     5 different times   BREAST BIOPSY Right 07/24/2022   MM RT BREAST BX W LOC DEV 1ST LESION IMAGE BX SPEC STEREO GUIDE 07/24/2022 [facility]   CARDIAC CATHETERIZATION  2020   REPLACEMENT TOTAL HIP W/  RESURFACING IMPLANTS Right 2018   RIGHT AND LEFT HEART CATH     ROTATOR CUFF REPAIR Right 2016   SPINAL CORD STIMULATOR INSERTION N/A 05/08/2022   Procedure: PLACEMENT OF SPINAL CORD STIMULATOR;  Surgeon: Mort Ards, MD;  Location: [facility];  Service: Orthopedics;  Laterality: N/A;   SPINE SURGERY  2014,2016,2017,2019   toe amputated  09/2018   5th left toe   TOE AMPUTATION Left    great toe on left side   Social History:  reports that she has been smoking cigarettes. She has a 15 pack-year smoking history. She has never used smokeless tobacco. She reports that she does not currently use alcohol. She reports that she does not currently use drugs. Family History:  Family History  Problem Relation Age of Onset   Hypertension Mother    Hyperlipidemia Mother    Diabetes Mother    Stroke Mother    Clotting disorder Father    Hypertension Father    Hyperlipidemia Father    Hyperlipidemia Sister    Diabetes Brother    Neuropathy Brother      HOME  MEDICATIONS: Allergies as of 07/31/2023       Reactions   Other Shortness Of Breath   Hazelnuts   Corylus Other (See Comments)   Unknown    Omeprazole Other (See Comments)   Unknown    Pear Other (See Comments)   Unknown    Penicillin G Other (See Comments)   Whelps - childhood allergy   Trulicity [dulaglutide] Other (See Comments)   Large itchy bumps        Medication List        Accurate as of July 31, 2023 10:04 AM. If you have any questions, ask your nurse or doctor.          Airsupra 90-80 MCG/ACT Aero Generic drug: Albuterol -Budesonide  Inhale 2 puffs into the lungs in the morning and at bedtime.   amLODipine  10 MG tablet  Commonly known as: NORVASC  Take 1 tablet (10 mg total) by mouth daily.   amoxicillin -clavulanate 875-125 MG tablet Commonly known as: AUGMENTIN  Take 1 tablet by mouth 2 (two) times daily.   atorvastatin  40 MG tablet Commonly known as: LIPITOR Take 40 mg by mouth at bedtime.   Belbuca 150 MCG Film Generic drug: Buprenorphine HCl Take 1 Film by mouth every 12 (twelve) hours.   Belbuca 300 MCG Film Generic drug: Buprenorphine HCl Take by mouth 2 (two) times daily.   buPROPion  300 MG 24 hr tablet Commonly known as: WELLBUTRIN  XL Take 300 mg by mouth every morning.   candesartan 4 MG tablet Commonly known as: ATACAND Take 4 mg by mouth daily.   colchicine 0.6 MG tablet Take 0.6-1.2 mg by mouth See admin instructions. Take 1.2 mg for the first initial dose then 0.6 mg daily.   Eliquis  5 MG Tabs tablet Generic drug: apixaban  Take 1 tablet (5 mg total) by mouth 2 (two) times daily.   FLUoxetine  20 MG capsule Commonly known as: PROZAC  Take 20 mg by mouth daily.   fluticasone-salmeterol 250-50 MCG/ACT Aepb Commonly known as: ADVAIR Inhale 1 puff into the lungs 2 (two) times daily.   Linzess  145 MCG Caps capsule Generic drug: linaclotide  Take 145 mcg by mouth daily.   methimazole  5 MG tablet Commonly known as: TAPAZOLE  Take  1 tablet (5 mg total) by mouth as directed. 1 tablet Monday through Saturday, none on Sundays   metoprolol  succinate 100 MG 24 hr tablet Commonly known as: TOPROL -XL Take 100 mg by mouth daily.   naloxone  4 MG/0.1ML Liqd nasal spray kit Commonly known as: NARCAN  Place 1 spray into the nose as needed (OD).   NovoLOG  FlexPen 100 UNIT/ML FlexPen Generic drug: insulin  aspart Inject 10-20 Units into the skin 3 (three) times daily with meals.   omeprazole 20 MG capsule Commonly known as: PRILOSEC Take 20 mg by mouth daily.   pregabalin  100 MG capsule Commonly known as: Lyrica  Take 1 capsule (100 mg total) by mouth 3 (three) times daily.   spironolactone  25 MG tablet Commonly known as: ALDACTONE  TAKE 1 TABLET BY MOUTH DAILY   tirzepatide  10 MG/0.5ML Pen Commonly known as: MOUNJARO  Inject 10 mg into the skin once a week.   traMADol 50 MG tablet Commonly known as: ULTRAM Take 50 mg by mouth every 8 (eight) hours as needed.   Tresiba  FlexTouch 100 UNIT/ML FlexTouch Pen Generic drug: insulin  degludec Inject 64 Units into the skin daily. What changed: how much to take   Xigduo  XR 11-998 MG Tb24 Generic drug: Dapagliflozin  Pro-metFORMIN  ER Take 2 tablets by mouth daily.   zolpidem  6.25 MG CR tablet Commonly known as: AMBIEN  CR Take 12.5 mg by mouth at bedtime.          OBJECTIVE:   PHYSICAL EXAM: VS: BP 138/80 (BP Location: Left Arm, Patient Position: Sitting, Cuff Size: Normal)   Pulse 85   Ht 5' 9 (1.753 m)   Wt 204 lb (92.5 kg)   SpO2 98%   BMI 30.13 kg/m     EXAM: General: NAD  Neck: General: Supple without adenopathy. Thyroid : no nodules appreciated   Lungs: Clear with good BS bilat  Heart: Auscultation: RRR.  Mental Status: Judgment, insight: Intact Mood and affect: No depression, anxiety, or agitation   DM Foot Exam 07/10/2023  per podiatry      DATA REVIEWED:   Latest Reference Range & Units 06/13/23 16:14  TSH 0.41 - 5.90  1.33 (E)   (  E): External lab result  Results for AYLANI, SPURLOCK (MRN [id number]) as of 01/26/2020 13:34  Ref. Range 12/17/2019 10:24  TRAB Latest Ref Range: <=2.00 IU/L 30.55 (H)    Thyroid  Ultrasound 01/12/2021  Estimated total number of nodules >/= 1 cm: 0   Number of spongiform nodules >/=  2 cm not described below (TR1): 0   Number of mixed cystic and solid nodules >/= 1.5 cm not described below (TR2): 0   _________________________________________________________   No discrete nodules are seen within the thyroid  gland.   IMPRESSION: 1. Borderline enlarged thyroid  gland without discrete nodule identified.    ASSESSMENT / PLAN / RECOMMENDATIONS:   1) Type 2 Diabetes Mellitus, optimally controlled, With neuropathic and macrovascular complications - Most recent A1c of  6.7%. Goal A1c < 7.0 %.    -A1c has trended down from 13.1% to 6.7% -PCP decrease Tresiba  due to hypoglycemia, no change in basal insulin  at this time -Will increase Mounjaro  as below - I will decrease NovoLog  as below    MEDICATIONS: Continue  Xigduo  06-998 XR, 2 tabs daily Increase Mounjaro  12.5 mg weekly Continue Tresiba  54 units daily Decrease NovoLog  8 units TIDQAC Continue correction factor : NovoLog  (BG-130/30) TIDQAC and QHS    EDUCATION / INSTRUCTIONS: BG monitoring instructions: Patient is instructed to check her  blood sugars 3 times a day. Call [facility] if: BG persistently < 70  I reviewed the Rule of 15 for the treatment of hypoglycemia in detail with the patient. Literature supplied.    2) Diabetic complications:  Eye: Does not have known diabetic retinopathy.  Neuro/ Feet: Does  have known diabetic peripheral neuropathy .  Renal: Patient does not have known baseline CKD. She   is  on an ACEI/ARB at present.    3)Hyperthyroidism Secondary to Graves' Disease:    - Pt is clinically euthyroid  - No local neck symptoms - TFTs normal 06/13/2023, no  change   Medications   Continue methimazole  5 mg , 1 tablet Monday through Saturday and none on Sundays   4). Graves' Disease:   -No extrathyroidal manifestation of Graves' disease    F/U in 5 months     Signed electronically by: Natale Bail, MD  [facility] Endocrinology  [facility] [address] [place] [place], [place] [address] Phone: [phone] FAX: [phone]      CC: Aleta Anda, MD [address] [place] [address] Phone: [phone]  Fax: [phone]   Return to Endocrinology clinic as below: Future Appointments  Date Time Provider Department Center  07/31/2023 10:10 AM Oswin Johal, Julian Obey, MD [facility] None  08/07/2023  1:45 PM Standiford, Karlene Overcast, DPM [facility]  08/14/2023 10:45 AM Jennefer Moats, DPM [facility]  03/17/2024 10:00 AM [facility]  03/17/2024 11:00 AM [facility]

## 2023-08-07 ENCOUNTER — Encounter (HOSPITAL_COMMUNITY): Payer: Self-pay

## 2023-08-07 ENCOUNTER — Inpatient Hospital Stay (HOSPITAL_COMMUNITY)
Admission: EM | Admit: 2023-08-07 | Discharge: 2023-08-09 | DRG: 617 | Disposition: A | Discharge status: Home / Self Care | Source: Ambulatory Visit | Attending: Internal Medicine | Admitting: Internal Medicine

## 2023-08-07 ENCOUNTER — Ambulatory Visit (INDEPENDENT_AMBULATORY_CARE_PROVIDER_SITE_OTHER)

## 2023-08-07 ENCOUNTER — Ambulatory Visit (INDEPENDENT_AMBULATORY_CARE_PROVIDER_SITE_OTHER): Admitting: Podiatry

## 2023-08-07 ENCOUNTER — Other Ambulatory Visit: Payer: Self-pay

## 2023-08-07 ENCOUNTER — Emergency Department (HOSPITAL_COMMUNITY)

## 2023-08-07 DIAGNOSIS — I2510 Atherosclerotic heart disease of native coronary artery without angina pectoris: Secondary | ICD-10-CM | POA: Diagnosis present

## 2023-08-07 DIAGNOSIS — G894 Chronic pain syndrome: Secondary | ICD-10-CM | POA: Diagnosis present

## 2023-08-07 DIAGNOSIS — G629 Polyneuropathy, unspecified: Secondary | ICD-10-CM

## 2023-08-07 DIAGNOSIS — E11649 Type 2 diabetes mellitus with hypoglycemia without coma: Secondary | ICD-10-CM | POA: Diagnosis present

## 2023-08-07 DIAGNOSIS — E872 Acidosis, unspecified: Secondary | ICD-10-CM | POA: Diagnosis present

## 2023-08-07 DIAGNOSIS — Z9682 Presence of neurostimulator: Secondary | ICD-10-CM

## 2023-08-07 DIAGNOSIS — D649 Anemia, unspecified: Secondary | ICD-10-CM | POA: Diagnosis present

## 2023-08-07 DIAGNOSIS — I480 Paroxysmal atrial fibrillation: Secondary | ICD-10-CM | POA: Diagnosis present

## 2023-08-07 DIAGNOSIS — I110 Hypertensive heart disease with heart failure: Secondary | ICD-10-CM | POA: Diagnosis present

## 2023-08-07 DIAGNOSIS — L089 Local infection of the skin and subcutaneous tissue, unspecified: Secondary | ICD-10-CM | POA: Diagnosis not present

## 2023-08-07 DIAGNOSIS — I5032 Chronic diastolic (congestive) heart failure: Secondary | ICD-10-CM | POA: Diagnosis present

## 2023-08-07 DIAGNOSIS — E1152 Type 2 diabetes mellitus with diabetic peripheral angiopathy with gangrene: Secondary | ICD-10-CM | POA: Diagnosis present

## 2023-08-07 DIAGNOSIS — M869 Osteomyelitis, unspecified: Secondary | ICD-10-CM | POA: Diagnosis not present

## 2023-08-07 DIAGNOSIS — E0590 Thyrotoxicosis, unspecified without thyrotoxic crisis or storm: Secondary | ICD-10-CM | POA: Diagnosis present

## 2023-08-07 DIAGNOSIS — S98111A Complete traumatic amputation of right great toe, initial encounter: Secondary | ICD-10-CM

## 2023-08-07 DIAGNOSIS — Z83438 Family history of other disorder of lipoprotein metabolism and other lipidemia: Secondary | ICD-10-CM

## 2023-08-07 DIAGNOSIS — E785 Hyperlipidemia, unspecified: Secondary | ICD-10-CM | POA: Diagnosis present

## 2023-08-07 DIAGNOSIS — E11621 Type 2 diabetes mellitus with foot ulcer: Secondary | ICD-10-CM | POA: Diagnosis present

## 2023-08-07 DIAGNOSIS — Z91018 Allergy to other foods: Secondary | ICD-10-CM

## 2023-08-07 DIAGNOSIS — Z8679 Personal history of other diseases of the circulatory system: Secondary | ICD-10-CM | POA: Diagnosis not present

## 2023-08-07 DIAGNOSIS — J4520 Mild intermittent asthma, uncomplicated: Secondary | ICD-10-CM

## 2023-08-07 DIAGNOSIS — E039 Hypothyroidism, unspecified: Secondary | ICD-10-CM | POA: Diagnosis present

## 2023-08-07 DIAGNOSIS — E1142 Type 2 diabetes mellitus with diabetic polyneuropathy: Secondary | ICD-10-CM | POA: Diagnosis present

## 2023-08-07 DIAGNOSIS — Z823 Family history of stroke: Secondary | ICD-10-CM

## 2023-08-07 DIAGNOSIS — F411 Generalized anxiety disorder: Secondary | ICD-10-CM | POA: Diagnosis present

## 2023-08-07 DIAGNOSIS — L97519 Non-pressure chronic ulcer of other part of right foot with unspecified severity: Secondary | ICD-10-CM | POA: Diagnosis present

## 2023-08-07 DIAGNOSIS — Z8249 Family history of ischemic heart disease and other diseases of the circulatory system: Secondary | ICD-10-CM

## 2023-08-07 DIAGNOSIS — M7752 Other enthesopathy of left foot: Secondary | ICD-10-CM

## 2023-08-07 DIAGNOSIS — Z899 Acquired absence of limb, unspecified: Secondary | ICD-10-CM

## 2023-08-07 DIAGNOSIS — E1169 Type 2 diabetes mellitus with other specified complication: Secondary | ICD-10-CM | POA: Diagnosis present

## 2023-08-07 DIAGNOSIS — Z79899 Other long term (current) drug therapy: Secondary | ICD-10-CM

## 2023-08-07 DIAGNOSIS — Z880 Allergy status to penicillin: Secondary | ICD-10-CM

## 2023-08-07 DIAGNOSIS — L97511 Non-pressure chronic ulcer of other part of right foot limited to breakdown of skin: Secondary | ICD-10-CM

## 2023-08-07 DIAGNOSIS — Z7951 Long term (current) use of inhaled steroids: Secondary | ICD-10-CM

## 2023-08-07 DIAGNOSIS — Z89431 Acquired absence of right foot: Secondary | ICD-10-CM | POA: Diagnosis not present

## 2023-08-07 DIAGNOSIS — M86171 Other acute osteomyelitis, right ankle and foot: Secondary | ICD-10-CM | POA: Diagnosis present

## 2023-08-07 DIAGNOSIS — Z7901 Long term (current) use of anticoagulants: Secondary | ICD-10-CM

## 2023-08-07 DIAGNOSIS — I739 Peripheral vascular disease, unspecified: Secondary | ICD-10-CM | POA: Diagnosis not present

## 2023-08-07 DIAGNOSIS — E7849 Other hyperlipidemia: Secondary | ICD-10-CM

## 2023-08-07 DIAGNOSIS — J45909 Unspecified asthma, uncomplicated: Secondary | ICD-10-CM | POA: Diagnosis present

## 2023-08-07 DIAGNOSIS — E119 Type 2 diabetes mellitus without complications: Secondary | ICD-10-CM | POA: Diagnosis not present

## 2023-08-07 DIAGNOSIS — Z888 Allergy status to other drugs, medicaments and biological substances status: Secondary | ICD-10-CM

## 2023-08-07 DIAGNOSIS — Z87891 Personal history of nicotine dependence: Secondary | ICD-10-CM

## 2023-08-07 DIAGNOSIS — E11628 Type 2 diabetes mellitus with other skin complications: Secondary | ICD-10-CM | POA: Diagnosis present

## 2023-08-07 DIAGNOSIS — F32A Depression, unspecified: Secondary | ICD-10-CM | POA: Diagnosis present

## 2023-08-07 DIAGNOSIS — F1123 Opioid dependence with withdrawal: Secondary | ICD-10-CM | POA: Diagnosis present

## 2023-08-07 DIAGNOSIS — E1151 Type 2 diabetes mellitus with diabetic peripheral angiopathy without gangrene: Secondary | ICD-10-CM | POA: Diagnosis present

## 2023-08-07 DIAGNOSIS — Z794 Long term (current) use of insulin: Secondary | ICD-10-CM

## 2023-08-07 DIAGNOSIS — Z832 Family history of diseases of the blood and blood-forming organs and certain disorders involving the immune mechanism: Secondary | ICD-10-CM

## 2023-08-07 DIAGNOSIS — F17210 Nicotine dependence, cigarettes, uncomplicated: Secondary | ICD-10-CM | POA: Diagnosis not present

## 2023-08-07 DIAGNOSIS — Z7985 Long-term (current) use of injectable non-insulin antidiabetic drugs: Secondary | ICD-10-CM

## 2023-08-07 DIAGNOSIS — Z833 Family history of diabetes mellitus: Secondary | ICD-10-CM

## 2023-08-07 LAB — CBC WITH DIFFERENTIAL/PLATELET
Abs Immature Granulocytes: 0.01 10*3/uL (ref 0.00–0.07)
Basophils Absolute: 0 10*3/uL (ref 0.0–0.1)
Basophils Relative: 1 %
Eosinophils Absolute: 0.1 10*3/uL (ref 0.0–0.5)
Eosinophils Relative: 2 %
HCT: 35.5 % — ABNORMAL LOW (ref 36.0–46.0)
Hemoglobin: 10.8 g/dL — ABNORMAL LOW (ref 12.0–15.0)
Immature Granulocytes: 0 %
Lymphocytes Relative: 45 %
Lymphs Abs: 3.3 10*3/uL (ref 0.7–4.0)
MCH: 25.1 pg — ABNORMAL LOW (ref 26.0–34.0)
MCHC: 30.4 g/dL (ref 30.0–36.0)
MCV: 82.4 fL (ref 80.0–100.0)
Monocytes Absolute: 0.5 10*3/uL (ref 0.1–1.0)
Monocytes Relative: 7 %
Neutro Abs: 3.2 10*3/uL (ref 1.7–7.7)
Neutrophils Relative %: 45 %
Platelets: 180 10*3/uL (ref 150–400)
RBC: 4.31 MIL/uL (ref 3.87–5.11)
RDW: 17.2 % — ABNORMAL HIGH (ref 11.5–15.5)
WBC: 7.2 10*3/uL (ref 4.0–10.5)
nRBC: 0 % (ref 0.0–0.2)

## 2023-08-07 LAB — COMPREHENSIVE METABOLIC PANEL WITH GFR
ALT: 10 U/L (ref 0–44)
AST: 15 U/L (ref 15–41)
Albumin: 3.3 g/dL — ABNORMAL LOW (ref 3.5–5.0)
Alkaline Phosphatase: 69 U/L (ref 38–126)
Anion gap: 12 (ref 5–15)
BUN: 8 mg/dL (ref 6–20)
CO2: 22 mmol/L (ref 22–32)
Calcium: 9.1 mg/dL (ref 8.9–10.3)
Chloride: 106 mmol/L (ref 98–111)
Creatinine, Ser: 0.91 mg/dL (ref 0.44–1.00)
GFR, Estimated: >60 mL/min (ref >60)
Glucose, Bld: 107 mg/dL — ABNORMAL HIGH (ref 70–99)
Potassium: 4.1 mmol/L (ref 3.5–5.1)
Sodium: 140 mmol/L (ref 135–145)
Total Bilirubin: 0.4 mg/dL (ref 0.0–1.2)
Total Protein: 7 g/dL (ref 6.5–8.1)

## 2023-08-07 LAB — I-STAT CG4 LACTIC ACID, ED
Lactic Acid, Venous: 1.5 mmol/L (ref 0.5–1.9)
Lactic Acid, Venous: 2.2 mmol/L (ref 0.5–1.9)

## 2023-08-07 LAB — CBG MONITORING, ED: Glucose-Capillary: 69 mg/dL — ABNORMAL LOW (ref 70–99)

## 2023-08-07 MED ORDER — TRAMADOL HCL 50 MG PO TABS
50.0000 mg | ORAL_TABLET | Freq: Three times a day (TID) | ORAL | Status: DC | PRN
  Administered 2023-08-09: 50 mg via ORAL
  Filled 2023-08-07: qty 1

## 2023-08-07 MED ORDER — METOPROLOL SUCCINATE ER 50 MG PO TB24
100.0000 mg | ORAL_TABLET | Freq: Every day | ORAL | Status: DC
  Administered 2023-08-08: 100 mg via ORAL
  Filled 2023-08-07: qty 1

## 2023-08-07 MED ORDER — METRONIDAZOLE 500 MG/100ML IV SOLN
500.0000 mg | Freq: Two times a day (BID) | INTRAVENOUS | Status: DC
  Administered 2023-08-08 – 2023-08-09 (×3): 500 mg via INTRAVENOUS
  Filled 2023-08-07 (×3): qty 100

## 2023-08-07 MED ORDER — SODIUM CHLORIDE 0.9% FLUSH: 3.0000 mL | INTRAVENOUS | Status: DC | PRN

## 2023-08-07 MED ORDER — SODIUM CHLORIDE 0.9 % IV SOLN
2.0000 g | Freq: Three times a day (TID) | INTRAVENOUS | Status: DC
  Administered 2023-08-07 – 2023-08-08 (×2): 2 g via INTRAVENOUS
  Filled 2023-08-07 (×2): qty 12.5

## 2023-08-07 MED ORDER — LINACLOTIDE 145 MCG PO CAPS
145.0000 ug | ORAL_CAPSULE | Freq: Every day | ORAL | Status: DC
  Administered 2023-08-08: 145 ug via ORAL
  Filled 2023-08-07: qty 1

## 2023-08-07 MED ORDER — FLUTICASONE FUROATE-VILANTEROL 200-25 MCG/ACT IN AEPB
1.0000 | INHALATION_SPRAY | Freq: Every day | RESPIRATORY_TRACT | Status: DC
  Administered 2023-08-08 – 2023-08-09 (×2): 1 via RESPIRATORY_TRACT
  Filled 2023-08-07: qty 28

## 2023-08-07 MED ORDER — SODIUM CHLORIDE 0.9 % IV SOLN
250.0000 mL | INTRAVENOUS | Status: AC | PRN
Start: 2023-08-07 — End: 2023-08-08

## 2023-08-07 MED ORDER — SODIUM CHLORIDE 0.9 % IV SOLN: 2.0000 g | Freq: Once | INTRAVENOUS | Status: DC

## 2023-08-07 MED ORDER — LACTATED RINGERS IV SOLN: INTRAVENOUS | Status: DC

## 2023-08-07 MED ORDER — SODIUM CHLORIDE 0.9 % IV BOLUS
500.0000 mL | Freq: Once | INTRAVENOUS | Status: AC
  Administered 2023-08-07: 500 mL via INTRAVENOUS

## 2023-08-07 MED ORDER — LORAZEPAM 1 MG PO TABS
1.0000 mg | ORAL_TABLET | ORAL | Status: AC | PRN
  Administered 2023-08-07: 1 mg via ORAL
  Filled 2023-08-07: qty 1

## 2023-08-07 MED ORDER — ONDANSETRON HCL 4 MG/2ML IJ SOLN: 4.0000 mg | Freq: Four times a day (QID) | INTRAMUSCULAR | Status: DC | PRN

## 2023-08-07 MED ORDER — ACETAMINOPHEN 650 MG RE SUPP: 650.0000 mg | Freq: Four times a day (QID) | RECTAL | Status: DC | PRN

## 2023-08-07 MED ORDER — METHIMAZOLE 5 MG PO TABS
5.0000 mg | ORAL_TABLET | ORAL | Status: DC
  Administered 2023-08-08: 5 mg via ORAL
  Filled 2023-08-07 (×2): qty 1

## 2023-08-07 MED ORDER — SODIUM CHLORIDE 0.9% FLUSH
3.0000 mL | Freq: Two times a day (BID) | INTRAVENOUS | Status: DC
  Administered 2023-08-08 (×2): 3 mL via INTRAVENOUS

## 2023-08-07 MED ORDER — INSULIN ASPART 100 UNIT/ML IJ SOLN: 0.0000 [IU] | Freq: Every day | INTRAMUSCULAR | Status: DC

## 2023-08-07 MED ORDER — NALOXONE HCL 0.4 MG/ML IJ SOLN: 0.4000 mg | INTRAMUSCULAR | Status: DC | PRN

## 2023-08-07 MED ORDER — BUPRENORPHINE HCL 150 MCG BU FILM: 1.0000 | ORAL_FILM | Freq: Two times a day (BID) | BUCCAL | Status: DC

## 2023-08-07 MED ORDER — METRONIDAZOLE 500 MG PO TABS
500.0000 mg | ORAL_TABLET | Freq: Once | ORAL | Status: AC
  Administered 2023-08-07: 500 mg via ORAL
  Filled 2023-08-07: qty 1

## 2023-08-07 MED ORDER — FLUOXETINE HCL 20 MG PO CAPS
20.0000 mg | ORAL_CAPSULE | Freq: Every day | ORAL | Status: DC
  Administered 2023-08-08: 20 mg via ORAL
  Filled 2023-08-07: qty 1

## 2023-08-07 MED ORDER — ACETAMINOPHEN 325 MG PO TABS: 650.0000 mg | ORAL_TABLET | Freq: Four times a day (QID) | ORAL | Status: DC | PRN

## 2023-08-07 MED ORDER — VANCOMYCIN HCL 2000 MG/400ML IV SOLN
2000.0000 mg | INTRAVENOUS | Status: DC
  Administered 2023-08-07 – 2023-08-08 (×2): 2000 mg via INTRAVENOUS
  Filled 2023-08-07 (×2): qty 400

## 2023-08-07 MED ORDER — ATORVASTATIN CALCIUM 40 MG PO TABS
40.0000 mg | ORAL_TABLET | Freq: Every day | ORAL | Status: DC
  Administered 2023-08-08 (×2): 40 mg via ORAL
  Filled 2023-08-07 (×2): qty 1

## 2023-08-07 MED ORDER — PANTOPRAZOLE SODIUM 40 MG PO TBEC: 40.0000 mg | DELAYED_RELEASE_TABLET | Freq: Every day | ORAL | Status: DC

## 2023-08-07 MED ORDER — INSULIN ASPART 100 UNIT/ML IJ SOLN: 0.0000 [IU] | Freq: Three times a day (TID) | INTRAMUSCULAR | Status: DC

## 2023-08-07 MED ORDER — ONDANSETRON HCL 4 MG PO TABS: 4.0000 mg | ORAL_TABLET | Freq: Four times a day (QID) | ORAL | Status: DC | PRN

## 2023-08-07 MED ORDER — BUPROPION HCL ER (XL) 150 MG PO TB24
300.0000 mg | ORAL_TABLET | Freq: Every morning | ORAL | Status: DC
  Administered 2023-08-08: 300 mg via ORAL
  Filled 2023-08-07: qty 2

## 2023-08-07 MED ORDER — PREGABALIN 100 MG PO CAPS
100.0000 mg | ORAL_CAPSULE | Freq: Three times a day (TID) | ORAL | Status: DC
  Administered 2023-08-08 (×4): 100 mg via ORAL
  Filled 2023-08-07 (×4): qty 1

## 2023-08-07 MED ORDER — LINEZOLID 600 MG PO TABS
600.0000 mg | ORAL_TABLET | Freq: Once | ORAL | Status: DC
  Filled 2023-08-07: qty 1

## 2023-08-07 NOTE — ED Provider Triage Note (Signed)
 Emergency Medicine Provider Triage Evaluation Note  Carmen Armstrong , a 58 y.o. female  was evaluated in triage.  Pt complains of foot infection.  Sent in by podiatry.  Review of Systems  Positive: As above Negative: As above  Physical Exam  BP 138/68 (BP Location: Right Arm)   Pulse 73   Temp 98.6 F (37 C)   Resp 18   Ht 5' 9 (1.753 m)   Wt 92.5 kg   SpO2 96%   BMI 30.13 kg/m  Gen:   Awake, no distress   Resp:  Normal effort  MSK:   Moves extremities without difficulty  Other:    Medical Decision Making  Medically screening exam initiated at 5:35 PM.  Appropriate orders placed.  Carmen Armstrong was informed that the remainder of the evaluation will be completed by another provider, this initial triage assessment does not replace that evaluation, and the importance of remaining in the ED until their evaluation is complete.     Hildegard Loge, PA-C 08/07/23 1737

## 2023-08-07 NOTE — ED Provider Notes (Addendum)
 [facility] EMERGENCY DEPARTMENT AT [facility] Provider Note   CSN: [id number] Arrival date & time: 08/07/23  1705     Patient presents with: Wound Infection   Carmen Armstrong is a 58 y.o. female.   HPI   58 year old female with medical history significant for diabetes mellitus and recent amputation of the great toe to the IPJ of the right foot, amputation of the second toe through the proximal phalanx head of the left foot presenting for a 10-week postoperative check at her podiatrist earlier today with significant pain and swelling to the right second toe.  There was increased swelling and some drainage from the toe.  She had some dead skin off the end of the toe as well.  No fevers or chills.  X-ray imaging was concerning for osteomyelitis and she was sent to the Emergency Department for further management.  Prior to Admission medications   Medication Sig Start Date End Date Taking? Authorizing Provider  AIRSUPRA 90-80 MCG/ACT AERO Inhale 2 puffs into the lungs in the morning and at bedtime. 04/23/23   [provider]  amLODipine  (NORVASC ) 10 MG tablet Take 1 tablet (10 mg total) by mouth daily. 03/22/23   Tolia, Sunit, DO  atorvastatin  (LIPITOR) 40 MG tablet Take 40 mg by mouth at bedtime. 08/08/19   [provider]  BELBUCA 150 MCG FILM Take 1 Film by mouth every 12 (twelve) hours. 07/02/23   [provider]  BELBUCA 300 MCG FILM Take by mouth 2 (two) times daily. 07/30/23   [provider]  buPROPion  (WELLBUTRIN  XL) 300 MG 24 hr tablet Take 300 mg by mouth every morning. 04/03/22   [provider]  candesartan (ATACAND) 4 MG tablet Take 4 mg by mouth daily. 08/13/19   [provider]  colchicine 0.6 MG tablet Take 0.6-1.2 mg by mouth See admin instructions. Take 1.2 mg for the first initial dose then 0.6 mg daily. 05/14/23   [provider]  ELIQUIS  5 MG TABS tablet Take 1 tablet (5 mg total) by mouth 2 (two) times  daily. 05/02/23   Tolia, Sunit, DO  FLUoxetine  (PROZAC ) 20 MG capsule Take 20 mg by mouth daily. 04/04/22   [provider]  fluticasone-salmeterol (ADVAIR) 250-50 MCG/ACT AEPB Inhale 1 puff into the lungs 2 (two) times daily. 05/04/23   [provider]  insulin  aspart (NOVOLOG  FLEXPEN) 100 UNIT/ML FlexPen Inject 10-20 Units into the skin 3 (three) times daily with meals. 04/16/23   Shamleffer, Donell Cardinal, MD  LINZESS  145 MCG CAPS capsule Take 145 mcg by mouth daily. 04/03/22   [provider]  methimazole  (TAPAZOLE ) 5 MG tablet Take 1 tablet (5 mg total) by mouth as directed. 1 tablet Monday through Saturday, none on Sundays 03/20/23   Shamleffer, Ibtehal Jaralla, MD  metoprolol  succinate (TOPROL -XL) 100 MG 24 hr tablet Take 100 mg by mouth daily. 06/12/19   [provider]  naloxone  (NARCAN ) nasal spray 4 mg/0.1 mL Place 1 spray into the nose as needed (OD).    [provider]  omeprazole (PRILOSEC) 20 MG capsule Take 20 mg by mouth daily. 06/15/21   [provider]  pregabalin  (LYRICA ) 100 MG capsule Take 1 capsule (100 mg total) by mouth 3 (three) times daily. 07/03/23 10/01/23  Standiford, Marsa FALCON, DPM  spironolactone  (ALDACTONE ) 25 MG tablet TAKE 1 TABLET BY MOUTH DAILY 06/22/23   Tolia, Sunit, DO  tirzepatide  (MOUNJARO ) 12.5 MG/0.5ML Pen Inject 12.5 mg into the skin once a week. 07/31/23  Shamleffer, Ibtehal Jaralla, MD  traMADol (ULTRAM) 50 MG tablet Take 50 mg by mouth every 8 (eight) hours as needed. 07/26/23   [provider]  TRESIBA  FLEXTOUCH 100 UNIT/ML FlexTouch Pen Inject 64 Units into the skin daily. Patient taking differently: Inject 54 Units into the skin daily.    [provider]  XIGDUO  XR 11-998 MG TB24 Take 2 tablets by mouth daily. 05/01/23   [provider]    Allergies: Other, Corylus, Omeprazole, Pear, Penicillin g, and Trulicity [dulaglutide]    Review of Systems  All other systems reviewed and  are negative.   Updated Vital Signs BP 138/68 (BP Location: Right Arm)   Pulse 73   Temp 98.6 F (37 C)   Resp 18   Ht 5' 9 (1.753 m)   Wt 92.5 kg   SpO2 96%   BMI 30.13 kg/m   Physical Exam Vitals and nursing note reviewed.  Constitutional:      General: She is not in acute distress. HENT:     Head: Normocephalic and atraumatic.   Eyes:     Conjunctiva/sclera: Conjunctivae normal.     Pupils: Pupils are equal, round, and reactive to light.    Cardiovascular:     Rate and Rhythm: Normal rate and regular rhythm.  Pulmonary:     Effort: Pulmonary effort is normal. No respiratory distress.  Abdominal:     General: There is no distension.     Tenderness: There is no guarding.   Musculoskeletal:        General: No deformity or signs of injury.     Cervical back: Neck supple.     Comments: Right toes with evidence of infection with drainage and fibrinous material, discoloration of the toes, status post amputation of the 1st and 2nd digits, 1+ DP pulses   Skin:    Capillary Refill: Capillary refill takes less than 2 seconds.     Findings: No lesion or rash.   Neurological:     General: No focal deficit present.     Mental Status: She is alert. Mental status is at baseline.      (all labs ordered are listed, but only abnormal results are displayed) Labs Reviewed  COMPREHENSIVE METABOLIC PANEL WITH GFR - Abnormal; Notable for the following components:      Result Value   Glucose, Bld 107 (*)    Albumin 3.3 (*)    All other components within normal limits  CBC WITH DIFFERENTIAL/PLATELET - Abnormal; Notable for the following components:   Hemoglobin 10.8 (*)    HCT 35.5 (*)    MCH 25.1 (*)    RDW 17.2 (*)    All other components within normal limits  I-STAT CG4 LACTIC ACID, ED - Abnormal; Notable for the following components:   Lactic Acid, Venous 2.2 (*)    All other components within normal limits  CULTURE, BLOOD (ROUTINE X 2)  CULTURE, BLOOD (ROUTINE X 2)   I-STAT CG4 LACTIC ACID, ED  CBG MONITORING, ED    EKG: None  Radiology: DG Foot Complete Right Result Date: 08/07/2023 Please see detailed radiograph report in office note.  DG Foot Complete Left Result Date: 08/07/2023 Please see detailed radiograph report in office note.    Procedures   Medications Ordered in the ED  sodium chloride  0.9 % bolus 500 mL (has no administration in time range)  metroNIDAZOLE  (FLAGYL ) tablet 500 mg (500 mg Oral Given 08/07/23 2017)  LORazepam (ATIVAN) tablet 1 mg (1 mg Oral  Given 08/07/23 2017)                                    Medical Decision Making Amount and/or Complexity of Data Reviewed Labs: ordered. Radiology: ordered.  Risk Prescription drug management. Decision regarding hospitalization.    58 year old female with medical history significant for diabetes mellitus and recent amputation of the great toe to the IPJ of the right foot, amputation of the second toe through the proximal phalanx head of the left foot presenting for a 10-week postoperative check at her podiatrist earlier today with significant pain and swelling to the right second toe.  There was increased swelling and some drainage from the toe.  She had some dead skin off the end of the toe as well.  No fevers or chills.  X-ray imaging was concerning for osteomyelitis and she was sent to the Emergency Department for further management.  On arrival, the patient was vitally stable, afebrile, not tachycardic or tachypneic.  Physical exam as per above.  X-ray imaging showing osteomyelitis and the patient was sent for antibiotics and potential amputation.  IV access was obtained and the patient was administered IV broad-spectrum antibiotics.  Laboratory evaluation revealed CBC without a leukocytosis, mild anemia to 10.8, blood cultures collected and in process, lactic acid elevated to 2.2, patient administered linezolid, Rocephin  and Flagyl .  Ativan administered for MRI imaging.   CMP was generally unremarkable.  Hospitalist medicine consulted for admission for further management of osteomyelitis, per podiatry notes, requested the patient be kept n.p.o. at midnight for likely procedure in the morning.  Patient not meeting SIRS criteria, hemodynamically stable at time of admission, Dr. Wes accepting.     Final diagnoses:  Osteomyelitis of right foot, unspecified type [facility])    ED Discharge Orders     None          Jerrol Agent, MD 08/07/23 2106    Jerrol Agent, MD 08/07/23 2129    Jerrol Agent, MD 08/07/23 2130

## 2023-08-07 NOTE — ED Notes (Signed)
 Patient given 2 cups of orange juice and a sandwich bag.

## 2023-08-07 NOTE — Progress Notes (Signed)
 Subjective:  Patient ID: Carmen Armstrong, female    DOB: [birth year],  MRN: [id number]  DOS: 05/22/2023 Procedure: 1. Amputation of great toe through IPJ, RIGHT foot 2. Amputation of second toe through proximal phalanx head, LEFT foot  58 y.o. female seen for post op check.  10 weeks postoperative.    Patient reports significant pain and swelling in the right second toe.  There is increased swelling and some drainage from the toe.  She does report that she did some dead skin off the end of the toe right second toe.  Review of Systems: Negative except as noted in the HPI. Denies N/V/F/Ch.   Objective:   There were no vitals filed for this visit.  There is no height or weight on file to calculate BMI. Constitutional Well developed. Well nourished.  Vascular Foot warm and well perfused. Capillary refill normal to all digits.   No calf pain with palpation  Neurologic Normal speech. Oriented to person, place, and time. Epicritic sensation diminished bilateral foot  Dermatologic Left second toe amputation fully healed without pain  Right hallux amputation site fully healed with some eschar and hyperkeratotic tissue and some darkening discoloration however not severe.  Right second toe with increased erythema dark discoloration and significant edema.  There is some ulceration of the distal tuft with concern for drainage.     Orthopedic: Status post right hallux amputation IPJ level and left second toe amputation PIPJ level   Radiographs: Left foot: There is been interval amputation of the second middle and distal phalanges as well as a portion of the proximal phalanx distally. Prior amputation of the first and fifth toes is noted.  XR 3 views AP lateral and oblique of the right foot -evidence of erosion osteolysis distal phalanx right second toe with concern for osteomyelitis, edema noted.  Proximal phalanx appears well-corticated without evidence of obvious erosion or osteolysis  though will need MRI evaluation as well  Pathology: A. TOE, LEFT SECOND, AMPUTATION:       Skin and subcutaneous tissue with chronic inflammation and  fibrosis.      Bone with marked reactive changes.       No evidence of acute osteomyelitis.       Surgical resection margin is viable without necrosis or  osteomyelitis.   B. TOE, RIGHT GREAT, AMPUTATION:       Chronic osteomyelitis.       Subcutaneous tissue with abscess formation.       Surgical resection margin is viable without necrosis or acute osteomyelitis.   Micro: STAPHYLOCOCCUS WARNERI   Assessment:   Osteomyelitis right hallux distal phalanx and left second toe distal phalanx status post amputation partially of both toes  Plan:  Patient was evaluated and treated and all questions answered.   10 wk s/p right hallux partial amputation and left second toe partial amputation -The right hallux amputation site appears fully healed however there is some eschar and mild dark discoloration around the amp site difficult to discern if this is infection versus typical post amputation site edema and pain -Left second toe amputation site fully healed without pain or other issues  #Right second toe ulceration with concern for underlying osteomyelitis - Significant worsening of the right second toe with much more edema dark discoloration and pain to the distal aspect of the toe especially with worsening ulceration and drainage concern for underlying osteomyelitis - XR right foot with erosion of the right second toe distal phalanx concerning for acute osteomyelitis - Discussed with  patient that given the severe pain she is having as well as the concern for osteomyelitis of the right second toe I would recommend she proceed to the emergency department for evaluation for IV antibiotics as well as MRI of the right toes to evaluate for osteomyelitis. - Please keep patient n.p.o. past midnight possible OR tomorrow afternoon for right second toe  amputation and possibly revision of the right hallux depending on MRI result.  Patient is in agreement with plan she will head to the [facility] emergency department following her appointment for admission        Marolyn JULIANNA Honour, DPM [facility] / [facility]

## 2023-08-07 NOTE — H&P (Incomplete)
 History and Physical    Carmen Armstrong FMW:[id number] DOB: 1965/03/16 DOA: 08/07/2023  PCP: Waylan Almarie SAUNDERS, MD   Patient coming from: Home   Chief Complaint:  Chief Complaint  Patient presents with   Wound Infection   ED TRIAGE note:  Sent by MD to ER for osteo of right foot.  Had partial amputation at beginning of April and was healing and now another part is draining and turning black.Denies fever.     HPI:  Carmen Armstrong is a 58 y.o. female with medical history significant of amputation of the right foot great toe and second toe secondary to osteomyelitis in April 2025 s/p great toe amputation and partial second amputation, insulin -dependent DM type II, normocytic anemia, peripheral neuropathy, essential hypertension, paroxysmal atrial fibrillation, chronic diastolic heart failure, CAD, PAD, and hyperlipidemia.  Patient has been seen at podiatry clinic evaluated by Dr. Malvin concern for right second toe ulceration and drainage with development of osteomyelitis of the second toe.  Per chart review of the podiatry outpatient note recommended IV antibiotic, MRI of the right toes to evaluate for osteomyelitis, keep patient n.p.o. with possible debridement or amputation tomorrow based on the MRI result.  ED Course:  At presentation to ED patient is hemodynamically stable Elevated lactic acid 2.2 blood cultures are in process.  CBC no evidence of leukocytosis, stable H&H, platelet count.  CMP showing blood glucose 107 and low albumin 3.3.  MRI of the right foot has been ordered.  In the ED patient has been given 500 mL of NS bolus, ceftriaxone , metronidazole  and linezolid.  Hospitalist has been consulted for further evaluation management of concern for diabetic foot infection of the amputation site and concern for possible redevelopment of osteomyelitis.   Significant labs in the ED: Lab Orders         Blood Cultures x 2 sites         Comprehensive metabolic  panel         CBC with Differential         CBC         Protime-INR         APTT         Basic metabolic panel         Lactic acid, plasma         POC CBG, ED       Review of Systems:  Review of Systems  Constitutional:  Negative for chills, fever, malaise/fatigue and weight loss.  Cardiovascular:  Negative for chest pain, palpitations, orthopnea and claudication.  Gastrointestinal:  Negative for abdominal pain, diarrhea, heartburn, nausea and vomiting.  Genitourinary:  Negative for dysuria, flank pain, frequency, hematuria and urgency.  Musculoskeletal:  Negative for back pain, falls, joint pain, myalgias and neck pain.       Increased drainage from the right lower extremity amputation area.  Neurological:  Negative for dizziness and weakness.  Psychiatric/Behavioral:  The patient is not nervous/anxious.     Past Medical History:  Diagnosis Date   Arrhythmia    Arthritis    Atrial fibrillation (HCC)    CAD (coronary artery disease)    Chronic lower back pain    Depression    Diabetes mellitus without complication (HCC)    Eczema    GAD (generalized anxiety disorder)    GERD (gastroesophageal reflux disease)    H/O vitamin D deficiency    Heart failure (HCC)    Hyperlipidemia    Hypertension    Hypothyroidism  Insomnia    Peripheral neuropathy    PVD (peripheral vascular disease) [facility])     Past Surgical History:  Procedure Laterality Date   ABDOMINAL AORTOGRAM W/LOWER EXTREMITY Bilateral 05/21/2023   Procedure: ABDOMINAL AORTOGRAM W/LOWER EXTREMITY;  Surgeon: Pearline Norman RAMAN, MD;  Location: [facility] INVASIVE CV LAB;  Service: Cardiovascular;  Laterality: Bilateral;   AMPUTATION Right 05/22/2023   Procedure: AMPUTATION, FOOT, RAY;  Surgeon: Malvin Marsa FALCON, DPM;  Location: [facility] OR;  Service: Orthopedics/Podiatry;  Laterality: Right;  Left second toe partial amp   AMPUTATION TOE Left 05/22/2023   Procedure: AMPUTATION, TOE;  Surgeon: Malvin Marsa FALCON, DPM;   Location: [facility] OR;  Service: Orthopedics/Podiatry;  Laterality: Left;  Right great toe partial amputation   ANGIOPLASTY     legs   BACK SURGERY     5 different times   BREAST BIOPSY Right 07/24/2022   MM RT BREAST BX W LOC DEV 1ST LESION IMAGE BX SPEC STEREO GUIDE 07/24/2022 [facility] MAMMOGRAPHY   CARDIAC CATHETERIZATION  2020   REPLACEMENT TOTAL HIP W/  RESURFACING IMPLANTS Right 2018   RIGHT AND LEFT HEART CATH     ROTATOR CUFF REPAIR Right 2016   SPINAL CORD STIMULATOR INSERTION N/A 05/08/2022   Procedure: PLACEMENT OF SPINAL CORD STIMULATOR;  Surgeon: Burnetta Aures, MD;  Location: [facility] OR;  Service: Orthopedics;  Laterality: N/A;   SPINE SURGERY  2014,2016,2017,2019   toe amputated  09/2018   5th left toe   TOE AMPUTATION Left    great toe on left side     reports that she has been smoking cigarettes. She has a 15 pack-year smoking history. She has never used smokeless tobacco. She reports that she does not currently use alcohol. She reports that she does not currently use drugs.  Allergies  Allergen Reactions   Other Shortness Of Breath    Hazelnuts   Corylus Other (See Comments)    Unknown    Omeprazole Other (See Comments)    Unknown    Pear Other (See Comments)    Unknown    Penicillin G Other (See Comments)    Whelps - childhood allergy   Trulicity [Dulaglutide] Other (See Comments)    Large itchy bumps    Family History  Problem Relation Age of Onset   Hypertension Mother    Hyperlipidemia Mother    Diabetes Mother    Stroke Mother    Clotting disorder Father    Hypertension Father    Hyperlipidemia Father    Hyperlipidemia Sister    Diabetes Brother    Neuropathy Brother     Prior to Admission medications   Medication Sig Start Date End Date Taking? Authorizing Provider  AIRSUPRA 90-80 MCG/ACT AERO Inhale 2 puffs into the lungs in the morning and at bedtime. 04/23/23   [provider]  amLODipine  (NORVASC ) 10 MG tablet Take 1 tablet (10 mg total) by  mouth daily. 03/22/23   Tolia, Sunit, DO  atorvastatin  (LIPITOR) 40 MG tablet Take 40 mg by mouth at bedtime. 08/08/19   [provider]  BELBUCA 150 MCG FILM Take 1 Film by mouth every 12 (twelve) hours. 07/02/23   [provider]  BELBUCA 300 MCG FILM Take by mouth 2 (two) times daily. 07/30/23   [provider]  buPROPion  (WELLBUTRIN  XL) 300 MG 24 hr tablet Take 300 mg by mouth every morning. 04/03/22   [provider]  candesartan (ATACAND) 4 MG tablet Take 4 mg by mouth daily. 08/13/19   [provider]  colchicine 0.6 MG tablet Take 0.6-1.2 mg by mouth See admin instructions. Take 1.2 mg for the first initial dose then 0.6 mg daily. 05/14/23   [provider]  ELIQUIS  5 MG TABS tablet Take 1 tablet (5 mg total) by mouth 2 (two) times daily. 05/02/23   Tolia, Sunit, DO  FLUoxetine  (PROZAC ) 20 MG capsule Take 20 mg by mouth daily. 04/04/22   [provider]  fluticasone-salmeterol (ADVAIR) 250-50 MCG/ACT AEPB Inhale 1 puff into the lungs 2 (two) times daily. 05/04/23   [provider]  insulin  aspart (NOVOLOG  FLEXPEN) 100 UNIT/ML FlexPen Inject 10-20 Units into the skin 3 (three) times daily with meals. 04/16/23   Shamleffer, Donell Cardinal, MD  LINZESS  145 MCG CAPS capsule Take 145 mcg by mouth daily. 04/03/22   [provider]  methimazole  (TAPAZOLE ) 5 MG tablet Take 1 tablet (5 mg total) by mouth as directed. 1 tablet Monday through Saturday, none on Sundays 03/20/23   Shamleffer, Ibtehal Jaralla, MD  metoprolol  succinate (TOPROL -XL) 100 MG 24 hr tablet Take 100 mg by mouth daily. 06/12/19   [provider]  naloxone  (NARCAN ) nasal spray 4 mg/0.1 mL Place 1 spray into the nose as needed (OD).    [provider]  omeprazole (PRILOSEC) 20 MG capsule Take 20 mg by mouth daily. 06/15/21   [provider]  pregabalin  (LYRICA ) 100 MG capsule Take 1 capsule (100 mg total) by mouth 3 (three) times daily. 07/03/23  10/01/23  Standiford, Marsa FALCON, DPM  spironolactone  (ALDACTONE ) 25 MG tablet TAKE 1 TABLET BY MOUTH DAILY 06/22/23   Tolia, Sunit, DO  tirzepatide  (MOUNJARO ) 12.5 MG/0.5ML Pen Inject 12.5 mg into the skin once a week. 07/31/23   Shamleffer, Ibtehal Jaralla, MD  traMADol (ULTRAM) 50 MG tablet Take 50 mg by mouth every 8 (eight) hours as needed. 07/26/23   [provider]  TRESIBA  FLEXTOUCH 100 UNIT/ML FlexTouch Pen Inject 64 Units into the skin daily. Patient taking differently: Inject 54 Units into the skin daily.    [provider]  XIGDUO  XR 11-998 MG TB24 Take 2 tablets by mouth daily. 05/01/23   [provider]     Physical Exam: Vitals:   08/07/23 1720 08/07/23 1727  BP: 138/68   Pulse: 73   Resp: 18   Temp: 98.6 F (37 C)   SpO2: 96%   Weight:  92.5 kg  Height:  5' 9 (1.753 m)    Physical Exam Vitals and nursing note reviewed.  Constitutional:      Appearance: Normal appearance.  HENT:     Head: Normocephalic and atraumatic.     Mouth/Throat:     Mouth: Mucous membranes are moist.   Eyes:     Pupils: Pupils are equal, round, and reactive to light.    Cardiovascular:     Rate and Rhythm: Normal rate and regular rhythm.     Pulses: Normal pulses.     Heart sounds: Normal heart sounds.  Pulmonary:     Effort: Pulmonary effort is normal.     Breath sounds: Normal breath sounds.  Abdominal:     Palpations: Abdomen is soft.   Musculoskeletal:     Cervical back: Neck supple.     Right lower leg: No edema.     Left lower leg: No edema.     Comments: Right lower extremity amputation site drainage   Skin:    Capillary Refill: Capillary refill takes less than 2 seconds.   Neurological:  Mental Status: She is alert and oriented to person, place, and time.      Labs on Admission: I have personally reviewed following labs and imaging studies  CBC: Recent Labs  Lab 08/07/23 1730  WBC 7.2  NEUTROABS 3.2  HGB 10.8*  HCT 35.5*   MCV 82.4  PLT 180   Basic Metabolic Panel: Recent Labs  Lab 08/07/23 1730  NA 140  K 4.1  CL 106  CO2 22  GLUCOSE 107*  BUN 8  CREATININE 0.91  CALCIUM  9.1   GFR: Estimated Creatinine Clearance: 81.6 mL/min (by C-G formula based on SCr of 0.91 mg/dL). Liver Function Tests: Recent Labs  Lab 08/07/23 1730  AST 15  ALT 10  ALKPHOS 69  BILITOT 0.4  PROT 7.0  ALBUMIN 3.3*   No results for input(s): LIPASE, AMYLASE in the last 168 hours. No results for input(s): AMMONIA in the last 168 hours. Coagulation Profile: No results for input(s): INR, PROTIME in the last 168 hours. Cardiac Enzymes: No results for input(s): CKTOTAL, CKMB, CKMBINDEX, TROPONINI, TROPONINIHS in the last 168 hours. BNP (last 3 results) Recent Labs    04/19/23 2034  BNP 56.1   HbA1C: No results for input(s): HGBA1C in the last 72 hours. CBG: No results for input(s): GLUCAP in the last 168 hours. Lipid Profile: No results for input(s): CHOL, HDL, LDLCALC, TRIG, CHOLHDL, LDLDIRECT in the last 72 hours. Thyroid  Function Tests: No results for input(s): TSH, T4TOTAL, FREET4, T3FREE, THYROIDAB in the last 72 hours. Anemia Panel: No results for input(s): VITAMINB12, FOLATE, FERRITIN, TIBC, IRON, RETICCTPCT in the last 72 hours. Urine analysis:    Component Value Date/Time   COLORURINE YELLOW 05/15/2023 2212   APPEARANCEUR HAZY (A) 05/15/2023 2212   LABSPEC 1.013 05/15/2023 2212   PHURINE 5.0 05/15/2023 2212   GLUCOSEU >=500 (A) 05/15/2023 2212   HGBUR NEGATIVE 05/15/2023 2212   BILIRUBINUR NEGATIVE 05/15/2023 2212   KETONESUR NEGATIVE 05/15/2023 2212   PROTEINUR NEGATIVE 05/15/2023 2212   NITRITE NEGATIVE 05/15/2023 2212   LEUKOCYTESUR TRACE (A) 05/15/2023 2212    Radiological Exams on Admission: I have personally reviewed images DG Foot Complete Right Result Date: 08/07/2023 Please see detailed radiograph report in office  note.  DG Foot Complete Left Result Date: 08/07/2023 Please see detailed radiograph report in office note.    EKG: Pending EKG    Assessment/Plan: Principal Problem:   Diabetic foot infection (HCC) Active Problems:   History of amputation   Hyperlipidemia   Paroxysmal atrial fibrillation (HCC)   Insulin  dependent type 2 diabetes mellitus (HCC)   Normocytic anemia   Peripheral neuropathy   Chronic diastolic CHF (congestive heart failure) (HCC)   History of CAD (coronary artery disease)   Peripheral artery disease (HCC)   Generalized anxiety disorder   Hyperthyroidism   Reactive airway disease    Assessment and Plan: Diabetic foot infection History of osteomyelitis status post amputation of the right foot great toe and partial amputation of the second toe -Patient has been referred from podiatry clinic with concern for redevelopment of diabetic foot infection/osteomyelitis of the amputation site.  At presentation today patient is hemodynamically stable.  CBC no evidence of leukocytosis, however amount of elevated lactic acid 2.3. - Dr. Malvin has been seen in the clinic recommended MRI of the foot, IV antibiotic, keep patient n.p.o. and based on the MRI result will decide further course of care may be need amputation tomorrow. - In the ED blood cultures has been obtained, patient received 500  mL of LR bolus - Continue broad-spectrum antibiotic coverage with IV vancomycin , cefepime and metronidazole .  (Due to penicillin allergy holding Zosyn however in the ED patient tolerated ceftriaxone  so safely can continue cefepime). -Holding Eliquis  in case patient will undergo any amputation tomorrow.  Keep patient n.p.o. after midnight.  Paroxysmal atrial fibrillation - Obtaining EKG.  Continue Toprol -XL 100 mg daily.  Holding Eliquis .  If MRI shows evidence of osteomyelitis patient will undergo possible amputation.  Hyperlipidemia - Continue Lipitor  Insulin -dependent DM type  II - Most recent A1c 6.1 in April 2025.  Continue sliding scale insulin  coverage with mealtime.  Holding oral antidiabetic agent given patient will be n.p.o. after midnight.  Normocytic anemia -Stable H&H 10.8 and 35.  Continue to monitor  Peripheral neuropathy -Continue gabapentin   Chronic diastolic heart failure -In the setting of borderline soft blood pressure holding spironolactone  and candesartan.  Continue Toprol -XL  History of CAD History of PAD Hyperlipidemia - Continue statin.  Eliquis  on hold for possible surgical intervention tomorrow  Hyperthyroidism -Continue methimazole   Generalized anxiety disorder - Continue Prozac  and Wellbutrin   Reactive airway disease -Continue  Chronic pain syndrome Chronic opiate withdrawal and use -Reviewed PDMP.   Patient is on Belbuca 300 mcg twice daily. -Continue tramadol as needed  DVT prophylaxis:  SCDs Code Status:  Full Code Diet: Heart healthy carb modified diet will be n.p.o. after midnight Family Communication:   Family was present at bedside, at the time of interview. Opportunity was given to ask question and all questions were answered satisfactorily.  Disposition Plan: Based on the MRI result can decide further course of care possible amputation if it shows osteomyelitis. Consults: Podiatry Admission status:   Inpatient, Telemetry bed  Severity of Illness: The appropriate patient status for this patient is INPATIENT. Inpatient status is judged to be reasonable and necessary in order to provide the required intensity of service to ensure the patient's safety. The patient's presenting symptoms, physical exam findings, and initial radiographic and laboratory data in the context of their chronic comorbidities is felt to place them at high risk for further clinical deterioration. Furthermore, it is not anticipated that the patient will be medically stable for discharge from the hospital within 2 midnights of admission.   * I  certify that at the point of admission it is my clinical judgment that the patient will require inpatient hospital care spanning beyond 2 midnights from the point of admission due to high intensity of service, high risk for further deterioration and high frequency of surveillance required.DEWAINE    Starletta Houchin, MD [facility]  How to contact the [facility] Attending or Consulting provider 7A - 7P or covering provider during after hours 7P -7A, for this patient.  Check the care team in [facility] and look for a) attending/consulting [facility] provider listed and b) the [facility] team listed Log into [email or web address] and use [place]'s universal password to access. If you do not have the password, please contact the hospital operator. Locate the [facility] provider you are looking for under [facility] and page to a number that you can be directly reached. If you still have difficulty reaching the provider, please page the [facility] (Director on Call) for the Hospitalists listed on amion for assistance.  08/07/2023, 9:57 PM

## 2023-08-07 NOTE — ED Triage Notes (Signed)
 Sent by MD to ER for osteo of right foot.  Had partial amputation at beginning of April and was healing and now another part is draining and turning black.Denies fever.

## 2023-08-07 NOTE — ED Notes (Signed)
 Patient in MRI at this time.

## 2023-08-07 NOTE — Progress Notes (Signed)
 Pharmacy Antibiotic Note  Carmen Armstrong is a 58 y.o. female for which pharmacy has been consulted for cefepime and vancomycin  dosing for wound infection.  Patient with a history of DM and recent amputation of great toe. Patient presenting after being sent from post-operative check up w/ c/f osteomyelitis.  SCr 0.91 WBC 7.2; LA 2.2; T 98.6; HR 73; RR 18  Plan: Metronidazole  per MD Cefepime 2g q8hr  Vancomycin  2000 mg once q24hr (eAUC 477.8) unless change in renal function Monitor WBC, fever, renal function, cultures De-escalate when able Levels at steady state  Height: 5' 9 (175.3 cm) Weight: 92.5 kg (204 lb) IBW/kg (Calculated) : 66.2  Temp (24hrs), Avg:98.6 F (37 C), Min:98.6 F (37 C), Max:98.6 F (37 C)  Recent Labs  Lab 08/07/23 1730 08/07/23 1742 08/07/23 1953  WBC 7.2  --   --   CREATININE 0.91  --   --   LATICACIDVEN  --  1.5 2.2*    Estimated Creatinine Clearance: 81.6 mL/min (by C-G formula based on SCr of 0.91 mg/dL).    Allergies  Allergen Reactions   Other Shortness Of Breath    Hazelnuts   Corylus Other (See Comments)    Unknown    Omeprazole Other (See Comments)    Unknown    Pear Other (See Comments)    Unknown    Penicillin G Other (See Comments)    Whelps - childhood allergy   Trulicity [Dulaglutide] Other (See Comments)    Large itchy bumps   Microbiology results: Pending  Thank you for allowing pharmacy to be a part of this patient's care.  Dorn Buttner, PharmD, BCPS 08/07/2023 9:36 PM ED Clinical Pharmacist -  [phone]

## 2023-08-08 ENCOUNTER — Inpatient Hospital Stay (HOSPITAL_COMMUNITY)

## 2023-08-08 ENCOUNTER — Inpatient Hospital Stay (HOSPITAL_COMMUNITY): Admitting: Anesthesiology

## 2023-08-08 ENCOUNTER — Encounter (HOSPITAL_COMMUNITY)
Admission: EM | Disposition: A | Discharge status: Home / Self Care | Payer: Self-pay | Source: Ambulatory Visit | Attending: Internal Medicine

## 2023-08-08 DIAGNOSIS — E11628 Type 2 diabetes mellitus with other skin complications: Secondary | ICD-10-CM | POA: Diagnosis not present

## 2023-08-08 DIAGNOSIS — F17210 Nicotine dependence, cigarettes, uncomplicated: Secondary | ICD-10-CM | POA: Diagnosis not present

## 2023-08-08 DIAGNOSIS — E1169 Type 2 diabetes mellitus with other specified complication: Secondary | ICD-10-CM | POA: Diagnosis not present

## 2023-08-08 DIAGNOSIS — Z899 Acquired absence of limb, unspecified: Secondary | ICD-10-CM | POA: Diagnosis not present

## 2023-08-08 DIAGNOSIS — I2510 Atherosclerotic heart disease of native coronary artery without angina pectoris: Secondary | ICD-10-CM | POA: Diagnosis not present

## 2023-08-08 DIAGNOSIS — M869 Osteomyelitis, unspecified: Secondary | ICD-10-CM | POA: Diagnosis not present

## 2023-08-08 DIAGNOSIS — I5032 Chronic diastolic (congestive) heart failure: Secondary | ICD-10-CM | POA: Diagnosis not present

## 2023-08-08 HISTORY — PX: AMPUTATION TOE: SHX6595

## 2023-08-08 LAB — BASIC METABOLIC PANEL WITH GFR
Anion gap: 4 — ABNORMAL LOW (ref 5–15)
BUN: 7 mg/dL (ref 6–20)
CO2: 25 mmol/L (ref 22–32)
Calcium: 8.5 mg/dL — ABNORMAL LOW (ref 8.9–10.3)
Chloride: 109 mmol/L (ref 98–111)
Creatinine, Ser: 0.81 mg/dL (ref 0.44–1.00)
GFR, Estimated: >60 mL/min (ref >60)
Glucose, Bld: 101 mg/dL — ABNORMAL HIGH (ref 70–99)
Potassium: 3.8 mmol/L (ref 3.5–5.1)
Sodium: 138 mmol/L (ref 135–145)

## 2023-08-08 LAB — CBG MONITORING, ED
Glucose-Capillary: 46 mg/dL — ABNORMAL LOW (ref 70–99)
Glucose-Capillary: 67 mg/dL — ABNORMAL LOW (ref 70–99)
Glucose-Capillary: 94 mg/dL (ref 70–99)
Glucose-Capillary: 80 mg/dL (ref 70–99)
Glucose-Capillary: 77 mg/dL (ref 70–99)
Glucose-Capillary: 69 mg/dL — ABNORMAL LOW (ref 70–99)
Glucose-Capillary: 139 mg/dL — ABNORMAL HIGH (ref 70–99)

## 2023-08-08 LAB — GLUCOSE, CAPILLARY
Glucose-Capillary: 115 mg/dL — ABNORMAL HIGH (ref 70–99)
Glucose-Capillary: 90 mg/dL (ref 70–99)
Glucose-Capillary: 55 mg/dL — ABNORMAL LOW (ref 70–99)
Glucose-Capillary: 56 mg/dL — ABNORMAL LOW (ref 70–99)
Glucose-Capillary: 77 mg/dL (ref 70–99)
Glucose-Capillary: 135 mg/dL — ABNORMAL HIGH (ref 70–99)
Glucose-Capillary: 158 mg/dL — ABNORMAL HIGH (ref 70–99)

## 2023-08-08 LAB — CBC
HCT: 34.5 % — ABNORMAL LOW (ref 36.0–46.0)
Hemoglobin: 10.3 g/dL — ABNORMAL LOW (ref 12.0–15.0)
MCH: 24.8 pg — ABNORMAL LOW (ref 26.0–34.0)
MCHC: 29.9 g/dL — ABNORMAL LOW (ref 30.0–36.0)
MCV: 83.1 fL (ref 80.0–100.0)
Platelets: 166 10*3/uL (ref 150–400)
RBC: 4.15 MIL/uL (ref 3.87–5.11)
RDW: 17.5 % — ABNORMAL HIGH (ref 11.5–15.5)
WBC: 6.5 10*3/uL (ref 4.0–10.5)
nRBC: 0 % (ref 0.0–0.2)

## 2023-08-08 LAB — APTT: aPTT: 23 s — ABNORMAL LOW (ref 24–36)

## 2023-08-08 LAB — PROTIME-INR
INR: 1 (ref 0.8–1.2)
Prothrombin Time: 13.4 s (ref 11.4–15.2)

## 2023-08-08 LAB — LACTIC ACID, PLASMA
Lactic Acid, Venous: 1.9 mmol/L (ref 0.5–1.9)
Lactic Acid, Venous: 1.7 mmol/L (ref 0.5–1.9)

## 2023-08-08 SURGERY — AMPUTATION, TOE
Anesthesia: Monitor Anesthesia Care | Site: Toe | Laterality: Right

## 2023-08-08 MED ORDER — LIDOCAINE HCL (PF) 1 % IJ SOLN
INTRAMUSCULAR | Status: AC
  Filled 2023-08-08: qty 30

## 2023-08-08 MED ORDER — AMISULPRIDE (ANTIEMETIC) 5 MG/2ML IV SOLN: 10.0000 mg | Freq: Once | INTRAVENOUS | Status: DC | PRN

## 2023-08-08 MED ORDER — SODIUM CHLORIDE 0.9 % IV SOLN
2.0000 g | INTRAVENOUS | Status: DC
  Administered 2023-08-08: 2 g via INTRAVENOUS
  Filled 2023-08-08: qty 20

## 2023-08-08 MED ORDER — ONDANSETRON HCL 4 MG/2ML IJ SOLN
INTRAMUSCULAR | Status: DC | PRN
  Administered 2023-08-08: 4 mg via INTRAVENOUS

## 2023-08-08 MED ORDER — OXYCODONE HCL 5 MG PO TABS: 5.0000 mg | ORAL_TABLET | Freq: Once | ORAL | Status: DC | PRN

## 2023-08-08 MED ORDER — LIDOCAINE HCL 1 % IJ SOLN
INTRAMUSCULAR | Status: DC | PRN
  Administered 2023-08-08: 10 mL via SURGICAL_CAVITY

## 2023-08-08 MED ORDER — OXYCODONE HCL 5 MG/5ML PO SOLN: 5.0000 mg | Freq: Once | ORAL | Status: DC | PRN

## 2023-08-08 MED ORDER — DEXTROSE-SODIUM CHLORIDE 5-0.45 % IV SOLN: INTRAVENOUS | Status: AC

## 2023-08-08 MED ORDER — 0.9 % SODIUM CHLORIDE (POUR BTL) OPTIME
TOPICAL | Status: DC | PRN
  Administered 2023-08-08: 1000 mL

## 2023-08-08 MED ORDER — CHLORHEXIDINE GLUCONATE 0.12 % MT SOLN: 15.0000 mL | Freq: Once | OROMUCOSAL | Status: AC

## 2023-08-08 MED ORDER — DEXTROSE 50 % IV SOLN
12.5000 g | INTRAVENOUS | Status: AC
  Administered 2023-08-08: 12.5 g via INTRAVENOUS
  Filled 2023-08-08: qty 50

## 2023-08-08 MED ORDER — PROPOFOL 10 MG/ML IV BOLUS
INTRAVENOUS | Status: DC | PRN
  Administered 2023-08-08: 20 mg via INTRAVENOUS

## 2023-08-08 MED ORDER — CHLORHEXIDINE GLUCONATE 0.12 % MT SOLN
OROMUCOSAL | Status: AC
  Administered 2023-08-08: 15 mL via OROMUCOSAL
  Filled 2023-08-08: qty 15

## 2023-08-08 MED ORDER — MIDAZOLAM HCL 2 MG/2ML IJ SOLN
INTRAMUSCULAR | Status: DC | PRN
  Administered 2023-08-08: 2 mg via INTRAVENOUS

## 2023-08-08 MED ORDER — PROPOFOL 500 MG/50ML IV EMUL
INTRAVENOUS | Status: DC | PRN
  Administered 2023-08-08: 100 ug/kg/min via INTRAVENOUS

## 2023-08-08 MED ORDER — MIDAZOLAM HCL 2 MG/2ML IJ SOLN
INTRAMUSCULAR | Status: AC
  Filled 2023-08-08: qty 2

## 2023-08-08 MED ORDER — ORAL CARE MOUTH RINSE: 15.0000 mL | Freq: Once | OROMUCOSAL | Status: AC

## 2023-08-08 MED ORDER — FENTANYL CITRATE (PF) 100 MCG/2ML IJ SOLN: 25.0000 ug | INTRAMUSCULAR | Status: DC | PRN

## 2023-08-08 MED ORDER — DEXTROSE 50 % IV SOLN: 50.0000 mL | INTRAVENOUS | Status: DC | PRN

## 2023-08-08 MED ORDER — LACTATED RINGERS IV SOLN: INTRAVENOUS | Status: DC

## 2023-08-08 MED ORDER — DEXTROSE 50 % IV SOLN
25.0000 g | INTRAVENOUS | Status: DC | PRN
  Administered 2023-08-08 (×2): 25 g via INTRAVENOUS
  Filled 2023-08-08 (×2): qty 50

## 2023-08-08 MED ORDER — FENTANYL CITRATE (PF) 100 MCG/2ML IJ SOLN
INTRAMUSCULAR | Status: AC
  Filled 2023-08-08: qty 2

## 2023-08-08 MED ORDER — BUPIVACAINE HCL (PF) 0.5 % IJ SOLN
INTRAMUSCULAR | Status: AC
  Filled 2023-08-08: qty 30

## 2023-08-08 SURGICAL SUPPLY — 37 items
BLADE AVERAGE 25X9 (BLADE) IMPLANT
BLADE SURG 10 STRL SS (BLADE) ×1 IMPLANT
BLADE SURG 15 STRL LF DISP TIS (BLADE) ×1 IMPLANT
BNDG COHESIVE 3X5 TAN ST LF (GAUZE/BANDAGES/DRESSINGS) ×1 IMPLANT
BNDG COMPR ESMARK 4X3 LF (GAUZE/BANDAGES/DRESSINGS) ×1 IMPLANT
BNDG ELASTIC 3INX 5YD STR LF (GAUZE/BANDAGES/DRESSINGS) ×1 IMPLANT
BNDG ELASTIC 4INX 5YD STR LF (GAUZE/BANDAGES/DRESSINGS) IMPLANT
BNDG GAUZE DERMACEA FLUFF 4 (GAUZE/BANDAGES/DRESSINGS) IMPLANT
BNDG STRETCH 4X75 NS LF (GAUZE/BANDAGES/DRESSINGS) IMPLANT
CHLORAPREP W/TINT 26 (MISCELLANEOUS) IMPLANT
DRSG ADAPTIC 3X8 NADH LF (GAUZE/BANDAGES/DRESSINGS) IMPLANT
DRSG XEROFORM 1X8 (GAUZE/BANDAGES/DRESSINGS) IMPLANT
ELECTRODE REM PT RTRN 9FT ADLT (ELECTROSURGICAL) ×1 IMPLANT
GAUZE PAD ABD 8X10 STRL (GAUZE/BANDAGES/DRESSINGS) IMPLANT
GAUZE SPONGE 2X2 STRL 8-PLY (GAUZE/BANDAGES/DRESSINGS) IMPLANT
GAUZE SPONGE 4X4 12PLY STRL (GAUZE/BANDAGES/DRESSINGS) ×1 IMPLANT
GAUZE STRETCH 2X75IN STRL (MISCELLANEOUS) ×1 IMPLANT
GAUZE XEROFORM 1X8 LF (GAUZE/BANDAGES/DRESSINGS) ×1 IMPLANT
GLOVE BIO SURGEON STRL SZ7.5 (GLOVE) ×1 IMPLANT
GLOVE BIOGEL PI IND STRL 7.5 (GLOVE) ×1 IMPLANT
GOWN STRL REUS W/ TWL LRG LVL3 (GOWN DISPOSABLE) ×2 IMPLANT
KIT BASIN OR (CUSTOM PROCEDURE TRAY) ×1 IMPLANT
NDL HYPO 25X1 1.5 SAFETY (NEEDLE) ×1 IMPLANT
NEEDLE HYPO 25X1 1.5 SAFETY (NEEDLE) ×1 IMPLANT
PACK ORTHO EXTREMITY (CUSTOM PROCEDURE TRAY) ×1 IMPLANT
PADDING CAST ABS COTTON 4X4 ST (CAST SUPPLIES) ×2 IMPLANT
SET HNDPC FAN SPRY TIP SCT (DISPOSABLE) IMPLANT
SPIKE FLUID TRANSFER (MISCELLANEOUS) IMPLANT
STOCKINETTE 4X48 STRL (DRAPES) IMPLANT
SUT ETHILON 3 0 FSLX (SUTURE) IMPLANT
SUT PROLENE 3 0 PS 2 (SUTURE) IMPLANT
SUT PROLENE 4 0 PS 2 18 (SUTURE) IMPLANT
SYR CONTROL 10ML LL (SYRINGE) ×1 IMPLANT
TUBE CONNECTING 12X1/4 (SUCTIONS) IMPLANT
UNDERPAD 30X36 HEAVY ABSORB (UNDERPADS AND DIAPERS) ×1 IMPLANT
WATER STERILE IRR 1000ML POUR (IV SOLUTION) ×1 IMPLANT
YANKAUER SUCT BULB TIP NO VENT (SUCTIONS) IMPLANT

## 2023-08-08 NOTE — Op Note (Signed)
 Full Operative Report  Date of Operation: 3:12 PM, 08/08/2023   Patient: Carmen Armstrong - 58 y.o. female  Surgeon: Malvin Marsa FALCON, DPM   Assistant: None  Diagnosis: Osteomyelitis of right 2nd toe  Procedure:  1. Amputation 2nd toe mid proximal phalanx level, right foot    Anesthesia: Anesthesia type not filed in the log.  No responsible provider has been recorded for the case.  Anesthesiologist: Peggye Delon Brunswick, MD CRNA: Virgil Ee, CRNA   Estimated Blood Loss: Minimal   Hemostasis: 1) Anatomical dissection, mechanical compression, electrocautery 2) no tourniquet was used  Implants: * No implants in log *  Materials: prolene 3-0  Injectables: 1) Pre-operatively: 10 cc of 50:50 mixture 1%lidocaine  plain and 0.5% marcaine  plain 2) Post-operatively: None  Specimens: - Pathology: Righ 2nd toe - Microbiology: bone culture R distal 2nd toe   Antibiotics: IV antibiotics given per schedule on the floor  Drains: None  Complications: Patient tolerated the procedure well without complication.   Operative findings: As below in detailed report  Indications for Procedure: Carmen Armstrong presents to [employer], Marsa FALCON, DPM with a chief complaint of ulceration right 2nd toe with evidence of Osteomyelitis distal phalanx on MRI. The patient has failed conservative treatments of various modalities. At this time the patient has elected to proceed with surgical correction. All alternatives, risks, and complications of the procedures were thoroughly explained to the patient. Patient exhibits appropriate understanding of all discussion points and informed consent was signed and obtained in the chart with no guarantees to surgical outcome given or implied.  Description of Procedure: Patient was brought to the operating room. Patient remained on their hospital bed in the supine position. A surgical timeout was performed and all members of the operating  room, the procedure, and the surgical site were identified. anesthesia occurred as per anesthesia record. Local anesthetic as previously described was then injected about the operative field in a local infiltrative block.  The operative lower extremity as noted above was then prepped and draped in the usual sterile manner. The following procedure then began.  Attention was directed to the 2nd digit on the RIGHT foot. A full-thickness incision encompassing the entire digit was made using a #15 blade. There was some purulence noted upon skin incision released from about the pipj level. Dissection was carried down to bone. The toe was secured with a towel clamp, further dissected in its entirety, and disarticulated at the PIPJ and passed to the back table as a gross specimen. This was then labled and sent to pathology. The bone was noted to be soft and eroded distally, and consistent with osteomyelitis, a bone culture was harvested and sent for micro. All remaining necrotic and devitalized soft tissue structures were visualized and dissected away using sharp and dull dissection. The proximal phalanx was resected mid level with bone cutting forceps. Care was taken to protect all neurovascular structures throughout the dissection. All bleeders were cauterized as necessary. The area was then flushed with copious amounts of sterile saline. Then using the suture materials previously described, the site was closed in anatomic layers and the skin was well approximated under minimal tension.  The surgical site was then dressed with xeroform 4x4 guaze, kerlix and ace wrap. The patient tolerated both the procedure and anesthesia well with vital signs stable throughout. The patient was transferred in good condition and all vital signs stable  from the OR to recovery under the discretion of anesthesia.  Condition: Vital signs stable, neurovascular status unchanged  from preoperative   Surgical plan:  Expect clean margin.  Poor bleeding noted intra operatively. Will need close monitoring outpatient, should healing be an issue will reengage with vascular surgery. Short course PO abx. Stable for dc home tmrw. Follow up 1 week in office leave dressing  C/D/I.   The patient will be WBAT in a post op shoe to the operative limb until further instructed. The dressing is to remain clean, dry, and intact. Will continue to follow unless noted elsewhere.   Marsa Honour, DPM [facility]

## 2023-08-08 NOTE — Progress Notes (Signed)
 Progress Note   Patient: Carmen Armstrong FMW:[id number] DOB: 01/10/1966 DOA: 08/07/2023  DOS: the patient was seen and examined on 08/08/2023   Brief hospital course:  58 y.o. female with medical history significant of amputation of the right foot great toe and second toe secondary to osteomyelitis in April 2025 s/p great toe amputation and partial second amputation, insulin -dependent DM type II, normocytic anemia, peripheral neuropathy, essential hypertension, paroxysmal atrial fibrillation, chronic diastolic heart failure, CAD, PAD, and hyperlipidemia, seen at podiatry clinic evaluated by Dr. Malvin concern for right second toe ulceration and drainage with development of osteomyelitis of the second toe.  Per chart review of the podiatry outpatient note recommended IV antibiotic, MRI of the right toes to evaluate for osteomyelitis, keep patient n.p.o. with possible debridement or amputation tomorrow based on the MRI result.  Assessment and Plan:  Diabetic foot wound with concern for osteomyelitis - Referred from podiatry clinic secondary to ongoing foot infection/osteomyelitis at the site of previous amputation of right great toe and second toe.  Continues on empiric broad-spectrum antibiotics (cefepime, Flagyl , vancomycin ).  Podiatry following closely.  Possible revision/amputation later today.  Paroxysmal atrial fibrillation - Continue metoprolol .  Holding Eliquis  secondary to impending procedure.  Insulin -dependent diabetes mellitus - Having some hypoglycemia this morning, likely exacerbated by infection and n.p.o. status.  Will place on D5 half-normal for now.  A1c 6.12 months ago suggesting excellent control prior.  Normocytic anemia - Hemoglobin stable, no active bleeding.  Recheck CBC in AM.  Chronic HFpEF - Does not appear to be in acute exacerbation.  Metoprolol  on board for now.  Holding spironolactone  and ARB secondary to soft BP.  Hyperthyroidism - Methimazole  on  board.  Generalized anxiety disorder - Continue Prozac , bupropion   Chronic pain syndrome with chronic opioid use - Patient on Belbuca 300 mcg twice daily.  Tramadol as needed.    Subjective: Patient resting comfortably this morning.  Denies any fever, chills, chest pain, nausea, vomiting, abdominal pain.  Glucose this morning 50s to 60s despite multiple D50 pushes.  Patient largely asymptomatic.  Physical Exam:  Vitals:   08/08/23 0600 08/08/23 0615 08/08/23 0634 08/08/23 0645  BP:   (!) 158/88 (!) 161/68  Pulse:   72 71  Resp: 14 15 18 14   Temp:   98.1 F (36.7 C)   TempSrc:      SpO2:   98% 99%  Weight:      Height:        GENERAL:  Alert, pleasant, no acute distress  HEENT:  EOMI CARDIOVASCULAR:  RRR, no murmurs appreciated RESPIRATORY:  Clear to auscultation, no wheezing, rales, or rhonchi GASTROINTESTINAL:  Soft, nontender, nondistended EXTREMITIES: Right great toe amputation NEURO:  No new focal deficits appreciated SKIN: Gangrenous portion of right distal second toe PSYCH:  Appropriate mood and affect     Data Reviewed:  No new imaging to review  Previous records (including but not limited to H&P, progress notes, nursing notes, TOC management) were reviewed in assessment of this patient.  Labs: CBC: Recent Labs  Lab 08/07/23 1730 08/08/23 0441  WBC 7.2 6.5  NEUTROABS 3.2  --   HGB 10.8* 10.3*  HCT 35.5* 34.5*  MCV 82.4 83.1  PLT 180 166   Basic Metabolic Panel: Recent Labs  Lab 08/07/23 1730 08/08/23 0441  NA 140 138  K 4.1 3.8  CL 106 109  CO2 22 25  GLUCOSE 107* 101*  BUN 8 7  CREATININE 0.91 0.81  CALCIUM  9.1 8.5*   Liver  Function Tests: Recent Labs  Lab 08/07/23 1730  AST 15  ALT 10  ALKPHOS 69  BILITOT 0.4  PROT 7.0  ALBUMIN 3.3*   CBG: Recent Labs  Lab 08/08/23 0347 08/08/23 0512 08/08/23 0615 08/08/23 0821 08/08/23 0855  GLUCAP 94 80 77 69* 139*    Scheduled Meds:  atorvastatin   40 mg Oral QHS   buPROPion    300 mg Oral q morning   FLUoxetine   20 mg Oral Daily   fluticasone furoate-vilanterol  1 puff Inhalation Daily   insulin  aspart  0-5 Units Subcutaneous QHS   insulin  aspart  0-6 Units Subcutaneous TID WC   linaclotide   145 mcg Oral Daily   methimazole   5 mg Oral Once per day on Monday Tuesday Wednesday Thursday Friday Saturday   metoprolol  succinate  100 mg Oral Daily   pregabalin   100 mg Oral TID   sodium chloride  flush  3 mL Intravenous Q12H   Continuous Infusions:  sodium chloride      ceFEPime (MAXIPIME) IV Stopped (08/08/23 9383)   dextrose  5 % and 0.45 % NaCl 75 mL/hr at 08/08/23 1040   metronidazole  Stopped (08/08/23 1014)   vancomycin  Stopped (08/08/23 0256)   PRN Meds:.sodium chloride , acetaminophen  **OR** acetaminophen , dextrose , naLOXone  (NARCAN )  injection, ondansetron  **OR** ondansetron  (ZOFRAN ) IV, sodium chloride  flush, traMADol  Family Communication: None at bedside  Disposition: Status is: Inpatient Remains inpatient appropriate because: Osteomyelitis     Time spent: 35 minutes  Length of inpatient stay: 1 days  Author: Carliss LELON Canales, DO 08/08/2023 10:56 AM  For on call review [email or web address].

## 2023-08-08 NOTE — Anesthesia Preprocedure Evaluation (Addendum)
 Anesthesia Evaluation  Patient identified by MRN, date of birth, ID band Patient awake    Reviewed: Allergy & Precautions, NPO status , Patient's Chart, lab work & pertinent test results  History of Anesthesia Complications Negative for: history of anesthetic complications  Airway Mallampati: III  TM Distance: >3 FB Neck ROM: Full    Dental  (+) Edentulous Upper, Edentulous Lower, Dental Advisory Given   Pulmonary neg shortness of breath, neg sleep apnea, neg COPD, neg recent URI, Current Smoker and Patient abstained from smoking.   Pulmonary exam normal breath sounds clear to auscultation       Cardiovascular hypertension, Pt. on medications (-) angina + CAD, + Peripheral Vascular Disease and +CHF  (-) Past MI, (-) Cardiac Stents and (-) CABG + dysrhythmias Atrial Fibrillation  Rhythm:Regular Rate:Normal  HLD  Echocardiogram 08/28/2022: Normal LV systolic function with EF 65%. Mild concentric hypertrophy of the left ventricle. Normal global wall motion. Left ventricle cavity is normal in size. Doppler evidence of grade I (impaired) diastolic dysfunction, normal LAP. No significant valvular abnormality. IVC not seen.     Neuro/Psych neg Seizures PSYCHIATRIC DISORDERS Anxiety Depression    Spinal cord stimulator  Neuromuscular disease (peripheral neuropathy, lumbosacral radiculopathy)    GI/Hepatic Neg liver ROS,GERD  Medicated,,  Endo/Other  diabetes, Type 2Hypothyroidism    Renal/GU negative Renal ROS     Musculoskeletal  (+) Arthritis ,    Abdominal   Peds  Hematology  (+) Blood dyscrasia, anemia Lab Results      Component                Value               Date                      WBC                      6.5                 08/08/2023                HGB                      10.3 (L)            08/08/2023                HCT                      34.5 (L)            08/08/2023                MCV                       83.1                08/08/2023                PLT                      166                 08/08/2023              Anesthesia Other Findings Last Mounjaro : 08/05/2023  On Eliquis : last dose yesterday  Reproductive/Obstetrics  Anesthesia Physical Anesthesia Plan  ASA: 3  Anesthesia Plan: MAC   Post-op Pain Management:    Induction: Intravenous  PONV Risk Score and Plan: 1 and Ondansetron , Dexamethasone , Propofol  infusion, TIVA and Treatment may vary due to age or medical condition  Airway Management Planned: Natural Airway and Simple Face Mask  Additional Equipment:   Intra-op Plan:   Post-operative Plan:   Informed Consent: I have reviewed the patients History and Physical, chart, labs and discussed the procedure including the risks, benefits and alternatives for the proposed anesthesia with the patient or authorized representative who has indicated his/her understanding and acceptance.     Dental advisory given  Plan Discussed with: CRNA and Anesthesiologist  Anesthesia Plan Comments: (Discussed with patient risks of MAC including, but not limited to, minor pain or discomfort, hearing people in the room, and possible need for backup general anesthesia. Risks for general anesthesia also discussed including, but not limited to, sore throat, hoarse voice, chipped/damaged teeth, injury to vocal cords, nausea and vomiting, allergic reactions, lung infection, heart attack, stroke, and death. All questions answered. )        Anesthesia Quick Evaluation

## 2023-08-08 NOTE — ED Notes (Signed)
 Checked patient cbg it was 139 patient is resting with call bell in reach

## 2023-08-08 NOTE — Inpatient Diabetes Management (Signed)
 Inpatient Diabetes Program Recommendations  AACE/ADA: New Consensus Statement on Inpatient Glycemic Control (2015)  Target Ranges:  Prepandial:   less than 140 mg/dL      Peak postprandial:   less than 180 mg/dL (1-2 hours)      Critically ill patients:  140 - 180 mg/dL   Lab Results  Component Value Date   GLUCAP 139 (H) 08/08/2023   HGBA1C 6.7 (A) 07/31/2023    Review of Glycemic Control  Latest Reference Range & Units 08/08/23 02:48 08/08/23 03:47 08/08/23 05:12 08/08/23 06:15 08/08/23 08:21 08/08/23 08:55  Glucose-Capillary 70 - 99 mg/dL 67 (L) 94 80 77 69 (L) 139 (H)   Diabetes history: DM 2 Outpatient Diabetes medications:  Novolog  10-20 units tid- Not taking Tresiba  54 units daily Mounjaro  12.5 weekly Current orders for Inpatient glycemic control:  Novolog  0-6 units tid with meals and HS (0-5 units)  Inpatient Diabetes Program Recommendations:    Note patient was taking basal insulin  prior to admit which may have still been on board this AM.   Once CBG's>150 mg/dL, consider restarting a portion of home basal insulin  such as Semglee  20 units daily.   Thanks,  Randall Bullocks, RN, BC-ADM Inpatient Diabetes Coordinator Pager [phone]  (8a-5p)

## 2023-08-08 NOTE — Consult Note (Signed)
 PODIATRY CONSULTATION  NAME Carmen Armstrong MRN [id number] DOB 1965/08/15 DOA 08/07/2023   Reason for consult:  Chief Complaint  Patient presents with   Wound Infection    Attending/Consulting physician: CHARM Canales DO  History of present illness: 59 y.o. female with medical history significant of amputation of the right foot great toe and second toe secondary to osteomyelitis in April 2025 s/p great toe amputation and partial second amputation, insulin -dependent DM type II, normocytic anemia, peripheral neuropathy, essential hypertension, paroxysmal atrial fibrillation, chronic diastolic heart failure, CAD, PAD, and hyperlipidemia, seen at podiatry clinic evaluated by Dr. Malvin concern for right second toe ulceration and drainage with development of osteomyelitis of the second toe.  Per chart review of the podiatry outpatient note recommended IV antibiotic, MRI of the right toes to evaluate for osteomyelitis, keep patient n.p.o. with possible debridement or amputation tomorrow based on the MRI result.  Pt seen by myself yesterday and sent in for eval and treatment of osteomyelitis right second toe. Please see clinic note from yesterday. Discussed MRI findings with her re no evidence of OM in right hallux but definite OM right second toe and plan for amputation there. She is in agreement to proceed.   Past Medical History:  Diagnosis Date   Arrhythmia    Arthritis    Atrial fibrillation (HCC)    CAD (coronary artery disease)    Chronic lower back pain    Depression    Diabetes mellitus without complication (HCC)    Eczema    GAD (generalized anxiety disorder)    GERD (gastroesophageal reflux disease)    H/O vitamin D deficiency    Heart failure (HCC)    Hyperlipidemia    Hypertension    Hypothyroidism    Insomnia    Peripheral neuropathy    PVD (peripheral vascular disease) (HCC)        Latest Ref Rng & Units 08/08/2023    4:41 AM 08/07/2023    5:30 PM 07/12/2023     3:24 PM  CBC  WBC 4.0 - 10.5 K/uL 6.5  7.2    Hemoglobin 12.0 - 15.0 g/dL 89.6  89.1  88.0   Hematocrit 36.0 - 46.0 % 34.5  35.5  35.0   Platelets 150 - 400 K/uL 166  180         Latest Ref Rng & Units 08/08/2023    4:41 AM 08/07/2023    5:30 PM 07/12/2023    3:24 PM  BMP  Glucose 70 - 99 mg/dL 898  892    BUN 6 - 20 mg/dL 7  8    Creatinine 9.55 - 1.00 mg/dL 9.18  9.08    Sodium 864 - 145 mmol/L 138  140  141   Potassium 3.5 - 5.1 mmol/L 3.8  4.1  3.6   Chloride 98 - 111 mmol/L 109  106    CO2 22 - 32 mmol/L 25  22    Calcium  8.9 - 10.3 mg/dL 8.5  9.1        Physical Exam: Lower Extremity Exam  Constitutional Well developed. Well nourished.  Vascular Foot warm and well perfused. Capillary refill normal to all digits.   No calf pain with palpation  Neurologic Normal speech. Oriented to person, place, and time. Epicritic sensation diminished bilateral foot  Dermatologic Left second toe amputation fully healed without pain   Right hallux amputation site fully healed with some eschar and hyperkeratotic tissue and some darkening discoloration however not severe.  Right second  toe with increased erythema dark discoloration and significant edema.  There is some ulceration of the distal tuft with concern for drainage.       Orthopedic: Status post right hallux amputation IPJ level and left second toe amputation PIPJ level     ASSESSMENT/PLAN OF CARE 58 y.o. female with PMHx significant for  amputation of the right foot great toe and  left second toe secondary to osteomyelitis in April 2025 s/p great toe amputation and LEFT partial second toe  amputation, insulin -dependent DM type II, normocytic anemia, peripheral neuropathy, essential hypertension, paroxysmal atrial fibrillation, chronic diastolic heart failure, CAD, PAD, and hyperlipidemi  with new ulceration and underlying osteomyelitis of the RIGHT second toe ( no prior surgery at that site).   WBC 6.5 MRI R foot: Soft  tissue wound about the distal second digit with likely osteomyelitis to the tuft. Mild marrow edema to the middle phalanx which is probably reactive over infection.   Suggestion of a developing wound/ulceration overlying the amputated first digit. No definitive underlying osteomyelitis. Recommend close clinical follow-up and reimaging if warranted.  - OM R 2nd toe distal phalanx. NPO for OR this afternoon for Right 2nd toe amputation mid proximal phalanx level. No evidence of OM in R hallux proximal phalanx. She agrees to proceed. Prior revascularization per vascular surgery 4/7 optimized for amputation at that time. - Continue IV abx broad spectrum pending further culture data - Anticoagulation: ok to continue perioperatively/ resume post op  - Wound care: Leave surgical dressing C/D/I - WB status: WBAT in post op shoe following OR - Will continue to follow   Thank you for the consult.  Please contact me directly with any questions or concerns.           Marolyn JULIANNA Honour, DPM [facility] / [facility]    [address] [place], [place] [address]                Office [phone]  Fax [phone]

## 2023-08-08 NOTE — Hospital Course (Signed)
 58 y.o. female with medical history significant of amputation of the right foot great toe and second toe secondary to osteomyelitis in April 2025 s/p great toe amputation and partial second amputation, insulin -dependent DM type II, normocytic anemia, peripheral neuropathy, essential hypertension, paroxysmal atrial fibrillation, chronic diastolic heart failure, CAD, PAD, and hyperlipidemia, seen at podiatry clinic evaluated by Dr. Malvin concern for right second toe ulceration and drainage with development of osteomyelitis of the second toe.  Per chart review of the podiatry outpatient note recommended IV antibiotic, MRI of the right toes to evaluate for osteomyelitis, keep patient n.p.o. with possible debridement or amputation tomorrow based on the MRI result.   Assessment and Plan:   Diabetic foot wound with concern for osteomyelitis - Referred from podiatry clinic secondary to ongoing foot infection/osteomyelitis at the site of previous amputation of right great toe and second toe.  Continues on empiric broad-spectrum antibiotics (cefepime, Flagyl , vancomycin ).  Podiatry following closely.  Possible revision/amputation later today.   Paroxysmal atrial fibrillation - Continue metoprolol .  Holding Eliquis  secondary to impending procedure.   Insulin -dependent diabetes mellitus - Having some hypoglycemia this morning, likely exacerbated by infection and n.p.o. status.  Will place on D5 half-normal for now.  A1c 6.12 months ago suggesting excellent control prior.   Normocytic anemia - Hemoglobin stable, no active bleeding.  Recheck CBC in AM.   Chronic HFpEF - Does not appear to be in acute exacerbation.  Metoprolol  on board for now.  Holding spironolactone  and ARB secondary to soft BP.   Hyperthyroidism - Methimazole  on board.   Generalized anxiety disorder - Continue Prozac , bupropion    Chronic pain syndrome with chronic opioid use - Patient on Belbuca 300 mcg twice daily.  Tramadol as  needed.

## 2023-08-08 NOTE — Transfer of Care (Signed)
 Immediate Anesthesia Transfer of Care Note  Patient: Carmen Armstrong  Procedure(s) Performed: AMPUTATION, TOE (Right: Toe)  Patient Location: PACU  Anesthesia Type:MAC  Level of Consciousness: drowsy  Airway & Oxygen Therapy: Patient Spontanous Breathing  Post-op Assessment: Report given to RN and Post -op Vital signs reviewed and stable  Post vital signs: Reviewed and stable  Last Vitals:  Vitals Value Taken Time  BP 119/67 08/08/23 16:21  Temp    Pulse 59 08/08/23 16:23  Resp 14 08/08/23 16:23  SpO2 95 % 08/08/23 16:23  Vitals shown include unfiled device data.  Last Pain:  Vitals:   08/08/23 1458  TempSrc: Oral  PainSc: 4          Complications: No notable events documented.

## 2023-08-08 NOTE — Plan of Care (Signed)

## 2023-08-08 NOTE — ED Notes (Signed)
 This RN checked pt glucose. Glucose was low, hypoglycemia orders were followed. Pt was given orange juice and Dr.Sundil was made aware of situation. Pt sugar still low after 15 min check. D50 was given to pt. Report was then given to Dreac RN for him to take over pt care.

## 2023-08-08 NOTE — Progress Notes (Signed)
 Orthopedic Tech Progress Note Patient Details:  Carmen Armstrong [birth year] [id number]  PT has POST OP SHOE   Patient ID: Carmen Armstrong, female   DOB: 1965-03-04, 58 y.o.   MRN: [id number]  Carmen Armstrong Pac 08/08/2023, 4:37 PM

## 2023-08-09 ENCOUNTER — Encounter (HOSPITAL_COMMUNITY): Payer: Self-pay | Admitting: Podiatry

## 2023-08-09 DIAGNOSIS — L089 Local infection of the skin and subcutaneous tissue, unspecified: Secondary | ICD-10-CM | POA: Diagnosis not present

## 2023-08-09 DIAGNOSIS — Z899 Acquired absence of limb, unspecified: Secondary | ICD-10-CM | POA: Diagnosis not present

## 2023-08-09 DIAGNOSIS — E11628 Type 2 diabetes mellitus with other skin complications: Secondary | ICD-10-CM | POA: Diagnosis not present

## 2023-08-09 DIAGNOSIS — M869 Osteomyelitis, unspecified: Secondary | ICD-10-CM

## 2023-08-09 LAB — BASIC METABOLIC PANEL WITH GFR
Anion gap: 6 (ref 5–15)
BUN: 8 mg/dL (ref 6–20)
CO2: 27 mmol/L (ref 22–32)
Calcium: 8.7 mg/dL — ABNORMAL LOW (ref 8.9–10.3)
Chloride: 105 mmol/L (ref 98–111)
Creatinine, Ser: 0.85 mg/dL (ref 0.44–1.00)
GFR, Estimated: >60 mL/min (ref >60)
Glucose, Bld: 122 mg/dL — ABNORMAL HIGH (ref 70–99)
Potassium: 4 mmol/L (ref 3.5–5.1)
Sodium: 138 mmol/L (ref 135–145)

## 2023-08-09 LAB — CBC
HCT: 35.7 % — ABNORMAL LOW (ref 36.0–46.0)
Hemoglobin: 10.8 g/dL — ABNORMAL LOW (ref 12.0–15.0)
MCH: 24.7 pg — ABNORMAL LOW (ref 26.0–34.0)
MCHC: 30.3 g/dL (ref 30.0–36.0)
MCV: 81.5 fL (ref 80.0–100.0)
Platelets: 164 10*3/uL (ref 150–400)
RBC: 4.38 MIL/uL (ref 3.87–5.11)
RDW: 17.2 % — ABNORMAL HIGH (ref 11.5–15.5)
WBC: 6.5 10*3/uL (ref 4.0–10.5)
nRBC: 0 % (ref 0.0–0.2)

## 2023-08-09 LAB — GLUCOSE, CAPILLARY: Glucose-Capillary: 115 mg/dL — ABNORMAL HIGH (ref 70–99)

## 2023-08-09 LAB — MAGNESIUM: Magnesium: 1.5 mg/dL — ABNORMAL LOW (ref 1.7–2.4)

## 2023-08-09 MED ORDER — DOXYCYCLINE HYCLATE 100 MG PO TABS: 100.0000 mg | ORAL_TABLET | Freq: Two times a day (BID) | ORAL | 0 refills | Status: DC

## 2023-08-09 NOTE — Plan of Care (Signed)

## 2023-08-09 NOTE — Discharge Summary (Signed)
 Physician Discharge Summary   Patient: Carmen Armstrong MRN: [id number] DOB: 10/09/1965  Admit date:     08/07/2023  Discharge date: 08/09/23  Discharge Physician: Carliss LELON Canales   PCP: Waylan Almarie SAUNDERS, MD   Recommendations at discharge:    Pt to be discharged home.   If you experience worsening fever, chills, chest pain, shortness of breath, or other concerning symptoms, please call your PCP or go to the emergency department immediately.  Discharge Diagnoses: Principal Problem:   Diabetic foot infection (HCC) Active Problems:   History of amputation   Hyperlipidemia   Paroxysmal atrial fibrillation (HCC)   Insulin  dependent type 2 diabetes mellitus (HCC)   Normocytic anemia   Peripheral neuropathy   Chronic diastolic CHF (congestive heart failure) (HCC)   History of CAD (coronary artery disease)   Peripheral artery disease (HCC)   Generalized anxiety disorder   Hyperthyroidism   Reactive airway disease   Osteomyelitis of second toe of right foot (HCC)   Osteomyelitis of right foot (HCC)  Resolved Problems:   * No resolved hospital problems. *   Hospital Course:  58 y.o. female with medical history significant of amputation of the right foot great toe and second toe secondary to osteomyelitis in April 2025 s/p great toe amputation and partial second amputation, insulin -dependent DM type II, normocytic anemia, peripheral neuropathy, essential hypertension, paroxysmal atrial fibrillation, chronic diastolic heart failure, CAD, PAD, and hyperlipidemia, seen at podiatry clinic evaluated by Dr. Malvin concern for right second toe ulceration and drainage with development of osteomyelitis of the second toe.  Per chart review of the podiatry outpatient note recommended IV antibiotic, MRI of the right toes to evaluate for osteomyelitis, keep patient n.p.o. with possible debridement or amputation tomorrow based on the MRI result.   Assessment and Plan:   Diabetic foot wound  with concern for osteomyelitis - Referred from podiatry clinic secondary to ongoing foot infection/osteomyelitis at the site of previous amputation of right great toe and second toe.  Received empiric broad-spectrum antibiotics (cefepime, Flagyl , vancomycin ).  Podiatry following closely.  S/P Amputation 2nd toe mid proximal phalanx level, right foot.  Recommending 5 more days p.o. Doxy upon discharge.  Leave dressing intact, dry, boot acquired.  Follow-up with podiatry later this week, early next week.   Paroxysmal atrial fibrillation - Continue metoprolol .  Okay to resume Eliquis .   Insulin -dependent diabetes mellitus - Having some hypoglycemia, likely exacerbated by infection and n.p.o. status.  A1c 6.1 2 months ago suggesting excellent control prior.  Resume home medication regiment.   Normocytic anemia - Hemoglobin stable, no active bleeding.    Chronic HFpEF - Does not appear to be in acute exacerbation.  Resume home medication regimen   Hyperthyroidism - Methimazole  on board.   Generalized anxiety disorder - Continue Prozac , bupropion    Chronic pain syndrome with chronic opioid use - Patient on Belbuca 300 mcg twice daily.  Tramadol as needed.   Consultants: Podiatry Procedures performed: Amputation 2nd toe mid proximal phalanx level, right foot   Disposition: Home Diet recommendation:  Discharge Diet Orders (From admission, onward)     Start     Ordered   08/09/23 0000  Diet - low sodium heart healthy        08/09/23 0913           Cardiac and Carb modified diet  DISCHARGE MEDICATION: Allergies as of 08/09/2023       Reactions   Other Shortness Of Breath   Hazelnuts   Corylus Other (  See Comments)   Unknown    Pear Other (See Comments)   welts   Penicillin G Other (See Comments)   Whelps - childhood allergy   Trulicity [dulaglutide] Other (See Comments)   Large itchy bumps        Medication List     STOP taking these medications    FLUoxetine  20  MG capsule Commonly known as: PROZAC    NovoLOG  FlexPen 100 UNIT/ML FlexPen Generic drug: insulin  aspart   spironolactone  25 MG tablet Commonly known as: ALDACTONE        TAKE these medications    Airsupra 90-80 MCG/ACT Aero Generic drug: Albuterol -Budesonide  Inhale 2 puffs into the lungs in the morning and at bedtime.   amLODipine  10 MG tablet Commonly known as: NORVASC  Take 1 tablet (10 mg total) by mouth daily.   atorvastatin  40 MG tablet Commonly known as: LIPITOR Take 40 mg by mouth at bedtime.   Belbuca 300 MCG Film Generic drug: Buprenorphine HCl Take by mouth 2 (two) times daily. What changed: Another medication with the same name was removed. Continue taking this medication, and follow the directions you see here.   buPROPion  300 MG 24 hr tablet Commonly known as: WELLBUTRIN  XL Take 300 mg by mouth every morning.   candesartan 4 MG tablet Commonly known as: ATACAND Take 4 mg by mouth daily.   colchicine 0.6 MG tablet Take 0.6-1.2 mg by mouth See admin instructions. Take 1.2 mg for the first initial dose then 0.6 mg daily.   doxycycline  100 MG tablet Commonly known as: VIBRA -TABS Take 1 tablet (100 mg total) by mouth 2 (two) times daily.   Eliquis  5 MG Tabs tablet Generic drug: apixaban  Take 1 tablet (5 mg total) by mouth 2 (two) times daily.   fluticasone-salmeterol 250-50 MCG/ACT Aepb Commonly known as: ADVAIR Inhale 1 puff into the lungs 2 (two) times daily.   Linzess  145 MCG Caps capsule Generic drug: linaclotide  Take 145 mcg by mouth daily.   methimazole  5 MG tablet Commonly known as: TAPAZOLE  Take 1 tablet (5 mg total) by mouth as directed. 1 tablet Monday through Saturday, none on Sundays   metoprolol succinate 100 MG 24 hr tablet Commonly known as: TOPROL-XL Take 100 mg by mouth daily.   naloxone 4 MG/0.1ML Liqd nasal spray kit Commonly known as: NARCAN Place 1 spray into the nose as needed (OD).   omeprazole 20 MG capsule Commonly  known as: PRILOSEC Take 20 mg by mouth daily.   pregabalin 100 MG capsule Commonly known as: Lyrica Take 1 capsule (100 mg total) by mouth 3 (three) times daily.   tirzepatide 12.5 MG/0.5ML Pen Commonly known as: MOUNJARO Inject 12.5 mg into the skin once a week.   traMADol 50 MG tablet Commonly known as: ULTRAM Take 50 mg by mouth every 8 (eight) hours as needed.   Tresiba FlexTouch 100 UNIT/ML FlexTouch Pen Generic drug: insulin degludec Inject 64 Units into the skin daily. What changed: how much to take   Xigduo XR 11-998 MG Tb24 Generic drug: Dapagliflozin Pro-metFORMIN ER Take 2 tablets by mouth daily.               Discharge Care Instructions  (From admission, onward)           Start     Ordered   08/09/23 0000  Leave dressing on - Keep it clean, dry, and intact until clinic visit        06 /26/25 0913  Discharge Exam: Filed Weights   08/07/23 1727  Weight: 92.5 kg    GENERAL:  Alert, pleasant, no acute distress  HEENT:  EOMI CARDIOVASCULAR:  RRR, no murmurs appreciated RESPIRATORY:  Clear to auscultation, no wheezing, rales, or rhonchi GASTROINTESTINAL:  Soft, nontender, nondistended EXTREMITIES: Right great toe and second toe amputation NEURO:  No new focal deficits appreciated SKIN: Fresh bandage over right foot PSYCH:  Appropriate mood and affect    Condition at discharge: improving  The results of significant diagnostics from this hospitalization (including imaging, microbiology, ancillary and laboratory) are listed below for reference.   Imaging Studies: DG Foot 2 Views Right Result Date: 08/08/2023 CLINICAL DATA:  Postoperative state. EXAM: RIGHT FOOT - 2 VIEW COMPARISON:  Radiograph dated 08/07/2023. FINDINGS: Status post amputation of the proximal phalanx of the second toe as well as amputation of the distal phalanx of the great toe. No acute fracture or dislocation. The bones are osteopenic. Postsurgical changes of  the soft tissues of the forefoot. No radiopaque foreign object or subcu gas. IMPRESSION: Status post amputation of the proximal phalanx of the second toe as well as amputation of the distal phalanx of the great toe. Electronically Signed   By: Vanetta Chou M.D.   On: 08/08/2023 17:30   MRI Right foot without contrast Result Date: 08/07/2023 EXAM DESCRIPTION: MR FOOT RIGHT WO CONTRAST CLINICAL HISTORY: Soft tissue infection suspected, foot, xray done COMPARISON: None Available. TECHNIQUE: MRI of the foot is performed according to our usual protocol with multiplanar multi sequence imaging. FINDINGS: No fracture. There is a soft tissue wound/ulceration about the distal second digit. Areas of skin blistering on either side of the DIP joint. There is moderate to severe marrow edema and likely cortical erosions of the distal tuft of the second digit. Mild marrow edema to the middle phalanx which is probably reactive. The marrow signal is otherwise unremarkable. Amputation of the distal first digit. There is suggestion of a developing wound about the amputated stump. No drainable collection or definitive underlying osteomyelitis. Moderate muscle atrophy. Mild dorsal subcutaneous edema. The tendons are unremarkable. IMPRESSION: Soft tissue wound about the distal second digit with likely osteomyelitis to the tuft. Mild marrow edema to the middle phalanx which is probably reactive over infection. Suggestion of a developing wound/ulceration overlying the amputated first digit. No definitive underlying osteomyelitis. Recommend close clinical follow-up and reimaging if warranted. Electronically signed by: Reyes Frees MD 08/07/2023 11:21 PM EDT RP Workstation: [id number]   DG Foot Complete Right Result Date: 08/07/2023 Please see detailed radiograph report in office note.  DG Foot Complete Left Result Date: 08/07/2023 Please see detailed radiograph report in office note.  CT Head Wo Contrast Result Date:  07/12/2023 CLINICAL DATA:  Provided history: Facial trauma, blunt. Head trauma, focal neuro findings. Neck trauma, dangerous injury mechanism. Bruising/abrasion to bridge of nose. EXAM: CT HEAD WITHOUT CONTRAST CT MAXILLOFACIAL WITHOUT CONTRAST CT CERVICAL SPINE WITHOUT CONTRAST TECHNIQUE: Multidetector CT imaging of the head, cervical spine, and maxillofacial structures were performed using the standard protocol without intravenous contrast. Multiplanar CT image reconstructions of the cervical spine and maxillofacial structures were also generated. RADIATION DOSE REDUCTION: This exam was performed according to the departmental dose-optimization program which includes automated exposure control, adjustment of the mA and/or kV according to patient size and/or use of iterative reconstruction technique. COMPARISON:  None. FINDINGS: CT HEAD FINDINGS Brain: Mild generalized cerebral atrophy. There is no acute intracranial hemorrhage. No demarcated cortical infarct. No extra-axial fluid collection. No evidence of an  intracranial mass. No midline shift. Vascular: No hyperdense vessel.  Atherosclerotic calcifications. Skull: No calvarial fracture or aggressive osseous lesion. CT MAXILLOFACIAL FINDINGS Osseous: No acute maxillofacial fracture is identified. The patient is edentulous. Orbits: Bilateral proptosis. Globes/orbits otherwise unremarkable. Sinuses: No significant paranasal sinus disease. Soft tissues: No maxillofacial hematoma appreciable by CT. CT CERVICAL SPINE FINDINGS Alignment: Dextrocurvature of the cervical spine. Slight grade 1 retrolisthesis at C4-C5 and C5-C6. Slight grade 1 anterolisthesis at C7-T1. Skull base and vertebrae: The basion-dental and atlanto-dental intervals are maintained.No evidence of acute fracture to the cervical spine. Soft tissues and spinal canal: No prevertebral fluid or swelling. No visible canal hematoma. Disc levels: Cervical spondylosis with multilevel disc space narrowing, disc  bulges/central disc protrusions, endplate spurring and uncovertebral hypertrophy. Disc space narrowing is greatest at C3-C4 (moderate to advanced), C4-C5 (advanced), C5-C6 (moderate to advanced) and C6-C7 (moderate to advanced). Multilevel spinal canal stenosis. Most notably at C4-C5, a partially calcified central disc protrusion contributes to at least moderate spinal canal stenosis. Multilevel bony neural foraminal narrowing. Multilevel ventral osteophytes. Degenerative changes also present at the C1-C2 articulation. Upper chest: No visible pneumothorax. 5 mm left upper lobe pulmonary nodule (series 5, image 87). IMPRESSION: CT head: 1.  No acute intracranial finding. 2. Mild generalized cerebral atrophy. CT maxillofacial: 1. No evidence of an acute maxillofacial fracture. 2. Bilateral proptosis. CT cervical spine: 1. No evidence of an acute cervical spine fracture. 2. Mild grade 1 spondylolisthesis at C4-C5, C5-C6 and C7-T1. 3. Dextrocurvature of the cervical spine. 4. Cervical spondylosis as described within the body of the report. Multilevel spinal canal stenosis. Most notably at C4-C5, a partially calcified central disc protrusion contributes to at least moderate spinal canal stenosis. Multilevel bony neural foraminal narrowing. 5. 5 mm left upper lobe pulmonary nodule. A follow-up chest CT in 12 months is considered optional for high-risk patients. No follow-up imaging is required if patient is low-risk. This recommendation follows the consensus statement: Guidelines for Management of Incidental Pulmonary Nodules Detected on CT Images: From the Fleischner Society 2017; Radiology 2017; 284:228-243. Electronically Signed   By: Rockey Childs D.O.   On: 07/12/2023 16:36   CT Cervical Spine Wo Contrast Result Date: 07/12/2023 CLINICAL DATA:  Provided history: Facial trauma, blunt. Head trauma, focal neuro findings. Neck trauma, dangerous injury mechanism. Bruising/abrasion to bridge of nose. EXAM: CT HEAD  WITHOUT CONTRAST CT MAXILLOFACIAL WITHOUT CONTRAST CT CERVICAL SPINE WITHOUT CONTRAST TECHNIQUE: Multidetector CT imaging of the head, cervical spine, and maxillofacial structures were performed using the standard protocol without intravenous contrast. Multiplanar CT image reconstructions of the cervical spine and maxillofacial structures were also generated. RADIATION DOSE REDUCTION: This exam was performed according to the departmental dose-optimization program which includes automated exposure control, adjustment of the mA and/or kV according to patient size and/or use of iterative reconstruction technique. COMPARISON:  None. FINDINGS: CT HEAD FINDINGS Brain: Mild generalized cerebral atrophy. There is no acute intracranial hemorrhage. No demarcated cortical infarct. No extra-axial fluid collection. No evidence of an intracranial mass. No midline shift. Vascular: No hyperdense vessel.  Atherosclerotic calcifications. Skull: No calvarial fracture or aggressive osseous lesion. CT MAXILLOFACIAL FINDINGS Osseous: No acute maxillofacial fracture is identified. The patient is edentulous. Orbits: Bilateral proptosis. Globes/orbits otherwise unremarkable. Sinuses: No significant paranasal sinus disease. Soft tissues: No maxillofacial hematoma appreciable by CT. CT CERVICAL SPINE FINDINGS Alignment: Dextrocurvature of the cervical spine. Slight grade 1 retrolisthesis at C4-C5 and C5-C6. Slight grade 1 anterolisthesis at C7-T1. Skull base and vertebrae: The basion-dental and atlanto-dental  intervals are maintained.No evidence of acute fracture to the cervical spine. Soft tissues and spinal canal: No prevertebral fluid or swelling. No visible canal hematoma. Disc levels: Cervical spondylosis with multilevel disc space narrowing, disc bulges/central disc protrusions, endplate spurring and uncovertebral hypertrophy. Disc space narrowing is greatest at C3-C4 (moderate to advanced), C4-C5 (advanced), C5-C6 (moderate to  advanced) and C6-C7 (moderate to advanced). Multilevel spinal canal stenosis. Most notably at C4-C5, a partially calcified central disc protrusion contributes to at least moderate spinal canal stenosis. Multilevel bony neural foraminal narrowing. Multilevel ventral osteophytes. Degenerative changes also present at the C1-C2 articulation. Upper chest: No visible pneumothorax. 5 mm left upper lobe pulmonary nodule (series 5, image 87). IMPRESSION: CT head: 1.  No acute intracranial finding. 2. Mild generalized cerebral atrophy. CT maxillofacial: 1. No evidence of an acute maxillofacial fracture. 2. Bilateral proptosis. CT cervical spine: 1. No evidence of an acute cervical spine fracture. 2. Mild grade 1 spondylolisthesis at C4-C5, C5-C6 and C7-T1. 3. Dextrocurvature of the cervical spine. 4. Cervical spondylosis as described within the body of the report. Multilevel spinal canal stenosis. Most notably at C4-C5, a partially calcified central disc protrusion contributes to at least moderate spinal canal stenosis. Multilevel bony neural foraminal narrowing. 5. 5 mm left upper lobe pulmonary nodule. A follow-up chest CT in 12 months is considered optional for high-risk patients. No follow-up imaging is required if patient is low-risk. This recommendation follows the consensus statement: Guidelines for Management of Incidental Pulmonary Nodules Detected on CT Images: From the Fleischner Society 2017; Radiology 2017; 284:228-243. Electronically Signed   By: Rockey Childs D.O.   On: 07/12/2023 16:36   CT Maxillofacial Wo Contrast Result Date: 07/12/2023 CLINICAL DATA:  Provided history: Facial trauma, blunt. Head trauma, focal neuro findings. Neck trauma, dangerous injury mechanism. Bruising/abrasion to bridge of nose. EXAM: CT HEAD WITHOUT CONTRAST CT MAXILLOFACIAL WITHOUT CONTRAST CT CERVICAL SPINE WITHOUT CONTRAST TECHNIQUE: Multidetector CT imaging of the head, cervical spine, and maxillofacial structures were  performed using the standard protocol without intravenous contrast. Multiplanar CT image reconstructions of the cervical spine and maxillofacial structures were also generated. RADIATION DOSE REDUCTION: This exam was performed according to the departmental dose-optimization program which includes automated exposure control, adjustment of the mA and/or kV according to patient size and/or use of iterative reconstruction technique. COMPARISON:  None. FINDINGS: CT HEAD FINDINGS Brain: Mild generalized cerebral atrophy. There is no acute intracranial hemorrhage. No demarcated cortical infarct. No extra-axial fluid collection. No evidence of an intracranial mass. No midline shift. Vascular: No hyperdense vessel.  Atherosclerotic calcifications. Skull: No calvarial fracture or aggressive osseous lesion. CT MAXILLOFACIAL FINDINGS Osseous: No acute maxillofacial fracture is identified. The patient is edentulous. Orbits: Bilateral proptosis. Globes/orbits otherwise unremarkable. Sinuses: No significant paranasal sinus disease. Soft tissues: No maxillofacial hematoma appreciable by CT. CT CERVICAL SPINE FINDINGS Alignment: Dextrocurvature of the cervical spine. Slight grade 1 retrolisthesis at C4-C5 and C5-C6. Slight grade 1 anterolisthesis at C7-T1. Skull base and vertebrae: The basion-dental and atlanto-dental intervals are maintained.No evidence of acute fracture to the cervical spine. Soft tissues and spinal canal: No prevertebral fluid or swelling. No visible canal hematoma. Disc levels: Cervical spondylosis with multilevel disc space narrowing, disc bulges/central disc protrusions, endplate spurring and uncovertebral hypertrophy. Disc space narrowing is greatest at C3-C4 (moderate to advanced), C4-C5 (advanced), C5-C6 (moderate to advanced) and C6-C7 (moderate to advanced). Multilevel spinal canal stenosis. Most notably at C4-C5, a partially calcified central disc protrusion contributes to at least moderate spinal canal  stenosis.  Multilevel bony neural foraminal narrowing. Multilevel ventral osteophytes. Degenerative changes also present at the C1-C2 articulation. Upper chest: No visible pneumothorax. 5 mm left upper lobe pulmonary nodule (series 5, image 87). IMPRESSION: CT head: 1.  No acute intracranial finding. 2. Mild generalized cerebral atrophy. CT maxillofacial: 1. No evidence of an acute maxillofacial fracture. 2. Bilateral proptosis. CT cervical spine: 1. No evidence of an acute cervical spine fracture. 2. Mild grade 1 spondylolisthesis at C4-C5, C5-C6 and C7-T1. 3. Dextrocurvature of the cervical spine. 4. Cervical spondylosis as described within the body of the report. Multilevel spinal canal stenosis. Most notably at C4-C5, a partially calcified central disc protrusion contributes to at least moderate spinal canal stenosis. Multilevel bony neural foraminal narrowing. 5. 5 mm left upper lobe pulmonary nodule. A follow-up chest CT in 12 months is considered optional for high-risk patients. No follow-up imaging is required if patient is low-risk. This recommendation follows the consensus statement: Guidelines for Management of Incidental Pulmonary Nodules Detected on CT Images: From the Fleischner Society 2017; Radiology 2017; 284:228-243. Electronically Signed   By: Rockey Childs D.O.   On: 07/12/2023 16:36   DG Hip Unilat W or Wo Pelvis 2-3 Views Right Result Date: 07/12/2023 CLINICAL DATA:  Fall and right hip pain. EXAM: DG HIP (WITH OR WITHOUT PELVIS) 2-3V RIGHT COMPARISON:  None Available. FINDINGS: The total right hip arthroplasty. The arthroplasty components appear intact and in anatomic alignment. There is no acute fracture or dislocation. The bones are osteopenic. Moderate arthritic changes of the left hip partially visualized lumbar fusion. The soft tissues are unremarkable. IMPRESSION: 1. No acute fracture or dislocation. 2. Total right hip arthroplasty. Electronically Signed   By: Vanetta Chou M.D.   On:  07/12/2023 16:24   DG Ankle Left Port Result Date: 07/12/2023 CLINICAL DATA:  Fall and left ankle pain. EXAM: PORTABLE LEFT ANKLE - 2 VIEW COMPARISON:  Left knee radiograph dated 05/28/2023. FINDINGS: No acute fracture or dislocation. The bones are osteopenic. The ankle mortise is intact. The soft tissues are unremarkable. IMPRESSION: 1. No acute fracture or dislocation. 2. Osteopenia. Electronically Signed   By: Vanetta Chou M.D.   On: 07/12/2023 16:20    Microbiology: Results for orders placed or performed during the hospital encounter of 08/07/23  Blood Cultures x 2 sites     Status: None (Preliminary result)   Collection Time: 08/07/23  7:43 PM   Specimen: BLOOD RIGHT HAND  Result Value Ref Range Status   Specimen Description BLOOD RIGHT HAND  Final   Special Requests   Final    BOTTLES DRAWN AEROBIC AND ANAEROBIC Blood Culture results may not be optimal due to an inadequate volume of blood received in culture bottles   Culture   Final    NO GROWTH 2 DAYS Performed at [facility], [address], [place], [place] [address]    Report Status PENDING  Incomplete  Aerobic/Anaerobic Culture w Gram Stain (surgical/deep wound)     Status: None (Preliminary result)   Collection Time: 08/08/23  4:12 PM   Specimen: PATH Benign ortho; Tissue  Result Value Ref Range Status   Specimen Description BONE  Final   Special Requests second right toe  Final   Gram Stain   Final    RARE WBC PRESENT, PREDOMINANTLY PMN FEW GRAM POSITIVE COCCI    Culture   Final    CULTURE REINCUBATED FOR BETTER GROWTH Performed at [facility], [address] [place], [place] [address]  Report Status PENDING  Incomplete    Labs: CBC: Recent Labs  Lab 08/07/23 1730 08/08/23 0441 08/09/23 0234  WBC 7.2 6.5 6.5  NEUTROABS 3.2  --   --   HGB 10.8* 10.3* 10.8*  HCT 35.5* 34.5* 35.7*  MCV 82.4 83.1 81.5  PLT 180 166 164   Basic Metabolic Panel: Recent Labs  Lab 08/07/23 1730  08/08/23 0441 08/09/23 0234  NA 140 138 138  K 4.1 3.8 4.0  CL 106 109 105  CO2 22 25 27   GLUCOSE 107* 101* 122*  BUN 8 7 8   CREATININE 0.91 0.81 0.85  CALCIUM  9.1 8.5* 8.7*  MG  --   --  1.5*   Liver Function Tests: Recent Labs  Lab 08/07/23 1730  AST 15  ALT 10  ALKPHOS 69  BILITOT 0.4  PROT 7.0  ALBUMIN 3.3*   CBG: Recent Labs  Lab 08/08/23 1717 08/08/23 1721 08/08/23 1756 08/08/23 2011 08/09/23 0752  GLUCAP 55* 56* 135* 158* 115*    Discharge time spent: 25 minutes.  Length of inpatient stay: 2 days  Signed: Carliss LELON Canales, DO [facility] 08/09/2023

## 2023-08-09 NOTE — Progress Notes (Signed)
  Subjective:  Patient ID: Carmen Armstrong, female    DOB: Dec 11, 1965,  MRN: [id number]  Chief Complaint  Patient presents with   Wound Infection    DOS: 08/08/23 Procedure: 1. Amputation 2nd toe mid proximal phalanx level, right foot   58 y.o. female seen for post op check. Patient reports her pain is well controlled. We dicussed surgical findings and plan for follow up - she says she has appointment with dr. Joya for July 1  Review of Systems: Negative except as noted in the HPI. Denies N/V/F/Ch.   Objective:   Constitutional Well developed. Well nourished.  Vascular Foot warm and well perfused. Capillary refill normal to all digits.   No calf pain with palpation  Neurologic Normal speech. Oriented to person, place, and time. Epicritic sensation diminished to toes right  Dermatologic Dressing C/D/I to right foot  Orthopedic: S/p right 2nd toe amputation   Radiographs: Status post amputation of the proximal phalanx of the second toe   Pathology: pendign  Micro: Few GPC  Assessment:   1. Osteomyelitis of right foot, unspecified type (HCC)   S/p Right 2nd toe amputation mid proximal phalanx level.   Plan:  Patient was evaluated and treated and all questions answered.  POD # 1 s/p R 2nd toe amputation -Progressing as expected post op, pain well controlled. Did have minimal bleeding intra operatively so will need close monitoring and a risk for non healing amp site similar to prior amputation.  -XR: expected post op changes -WB Status: WBAT in post op shoe -Sutures: Remain intact 2-3 weeks. -Medications/ABX: Recommend 5 days doxycycline  on discharge, abx can be adjusted as needed as outpatient -Dressing: Leave dressing c/d/I until follow up next week  - F/u Plan: Stable for dc home today, follow up with Dr. Joya as scheduled 7/1 she is aware and agrees. Will sign off please message with questions/concerns.          Marolyn JULIANNA Honour, DPM [facility] / [facility]

## 2023-08-09 NOTE — Anesthesia Postprocedure Evaluation (Signed)
 Anesthesia Post Note  Patient: Carmen Armstrong  Procedure(s) Performed: AMPUTATION, TOE (Right: Toe)     Patient location during evaluation: PACU Anesthesia Type: MAC and Regional Level of consciousness: awake and alert Pain management: pain level controlled Vital Signs Assessment: post-procedure vital signs reviewed and stable Respiratory status: spontaneous breathing, nonlabored ventilation, respiratory function stable and patient connected to nasal cannula oxygen Cardiovascular status: stable and blood pressure returned to baseline Postop Assessment: no apparent nausea or vomiting Anesthetic complications: no   No notable events documented.  Last Vitals:  Vitals:   08/09/23 0539 08/09/23 0754  BP: (!) 140/76 (!) 147/60  Pulse: 60 62  Resp: 20 18  Temp: 36.9 C   SpO2: 97% 97%    Last Pain:  Vitals:   08/09/23 0813  TempSrc:   PainSc: 0-No pain                 Cordella P Malcome Ambrocio

## 2023-08-10 LAB — SURGICAL PATHOLOGY

## 2023-08-12 LAB — CULTURE, BLOOD (ROUTINE X 2)
Culture: NO GROWTH
Culture: NO GROWTH
Special Requests: ADEQUATE

## 2023-08-13 LAB — AEROBIC/ANAEROBIC CULTURE W GRAM STAIN (SURGICAL/DEEP WOUND)

## 2023-08-14 ENCOUNTER — Encounter: Payer: Self-pay | Admitting: Podiatry

## 2023-08-14 ENCOUNTER — Ambulatory Visit (INDEPENDENT_AMBULATORY_CARE_PROVIDER_SITE_OTHER): Admitting: Podiatry

## 2023-08-14 DIAGNOSIS — Z9889 Other specified postprocedural states: Secondary | ICD-10-CM | POA: Diagnosis not present

## 2023-08-14 DIAGNOSIS — E1142 Type 2 diabetes mellitus with diabetic polyneuropathy: Secondary | ICD-10-CM

## 2023-08-14 DIAGNOSIS — Z899 Acquired absence of limb, unspecified: Secondary | ICD-10-CM

## 2023-08-14 NOTE — Progress Notes (Signed)
 Subjective:  Patient ID: Carmen Armstrong, female    DOB: [birth year],  MRN: [id number]  Chief Complaint  Patient presents with   Follow-up     Patient states that everything has been fine since last visit no drainage no pain    DOS: 08/08/23 Procedure: Right second to amputation   58 y.o. female returns for POV#1. Relates doing well and managing pain well. Looking a lot better.   Review of Systems: Negative except as noted in the HPI. Denies N/V/F/Ch.  Past Medical History:  Diagnosis Date   Arrhythmia    Arthritis    Atrial fibrillation (HCC)    CAD (coronary artery disease)    Chronic lower back pain    Depression    Diabetes mellitus without complication (HCC)    Eczema    GAD (generalized anxiety disorder)    GERD (gastroesophageal reflux disease)    H/O vitamin D deficiency    Heart failure (HCC)    Hyperlipidemia    Hypertension    Hypothyroidism    Insomnia    Peripheral neuropathy    PVD (peripheral vascular disease) (HCC)     Current Outpatient Medications:    AIRSUPRA 90-80 MCG/ACT AERO, Inhale 2 puffs into the lungs in the morning and at bedtime., Disp: , Rfl:    amLODipine  (NORVASC ) 10 MG tablet, Take 1 tablet (10 mg total) by mouth daily., Disp: 90 tablet, Rfl: 1   atorvastatin  (LIPITOR) 40 MG tablet, Take 40 mg by mouth at bedtime., Disp: , Rfl:    BELBUCA  300 MCG FILM, Take by mouth 2 (two) times daily., Disp: , Rfl:    buPROPion  (WELLBUTRIN  XL) 300 MG 24 hr tablet, Take 300 mg by mouth every morning., Disp: , Rfl:    candesartan (ATACAND) 4 MG tablet, Take 4 mg by mouth daily., Disp: , Rfl:    colchicine 0.6 MG tablet, Take 0.6-1.2 mg by mouth See admin instructions. Take 1.2 mg for the first initial dose then 0.6 mg daily., Disp: , Rfl:    doxycycline  (VIBRA -TABS) 100 MG tablet, Take 1 tablet (100 mg total) by mouth 2 (two) times daily., Disp: 10 tablet, Rfl: 0   ELIQUIS  5 MG TABS tablet, Take 1 tablet (5 mg total) by mouth 2 (two) times daily.,  Disp: 180 tablet, Rfl: 3   fluticasone -salmeterol (ADVAIR) 250-50 MCG/ACT AEPB, Inhale 1 puff into the lungs 2 (two) times daily., Disp: , Rfl:    LINZESS  145 MCG CAPS capsule, Take 145 mcg by mouth daily., Disp: , Rfl:    methimazole  (TAPAZOLE ) 5 MG tablet, Take 1 tablet (5 mg total) by mouth as directed. 1 tablet Monday through Saturday, none on Sundays, Disp: 78 tablet, Rfl: 3   metoprolol  succinate (TOPROL -XL) 100 MG 24 hr tablet, Take 100 mg by mouth daily., Disp: , Rfl:    naloxone  (NARCAN ) nasal spray 4 mg/0.1 mL, Place 1 spray into the nose as needed (OD)., Disp: , Rfl:    omeprazole (PRILOSEC) 20 MG capsule, Take 20 mg by mouth daily., Disp: , Rfl:    pregabalin  (LYRICA ) 100 MG capsule, Take 1 capsule (100 mg total) by mouth 3 (three) times daily., Disp: 90 capsule, Rfl: 2   tirzepatide  (MOUNJARO ) 12.5 MG/0.5ML Pen, Inject 12.5 mg into the skin once a week., Disp: 6 mL, Rfl: 3   traMADol  (ULTRAM ) 50 MG tablet, Take 50 mg by mouth every 8 (eight) hours as needed., Disp: , Rfl:    TRESIBA  FLEXTOUCH 100 UNIT/ML FlexTouch Pen, Inject 64 Units  into the skin daily. (Patient taking differently: Inject 54 Units into the skin daily.), Disp: , Rfl:    XIGDUO  XR 11-998 MG TB24, Take 2 tablets by mouth daily., Disp: , Rfl:   Social History   Tobacco Use  Smoking Status Every Day   Current packs/day: 0.50   Average packs/day: 0.5 packs/day for 30.0 years (15.0 ttl pk-yrs)   Types: Cigarettes  Smokeless Tobacco Never    Allergies  Allergen Reactions   Other Shortness Of Breath    Hazelnuts   Corylus Other (See Comments)    Unknown    Pear Other (See Comments)    welts   Penicillin G Other (See Comments)    Whelps - childhood allergy   Trulicity [Dulaglutide] Other (See Comments)    Large itchy bumps   Objective:  There were no vitals filed for this visit. There is no height or weight on file to calculate BMI. Constitutional Well developed. Well nourished.  Vascular Foot warm and  well perfused. Capillary refill normal to all digits.   Neurologic Normal speech. Oriented to person, place, and time. Epicritic sensation to light touch grossly present bilaterally.  Dermatologic Skin healing well without signs of infection. Skin edges well coapted without signs of infection.  Orthopedic: Tenderness to palpation noted about the surgical site.   Radiographs: interval resection of second toe proximal phalanx  Assessment:   1. Status post surgery   2. Diabetic polyneuropathy associated with type 2 diabetes mellitus (HCC)   3. History of amputation    Plan:  Patient was evaluated and treated and all questions answered.  S/p foot surgery right -Progressing as expected post-operatively. -WB Status: WBAT  in surgical shoe  -Sutures: intact. -Medications: n/a -Foot redressed.  Return in 2 weeks for suture removal.   Return in about 2 weeks (around 08/28/2023) for post op.

## 2023-08-28 ENCOUNTER — Encounter: Admitting: Podiatry

## 2023-08-29 ENCOUNTER — Ambulatory Visit (INDEPENDENT_AMBULATORY_CARE_PROVIDER_SITE_OTHER): Admitting: Podiatry

## 2023-08-29 ENCOUNTER — Encounter: Payer: Self-pay | Admitting: Podiatry

## 2023-08-29 DIAGNOSIS — Z899 Acquired absence of limb, unspecified: Secondary | ICD-10-CM

## 2023-08-29 DIAGNOSIS — E1142 Type 2 diabetes mellitus with diabetic polyneuropathy: Secondary | ICD-10-CM

## 2023-08-29 DIAGNOSIS — Z9889 Other specified postprocedural states: Secondary | ICD-10-CM

## 2023-08-29 NOTE — Progress Notes (Signed)
 Subjective:  Patient ID: Carmen Armstrong, female    DOB: [birth year],  MRN: [id number]  No chief complaint on file.   DOS: 08/08/23 Procedure: Right second to amputation   58 y.o. female returns for POV#2. Relates doing well and managing pain well.   Review of Systems: Negative except as noted in the HPI. Denies N/V/F/Ch.  Past Medical History:  Diagnosis Date   Arrhythmia    Arthritis    Atrial fibrillation (HCC)    CAD (coronary artery disease)    Chronic lower back pain    Depression    Diabetes mellitus without complication (HCC)    Eczema    GAD (generalized anxiety disorder)    GERD (gastroesophageal reflux disease)    H/O vitamin D deficiency    Heart failure (HCC)    Hyperlipidemia    Hypertension    Hypothyroidism    Insomnia    Peripheral neuropathy    PVD (peripheral vascular disease) (HCC)     Current Outpatient Medications:    AIRSUPRA 90-80 MCG/ACT AERO, Inhale 2 puffs into the lungs in the morning and at bedtime., Disp: , Rfl:    amLODipine  (NORVASC ) 10 MG tablet, Take 1 tablet (10 mg total) by mouth daily., Disp: 90 tablet, Rfl: 1   atorvastatin  (LIPITOR) 40 MG tablet, Take 40 mg by mouth at bedtime., Disp: , Rfl:    BELBUCA  300 MCG FILM, Take by mouth 2 (two) times daily., Disp: , Rfl:    buPROPion  (WELLBUTRIN  XL) 300 MG 24 hr tablet, Take 300 mg by mouth every morning., Disp: , Rfl:    candesartan (ATACAND) 4 MG tablet, Take 4 mg by mouth daily., Disp: , Rfl:    colchicine 0.6 MG tablet, Take 0.6-1.2 mg by mouth See admin instructions. Take 1.2 mg for the first initial dose then 0.6 mg daily., Disp: , Rfl:    doxycycline  (VIBRA -TABS) 100 MG tablet, Take 1 tablet (100 mg total) by mouth 2 (two) times daily., Disp: 10 tablet, Rfl: 0   ELIQUIS  5 MG TABS tablet, Take 1 tablet (5 mg total) by mouth 2 (two) times daily., Disp: 180 tablet, Rfl: 3   fluticasone -salmeterol (ADVAIR) 250-50 MCG/ACT AEPB, Inhale 1 puff into the lungs 2 (two) times daily., Disp:  , Rfl:    LINZESS  145 MCG CAPS capsule, Take 145 mcg by mouth daily., Disp: , Rfl:    methimazole  (TAPAZOLE ) 5 MG tablet, Take 1 tablet (5 mg total) by mouth as directed. 1 tablet Monday through Saturday, none on Sundays, Disp: 78 tablet, Rfl: 3   metoprolol  succinate (TOPROL -XL) 100 MG 24 hr tablet, Take 100 mg by mouth daily., Disp: , Rfl:    naloxone  (NARCAN ) nasal spray 4 mg/0.1 mL, Place 1 spray into the nose as needed (OD)., Disp: , Rfl:    omeprazole (PRILOSEC) 20 MG capsule, Take 20 mg by mouth daily., Disp: , Rfl:    pregabalin  (LYRICA ) 100 MG capsule, Take 1 capsule (100 mg total) by mouth 3 (three) times daily., Disp: 90 capsule, Rfl: 2   tirzepatide  (MOUNJARO ) 12.5 MG/0.5ML Pen, Inject 12.5 mg into the skin once a week., Disp: 6 mL, Rfl: 3   traMADol  (ULTRAM ) 50 MG tablet, Take 50 mg by mouth every 8 (eight) hours as needed., Disp: , Rfl:    TRESIBA  FLEXTOUCH 100 UNIT/ML FlexTouch Pen, Inject 64 Units into the skin daily. (Patient taking differently: Inject 54 Units into the skin daily.), Disp: , Rfl:    XIGDUO  XR 11-998 MG TB24, Take 2  tablets by mouth daily., Disp: , Rfl:   Social History   Tobacco Use  Smoking Status Every Day   Current packs/day: 0.50   Average packs/day: 0.5 packs/day for 30.0 years (15.0 ttl pk-yrs)   Types: Cigarettes  Smokeless Tobacco Never    Allergies  Allergen Reactions   Other Shortness Of Breath    Hazelnuts   Corylus Other (See Comments)    Unknown    Pear Other (See Comments)    welts   Penicillin G Other (See Comments)    Whelps - childhood allergy   Trulicity [Dulaglutide] Other (See Comments)    Large itchy bumps   Objective:  There were no vitals filed for this visit. There is no height or weight on file to calculate BMI. Constitutional Well developed. Well nourished.  Vascular Foot warm and well perfused. Capillary refill normal to all digits.   Neurologic Normal speech. Oriented to person, place, and time. Epicritic  sensation to light touch grossly present bilaterally.  Dermatologic Skin healing well without signs of infection. Skin edges well coapted without signs of infection.  Orthopedic: Tenderness to palpation noted about the surgical site.   Radiographs: interval resection of second toe proximal phalanx  Assessment:   1. Status post surgery   2. Diabetic polyneuropathy associated with type 2 diabetes mellitus (HCC)   3. History of amputation     Plan:  Patient was evaluated and treated and all questions answered.  S/p foot surgery right -Progressing as expected post-operatively. -WB Status: WBAT  in surgical shoe  -Sutures: removed today without incident.   -Medications: n/a -Foot redressed.  Return in 3 weeks for recheck  Return in about 3 weeks (around 09/19/2023) for post op.

## 2023-09-06 NOTE — Telephone Encounter (Signed)
 Error

## 2023-09-11 ENCOUNTER — Ambulatory Visit: Admitting: Internal Medicine

## 2023-09-19 ENCOUNTER — Ambulatory Visit (INDEPENDENT_AMBULATORY_CARE_PROVIDER_SITE_OTHER): Admitting: Podiatry

## 2023-09-19 ENCOUNTER — Encounter: Payer: Self-pay | Admitting: Podiatry

## 2023-09-19 DIAGNOSIS — M79674 Pain in right toe(s): Secondary | ICD-10-CM

## 2023-09-19 DIAGNOSIS — E1142 Type 2 diabetes mellitus with diabetic polyneuropathy: Secondary | ICD-10-CM

## 2023-09-19 DIAGNOSIS — Z9889 Other specified postprocedural states: Secondary | ICD-10-CM

## 2023-09-19 DIAGNOSIS — B351 Tinea unguium: Secondary | ICD-10-CM

## 2023-09-19 DIAGNOSIS — M79675 Pain in left toe(s): Secondary | ICD-10-CM

## 2023-09-19 NOTE — Progress Notes (Signed)
 Subjective:  Patient ID: Carmen Armstrong, female    DOB: 1965-08-13,  MRN: [id number]  Chief Complaint  Patient presents with   Routine Post Op    POV#8: R hallux and Left 2nd toe amp It looks like it's doing alright to me.    DOS: 08/08/23 Procedure: Right second to amputation   58 y.o. female returns for POV#3. Relates doing well and managing pain well.   Also concern of thickened elongated and painful nails that are difficult to trim. Requesting to have them trimmed today. Relates burning and tingling in their feet. Patient is diabetic and last A1c was  Lab Results  Component Value Date   HGBA1C 6.7 (A) 07/31/2023   .   PCP:  Waylan Almarie SAUNDERS, MD     Review of Systems: Negative except as noted in the HPI. Denies N/V/F/Ch.  Past Medical History:  Diagnosis Date   Arrhythmia    Arthritis    Atrial fibrillation (HCC)    CAD (coronary artery disease)    Chronic lower back pain    Depression    Diabetes mellitus without complication (HCC)    Eczema    GAD (generalized anxiety disorder)    GERD (gastroesophageal reflux disease)    H/O vitamin D deficiency    Heart failure (HCC)    Hyperlipidemia    Hypertension    Hypothyroidism    Insomnia    Peripheral neuropathy    PVD (peripheral vascular disease) (HCC)     Current Outpatient Medications:    AIRSUPRA 90-80 MCG/ACT AERO, Inhale 2 puffs into the lungs in the morning and at bedtime., Disp: , Rfl:    amLODipine  (NORVASC ) 10 MG tablet, Take 1 tablet (10 mg total) by mouth daily., Disp: 90 tablet, Rfl: 1   atorvastatin  (LIPITOR) 40 MG tablet, Take 40 mg by mouth at bedtime., Disp: , Rfl:    BELBUCA  300 MCG FILM, Take by mouth 2 (two) times daily., Disp: , Rfl:    buPROPion  (WELLBUTRIN  XL) 300 MG 24 hr tablet, Take 300 mg by mouth every morning., Disp: , Rfl:    candesartan (ATACAND) 4 MG tablet, Take 4 mg by mouth daily., Disp: , Rfl:    colchicine 0.6 MG tablet, Take 0.6-1.2 mg by mouth See admin  instructions. Take 1.2 mg for the first initial dose then 0.6 mg daily., Disp: , Rfl:    Continuous Glucose Sensor (DEXCOM G7 SENSOR) MISC, SMARTSIG:Every Other Day, Disp: , Rfl:    doxycycline  (VIBRA -TABS) 100 MG tablet, Take 1 tablet (100 mg total) by mouth 2 (two) times daily., Disp: 10 tablet, Rfl: 0   ELIQUIS  5 MG TABS tablet, Take 1 tablet (5 mg total) by mouth 2 (two) times daily., Disp: 180 tablet, Rfl: 3   FLUoxetine  (PROZAC ) 40 MG capsule, Take 40 mg by mouth every morning., Disp: , Rfl:    fluticasone -salmeterol (ADVAIR) 250-50 MCG/ACT AEPB, Inhale 1 puff into the lungs 2 (two) times daily., Disp: , Rfl:    gabapentin  (NEURONTIN ) 600 MG tablet, Take 600 mg by mouth at bedtime., Disp: , Rfl:    isosorbide  mononitrate (IMDUR ) 60 MG 24 hr tablet, Take 60 mg by mouth every morning., Disp: , Rfl:    LINZESS  145 MCG CAPS capsule, Take 145 mcg by mouth daily., Disp: , Rfl:    methimazole  (TAPAZOLE ) 5 MG tablet, Take 1 tablet (5 mg total) by mouth as directed. 1 tablet Monday through Saturday, none on Sundays, Disp: 78 tablet, Rfl: 3   metoprolol  succinate (  TOPROL -XL) 100 MG 24 hr tablet, Take 100 mg by mouth daily., Disp: , Rfl:    naloxone  (NARCAN ) nasal spray 4 mg/0.1 mL, Place 1 spray into the nose as needed (OD)., Disp: , Rfl:    omeprazole (PRILOSEC) 20 MG capsule, Take 20 mg by mouth daily., Disp: , Rfl:    pregabalin  (LYRICA ) 100 MG capsule, Take 1 capsule (100 mg total) by mouth 3 (three) times daily., Disp: 90 capsule, Rfl: 2   tirzepatide  (MOUNJARO ) 12.5 MG/0.5ML Pen, Inject 12.5 mg into the skin once a week., Disp: 6 mL, Rfl: 3   traMADol  (ULTRAM ) 50 MG tablet, Take 50 mg by mouth every 8 (eight) hours as needed., Disp: , Rfl:    TRESIBA  FLEXTOUCH 100 UNIT/ML FlexTouch Pen, Inject 64 Units into the skin daily. (Patient taking differently: Inject 54 Units into the skin daily.), Disp: , Rfl:    XIGDUO  XR 11-998 MG TB24, Take 2 tablets by mouth daily., Disp: , Rfl:    zolpidem  (AMBIEN )  5 MG tablet, Take 5 mg by mouth at bedtime., Disp: , Rfl:   Social History   Tobacco Use  Smoking Status Every Day   Current packs/day: 0.50   Average packs/day: 0.5 packs/day for 30.0 years (15.0 ttl pk-yrs)   Types: Cigarettes  Smokeless Tobacco Never    Allergies  Allergen Reactions   Other Shortness Of Breath    Hazelnuts   Corylus Other (See Comments)    Unknown    Pear Other (See Comments)    welts   Penicillin G Other (See Comments)    Whelps - childhood allergy   Trulicity [Dulaglutide] Other (See Comments)    Large itchy bumps   Objective:  There were no vitals filed for this visit. There is no height or weight on file to calculate BMI. Constitutional Well developed. Well nourished.  Vascular Foot warm and well perfused. Capillary refill normal to all digits.   Neurologic Normal speech. Oriented to person, place, and time. Epicritic sensation to light touch grossly present bilaterally.  Dermatologic Skin healing well without signs of infection. Skin edges well coapted without signs of infection.  Orthopedic: Tenderness to palpation noted about the surgical site.   Radiographs: interval resection of second toe proximal phalanx  Assessment:   1. Status post surgery   2. Diabetic polyneuropathy associated with type 2 diabetes mellitus (HCC)   3. Pain due to onychomycosis of toenails of both feet      Plan:  Patient was evaluated and treated and all questions answered.  S/p foot surgery right -Progressing as expected post-operatively. -WB Status: WBAT  in regular shoe -DM shoe prescription sent to [facility]  -Discussed and educated patient on diabetic foot care, especially with  regards to the vascular, neurological and musculoskeletal systems.  -Stressed the importance of good glycemic control and the detriment of not  controlling glucose levels in relation to the foot. -Discussed supportive shoes at all times and checking feet regularly.  -Mechanically  debrided all nails 1-5 bilateral using sterile nail nipper and filed with dremel without incident  -Answered all patient questions -Patient to return  in 3 months for at risk foot care -Patient advised to call the office if any problems or questions arise in the meantime.    Return in about 3 months (around 12/20/2023) for rfc.

## 2023-10-04 ENCOUNTER — Other Ambulatory Visit: Payer: Self-pay | Admitting: Cardiology

## 2023-10-04 ENCOUNTER — Other Ambulatory Visit: Payer: Self-pay | Admitting: Internal Medicine

## 2023-10-05 ENCOUNTER — Other Ambulatory Visit: Payer: Self-pay

## 2023-10-05 ENCOUNTER — Other Ambulatory Visit: Payer: Self-pay | Admitting: Cardiology

## 2023-10-08 ENCOUNTER — Other Ambulatory Visit: Payer: Self-pay | Admitting: Cardiology

## 2023-10-29 ENCOUNTER — Telehealth: Payer: Self-pay

## 2023-10-29 ENCOUNTER — Other Ambulatory Visit (HOSPITAL_COMMUNITY): Payer: Self-pay

## 2023-10-29 NOTE — Telephone Encounter (Signed)
 Pharmacy Patient Advocate Encounter   Received notification from CoverMyMeds that prior authorization for Mounjaro  7.5MG /0.5ML auto-injectors is required/requested.   Insurance verification completed.   The patient is insured through [facility] .   Per pt med list: Pt is currently on Mounjaro  12.5MG /0.5ML auto-injectors as prescribed 07/31/23, However, it looks like pt last picked up Mounjaro  10MG /0.5ML auto-injectors today (10/29/23 from a pharmacy in [place] ). Please advise what strength pt Is taking and if a PA is needed.

## 2023-10-30 NOTE — Telephone Encounter (Signed)
 LVMTCB and verify what dose of Mounjaro  she is taking

## 2023-10-31 NOTE — Telephone Encounter (Signed)
 I have left 2 message and sent mychart message to verify what does patient is using but no answer as of yet.

## 2023-11-13 ENCOUNTER — Ambulatory Visit (INDEPENDENT_AMBULATORY_CARE_PROVIDER_SITE_OTHER)

## 2023-11-13 VITALS — Ht 69.0 in | Wt 204.0 lb

## 2023-11-13 DIAGNOSIS — S91106A Unspecified open wound of unspecified lesser toe(s) without damage to nail, initial encounter: Secondary | ICD-10-CM

## 2023-11-13 DIAGNOSIS — L97522 Non-pressure chronic ulcer of other part of left foot with fat layer exposed: Secondary | ICD-10-CM

## 2023-11-13 DIAGNOSIS — E119 Type 2 diabetes mellitus without complications: Secondary | ICD-10-CM

## 2023-11-13 MED ORDER — DOXYCYCLINE HYCLATE 100 MG PO TABS: 100.0000 mg | ORAL_TABLET | Freq: Two times a day (BID) | ORAL | 0 refills | Status: DC

## 2023-11-13 NOTE — Progress Notes (Signed)
 Subjective:  Patient ID: Carmen Armstrong, female    DOB: 30-Jan-1966,  MRN: [id number]  Chief Complaint  Patient presents with   Wound Check    Rm 3 Patient is here for possible infection of the 3rd right and left toes. Toes are scabbed over, swelling and warm to the touch. Pt is diabetic (A1C 6.5).    DOS: 08/08/23 Procedure: Right second toe amputation   58 y.o. female returns to clinic for concerns about her left third toe.  She did have a right second toe amputation in June which she has done well with.  She states that approximately for 1 to 2 weeks she has had some swelling in her left third toe.  She denies any drainage and states that it scabbed over.  She is diabetic with an A1c of 6.5.  PCP:  Waylan Almarie SAUNDERS, MD     Review of Systems: Negative except as noted in the HPI. Denies N/V/F/Ch.  Past Medical History:  Diagnosis Date   Arrhythmia    Arthritis    Atrial fibrillation (HCC)    CAD (coronary artery disease)    Chronic lower back pain    Depression    Diabetes mellitus without complication (HCC)    Eczema    GAD (generalized anxiety disorder)    GERD (gastroesophageal reflux disease)    H/O vitamin D deficiency    Heart failure (HCC)    Hyperlipidemia    Hypertension    Hypothyroidism    Insomnia    Peripheral neuropathy    PVD (peripheral vascular disease)     Current Outpatient Medications:    AIRSUPRA 90-80 MCG/ACT AERO, Inhale 2 puffs into the lungs in the morning and at bedtime., Disp: , Rfl:    amLODipine  (NORVASC ) 10 MG tablet, TAKE 1 TABLET BY MOUTH DAILY *EMERGENCY REFILL*, Disp: 90 tablet, Rfl: 1   atorvastatin  (LIPITOR) 40 MG tablet, Take 40 mg by mouth at bedtime., Disp: , Rfl:    BELBUCA  300 MCG FILM, Take by mouth 2 (two) times daily., Disp: , Rfl:    buPROPion  (WELLBUTRIN  XL) 300 MG 24 hr tablet, Take 300 mg by mouth every morning., Disp: , Rfl:    candesartan (ATACAND) 4 MG tablet, Take 4 mg by mouth daily., Disp: , Rfl:     colchicine 0.6 MG tablet, Take 0.6-1.2 mg by mouth See admin instructions. Take 1.2 mg for the first initial dose then 0.6 mg daily., Disp: , Rfl:    Continuous Glucose Sensor (DEXCOM G7 SENSOR) MISC, SMARTSIG:Every Other Day, Disp: , Rfl:    doxycycline  (VIBRA -TABS) 100 MG tablet, Take 1 tablet (100 mg total) by mouth 2 (two) times daily., Disp: 20 tablet, Rfl: 0   ELIQUIS  5 MG TABS tablet, Take 1 tablet (5 mg total) by mouth 2 (two) times daily., Disp: 180 tablet, Rfl: 3   FLUoxetine  (PROZAC ) 40 MG capsule, Take 40 mg by mouth every morning., Disp: , Rfl:    fluticasone -salmeterol (ADVAIR) 250-50 MCG/ACT AEPB, Inhale 1 puff into the lungs 2 (two) times daily., Disp: , Rfl:    gabapentin  (NEURONTIN ) 600 MG tablet, Take 600 mg by mouth at bedtime., Disp: , Rfl:    isosorbide  mononitrate (IMDUR ) 60 MG 24 hr tablet, TAKE 1 TABLET BY MOUTH EVERY MORNING, Disp: 90 tablet, Rfl: 1   LINZESS  145 MCG CAPS capsule, Take 145 mcg by mouth daily., Disp: , Rfl:    methimazole  (TAPAZOLE ) 5 MG tablet, Take 1 tablet (5 mg total) by mouth as directed.  1 tablet Monday through Saturday, none on Sundays, Disp: 78 tablet, Rfl: 3   metoprolol  succinate (TOPROL -XL) 100 MG 24 hr tablet, Take 100 mg by mouth daily., Disp: , Rfl:    naloxone  (NARCAN ) nasal spray 4 mg/0.1 mL, Place 1 spray into the nose as needed (OD)., Disp: , Rfl:    omeprazole (PRILOSEC) 20 MG capsule, Take 20 mg by mouth daily., Disp: , Rfl:    tirzepatide  (MOUNJARO ) 12.5 MG/0.5ML Pen, Inject 12.5 mg into the skin once a week., Disp: 6 mL, Rfl: 3   traMADol  (ULTRAM ) 50 MG tablet, Take 50 mg by mouth every 8 (eight) hours as needed., Disp: , Rfl:    TRESIBA  FLEXTOUCH 200 UNIT/ML FlexTouch Pen, Inject 54 Units into the skin daily., Disp: 27 mL, Rfl: 0   XIGDUO  XR 11-998 MG TB24, Take 2 tablets by mouth daily., Disp: , Rfl:    zolpidem  (AMBIEN ) 5 MG tablet, Take 5 mg by mouth at bedtime., Disp: , Rfl:    pregabalin  (LYRICA ) 100 MG capsule, Take 1 capsule  (100 mg total) by mouth 3 (three) times daily., Disp: 90 capsule, Rfl: 2  Social History   Tobacco Use  Smoking Status Every Day   Current packs/day: 0.50   Average packs/day: 0.5 packs/day for 30.0 years (15.0 ttl pk-yrs)   Types: Cigarettes  Smokeless Tobacco Never    Allergies  Allergen Reactions   Other Shortness Of Breath    Hazelnuts   Corylus Other (See Comments)    Unknown    Pear Other (See Comments)    welts   Penicillin G Other (See Comments)    Whelps - childhood allergy   Trulicity [Dulaglutide] Other (See Comments)    Large itchy bumps   Objective:  There were no vitals filed for this visit. Body mass index is 30.13 kg/m. Constitutional Well developed. Well nourished.  Vascular Foot warm and well perfused. Capillary refill normal to all digits.   Neurologic Normal speech. Oriented to person, place, and time. Epicritic sensation to light touch grossly present bilaterally.  Dermatologic Second toe amputation site well-healed.  Hyperkeratotic lesion to left third toe that was debrided to reveal ulcer present to distal tip of left third toe.  There was minor purulent drainage which was cultured.  The area did not probe deep or to bone.  There is mild erythema and edema that extends to the forefoot without any streaking.  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  Rigid hammertoe deformity to left third toe.   Radiographs: Partial absence of 1st and 2nd toes on left foot, partial 5th ray amputation.  No distinct cortical irregularity of left third digit to suggest osteomyelitis.  No soft tissue emphysema. Assessment:   1. Open wound of third toe, unspecified laterality, initial encounter      Plan:  Patient was evaluated and treated and all questions answered.  Open wound of third toe left -Discussed with the patient open wound on her left third toe with minor purulent drainage.  Described in procedure  described low.  This did not appear to extend to the bone, her radiographs do not demonstrate any bone changes.  We discussed multiple treatment options ranging from conservative to surgical.  At this time, she will proceed with oral antibiotics.  Doxycycline  100 mg x 14 days p.o. twice daily was prescribed.  This may need to be updated pending culture results.  She will also apply antibiotic ointment and Band-Aid  the end of the toe. - We had a discussion about signs and symptoms of infection.  She will monitor closely for these and will contact our office or proceed to the ED if they become present.  Procedure: Debridement left 3rd digit  Skin prep: alcohol     Injectate: None required Procedure: Attention was directed to the distal aspect of the left third digit where hyperkeratotic lesion with subdermal bleeding was noted to be present.  A 312 blade was used for sharp debridement of overlying hyperkeratotic tissue to expose a small ulcer measuring approximately 0.5 cm x 0.3 cm by superficial.  This extended down to to the subcutaneous tissue.  A small amount of purulent drainage was encountered and was subsequently cultured.  There was no deep probing of the wound.  The wound was then cleansed with wound cleanser and antibiotic ointment applied. Disposition: Patient tolerated procedure well. Injection site dressed with a band-aid.  RTC 2 weeks

## 2023-11-17 LAB — WOUND CULTURE
MICRO NUMBER:: 17042299
SPECIMEN QUALITY:: ADEQUATE

## 2023-11-28 ENCOUNTER — Other Ambulatory Visit: Payer: Self-pay | Admitting: Internal Medicine

## 2023-12-03 ENCOUNTER — Encounter: Payer: Self-pay | Admitting: Podiatry

## 2023-12-03 ENCOUNTER — Ambulatory Visit: Admitting: Podiatry

## 2023-12-03 DIAGNOSIS — M2042 Other hammer toe(s) (acquired), left foot: Secondary | ICD-10-CM

## 2023-12-03 DIAGNOSIS — E08621 Diabetes mellitus due to underlying condition with foot ulcer: Secondary | ICD-10-CM | POA: Diagnosis not present

## 2023-12-03 DIAGNOSIS — L97522 Non-pressure chronic ulcer of other part of left foot with fat layer exposed: Secondary | ICD-10-CM | POA: Diagnosis not present

## 2023-12-03 NOTE — Progress Notes (Signed)
 Subjective:  Patient ID: Carmen Armstrong, female    DOB: Jun 30, 1965,   MRN: [id number]  Chief Complaint  Patient presents with   Diabetic Ulcer    It's sore.  It's not as bad as it was.  I don't want it to get bad.  I'm trying to catch it at the beginning.  Saw Dr. Almarie Scala - 11/26/2023; A1c - 6.5     58 y.o. female presents for follow-up of left third toe ulceration. She she was seen by Dr. Magdalen on 930 and given antibiotics.  Relates doing okay and better than it was.  She relates the right foot is doing well and no issues.  . Denies any other pedal complaints. Denies n/v/f/c.   Past Medical History:  Diagnosis Date   Arrhythmia    Arthritis    Atrial fibrillation (HCC)    CAD (coronary artery disease)    Chronic lower back pain    Depression    Diabetes mellitus without complication (HCC)    Eczema    GAD (generalized anxiety disorder)    GERD (gastroesophageal reflux disease)    H/O vitamin D deficiency    Heart failure (HCC)    Hyperlipidemia    Hypertension    Hypothyroidism    Insomnia    Peripheral neuropathy    PVD (peripheral vascular disease)     Objective:  Physical Exam: Vascular: DP/PT pulses 2/4 bilateral. CFT <3 seconds. Feet cold to touch. Absent hair growth on digits. Edema noted to bilateral lower extremities. Xerosis noted bilaterally.  Skin. No lacerations or abrasions bilateral feet. Nails 1-5 bilateral  are thickened discolored and elongated with subungual debris. Left third digit distal ulceration noted with granular fibrinous base. No erythema edema or purulence noted. No probe to bone.  Musculoskeletal: MMT 5/5 bilateral lower extremities in DF, PF, Inversion and Eversion. Deceased ROM in DF of ankle joint. Partial amputation of left hallux and second digit and right hallux and second digit. Hammered third digit noted on left.  Neurological: Sensation intact to light touch. Protective sensation diminished bilateral.    Assessment:    1. Diabetic ulcer of toe of left foot associated with diabetes mellitus due to underlying condition, with fat layer exposed (HCC)   2. Hammertoe of left foot      Plan:  Patient was evaluated and treated and all questions answered. Ulcer left third digit with fat layer exposed -X-rays reviewed no osseous erosions noted.  -Debridement as below. -Dressed with Betadine, DSD. -Off-loading with surgical shoe. -No abx indicated.  -Discussed glucose control and proper protein-rich diet.  -Discussed if any worsening redness, pain, fever or chills to call or may need to report to the emergency room. Patient expressed understanding.  - Benefit of flexor sodomy at the left third digit to help with ulceration healing.  Patient in agreement procedure below  .Procedure: Flexor Tenotomy Indication for Procedure: toe with semi-reducible hammertoe with distal tip ulceration. Flexor tenotomy indicated to alleviate contracture, reduce pressure, and enhance healing of the ulceration. Location: left, 3rd toe Anesthesia:  Enuropathy  Instrumentation: 18 gauge needle  Technique: The toe was prepped in the usual fashion. The toe was exanquinated and a tourniquet was secured at the base of the toe. A 18 gauge needle was then used to make a transverse incision over the plantar aspect of the distal interphalangeal joint. The flexor tendon was incised with noted release of the hammertoe deformity. The incision was then irrigated and dressed with sterile dressing. Patient tolerated  the procedure well. Dressing: Dry, sterile, compression dressing. Disposition: Patient tolerated procedure well. Patient to return in 1 week for follow-up.    Procedure: Excisional Debridement of Wound Tool: Sharp #312 chisel blade/tissue nipper Type of Debridement: Sharp Excisional Frequency: Every two weeks until appropriately healed.  Dressing is to be changed daily/keeping the wound clean and dry Rationale: Removal of  non-viable soft tissue from the wound to promote healing.  Anesthesia: none Pre-Debridement Wound Measurements: 0.2 cm x 0.2 cm x 0.2 cm  Post-Debridement Wound Measurements: 0.3 cm x 0.4 cm x 0.3 cm  Area devitalized tissue removed(nonviable tissue only): 0.1 cm x 0.1 cm.  Blood loss: Minimal (<10cc) Depth of Debridement: with fat layer exposed Description of tissue removed: Slough and Necrotic Technique: The wound and the surrounding skin were prepped and draped in usual aseptic fashion.  Aseptic technique was maintained throughout the procedure.  Using #312 blade/tissue nipper sharp debridement of necrotic/nonviable tissue was performed until healthy bleeding wound bed was achieved.  No underlying bone or tendon was exposed during debridement.  The wound was thoroughly irrigated with normal saline solution Wound Progress:  Current Wound Volume: Debridement was performed of the chronic nonhealing diabetic foot wound on distal left third digit .  Debridement removed 0.1 cm x 0.1 cm of the necrotic tissue and subcutaneous tissue and  purulent drainage was not present. Presence/absence of tissue: Necrotic tissue/nonviable tissue present at the base of the wound.  Sharp debridement was performed to remove the necrotic tissue/nonviable tissue back to viable tissue.  No devitalized/nonviable tissue present postdebridement.  Wound appeared clean and clear of infection No material in the wound was present that was identified to be inhibiting healing. Dressing: Dry, sterile, compression dressing. Disposition: Patient tolerated procedure well. Patient to return in 2 weeks for follow-up or as listed above.  Return in about 2 weeks (around 12/17/2023) for wound check.   Asberry Failing, DPM

## 2023-12-17 ENCOUNTER — Encounter: Payer: Self-pay | Admitting: Podiatry

## 2023-12-17 ENCOUNTER — Ambulatory Visit (INDEPENDENT_AMBULATORY_CARE_PROVIDER_SITE_OTHER): Admitting: Podiatry

## 2023-12-17 VITALS — BP 172/74 | Temp 99.1°F

## 2023-12-17 DIAGNOSIS — E08621 Diabetes mellitus due to underlying condition with foot ulcer: Secondary | ICD-10-CM | POA: Diagnosis not present

## 2023-12-17 DIAGNOSIS — L97522 Non-pressure chronic ulcer of other part of left foot with fat layer exposed: Secondary | ICD-10-CM

## 2023-12-17 NOTE — Progress Notes (Signed)
 Subjective:  Patient ID: Carmen Armstrong, female    DOB: [birth year],   MRN: [id number]  Chief Complaint  Patient presents with   Diabetic Ulcer    It's better, there's still one little piece that's still not healing though.  Saw Dr. Almarie Scala - 11/26/2023; A1c - 6.5        58 y.o. female presents for follow-up of left third toe ulceration.  Relates doing well and has been dressing as instructed.. Denies any other pedal complaints. Denies n/v/f/c.   Past Medical History:  Diagnosis Date   Arrhythmia    Arthritis    Atrial fibrillation (HCC)    CAD (coronary artery disease)    Chronic lower back pain    Depression    Diabetes mellitus without complication (HCC)    Eczema    GAD (generalized anxiety disorder)    GERD (gastroesophageal reflux disease)    H/O vitamin D deficiency    Heart failure (HCC)    Hyperlipidemia    Hypertension    Hypothyroidism    Insomnia    Peripheral neuropathy    PVD (peripheral vascular disease)     Objective:  Physical Exam: Vascular: DP/PT pulses 2/4 bilateral. CFT <3 seconds. Feet cold to touch. Absent hair growth on digits. Edema noted to bilateral lower extremities. Xerosis noted bilaterally.  Skin. No lacerations or abrasions bilateral feet. Nails 1-5 bilateral  are thickened discolored and elongated with subungual debris. Left third digit distal ulceration noted with granular fibrinous base. No erythema edema or purulence noted. No probe to bone.  Musculoskeletal: MMT 5/5 bilateral lower extremities in DF, PF, Inversion and Eversion. Deceased ROM in DF of ankle joint. Partial amputation of left hallux and second digit and right hallux and second digit. Hammered third digit noted on left now and more rectus position. Neurological: Sensation intact to light touch. Protective sensation diminished bilateral.    Assessment:   1. Diabetic ulcer of toe of left foot associated with diabetes mellitus due to underlying condition, with  fat layer exposed (HCC)       Plan:  Patient was evaluated and treated and all questions answered. Ulcer left third digit with fat layer exposed -X-rays reviewed no osseous erosions noted.  -Debridement as below. -Dressed with Betadine, DSD. -Off-loading with surgical shoe. -No abx indicated.  -Discussed glucose control and proper protein-rich diet.  -Discussed if any worsening redness, pain, fever or chills to call or may need to report to the emergency room. Patient expressed understanding.  - Flexor tenotomy site well-healed.  Toe in more rectus position    Procedure: Excisional Debridement of Wound Tool: Sharp #312 chisel blade/tissue nipper Type of Debridement: Sharp Excisional Frequency: Every two weeks until appropriately healed.  Dressing is to be changed daily/keeping the wound clean and dry Rationale: Removal of non-viable soft tissue from the wound to promote healing.  Anesthesia: none Pre-Debridement Wound Measurements: 0.2 cm x 0.2 cm x 0.2 cm  Post-Debridement Wound Measurements: 1 cm x 0.4 cm x 0.2 cm  Area devitalized tissue removed(nonviable tissue only): 1 cm x 0.1 cm.  Blood loss: Minimal (<10cc) Depth of Debridement: with fat layer exposed Description of tissue removed: Slough and Necrotic Technique: The wound and the surrounding skin were prepped and draped in usual aseptic fashion.  Aseptic technique was maintained throughout the procedure.  Using #312 blade/tissue nipper sharp debridement of necrotic/nonviable tissue was performed until healthy bleeding wound bed was achieved.  No underlying bone or tendon was exposed during debridement.  The wound was thoroughly irrigated with normal saline solution Wound Progress:  Current Wound Volume: Debridement was performed of the chronic nonhealing diabetic foot wound on distal left third digit .  Debridement removed 1 cm x 0.1 cm of the necrotic tissue and subcutaneous tissue and  purulent drainage was not  present. Presence/absence of tissue: Necrotic tissue/nonviable tissue present at the base of the wound.  Sharp debridement was performed to remove the necrotic tissue/nonviable tissue back to viable tissue.  No devitalized/nonviable tissue present postdebridement.  Wound appeared clean and clear of infection No material in the wound was present that was identified to be inhibiting healing. Dressing: Dry, sterile, compression dressing. Disposition: Patient tolerated procedure well. Patient to return in 2 weeks for follow-up or as listed above.  Return in about 2 weeks (around 12/31/2023) for wound check.   Asberry Failing, DPM

## 2023-12-19 ENCOUNTER — Ambulatory Visit: Admitting: Podiatry

## 2023-12-20 ENCOUNTER — Ambulatory Visit (INDEPENDENT_AMBULATORY_CARE_PROVIDER_SITE_OTHER)

## 2023-12-20 ENCOUNTER — Encounter: Payer: Self-pay | Admitting: Podiatry

## 2023-12-20 ENCOUNTER — Ambulatory Visit (INDEPENDENT_AMBULATORY_CARE_PROVIDER_SITE_OTHER): Admitting: Podiatry

## 2023-12-20 VITALS — BP 160/81 | HR 84 | Temp 98.8°F

## 2023-12-20 DIAGNOSIS — E08621 Diabetes mellitus due to underlying condition with foot ulcer: Secondary | ICD-10-CM

## 2023-12-20 DIAGNOSIS — L97524 Non-pressure chronic ulcer of other part of left foot with necrosis of bone: Secondary | ICD-10-CM | POA: Diagnosis not present

## 2023-12-20 DIAGNOSIS — L97522 Non-pressure chronic ulcer of other part of left foot with fat layer exposed: Secondary | ICD-10-CM | POA: Diagnosis not present

## 2023-12-20 MED ORDER — DOXYCYCLINE HYCLATE 100 MG PO TABS: 100.0000 mg | ORAL_TABLET | Freq: Two times a day (BID) | ORAL | 0 refills | Status: DC

## 2023-12-20 NOTE — Progress Notes (Signed)
 Subjective:  Patient ID: Carmen Armstrong, female    DOB: 05-03-1965,   MRN: [id number]  Chief Complaint  Patient presents with   Diabetic Ulcer    It's not good, my whole foot is sore.  It hurts so bad.  I can hardly walk sometime.  It's swollen.  I get sharp pain. Saw Dr. Almarie Scala - 11/26/2023; A1c - 6.5     58 y.o. female presents for follow-up of left third toe ulceration.  Relates she was seen 3 days ago but has had severe pain in the toe and foot and feels like she can barely walk at times.  She relates sharp pain and swelling in the top.  She denies nausea vomiting fever chills.. Denies any other pedal complaints. Patient is diabetic and last A1c was  Lab Results  Component Value Date   HGBA1C 6.7 (A) 07/31/2023   .   PCP:  Scala Almarie SAUNDERS, MD     Past Medical History:  Diagnosis Date   Arrhythmia    Arthritis    Atrial fibrillation (HCC)    CAD (coronary artery disease)    Chronic lower back pain    Depression    Diabetes mellitus without complication (HCC)    Eczema    GAD (generalized anxiety disorder)    GERD (gastroesophageal reflux disease)    H/O vitamin D deficiency    Heart failure (HCC)    Hyperlipidemia    Hypertension    Hypothyroidism    Insomnia    Peripheral neuropathy    PVD (peripheral vascular disease)     Objective:  Physical Exam: Vascular: DP/PT pulses 1/4 bilateral. CFT <3 seconds. Feet cold to touch. Absent hair growth on digits. Edema noted to bilateral lower extremities. Xerosis noted bilaterally.  Skin. No lacerations or abrasions bilateral feet. Nails 1-5 bilateral  are thickened discolored and elongated with subungual debris. Left third digit distal ulceration noted with granular fibrinous base. No erythema edema or purulence noted. No probe to bone.  Some darkening noted around the distal aspect of the left third toe around the wound. Musculoskeletal: MMT 5/5 bilateral lower extremities in DF, PF, Inversion and Eversion.  Deceased ROM in DF of ankle joint. Partial amputation of left hallux and second digit and right hallux and second digit. Hammered third digit noted on left now and more rectus position. Neurological: Sensation intact to light touch. Protective sensation diminished bilateral.    Assessment:   1. 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 Ulcer left third digit with fat layer exposed -X-rays reviewed .  Suspect osseous erosion of the distal phalanx of the left third digit. -MRI ordered for further evaluation. - No debridement necessary today. -Dressed with Betadine, DSD. -Off-loading with surgical shoe. -Doxycycline  sent to pharmacy. -ABIs ordered stat and referral for vascular surgery placed.  Discussed with patient feel that this could be vascular in nature causing the pain. Discussed if antibiotics do not help in the next couple days-and the toe begins to worsening pain or darkness to go to the emergency room.. -Discussed glucose control and proper protein-rich diet.  -Discussed if any worsening redness, pain, fever or chills to call or may need to report to the emergency room. Patient expressed understanding.     .  Return in about 2 weeks (around 01/03/2024) for wound  check.   Asberry Failing, DPM

## 2023-12-21 ENCOUNTER — Ambulatory Visit (HOSPITAL_COMMUNITY)
Admission: RE | Admit: 2023-12-21 | Discharge: 2023-12-21 | Disposition: A | Discharge status: Home / Self Care | Source: Ambulatory Visit | Attending: Podiatry | Admitting: Podiatry

## 2023-12-21 DIAGNOSIS — L97524 Non-pressure chronic ulcer of other part of left foot with necrosis of bone: Secondary | ICD-10-CM | POA: Diagnosis not present

## 2023-12-21 DIAGNOSIS — E11621 Type 2 diabetes mellitus with foot ulcer: Secondary | ICD-10-CM | POA: Diagnosis not present

## 2023-12-21 DIAGNOSIS — E08621 Diabetes mellitus due to underlying condition with foot ulcer: Secondary | ICD-10-CM

## 2023-12-21 LAB — VAS US ABI WITH/WO TBI
Left ABI: 0.9
Right ABI: 0.94

## 2023-12-26 ENCOUNTER — Telehealth: Payer: Self-pay | Admitting: Podiatry

## 2023-12-26 NOTE — Telephone Encounter (Signed)
 Radiology Dept called to verify if the X-ray was received. Please call Tiffany at [phone] to confirm yes or no. If not received, she can resend it. Thank you.

## 2023-12-31 ENCOUNTER — Encounter: Payer: Self-pay | Admitting: Podiatry

## 2023-12-31 ENCOUNTER — Ambulatory Visit: Admitting: Podiatry

## 2023-12-31 VITALS — BP 149/71 | Temp 98.8°F

## 2023-12-31 DIAGNOSIS — L97524 Non-pressure chronic ulcer of other part of left foot with necrosis of bone: Secondary | ICD-10-CM

## 2023-12-31 DIAGNOSIS — E08621 Diabetes mellitus due to underlying condition with foot ulcer: Secondary | ICD-10-CM

## 2023-12-31 NOTE — Progress Notes (Signed)
 Subjective:  Patient ID: Carmen Armstrong, female    DOB: 03-30-1965,   MRN: [id number]  Chief Complaint  Patient presents with   Diabetic Ulcer    It's still giving me problem.  It's still sore.  My foot is swollen.  Sometime it hurts really bad.  See Dr. Sam 01/01/2024; A1c - 6.5     58 y.o. female presents for follow-up of left third toe ulceration.  Relates worsening of the third toe.  She relates it swollen and still very sore.  Also relates the recent loss of her mother and is very upset today.  Denies any other pedal complaints. Patient is diabetic and last A1c was  Lab Results  Component Value Date   HGBA1C 6.7 (A) 07/31/2023   .   PCP:  Waylan Almarie SAUNDERS, MD     Past Medical History:  Diagnosis Date   Arrhythmia    Arthritis    Atrial fibrillation (HCC)    CAD (coronary artery disease)    Chronic lower back pain    Depression    Diabetes mellitus without complication (HCC)    Eczema    GAD (generalized anxiety disorder)    GERD (gastroesophageal reflux disease)    H/O vitamin D deficiency    Heart failure (HCC)    Hyperlipidemia    Hypertension    Hypothyroidism    Insomnia    Peripheral neuropathy    PVD (peripheral vascular disease)     Objective:  Physical Exam: Vascular: DP/PT pulses 1/4 bilateral. CFT <3 seconds. Feet cold to touch. Absent hair growth on digits. Edema noted to bilateral lower extremities. Xerosis noted bilaterally.  Skin. No lacerations or abrasions bilateral feet. Nails 1-5 bilateral  are thickened discolored and elongated with subungual debris. Left third digit distal ulceration noted with granular fibrinous base. No erythema edema or purulence noted. No probe to bone.  Some darkening noted around the distal aspect of the left third toe around the wound. Musculoskeletal: MMT 5/5 bilateral lower extremities in DF, PF, Inversion and Eversion. Deceased ROM in DF of ankle joint. Partial amputation of left hallux and second digit  and right hallux and second digit. Hammered third digit noted on left now and more rectus position. Neurological: Sensation intact to light touch. Protective sensation diminished bilateral.       Summary: Right: Resting right ankle-brachial index indicates mild right lower extremity arterial disease. Right toe pressure is >60 mmHg which suggests adequate perfusion for healing. Left: Resting left ankle-brachial index indicates mild left lower extremity arterial disease. Left toe pressure is >60 mmHg which suggests adequate perfusion for healing.  Assessment:   1. Diabetic ulcer of toe of left foot associated with diabetes mellitus due to underlying condition, with necrosis of bone (HCC)         Plan:  Patient was evaluated and treated and all questions answered. Ulcer left third digit with fat layer exposed -X-rays reviewed .  Suspect osseous erosion of the distal phalanx of the left third digit. -MRI ordered for further evaluation still pending.  - No debridement necessary today. -Dressed with Betadine, DSD. -Off-loading with surgical shoe. -ABIS reviewed and adequate perfusion for healing noted.  Discussed  given worsening and concern for failure of antibiotics and need for MRI would recommend going to the emergency room as she will need amputation of this toe.  Patient in agreement and will plan to head to the emergency room tomorrow. -Notified Dr. Jimmy on-call of plan.    SABRA  No follow-ups on file.   Asberry Failing, DPM

## 2024-01-01 ENCOUNTER — Encounter (HOSPITAL_COMMUNITY): Payer: Self-pay

## 2024-01-01 ENCOUNTER — Inpatient Hospital Stay (HOSPITAL_COMMUNITY)
Admission: EM | Admit: 2024-01-01 | Discharge: 2024-01-02 | DRG: 617 | Disposition: A | Discharge status: Home / Self Care | Attending: Emergency Medicine | Admitting: Emergency Medicine

## 2024-01-01 ENCOUNTER — Inpatient Hospital Stay (HOSPITAL_COMMUNITY)

## 2024-01-01 ENCOUNTER — Emergency Department (HOSPITAL_COMMUNITY)

## 2024-01-01 ENCOUNTER — Ambulatory Visit: Admitting: Internal Medicine

## 2024-01-01 ENCOUNTER — Other Ambulatory Visit: Payer: Self-pay

## 2024-01-01 DIAGNOSIS — L089 Local infection of the skin and subcutaneous tissue, unspecified: Secondary | ICD-10-CM | POA: Diagnosis not present

## 2024-01-01 DIAGNOSIS — K219 Gastro-esophageal reflux disease without esophagitis: Secondary | ICD-10-CM | POA: Diagnosis not present

## 2024-01-01 DIAGNOSIS — M868X7 Other osteomyelitis, ankle and foot: Secondary | ICD-10-CM | POA: Diagnosis not present

## 2024-01-01 DIAGNOSIS — G894 Chronic pain syndrome: Secondary | ICD-10-CM | POA: Diagnosis present

## 2024-01-01 DIAGNOSIS — I110 Hypertensive heart disease with heart failure: Secondary | ICD-10-CM | POA: Diagnosis present

## 2024-01-01 DIAGNOSIS — Z89422 Acquired absence of other left toe(s): Secondary | ICD-10-CM

## 2024-01-01 DIAGNOSIS — I2510 Atherosclerotic heart disease of native coronary artery without angina pectoris: Secondary | ICD-10-CM | POA: Diagnosis present

## 2024-01-01 DIAGNOSIS — I5032 Chronic diastolic (congestive) heart failure: Secondary | ICD-10-CM | POA: Diagnosis not present

## 2024-01-01 DIAGNOSIS — E11621 Type 2 diabetes mellitus with foot ulcer: Secondary | ICD-10-CM | POA: Diagnosis not present

## 2024-01-01 DIAGNOSIS — Z7951 Long term (current) use of inhaled steroids: Secondary | ICD-10-CM

## 2024-01-01 DIAGNOSIS — E785 Hyperlipidemia, unspecified: Secondary | ICD-10-CM | POA: Diagnosis not present

## 2024-01-01 DIAGNOSIS — F411 Generalized anxiety disorder: Secondary | ICD-10-CM | POA: Diagnosis present

## 2024-01-01 DIAGNOSIS — E1152 Type 2 diabetes mellitus with diabetic peripheral angiopathy with gangrene: Secondary | ICD-10-CM | POA: Diagnosis not present

## 2024-01-01 DIAGNOSIS — I480 Paroxysmal atrial fibrillation: Secondary | ICD-10-CM | POA: Diagnosis not present

## 2024-01-01 DIAGNOSIS — E1142 Type 2 diabetes mellitus with diabetic polyneuropathy: Secondary | ICD-10-CM | POA: Diagnosis present

## 2024-01-01 DIAGNOSIS — Z6828 Body mass index (BMI) 28.0-28.9, adult: Secondary | ICD-10-CM

## 2024-01-01 DIAGNOSIS — Z833 Family history of diabetes mellitus: Secondary | ICD-10-CM

## 2024-01-01 DIAGNOSIS — I472 Ventricular tachycardia, unspecified: Secondary | ICD-10-CM | POA: Diagnosis not present

## 2024-01-01 DIAGNOSIS — Z8249 Family history of ischemic heart disease and other diseases of the circulatory system: Secondary | ICD-10-CM | POA: Diagnosis not present

## 2024-01-01 DIAGNOSIS — Z880 Allergy status to penicillin: Secondary | ICD-10-CM

## 2024-01-01 DIAGNOSIS — Z7901 Long term (current) use of anticoagulants: Secondary | ICD-10-CM | POA: Diagnosis not present

## 2024-01-01 DIAGNOSIS — E039 Hypothyroidism, unspecified: Secondary | ICD-10-CM | POA: Diagnosis not present

## 2024-01-01 DIAGNOSIS — M869 Osteomyelitis, unspecified: Secondary | ICD-10-CM | POA: Diagnosis not present

## 2024-01-01 DIAGNOSIS — E663 Overweight: Secondary | ICD-10-CM | POA: Diagnosis not present

## 2024-01-01 DIAGNOSIS — Z7985 Long-term (current) use of injectable non-insulin antidiabetic drugs: Secondary | ICD-10-CM | POA: Diagnosis not present

## 2024-01-01 DIAGNOSIS — Z79899 Other long term (current) drug therapy: Secondary | ICD-10-CM | POA: Diagnosis not present

## 2024-01-01 DIAGNOSIS — Z91018 Allergy to other foods: Secondary | ICD-10-CM

## 2024-01-01 DIAGNOSIS — E440 Moderate protein-calorie malnutrition: Secondary | ICD-10-CM | POA: Diagnosis present

## 2024-01-01 DIAGNOSIS — E119 Type 2 diabetes mellitus without complications: Secondary | ICD-10-CM

## 2024-01-01 DIAGNOSIS — L97529 Non-pressure chronic ulcer of other part of left foot with unspecified severity: Secondary | ICD-10-CM | POA: Diagnosis not present

## 2024-01-01 DIAGNOSIS — Z888 Allergy status to other drugs, medicaments and biological substances status: Secondary | ICD-10-CM

## 2024-01-01 DIAGNOSIS — E1169 Type 2 diabetes mellitus with other specified complication: Secondary | ICD-10-CM | POA: Diagnosis not present

## 2024-01-01 LAB — CBC WITH DIFFERENTIAL/PLATELET
Abs Immature Granulocytes: 0.01 K/uL (ref 0.00–0.07)
Basophils Absolute: 0 K/uL (ref 0.0–0.1)
Basophils Relative: 1 %
Eosinophils Absolute: 0.1 K/uL (ref 0.0–0.5)
Eosinophils Relative: 2 %
HCT: 37.8 % (ref 36.0–46.0)
Hemoglobin: 11.5 g/dL — ABNORMAL LOW (ref 12.0–15.0)
Immature Granulocytes: 0 %
Lymphocytes Relative: 42 %
Lymphs Abs: 2.7 K/uL (ref 0.7–4.0)
MCH: 25.3 pg — ABNORMAL LOW (ref 26.0–34.0)
MCHC: 30.4 g/dL (ref 30.0–36.0)
MCV: 83.1 fL (ref 80.0–100.0)
Monocytes Absolute: 0.5 K/uL (ref 0.1–1.0)
Monocytes Relative: 8 %
Neutro Abs: 3.1 K/uL (ref 1.7–7.7)
Neutrophils Relative %: 47 %
Platelets: 262 K/uL (ref 150–400)
RBC: 4.55 MIL/uL (ref 3.87–5.11)
RDW: 15.7 % — ABNORMAL HIGH (ref 11.5–15.5)
WBC: 6.5 K/uL (ref 4.0–10.5)
nRBC: 0 % (ref 0.0–0.2)

## 2024-01-01 LAB — BASIC METABOLIC PANEL WITH GFR
Anion gap: 12 (ref 5–15)
BUN: 10 mg/dL (ref 6–20)
CO2: 24 mmol/L (ref 22–32)
Calcium: 9 mg/dL (ref 8.9–10.3)
Chloride: 101 mmol/L (ref 98–111)
Creatinine, Ser: 0.69 mg/dL (ref 0.44–1.00)
GFR, Estimated: >60 mL/min (ref >60)
Glucose, Bld: 92 mg/dL (ref 70–99)
Potassium: 3.8 mmol/L (ref 3.5–5.1)
Sodium: 137 mmol/L (ref 135–145)

## 2024-01-01 LAB — MAGNESIUM: Magnesium: 1.5 mg/dL — ABNORMAL LOW (ref 1.7–2.4)

## 2024-01-01 LAB — C-REACTIVE PROTEIN: CRP: <0.5 mg/dL (ref <1.0)

## 2024-01-01 LAB — I-STAT CG4 LACTIC ACID, ED: Lactic Acid, Venous: 1.2 mmol/L (ref 0.5–1.9)

## 2024-01-01 LAB — GLUCOSE, CAPILLARY
Glucose-Capillary: 85 mg/dL (ref 70–99)
Glucose-Capillary: 98 mg/dL (ref 70–99)

## 2024-01-01 LAB — SURGICAL PCR SCREEN
MRSA, PCR: NEGATIVE
Staphylococcus aureus: NEGATIVE

## 2024-01-01 LAB — ETHANOL: Alcohol, Ethyl (B): <15 mg/dL (ref <15)

## 2024-01-01 LAB — PREALBUMIN: Prealbumin: 21 mg/dL (ref 18–38)

## 2024-01-01 LAB — SEDIMENTATION RATE: Sed Rate: 20 mm/h (ref 0–22)

## 2024-01-01 MED ORDER — BUPROPION HCL ER (XL) 150 MG PO TB24
300.0000 mg | ORAL_TABLET | Freq: Every morning | ORAL | Status: DC
  Administered 2024-01-02: 300 mg via ORAL
  Filled 2024-01-01: qty 2

## 2024-01-01 MED ORDER — MORPHINE SULFATE (PF) 2 MG/ML IV SOLN
2.0000 mg | INTRAVENOUS | Status: DC | PRN
  Administered 2024-01-02: 2 mg via INTRAVENOUS
  Filled 2024-01-01: qty 1

## 2024-01-01 MED ORDER — SODIUM CHLORIDE 0.9 % IV SOLN
2.0000 g | Freq: Once | INTRAVENOUS | Status: AC
  Administered 2024-01-01: 2 g via INTRAVENOUS
  Filled 2024-01-01: qty 20

## 2024-01-01 MED ORDER — INSULIN ASPART 100 UNIT/ML IJ SOLN
0.0000 [IU] | Freq: Three times a day (TID) | INTRAMUSCULAR | Status: DC
  Administered 2024-01-02: 3 [IU] via SUBCUTANEOUS
  Filled 2024-01-01: qty 3

## 2024-01-01 MED ORDER — ONDANSETRON HCL 4 MG/2ML IJ SOLN
4.0000 mg | Freq: Once | INTRAMUSCULAR | Status: AC
  Administered 2024-01-01: 4 mg via INTRAVENOUS
  Filled 2024-01-01: qty 2

## 2024-01-01 MED ORDER — VANCOMYCIN HCL IN DEXTROSE 1-5 GM/200ML-% IV SOLN: 1000.0000 mg | Freq: Once | INTRAVENOUS | Status: DC

## 2024-01-01 MED ORDER — NICOTINE 21 MG/24HR TD PT24
21.0000 mg | MEDICATED_PATCH | Freq: Every day | TRANSDERMAL | Status: DC
  Administered 2024-01-01 – 2024-01-02 (×2): 21 mg via TRANSDERMAL
  Filled 2024-01-01 (×2): qty 1

## 2024-01-01 MED ORDER — ACETAMINOPHEN 650 MG RE SUPP: 650.0000 mg | Freq: Four times a day (QID) | RECTAL | Status: DC | PRN

## 2024-01-01 MED ORDER — METRONIDAZOLE 500 MG/100ML IV SOLN
500.0000 mg | Freq: Two times a day (BID) | INTRAVENOUS | Status: DC
  Administered 2024-01-02: 500 mg via INTRAVENOUS
  Filled 2024-01-01 (×2): qty 100

## 2024-01-01 MED ORDER — VANCOMYCIN HCL 1750 MG/350ML IV SOLN
1750.0000 mg | Freq: Once | INTRAVENOUS | Status: AC
  Administered 2024-01-01: 1750 mg via INTRAVENOUS
  Filled 2024-01-01: qty 350

## 2024-01-01 MED ORDER — VANCOMYCIN HCL 750 MG/150ML IV SOLN
750.0000 mg | Freq: Three times a day (TID) | INTRAVENOUS | Status: DC
  Administered 2024-01-01 – 2024-01-02 (×2): 750 mg via INTRAVENOUS
  Filled 2024-01-01 (×4): qty 150

## 2024-01-01 MED ORDER — ATORVASTATIN CALCIUM 40 MG PO TABS
40.0000 mg | ORAL_TABLET | Freq: Every day | ORAL | Status: DC
  Administered 2024-01-01: 40 mg via ORAL
  Filled 2024-01-01: qty 1

## 2024-01-01 MED ORDER — IRBESARTAN 75 MG PO TABS
37.5000 mg | ORAL_TABLET | Freq: Every day | ORAL | Status: DC
  Administered 2024-01-02: 37.5 mg via ORAL
  Filled 2024-01-01 (×3): qty 0.5

## 2024-01-01 MED ORDER — METRONIDAZOLE 500 MG/100ML IV SOLN
500.0000 mg | Freq: Once | INTRAVENOUS | Status: AC
  Administered 2024-01-01: 500 mg via INTRAVENOUS
  Filled 2024-01-01: qty 100

## 2024-01-01 MED ORDER — MORPHINE SULFATE (PF) 4 MG/ML IV SOLN
4.0000 mg | Freq: Once | INTRAVENOUS | Status: AC
  Administered 2024-01-01: 4 mg via INTRAVENOUS
  Filled 2024-01-01: qty 1

## 2024-01-01 MED ORDER — SODIUM CHLORIDE 0.9 % IV SOLN: INTRAVENOUS | Status: AC

## 2024-01-01 MED ORDER — BUDESONIDE-FORMOTEROL FUMARATE 160-4.5 MCG/ACT IN AERO
2.0000 | INHALATION_SPRAY | Freq: Two times a day (BID) | RESPIRATORY_TRACT | Status: DC
  Filled 2024-01-01: qty 6

## 2024-01-01 MED ORDER — SODIUM CHLORIDE 0.9 % IV SOLN
2.0000 g | INTRAVENOUS | Status: DC
  Administered 2024-01-02: 2 g via INTRAVENOUS
  Filled 2024-01-01: qty 20

## 2024-01-01 MED ORDER — ACETAMINOPHEN 325 MG PO TABS: 650.0000 mg | ORAL_TABLET | Freq: Four times a day (QID) | ORAL | Status: DC | PRN

## 2024-01-01 MED ORDER — BUDESONIDE 0.25 MG/2ML IN SUSP: 0.2500 mg | Freq: Two times a day (BID) | RESPIRATORY_TRACT | Status: DC | PRN

## 2024-01-01 MED ORDER — METHIMAZOLE 5 MG PO TABS
5.0000 mg | ORAL_TABLET | ORAL | Status: DC
  Administered 2024-01-01 – 2024-01-02 (×2): 5 mg via ORAL
  Filled 2024-01-01 (×2): qty 1

## 2024-01-01 MED ORDER — IOHEXOL 350 MG/ML SOLN
100.0000 mL | Freq: Once | INTRAVENOUS | Status: AC | PRN
  Administered 2024-01-01: 100 mL via INTRAVENOUS

## 2024-01-01 MED ORDER — METOPROLOL SUCCINATE ER 50 MG PO TB24
100.0000 mg | ORAL_TABLET | Freq: Every day | ORAL | Status: DC
  Administered 2024-01-01: 100 mg via ORAL
  Filled 2024-01-01: qty 2
  Filled 2024-01-01: qty 4

## 2024-01-01 MED ORDER — ALBUTEROL-BUDESONIDE 90-80 MCG/ACT IN AERO: 2.0000 | INHALATION_SPRAY | Freq: Two times a day (BID) | RESPIRATORY_TRACT | Status: DC

## 2024-01-01 MED ORDER — GABAPENTIN 300 MG PO CAPS
600.0000 mg | ORAL_CAPSULE | Freq: Every day | ORAL | Status: DC
  Administered 2024-01-01: 600 mg via ORAL
  Filled 2024-01-01: qty 2

## 2024-01-01 MED ORDER — FLUOXETINE HCL 20 MG PO CAPS
40.0000 mg | ORAL_CAPSULE | Freq: Every morning | ORAL | Status: DC
  Administered 2024-01-01 – 2024-01-02 (×2): 40 mg via ORAL
  Filled 2024-01-01 (×2): qty 2

## 2024-01-01 MED ORDER — BUPRENORPHINE HCL 450 MCG BU FILM
450.0000 ug | ORAL_FILM | Freq: Two times a day (BID) | BUCCAL | Status: DC
Start: 2024-01-01 — End: 2024-01-02
  Administered 2024-01-01: 450 ug via ORAL
  Filled 2024-01-01: qty 1

## 2024-01-01 MED ORDER — HYDROCODONE-ACETAMINOPHEN 5-325 MG PO TABS
1.0000 | ORAL_TABLET | ORAL | Status: DC | PRN
  Administered 2024-01-01 – 2024-01-02 (×2): 1 via ORAL
  Filled 2024-01-01 (×2): qty 1

## 2024-01-01 MED ORDER — ALBUTEROL SULFATE (2.5 MG/3ML) 0.083% IN NEBU: 2.5000 mg | INHALATION_SOLUTION | Freq: Two times a day (BID) | RESPIRATORY_TRACT | Status: DC | PRN

## 2024-01-01 MED ORDER — SODIUM CHLORIDE 0.9% FLUSH
3.0000 mL | Freq: Two times a day (BID) | INTRAVENOUS | Status: DC
  Administered 2024-01-01 – 2024-01-02 (×3): 3 mL via INTRAVENOUS

## 2024-01-01 NOTE — Hospital Course (Signed)
 EKG done today And on his sinus rhythm at 63 PR 162 QRS of 86 UTC 444

## 2024-01-01 NOTE — H&P (Signed)
 History and Physical    Patient: Carmen Armstrong FMW:[id number] DOB: Jun 20, 1965 DOA: 01/01/2024 DOS: the patient was seen and examined on 01/01/2024 . PCP: Waylan Almarie SAUNDERS, MD- Assigned to [facility]. Patient coming from: Home Chief complaint: Chief Complaint  Patient presents with   Wound Check   HPI:  Carmen Armstrong is a 58 y.o. female with past medical history  of   amputation of the right foot great toe and second toe secondary to osteomyelitis in April 2025 s/p great toe amputation and partial second amputation, insulin -dependent DM type II, normocytic anemia, peripheral neuropathy, essential hypertension, paroxysmal atrial fibrillation, chronic diastolic heart failure, CAD, PAD, and hyperlipidemia   Coming today for concerns of infection affecting the third MTP. Also with pain tenderness and a nonhealing ulcer on the third MTP worse over the past 3 days.  No symptoms of headache blurred vision speech gait falls chest pain palpitation nausea vomiting or any other symptoms.  ED Course:  Vital signs in the ED were notable for the following:  Vitals:   01/01/24 0640 01/01/24 0652  BP: (!) 154/70   Pulse: 71   Temp: 97.7 F (36.5 C)   Resp: 20   Weight:  88 kg  SpO2: 99%    >>ED evaluation thus far shows: - BMP is within normal limits. -CBC shows normal white count 6.5 hemoglobin 11.5 platelets 262. -X-ray of the foot shows stable postsurgical amputation changes involving the 1st, 2nd and 5th MTPs, no definite change in mild bone destruction over the third distal phalanx.  MRI ordered and pending.  >>While in the ED patient received the following: Medications  cefTRIAXone  (ROCEPHIN ) 2 g in sodium chloride  0.9 % 100 mL IVPB (has no administration in time range)    And  metroNIDAZOLE  (FLAGYL ) IVPB 500 mg (has no administration in time range)    And  vancomycin  (VANCOCIN ) IVPB 1000 mg/200 mL premix (has no administration in time range)  morphine  (PF) 4 MG/ML injection  4 mg (has no administration in time range)  ondansetron  (ZOFRAN ) injection 4 mg (has no administration in time range)   ROS Past Medical History:  Diagnosis Date   Arrhythmia    Arthritis    Atrial fibrillation (HCC)    CAD (coronary artery disease)    Chronic lower back pain    Depression    Diabetes mellitus without complication (HCC)    Eczema    GAD (generalized anxiety disorder)    GERD (gastroesophageal reflux disease)    H/O vitamin D deficiency    Heart failure (HCC)    Hyperlipidemia    Hypertension    Hypothyroidism    Insomnia    Peripheral neuropathy    PVD (peripheral vascular disease)    Past Surgical History:  Procedure Laterality Date   ABDOMINAL AORTOGRAM W/LOWER EXTREMITY Bilateral 05/21/2023   Procedure: ABDOMINAL AORTOGRAM W/LOWER EXTREMITY;  Surgeon: Pearline Norman RAMAN, MD;  Location: [facility];  Service: Cardiovascular;  Laterality: Bilateral;   AMPUTATION Right 05/22/2023   Procedure: AMPUTATION, FOOT, RAY;  Surgeon: Malvin Marsa FALCON, DPM;  Location: [facility] OR;  Service: Orthopedics/Podiatry;  Laterality: Right;  Left second toe partial amp   AMPUTATION TOE Left 05/22/2023   Procedure: AMPUTATION, TOE;  Surgeon: Malvin Marsa FALCON, DPM;  Location: [facility] OR;  Service: Orthopedics/Podiatry;  Laterality: Left;  Right great toe partial amputation   AMPUTATION TOE Right 08/08/2023   Procedure: AMPUTATION, TOE;  Surgeon: Malvin Marsa FALCON, DPM;  Location: [facility];  Service: Orthopedics/Podiatry;  Laterality:  Right;  Right 2nd toe, possible revision right great toe amputation   ANGIOPLASTY     legs   BACK SURGERY     5 different times   BREAST BIOPSY Right 07/24/2022   MM RT BREAST BX W LOC DEV 1ST LESION IMAGE BX SPEC STEREO GUIDE 07/24/2022 [facility]   CARDIAC CATHETERIZATION  2020   REPLACEMENT TOTAL HIP W/  RESURFACING IMPLANTS Right 2018   RIGHT AND LEFT HEART CATH     ROTATOR CUFF REPAIR Right 2016   SPINAL CORD STIMULATOR  INSERTION N/A 05/08/2022   Procedure: PLACEMENT OF SPINAL CORD STIMULATOR;  Surgeon: Burnetta Aures, MD;  Location: [facility] OR;  Service: Orthopedics;  Laterality: N/A;   SPINE SURGERY  2014,2016,2017,2019   toe amputated  09/2018   5th left toe   TOE AMPUTATION Left    great toe on left side    reports that she has been smoking cigarettes. She has a 15 pack-year smoking history. She has never used smokeless tobacco. She reports that she does not currently use alcohol. She reports that she does not currently use drugs. Allergies  Allergen Reactions   Other Shortness Of Breath    Hazelnuts   Corylus Other (See Comments)    Unknown    Pear Other (See Comments)    welts   Penicillin G Other (See Comments)    Whelps - childhood allergy   Trulicity [Dulaglutide] Other (See Comments)    Large itchy bumps   Family History  Problem Relation Age of Onset   Hypertension Mother    Hyperlipidemia Mother    Diabetes Mother    Stroke Mother    Clotting disorder Father    Hypertension Father    Hyperlipidemia Father    Hyperlipidemia Sister    Diabetes Brother    Neuropathy Brother    Prior to Admission medications   Medication Sig Start Date End Date Taking? Authorizing Provider  AIRSUPRA 90-80 MCG/ACT AERO Inhale 2 puffs into the lungs in the morning and at bedtime. 04/23/23   [provider]  amLODipine  (NORVASC ) 10 MG tablet TAKE 1 TABLET BY MOUTH DAILY *EMERGENCY REFILL* 10/08/23   Tolia, Sunit, DO  atorvastatin  (LIPITOR) 40 MG tablet Take 40 mg by mouth at bedtime. 08/08/19   [provider]  BELBUCA  300 MCG FILM Take by mouth 2 (two) times daily. 07/30/23   [provider]  buPROPion  (WELLBUTRIN  XL) 300 MG 24 hr tablet Take 300 mg by mouth every morning. 04/03/22   [provider]  candesartan (ATACAND) 4 MG tablet Take 4 mg by mouth daily. 08/13/19   [provider]  colchicine 0.6 MG tablet Take 0.6-1.2 mg by mouth See admin instructions. Take  1.2 mg for the first initial dose then 0.6 mg daily. 05/14/23   [provider]  Continuous Glucose Sensor (DEXCOM G7 SENSOR) MISC SMARTSIG:Every Other Day 08/08/23   [provider]  doxycycline  (VIBRA -TABS) 100 MG tablet Take 1 tablet (100 mg total) by mouth 2 (two) times daily for 14 days. 12/20/23 01/03/24  Joya Stabs, DPM  ELIQUIS  5 MG TABS tablet Take 1 tablet (5 mg total) by mouth 2 (two) times daily. 05/02/23   Tolia, Sunit, DO  FLUoxetine  (PROZAC ) 40 MG capsule Take 40 mg by mouth every morning. 08/21/23   [provider]  fluticasone -salmeterol (ADVAIR) 250-50 MCG/ACT AEPB Inhale 1 puff into the lungs 2 (two) times daily. 05/04/23   [provider]  gabapentin  (NEURONTIN ) 600 MG tablet Take 600  mg by mouth at bedtime. 08/08/23   [provider]  isosorbide  mononitrate (IMDUR ) 60 MG 24 hr tablet TAKE 1 TABLET BY MOUTH EVERY MORNING 10/08/23   Tolia, Sunit, DO  LINZESS  145 MCG CAPS capsule Take 145 mcg by mouth daily. 04/03/22   [provider]  methimazole  (TAPAZOLE ) 5 MG tablet Take 1 tablet (5 mg total) by mouth as directed. 1 tablet Monday through Saturday, none on Sundays 03/20/23   Shamleffer, Ibtehal Jaralla, MD  metoprolol  succinate (TOPROL -XL) 100 MG 24 hr tablet Take 100 mg by mouth daily. 06/12/19   [provider]  naloxone  (NARCAN ) nasal spray 4 mg/0.1 mL Place 1 spray into the nose as needed (OD).    [provider]  omeprazole (PRILOSEC) 20 MG capsule Take 20 mg by mouth daily. 06/15/21   [provider]  pregabalin  (LYRICA ) 100 MG capsule Take 1 capsule (100 mg total) by mouth 3 (three) times daily. 07/03/23 12/31/23  Standiford, Marsa FALCON, DPM  tirzepatide  (MOUNJARO ) 12.5 MG/0.5ML Pen Inject 12.5 mg into the skin once a week. 07/31/23   Shamleffer, Ibtehal Jaralla, MD  traMADol  (ULTRAM ) 50 MG tablet Take 50 mg by mouth every 8 (eight) hours as needed. 07/26/23   [provider]  TRESIBA  FLEXTOUCH  200 UNIT/ML FlexTouch Pen INJECT 54 UNITS SUBCUTANEOUSLY DAILY. 11/29/23   Shamleffer, Ibtehal Jaralla, MD  XIGDUO  XR 11-998 MG TB24 Take 2 tablets by mouth daily. 05/01/23   [provider]  zolpidem  (AMBIEN ) 5 MG tablet Take 5 mg by mouth at bedtime. 08/14/23   [provider]                                                                                 Vitals:   01/01/24 0640 01/01/24 0652  BP: (!) 154/70   Pulse: 71   Resp: 20   Temp: 97.7 F (36.5 C)   SpO2: 99%   Weight:  88 kg   Physical Exam Vitals reviewed.  Constitutional:      General: She is not in acute distress.    Appearance: She is not ill-appearing.  HENT:     Head: Normocephalic.  Eyes:     Extraocular Movements: Extraocular movements intact.  Cardiovascular:     Rate and Rhythm: Normal rate and regular rhythm.     Pulses:          Dorsalis pedis pulses are 1+ on the right side and 1+ on the left side.       Posterior tibial pulses are 1+ on the right side and 1+ on the left side.     Heart sounds: Normal heart sounds.  Pulmonary:     Breath sounds: Normal breath sounds.  Abdominal:     General: There is no distension.     Palpations: Abdomen is soft.     Tenderness: There is no abdominal tenderness.  Neurological:     General: No focal deficit present.     Mental Status: She is alert and oriented to person, place, and time.     Labs on Admission: I have personally reviewed following labs and imaging studies CBC: Recent Labs  Lab 01/01/24 0659  WBC 6.5  NEUTROABS 3.1  HGB 11.5*  HCT 37.8  MCV 83.1  PLT 262   Basic Metabolic Panel: Recent Labs  Lab 01/01/24 0659  NA 137  K 3.8  CL 101  CO2 24  GLUCOSE 92  BUN 10  CREATININE 0.69  CALCIUM  9.0   GFR: Estimated Creatinine Clearance: 89.5 mL/min (by C-G formula based on SCr of 0.69 mg/dL). Liver Function Tests: No results for input(s): AST, ALT, ALKPHOS, BILITOT, PROT, ALBUMIN in the last 168 hours. No  results for input(s): LIPASE, AMYLASE in the last 168 hours. No results for input(s): AMMONIA in the last 168 hours. Recent Labs    05/16/23 0508 05/17/23 0557 05/19/23 0535 05/21/23 0513 05/22/23 0442 07/12/23 1351 08/07/23 1730 08/08/23 0441 08/09/23 0234 01/01/24 0659  BUN 9 8 5* 5* 6 5* 8 7 8 10   CREATININE 0.81 0.75 0.65 0.69 0.75 0.78 0.91 0.81 0.85 0.69    Cardiac Enzymes: No results for input(s): CKTOTAL, CKMB, CKMBINDEX, TROPONINI in the last 168 hours. BNP (last 3 results) No results for input(s): PROBNP in the last 8760 hours. HbA1C: No results for input(s): HGBA1C in the last 72 hours. CBG: No results for input(s): GLUCAP in the last 168 hours. Lipid Profile: No results for input(s): CHOL, HDL, LDLCALC, TRIG, CHOLHDL, LDLDIRECT in the last 72 hours. Thyroid  Function Tests: No results for input(s): TSH, T4TOTAL, FREET4, T3FREE, THYROIDAB in the last 72 hours. Anemia Panel: No results for input(s): VITAMINB12, FOLATE, FERRITIN, TIBC, IRON, RETICCTPCT in the last 72 hours. Urine analysis:    Component Value Date/Time   COLORURINE YELLOW 05/15/2023 2212   APPEARANCEUR HAZY (A) 05/15/2023 2212   LABSPEC 1.013 05/15/2023 2212   PHURINE 5.0 05/15/2023 2212   GLUCOSEU >=500 (A) 05/15/2023 2212   HGBUR NEGATIVE 05/15/2023 2212   BILIRUBINUR NEGATIVE 05/15/2023 2212   KETONESUR NEGATIVE 05/15/2023 2212   PROTEINUR NEGATIVE 05/15/2023 2212   NITRITE NEGATIVE 05/15/2023 2212   LEUKOCYTESUR TRACE (A) 05/15/2023 2212   Radiological Exams on Admission: DG Foot Complete Left Result Date: 01/01/2024 CLINICAL DATA:  Third toe infection/wound. Two weeks oral antibiotics. EXAM: LEFT FOOT - COMPLETE 3+ VIEW COMPARISON:  12/20/2023 FINDINGS: External bandage overlying the distal third toe obscuring bony detail. No definite change in mild bone destruction over the third distal phalanx. Other stable postsurgical amputation  changes involving the first, second and fifth toes. Mild degenerate change over the midfoot and hindfoot. No air within the soft tissues. IMPRESSION: 1. No definite change in mild bone destruction over the third distal phalanx. No air within the soft tissues. 2. Stable postsurgical amputation changes involving the first, second and fifth toes. Electronically Signed   By: Toribio Agreste M.D.   On: 01/01/2024 08:26   Data Reviewed: Relevant notes from primary care and specialist visits, past discharge summaries as available in EHR, including Care Everywhere . Prior diagnostic testing as pertinent to current admission diagnoses, Updated medications and problem lists for reconciliation .ED course, including vitals, labs, imaging, treatment and response to treatment,Triage notes, nursing and pharmacy notes and ED provider's notes.Notable results as noted in HPI.Discussed case with EDMD/ ED APP/ or Specialty MD on call and as needed.  Assessment & Plan  58 year old female currently smoker history of CAD PAD amputation left foot digits in the past presenting with pain tenderness and a nonhealing ulcer on the third MTP worse over the past 3 days.  >> Diabetic 3rd  infection: Patient presenting with third MTP diabetic foot infection.        >>  PAF: Currently patient is on Toprol  and Eliquis .   >> Diabetes mellitus type 2 insulin -dependent controlled: Glycemic protocol Premeal 3 times daily and as needed. Hold PTA meds which includes Tresiba / Xigduo .   >> Chronic HFpEF: Patient is currently euvolemic, strict I's and O's, daily weights, as needed diuretic as deemed appropriate. Currently will continue patient on metoprolol , and candesartan hold Imdur .   >> History of CAD/PAD/hyperlipidemia: Continue patient on atorvastatin . Hold Eliquis  for procedure. Continue metoprolol  100 mg daily. Patient has 1+ palpable pulses in both foot and with her recurrence of wound and poor healing will proceed with  CTA aorta runoff.   >> Hyperthyroidism: Secondary to Graves' disease currently patient is on methimazole .   >> Chronic pain syndrome: Will continue patient on Belbuca  300 mcg twice daily and tramadol  as needed.   >>Tobacco abuse: Nicotine  patch.   DVT prophylaxis:  SCD'S Consults:  Podiatry  Advance Care Planning:    Code Status: Full Code   Family Communication:  None  Disposition Plan:  Home.  Severity of Illness: The appropriate patient status for this patient is INPATIENT. Inpatient status is judged to be reasonable and necessary in order to provide the required intensity of service to ensure the patient's safety. The patient's presenting symptoms, physical exam findings, and initial radiographic and laboratory data in the context of their chronic comorbidities is felt to place them at high risk for further clinical deterioration. Furthermore, it is not anticipated that the patient will be medically stable for discharge from the hospital within 2 midnights of admission.   * I certify that at the point of admission it is my clinical judgment that the patient will require inpatient hospital care spanning beyond 2 midnights from the point of admission due to high intensity of service, high risk for further deterioration and high frequency of surveillance required.*  Unresulted Labs (From admission, onward)     Start     Ordered   01/02/24 0500  Comprehensive metabolic panel  Tomorrow morning,   R        01/01/24 1310   01/02/24 0500  CBC  Tomorrow morning,   R        01/01/24 1310   01/02/24 0500  Magnesium  Tomorrow morning,   R        01/01/24 1315   01/01/24 1308  Sedimentation rate  Once,   R        01/01/24 1310   01/01/24 1308  C-reactive protein  Once,   R        01/01/24 1310   01/01/24 1308  Prealbumin  Once,   R        01/01/24 1310   01/01/24 1232  Blood Cultures x 2 sites  BLOOD CULTURE X 2,   STAT      01/01/24 1231            Meds ordered this  encounter  Medications   AND Linked Order Group    cefTRIAXone  (ROCEPHIN ) 2 g in sodium chloride  0.9 % 100 mL IVPB     Antibiotic Indication::   Other Indication (list below)     Other Indication::   Wound infection    metroNIDAZOLE  (FLAGYL ) IVPB 500 mg     Antibiotic Indication::   Other Indication (list below)     Other Indication::   Wound infection    vancomycin  (VANCOCIN ) IVPB 1000 mg/200 mL premix     Indication::   Wound Infection   morphine  (PF) 4 MG/ML  injection 4 mg   ondansetron  (ZOFRAN ) injection 4 mg   insulin  aspart (novoLOG ) injection 0-15 Units    Correction coverage::   Moderate (average weight, post-op)    CBG < 70::   implement hypoglycemia protocol    CBG 70 - 120::   0 units    CBG 121 - 150::   2 units    CBG 151 - 200::   3 units    CBG 201 - 250::   5 units    CBG 251 - 300::   8 units    CBG 301 - 350::   11 units    CBG 351 - 400::   15 units    CBG > 400:   call MD and obtain STAT lab verification   sodium chloride  flush (NS) 0.9 % injection 3 mL   0.9 %  sodium chloride  infusion   OR Linked Order Group    acetaminophen  (TYLENOL ) tablet 650 mg    acetaminophen  (TYLENOL ) suppository 650 mg   HYDROcodone-acetaminophen  (NORCO/VICODIN) 5-325 MG per tablet 1 tablet    Refill:  0   morphine  (PF) 2 MG/ML injection 2 mg   nicotine  (NICODERM CQ  - dosed in mg/24 hours) patch 21 mg   Albuterol -Budesonide  90-80 MCG/ACT AERO 2 puff   atorvastatin  (LIPITOR) tablet 40 mg   Buprenorphine  HCl FILM   buPROPion  (WELLBUTRIN  XL) 24 hr tablet 300 mg   irbesartan  (AVAPRO ) tablet 37.5 mg   FLUoxetine  (PROZAC ) capsule 40 mg   budesonide -formoterol (SYMBICORT) 160-4.5 MCG/ACT inhaler 2 puff   gabapentin  (NEURONTIN ) tablet 600 mg   methimazole  (TAPAZOLE ) tablet 5 mg   metoprolol  succinate (TOPROL -XL) 24 hr tablet 100 mg   cefTRIAXone  (ROCEPHIN ) 2 g in sodium chloride  0.9 % 100 mL IVPB    Antibiotic Indication::   Other Indication (list below)    Other Indication::   DFU    metroNIDAZOLE  (FLAGYL ) IVPB 500 mg    Antibiotic Indication::   Other Indication (list below)    Other Indication::   DFU     Orders Placed This Encounter  Procedures   Procedural/ Surgical Case Request: AMPUTATION, TOE   Blood Cultures x 2 sites   DG Foot Complete Left   CT Angio Aortobifemoral W and/or Wo Contrast   CBC with Differential   Basic metabolic panel   Sedimentation rate   C-reactive protein   Prealbumin   Comprehensive metabolic panel   CBC   Magnesium   Diet Carb Modified Fluid consistency: Thin; Room service appropriate? Yes   Diet NPO time specified   Initiate Carrier Fluid Protocol   Informed Consent Details: Physician/Practitioner Attestation; Transcribe to consent form and obtain patient signature   Apply Diabetes Mellitus Care Plan   STAT CBG when hypoglycemia is suspected. If treated, recheck every 15 minutes after each treatment until CBG >/= 70 mg/dl   Refer to Hypoglycemia Protocol Sidebar Report for treatment of CBG < 70 mg/dl   No HS correction Insulin    Nurse to provide smoking / tobacco cessation education   Maintain IV access   Vital signs   Notify physician (specify)   Refer to Sidebar Report Mobility Protocol for Adult Inpatient   Initiate Adult Central Line Maintenance and Catheter Clearance Protocol for patients with central line (CVC, PICC, Port, Hemodialysis, Trialysis)   Daily weights   Intake and Output   Initiate CHG Protocol for patients in ICU/SD or any patient with a central line or foley catheter   Do not place and if  present remove PureWick   Initiate Oral Care Protocol   Initiate Carrier Fluid Protocol   RN may order General Admission PRN Orders utilizing General Admission PRN medications (through manage orders) for the following patient needs: allergy symptoms (Claritin), cold sores (Carmex), cough (Robitussin DM), eye irritation (Liquifilm Tears), hemorrhoids (Tucks), indigestion (Maalox), minor skin irritation (Hydrocortisone  Cream), muscle pain Lucienne Gay), nose irritation (saline nasal spray) and sore throat (Chloraseptic spray).   SCDs   Cardiac Monitoring Continuous x 48 hours Indications for use: Other; Other indications for use: CAD   Ambulate with assistance   Full code   Consult to hospitalist  Patient needs to stay at [facility] for amputation of toe   Consult to Registered Dietitian   Consult to Transition of Care Team   Consult to Peripheral Vascular Navigator   vancomycin  per pharmacy consult   Pulse oximetry check with vital signs   Oxygen therapy Mode or (Route): Nasal cannula; Liters Per Minute: 2; Keep O2 saturation between: greater than 92 %   I-Stat Lactic Acid, ED   EKG 12-Lead   Admit to Inpatient (patient's expected length of stay will be greater than 2 midnights or inpatient only procedure)   Aspiration precautions   Fall precautions    Author: Mario LULLA Blanch, MD 12 pm- 8 pm. [facility]. 01/01/2024 1:17 PM Please note for any communication after hours contact [facility] Assigned provider on call on Amion.

## 2024-01-01 NOTE — Progress Notes (Deleted)
 Name: Carmen Armstrong  MRN/ DOB: [id number], [birth year]    Age/ Sex: 58 y.o., female     PCP: Waylan Almarie SAUNDERS, MD   Reason for Endocrinology Evaluation: Hyperthyroidism     Initial Endocrinology Clinic Visit: 11/05/2019    PATIENT IDENTIFIER: [name] is a 58 y.o., female with a past medical history of HTN, T2DM, PAF , CHF and dyslipidemia. She has followed with [facility] since 11/05/2019 for consultative assistance with management of her Hyperthyroidism.      HISTORICAL SUMMARY:   She was diagnosed with hyperthyroidism in 10/2018 during routine workup . Two months prior to her presentation she was noted with dry eyes. Had occasional double vision, and is under the care of an ophthalmologist.     Pt on Multaq  Since 2020. S/P cardiac ablation  . Has not been on amiodarone since 2021  Thyroid  ultrasound did not reveal any thyroid  nodules 12/2020 TRAb elevated 30.55 (2021)  Methimazole  started 10/2019   Paternal grandmother with thyroid  disease     DM HISTORY: She has been diagnosed with DM > 20 yrs ago.  She is intolerant to Trulicity.  Her A1c has ranged from 8.2% in 2022, peaking at 9.0% in 08/2022  I have assumed her diabetes management 08/2022 per her request  On her initial visit with me we switched Ozempic to Mounjaro .  Continued basal insulin , and adjusted Xigduo   She was prescribed Humalog  to pain management clinic 2024  SUBJECTIVE:   During the last visit (07/31/2023): A1c 6.7%     Today (01/01/24): Carmen Armstrong is here for follow-up with diabetes management and hyperthyroidism. She checks her blood sugars multiple times daily through Dexcom. The patient has had hypoglycemic episodes since the last clinic visit.    Since her last visit here the patient underwent right second toe amputation in June, 2025, patient follows with podiatry She is s/p spinal cord stimulator placement 04/2022   Patient continues to  follow-up with cardiology for CAD, peripheral artery disease  Denies nausea or vomiting  Has occasional constipation- on linzess  every other day  Denies local neck symptoms  Denies palpitations    HOME DIABETES REGIMEN:  Methimazole  5 mg , 1 tablet Monday through Saturday and none on Sundays Xigduo  06-998 XR, 2 tabs daily Mounjaro  12.5 mg weekly  Tresiba   54 units daily  Novolog  8 units TIDQAC Correction factor : Humalog  (BG-130/30) TIDQAC and QHS   Statin: yes ACE-I/ARB: yes   CONTINUOUS GLUCOSE MONITORING RECORD INTERPRETATION    Dates of Recording: 5/4 - 06/30/2023  Sensor description:dexcom  Results statistics:   CGM use % of time 76  Average and SD 166/54  Time in range 59 %  % Time Above 180 33  % Time above 250 7  % Time Below target 1   Glycemic patterns summary: BG's trend down overnight and fluctuate during the day  Hypoglycemic episodes occurred during the day  Overnight periods: Trends down to optimal    DIABETIC COMPLICATIONS: Microvascular complications:  Neuropathy , 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 07/2023 Denies:  Last Eye Exam: Completed   Macrovascular complications:  CAD, PVD Denies:  CVA       HISTORY:  Past Medical History:  Past Medical History:  Diagnosis Date   Arrhythmia    Arthritis    Atrial fibrillation (HCC)    CAD (coronary artery disease)    Chronic lower back pain    Depression  Diabetes mellitus without complication (HCC)    Eczema    GAD (generalized anxiety disorder)    GERD (gastroesophageal reflux disease)    H/O vitamin D deficiency    Heart failure (HCC)    Hyperlipidemia    Hypertension    Hypothyroidism    Insomnia    Peripheral neuropathy    PVD (peripheral vascular disease)    Past Surgical History:  Past Surgical History:  Procedure Laterality Date   ABDOMINAL AORTOGRAM W/LOWER EXTREMITY Bilateral 05/21/2023   Procedure: ABDOMINAL AORTOGRAM W/LOWER  EXTREMITY;  Surgeon: Pearline Norman RAMAN, MD;  Location: [facility];  Service: Cardiovascular;  Laterality: Bilateral;   AMPUTATION Right 05/22/2023   Procedure: AMPUTATION, FOOT, RAY;  Surgeon: Malvin Marsa FALCON, DPM;  Location: [facility] OR;  Service: Orthopedics/Podiatry;  Laterality: Right;  Left second toe partial amp   AMPUTATION TOE Left 05/22/2023   Procedure: AMPUTATION, TOE;  Surgeon: Malvin Marsa FALCON, DPM;  Location: [facility] OR;  Service: Orthopedics/Podiatry;  Laterality: Left;  Right great toe partial amputation   AMPUTATION TOE Right 08/08/2023   Procedure: AMPUTATION, TOE;  Surgeon: Malvin Marsa FALCON, DPM;  Location: [facility];  Service: Orthopedics/Podiatry;  Laterality: Right;  Right 2nd toe, possible revision right great toe amputation   ANGIOPLASTY     legs   BACK SURGERY     5 different times   BREAST BIOPSY Right 07/24/2022   MM RT BREAST BX W LOC DEV 1ST LESION IMAGE BX SPEC STEREO GUIDE 07/24/2022 [facility]   CARDIAC CATHETERIZATION  2020   REPLACEMENT TOTAL HIP W/  RESURFACING IMPLANTS Right 2018   RIGHT AND LEFT HEART CATH     ROTATOR CUFF REPAIR Right 2016   SPINAL CORD STIMULATOR INSERTION N/A 05/08/2022   Procedure: PLACEMENT OF SPINAL CORD STIMULATOR;  Surgeon: Burnetta Aures, MD;  Location: [facility];  Service: Orthopedics;  Laterality: N/A;   SPINE SURGERY  2014,2016,2017,2019   toe amputated  09/2018   5th left toe   TOE AMPUTATION Left    great toe on left side   Social History:  reports that she has been smoking cigarettes. She has a 15 pack-year smoking history. She has never used smokeless tobacco. She reports that she does not currently use alcohol. She reports that she does not currently use drugs. Family History:  Family History  Problem Relation Age of Onset   Hypertension Mother    Hyperlipidemia Mother    Diabetes Mother    Stroke Mother    Clotting disorder Father    Hypertension Father    Hyperlipidemia Father     Hyperlipidemia Sister    Diabetes Brother    Neuropathy Brother      HOME MEDICATIONS: Allergies as of 01/01/2024       Reactions   Other Shortness Of Breath   Hazelnuts   Corylus Other (See Comments)   Unknown    Pear Other (See Comments)   welts   Penicillin G Other (See Comments)   Whelps - childhood allergy   Trulicity [dulaglutide] Other (See Comments)   Large itchy bumps        Medication List        Accurate as of January 01, 2024  9:18 AM. If you have any questions, ask your nurse or doctor.          Airsupra 90-80 MCG/ACT Aero Generic drug: Albuterol -Budesonide  Inhale 2 puffs into the lungs in the morning and at bedtime.   amLODipine  10 MG tablet Commonly known  as: NORVASC  TAKE 1 TABLET BY MOUTH DAILY *EMERGENCY REFILL*   atorvastatin  40 MG tablet Commonly known as: LIPITOR Take 40 mg by mouth at bedtime.   Belbuca  300 MCG Film Generic drug: Buprenorphine  HCl Take by mouth 2 (two) times daily.   buPROPion  300 MG 24 hr tablet Commonly known as: WELLBUTRIN  XL Take 300 mg by mouth every morning.   candesartan 4 MG tablet Commonly known as: ATACAND Take 4 mg by mouth daily.   colchicine 0.6 MG tablet Take 0.6-1.2 mg by mouth See admin instructions. Take 1.2 mg for the first initial dose then 0.6 mg daily.   Dexcom G7 Sensor Misc SMARTSIG:Every Other Day   doxycycline  100 MG tablet Commonly known as: VIBRA -TABS Take 1 tablet (100 mg total) by mouth 2 (two) times daily for 14 days.   Eliquis  5 MG Tabs tablet Generic drug: apixaban  Take 1 tablet (5 mg total) by mouth 2 (two) times daily.   FLUoxetine  40 MG capsule Commonly known as: PROZAC  Take 40 mg by mouth every morning.   fluticasone -salmeterol 250-50 MCG/ACT Aepb Commonly known as: ADVAIR Inhale 1 puff into the lungs 2 (two) times daily.   gabapentin  600 MG tablet Commonly known as: NEURONTIN  Take 600 mg by mouth at bedtime.   isosorbide  mononitrate 60 MG 24 hr  tablet Commonly known as: IMDUR  TAKE 1 TABLET BY MOUTH EVERY MORNING   Linzess  145 MCG Caps capsule Generic drug: linaclotide  Take 145 mcg by mouth daily.   methimazole  5 MG tablet Commonly known as: TAPAZOLE  Take 1 tablet (5 mg total) by mouth as directed. 1 tablet Monday through Saturday, none on Sundays   metoprolol  succinate 100 MG 24 hr tablet Commonly known as: TOPROL -XL Take 100 mg by mouth daily.   naloxone  4 MG/0.1ML Liqd nasal spray kit Commonly known as: NARCAN  Place 1 spray into the nose as needed (OD).   omeprazole 20 MG capsule Commonly known as: PRILOSEC Take 20 mg by mouth daily.   pregabalin  100 MG capsule Commonly known as: Lyrica  Take 1 capsule (100 mg total) by mouth 3 (three) times daily.   tirzepatide  12.5 MG/0.5ML Pen Commonly known as: MOUNJARO  Inject 12.5 mg into the skin once a week.   traMADol  50 MG tablet Commonly known as: ULTRAM  Take 50 mg by mouth every 8 (eight) hours as needed.   Tresiba  FlexTouch 200 UNIT/ML FlexTouch Pen Generic drug: insulin  degludec INJECT 54 UNITS SUBCUTANEOUSLY DAILY.   Xigduo  XR 11-998 MG Tb24 Generic drug: Dapagliflozin  Pro-metFORMIN  ER Take 2 tablets by mouth daily.   zolpidem  5 MG tablet Commonly known as: AMBIEN  Take 5 mg by mouth at bedtime.          OBJECTIVE:   PHYSICAL EXAM: VS: There were no vitals taken for this visit.    EXAM: General: NAD  Neck: General: Supple without adenopathy. Thyroid : no nodules appreciated   Lungs: Clear with good BS bilat  Heart: Auscultation: RRR.  Mental Status: Judgment, insight: Intact Mood and affect: No depression, anxiety, or agitation   DM Foot Exam 07/10/2023  per podiatry      DATA REVIEWED:   Latest Reference Range & Units 01/01/24 06:59  Sodium 135 - 145 mmol/L 137  Potassium 3.5 - 5.1 mmol/L 3.8  Chloride 98 - 111 mmol/L 101  CO2 22 - 32 mmol/L 24  Glucose 70 - 99 mg/dL 92  BUN 6 - 20 mg/dL 10  Creatinine 9.55 - 8.99 mg/dL 9.30   Calcium  8.9 - 10.3 mg/dL 9.0  Anion gap 5 - 15  12  GFR, Estimated >60 mL/min >60      Results for SAYRA, FRISBY (MRN [id number]) as of 01/26/2020 13:34  Ref. Range 12/17/2019 10:24  TRAB Latest Ref Range: <=2.00 IU/L 30.55 (H)    Thyroid  Ultrasound 01/12/2021  Estimated total number of nodules >/= 1 cm: 0   Number of spongiform nodules >/=  2 cm not described below (TR1): 0   Number of mixed cystic and solid nodules >/= 1.5 cm not described below (TR2): 0   _________________________________________________________   No discrete nodules are seen within the thyroid  gland.   IMPRESSION: 1. Borderline enlarged thyroid  gland without discrete nodule identified.    ASSESSMENT / PLAN / RECOMMENDATIONS:   1) Type 2 Diabetes Mellitus, optimally controlled, With neuropathic and macrovascular complications - Most recent A1c of  6.7%. Goal A1c < 7.0 %.    -A1c has trended down from 13.1% to 6.7% -PCP decrease Tresiba  due to hypoglycemia, no change in basal insulin  at this time -Will increase Mounjaro  as below - I will decrease NovoLog  as below    MEDICATIONS: Continue  Xigduo  06-998 XR, 2 tabs daily Increase Mounjaro  12.5 mg weekly Continue Tresiba  54 units daily Decrease NovoLog  8 units TIDQAC Continue correction factor : NovoLog  (BG-130/30) TIDQAC and QHS    EDUCATION / INSTRUCTIONS: BG monitoring instructions: Patient is instructed to check her  blood sugars 3 times a day. Call [facility] if: BG persistently < 70  I reviewed the Rule of 15 for the treatment of hypoglycemia in detail with the patient. Literature supplied.    2) Diabetic complications:  Eye: Does not have known diabetic retinopathy.  Neuro/ Feet: Does  have known diabetic peripheral neuropathy .  Renal: Patient does not have known baseline CKD. She   is  on an ACEI/ARB at present.    3)Hyperthyroidism Secondary to Graves' Disease:    - Pt is clinically euthyroid  - No  local neck symptoms - TFTs normal 06/13/2023, no change   Medications   Continue methimazole  5 mg , 1 tablet Monday through Saturday and none on Sundays   4). Graves' Disease:   -No extrathyroidal manifestation of Graves' disease    F/U in 5 months     Signed electronically by: Stefano Redgie Butts, MD  [facility]  [facility] [address] [place] [place], [place] [address] Phone: [phone] FAX: [phone]      CC: Waylan Almarie SAUNDERS, MD [address] [place] [address] Phone: [phone]  Fax: [phone]   Return to Endocrinology clinic as below: Future Appointments  Date Time Provider Department Center  01/01/2024 10:50 AM Merrily Tegeler, Donell Redgie, MD [facility] None  01/18/2024 10:00 AM Pearline Norman RAMAN, MD [facility]  03/17/2024 10:00 AM [facility]

## 2024-01-01 NOTE — Anesthesia Preprocedure Evaluation (Signed)
 Anesthesia Evaluation  Patient identified by MRN, date of birth, ID band Patient awake    Reviewed: Allergy & Precautions, NPO status , Patient's Chart, lab work & pertinent test results, reviewed documented beta blocker date and time   History of Anesthesia Complications Negative for: history of anesthetic complications  Airway Mallampati: II  TM Distance: >3 FB Neck ROM: Full    Dental  (+) Edentulous Lower, Edentulous Upper   Pulmonary Current Smoker and Patient abstained from smoking.   Pulmonary exam normal        Cardiovascular hypertension, Pt. on home beta blockers and Pt. on medications + CAD and + Peripheral Vascular Disease  Normal cardiovascular exam+ dysrhythmias Atrial Fibrillation    '24 TTE - EF 65%. Mild concentric hypertrophy of the left ventricle. Grade I (impaired) diastolic dysfunction, normal LAP. No significant valvular abnormality.     Neuro/Psych  PSYCHIATRIC DISORDERS Anxiety Depression     Neuromuscular disease    GI/Hepatic Neg liver ROS,GERD  Medicated and Controlled,,  Endo/Other  diabetes, Insulin  Dependent Hyperthyroidism  On GLP-1a   Renal/GU negative Renal ROS     Musculoskeletal  (+) Arthritis ,    Abdominal   Peds  Hematology negative hematology ROS (+)   Anesthesia Other Findings On buprenorphine    Reproductive/Obstetrics                              Anesthesia Physical Anesthesia Plan  ASA: 3  Anesthesia Plan: MAC   Post-op Pain Management: Tylenol  PO (pre-op)* and Regional block*   Induction:   PONV Risk Score and Plan: 1 and Propofol  infusion and Treatment may vary due to age or medical condition  Airway Management Planned: Natural Airway and Simple Face Mask  Additional Equipment: None  Intra-op Plan:   Post-operative Plan:   Informed Consent: I have reviewed the patients History and Physical, chart, labs and discussed the  procedure including the risks, benefits and alternatives for the proposed anesthesia with the patient or authorized representative who has indicated his/her understanding and acceptance.       Plan Discussed with: CRNA and Anesthesiologist  Anesthesia Plan Comments:          Anesthesia Quick Evaluation

## 2024-01-01 NOTE — ED Provider Notes (Signed)
 [place] EMERGENCY DEPARTMENT AT [facility] Provider Note   CSN: [id number] Arrival date & time: 01/01/24  9362     Patient presents with: Wound Check   Carmen Armstrong is a 58 y.o. female.   58 yo F with a complaint of left third toe pain.  This been an ongoing issue for her.  Sees podiatry for this.  Was seen yesterday in clinic and there was some concern for osteomyelitis and was sent here for IV antibiotics and likely amputation.  She denies fevers or chills.  Denies trauma.   Wound Check       Prior to Admission medications   Medication Sig Start Date End Date Taking? Authorizing Provider  AIRSUPRA 90-80 MCG/ACT AERO Inhale 2 puffs into the lungs in the morning and at bedtime. 04/23/23   [provider]  amLODipine  (NORVASC ) 10 MG tablet TAKE 1 TABLET BY MOUTH DAILY *EMERGENCY REFILL* 10/08/23   Tolia, Sunit, DO  atorvastatin  (LIPITOR) 40 MG tablet Take 40 mg by mouth at bedtime. 08/08/19   [provider]  BELBUCA  300 MCG FILM Take by mouth 2 (two) times daily. 07/30/23   [provider]  buPROPion  (WELLBUTRIN  XL) 300 MG 24 hr tablet Take 300 mg by mouth every morning. 04/03/22   [provider]  candesartan (ATACAND) 4 MG tablet Take 4 mg by mouth daily. 08/13/19   [provider]  colchicine 0.6 MG tablet Take 0.6-1.2 mg by mouth See admin instructions. Take 1.2 mg for the first initial dose then 0.6 mg daily. 05/14/23   [provider]  Continuous Glucose Sensor (DEXCOM G7 SENSOR) MISC SMARTSIG:Every Other Day 08/08/23   [provider]  doxycycline  (VIBRA -TABS) 100 MG tablet Take 1 tablet (100 mg total) by mouth 2 (two) times daily for 14 days. 12/20/23 01/03/24  Joya Stabs, DPM  ELIQUIS  5 MG TABS tablet Take 1 tablet (5 mg total) by mouth 2 (two) times daily. 05/02/23   Tolia, Sunit, DO  FLUoxetine  (PROZAC ) 40 MG capsule Take 40 mg by mouth every morning. 08/21/23   [provider]   fluticasone -salmeterol (ADVAIR) 250-50 MCG/ACT AEPB Inhale 1 puff into the lungs 2 (two) times daily. 05/04/23   [provider]  gabapentin  (NEURONTIN ) 600 MG tablet Take 600 mg by mouth at bedtime. 08/08/23   [provider]  isosorbide  mononitrate (IMDUR ) 60 MG 24 hr tablet TAKE 1 TABLET BY MOUTH EVERY MORNING 10/08/23   Tolia, Sunit, DO  LINZESS  145 MCG CAPS capsule Take 145 mcg by mouth daily. 04/03/22   [provider]  methimazole  (TAPAZOLE ) 5 MG tablet Take 1 tablet (5 mg total) by mouth as directed. 1 tablet Monday through Saturday, none on Sundays 03/20/23   Shamleffer, Ibtehal Jaralla, MD  metoprolol  succinate (TOPROL -XL) 100 MG 24 hr tablet Take 100 mg by mouth daily. 06/12/19   [provider]  naloxone  (NARCAN ) nasal spray 4 mg/0.1 mL Place 1 spray into the nose as needed (OD).    [provider]  omeprazole (PRILOSEC) 20 MG capsule Take 20 mg by mouth daily. 06/15/21   [provider]  pregabalin  (LYRICA ) 100 MG capsule Take 1 capsule (100 mg total) by mouth 3 (three) times daily. 07/03/23 12/31/23  Standiford, Marsa FALCON, DPM  tirzepatide  (MOUNJARO ) 12.5 MG/0.5ML Pen Inject 12.5 mg into the skin once a week. 07/31/23   Shamleffer, Ibtehal Jaralla, MD  traMADol  (ULTRAM ) 50 MG tablet Take 50 mg by mouth every 8 (eight) hours as needed. 07/26/23  [provider]  TRESIBA  FLEXTOUCH 200 UNIT/ML FlexTouch Pen INJECT 54 UNITS SUBCUTANEOUSLY DAILY. 11/29/23   Shamleffer, Ibtehal Jaralla, MD  XIGDUO  XR 11-998 MG TB24 Take 2 tablets by mouth daily. 05/01/23   [provider]  zolpidem  (AMBIEN ) 5 MG tablet Take 5 mg by mouth at bedtime. 08/14/23   [provider]    Allergies: Other, Corylus, Pear, Penicillin g, and Trulicity [dulaglutide]    Review of Systems  Updated Vital Signs BP (!) 154/70 (BP Location: Left Arm)   Pulse 71   Temp 97.7 F (36.5 C)   Resp 20   Wt 88 kg   SpO2 99%   BMI 28.65 kg/m   Physical  Exam Vitals and nursing note reviewed.  Constitutional:      General: She is not in acute distress.    Appearance: She is well-developed. She is not diaphoretic.  HENT:     Head: Normocephalic and atraumatic.  Eyes:     Pupils: Pupils are equal, round, and reactive to light.  Cardiovascular:     Rate and Rhythm: Normal rate and regular rhythm.     Heart sounds: No murmur heard.    No friction rub. No gallop.  Pulmonary:     Effort: Pulmonary effort is normal.     Breath sounds: No wheezing or rales.  Abdominal:     General: There is no distension.     Palpations: Abdomen is soft.     Tenderness: There is no abdominal tenderness.  Musculoskeletal:        General: Swelling and tenderness present.     Cervical back: Normal range of motion and neck supple.     Comments: Left third digit with some skin breakdown.  No obvious palpable bone.  Deformity that looks chronic the toe.  Intact dorsalis pedis pulse.  Foot is mildly warm and edematous.  Skin:    General: Skin is warm and dry.  Neurological:     Mental Status: She is alert and oriented to person, place, and time.  Psychiatric:        Behavior: Behavior normal.     (all labs ordered are listed, but only abnormal results are displayed) Labs Reviewed  CBC WITH DIFFERENTIAL/PLATELET - Abnormal; Notable for the following components:      Result Value   Hemoglobin 11.5 (*)    MCH 25.3 (*)    RDW 15.7 (*)    All other components within normal limits  CULTURE, BLOOD (ROUTINE X 2)  CULTURE, BLOOD (ROUTINE X 2)  BASIC METABOLIC PANEL WITH GFR  I-STAT CG4 LACTIC ACID, ED    EKG: None  Radiology: DG Foot Complete Left Result Date: 01/01/2024 CLINICAL DATA:  Third toe infection/wound. Two weeks oral antibiotics. EXAM: LEFT FOOT - COMPLETE 3+ VIEW COMPARISON:  12/20/2023 FINDINGS: External bandage overlying the distal third toe obscuring bony detail. No definite change in mild bone destruction over the third distal phalanx.  Other stable postsurgical amputation changes involving the first, second and fifth toes. Mild degenerate change over the midfoot and hindfoot. No air within the soft tissues. IMPRESSION: 1. No definite change in mild bone destruction over the third distal phalanx. No air within the soft tissues. 2. Stable postsurgical amputation changes involving the first, second and fifth toes. Electronically Signed   By: Toribio Agreste M.D.   On: 01/01/2024 08:26     Procedures   Medications Ordered in the ED  cefTRIAXone  (ROCEPHIN ) 2 g in sodium chloride  0.9 % 100  mL IVPB (2 g Intravenous New Bag/Given 01/01/24 1250)    And  metroNIDAZOLE  (FLAGYL ) IVPB 500 mg (has no administration in time range)    And  vancomycin  (VANCOCIN ) IVPB 1000 mg/200 mL premix (has no administration in time range)  morphine  (PF) 4 MG/ML injection 4 mg (4 mg Intravenous Given 01/01/24 1247)  ondansetron  (ZOFRAN ) injection 4 mg (4 mg Intravenous Given 01/01/24 1248)                                    Medical Decision Making Amount and/or Complexity of Data Reviewed Labs: ordered. Radiology: ordered.  Risk Prescription drug management.   58 yo F with a chief complaints of concern for infection to the left third toe.  Clinically it does not look as bad as it did yesterday from the picture when I reviewed her record.  I did discuss the case with Dr. Malvin, podiatry.  Felt the patient needed to amputation.  Recommended IV antibiotics and admission.  No leukocytosis.  No significant electrolyte abnormalities.  Plain film of the foot on my independent interpretation without obvious fracture.  The patients results and plan were reviewed and discussed.   Any x-rays performed were independently reviewed by myself.   Differential diagnosis were considered with the presenting HPI.  Medications  cefTRIAXone  (ROCEPHIN ) 2 g in sodium chloride  0.9 % 100 mL IVPB (2 g Intravenous New Bag/Given 01/01/24 1250)    And   metroNIDAZOLE  (FLAGYL ) IVPB 500 mg (has no administration in time range)    And  vancomycin  (VANCOCIN ) IVPB 1000 mg/200 mL premix (has no administration in time range)  morphine  (PF) 4 MG/ML injection 4 mg (4 mg Intravenous Given 01/01/24 1247)  ondansetron  (ZOFRAN ) injection 4 mg (4 mg Intravenous Given 01/01/24 1248)    Vitals:   01/01/24 0640 01/01/24 0652  BP: (!) 154/70   Pulse: 71   Resp: 20   Temp: 97.7 F (36.5 C)   SpO2: 99%   Weight:  88 kg    Final diagnoses:  Diabetic foot infection (HCC)    Admission/ observation were discussed with the admitting physician, patient and/or family and they are comfortable with the plan.       Final diagnoses:  Diabetic foot infection [facility])    ED Discharge Orders     None          Emil Share, DO 01/01/24 1252

## 2024-01-01 NOTE — Progress Notes (Signed)
 Pharmacy Antibiotic Note  Carmen Armstrong is a 58 y.o. female admitted on 01/01/2024 presenting with worsening foot infection, concern for osteomyelitis.  Pharmacy has been consulted for vancomycin  dosing.  Plan: Vancomycin  1750 mg IV x 1, then 750 mg IV q 8h (eAUC 506) Monitor renal function, Cx and post-op (source control) recommendations to narrow Vancomycin  levels as indicated    Weight: 88 kg (194 lb)  Temp (24hrs), Avg:97.8 F (36.6 C), Min:97.7 F (36.5 C), Max:97.8 F (36.6 C)  Recent Labs  Lab 01/01/24 0659 01/01/24 1248  WBC 6.5  --   CREATININE 0.69  --   LATICACIDVEN  --  1.2    Estimated Creatinine Clearance: 89.5 mL/min (by C-G formula based on SCr of 0.69 mg/dL).    Allergies  Allergen Reactions   Other Shortness Of Breath    Hazelnuts   Corylus Other (See Comments)    Unknown    Pear Other (See Comments)    welts   Penicillin G Other (See Comments)    Whelps - childhood allergy   Trulicity [Dulaglutide] Other (See Comments)    Large itchy bumps    Dorn Poot, PharmD, [facility] Clinical Pharmacist ED Pharmacist Phone # [phone] 01/01/2024 1:41 PM

## 2024-01-01 NOTE — Consult Note (Signed)
 PODIATRY CONSULTATION  NAME Carmen Armstrong MRN [id number] DOB 01-Feb-1966 DOA 01/01/2024   Reason for consult:  Chief Complaint  Patient presents with   Wound Check    Attending/Consulting physician: Dr. Emil  History of present illness: 58 y.o. female with history of PAD and DM2 and prior partial 1st and 2nd toe amputations of the left foot presenting with worsening gangrene of the left third toe.  She was seen yesterday by Dr. Joya in the office and sent in for evaluation for amputation of the toe.  I discussed with the patient the recommendation for partial or complete left third toe amputation and she is in agreement.  She has healed that her other amputation of the left foot well and appears to have adequate flow for healing at this time.  Discussed plan for amputation tomorrow.  She is in agreement.  Past Medical History:  Diagnosis Date   Arrhythmia    Arthritis    Atrial fibrillation (HCC)    CAD (coronary artery disease)    Chronic lower back pain    Depression    Diabetes mellitus without complication (HCC)    Eczema    GAD (generalized anxiety disorder)    GERD (gastroesophageal reflux disease)    H/O vitamin D deficiency    Heart failure (HCC)    Hyperlipidemia    Hypertension    Hypothyroidism    Insomnia    Peripheral neuropathy    PVD (peripheral vascular disease)        Latest Ref Rng & Units 01/01/2024    6:59 AM 08/09/2023    2:34 AM 08/08/2023    4:41 AM  CBC  WBC 4.0 - 10.5 K/uL 6.5  6.5  6.5   Hemoglobin 12.0 - 15.0 g/dL 88.4  89.1  89.6   Hematocrit 36.0 - 46.0 % 37.8  35.7  34.5   Platelets 150 - 400 K/uL 262  164  166        Latest Ref Rng & Units 01/01/2024    6:59 AM 08/09/2023    2:34 AM 08/08/2023    4:41 AM  BMP  Glucose 70 - 99 mg/dL 92  877  898   BUN 6 - 20 mg/dL 10  8  7    Creatinine 0.44 - 1.00 mg/dL 9.30  9.14  9.18   Sodium 135 - 145 mmol/L 137  138  138   Potassium 3.5 - 5.1 mmol/L 3.8  4.0  3.8   Chloride 98 -  111 mmol/L 101  105  109   CO2 22 - 32 mmol/L 24  27  25    Calcium  8.9 - 10.3 mg/dL 9.0  8.7  8.5       Physical Exam: Lower Extremity Exam  Left foot with gangrenous changes to the distal two thirds of the left third toe with ulceration present at the distal lateral aspect.  Palpable DP and PT pulse on the left foot  Prior partial 1st and 2nd toe and partial fifth ray amputation left foot  Sensation diminished to light touch         ASSESSMENT/PLAN OF CARE 58 y.o. 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  - N.p.o. past midnight for OR tomorrow for left third toe amputation.  She agrees to proceed - Continue IV abx broad spectrum pending further culture  data - Anticoagulation: Okay to continue what she is on outpatient - Wound care: Betadine paint to the toe preop - WB status: Weightbearing as tolerated - Will continue to follow   Thank you for the consult.  Please contact me directly with any questions or concerns.           Carmen Armstrong, DPM [facility] / [facility]    [address] [place], [place] [address]                Office [phone]  Fax [phone]

## 2024-01-01 NOTE — ED Triage Notes (Signed)
 Pt reports ongoing issues with left third toe infection. Pt has been taking oral abx for the last two weeks. Pt PCP is concerned the infection has gone to the bone. Not redness or swelling to the foot noted. No fevers, nausea or emesis.

## 2024-01-01 NOTE — Progress Notes (Signed)
 Received RN message: Good evening. Tele just called and said pt had 9 beat run of v tach. EKG reviewed and magnesium ordered.  D/W RN to start pt on oxygen per order.  Cont pulse oximetry.

## 2024-01-02 ENCOUNTER — Telehealth (HOSPITAL_COMMUNITY): Payer: Self-pay

## 2024-01-02 ENCOUNTER — Inpatient Hospital Stay (HOSPITAL_COMMUNITY)

## 2024-01-02 ENCOUNTER — Other Ambulatory Visit (HOSPITAL_COMMUNITY): Payer: Self-pay

## 2024-01-02 ENCOUNTER — Inpatient Hospital Stay (HOSPITAL_COMMUNITY): Payer: Self-pay | Admitting: Anesthesiology

## 2024-01-02 ENCOUNTER — Encounter (HOSPITAL_COMMUNITY): Admission: EM | Disposition: A | Payer: Self-pay | Source: Home / Self Care | Attending: Internal Medicine

## 2024-01-02 ENCOUNTER — Encounter (HOSPITAL_COMMUNITY): Payer: Self-pay | Admitting: Internal Medicine

## 2024-01-02 DIAGNOSIS — I2510 Atherosclerotic heart disease of native coronary artery without angina pectoris: Secondary | ICD-10-CM

## 2024-01-02 DIAGNOSIS — E1169 Type 2 diabetes mellitus with other specified complication: Secondary | ICD-10-CM | POA: Diagnosis not present

## 2024-01-02 DIAGNOSIS — M869 Osteomyelitis, unspecified: Secondary | ICD-10-CM | POA: Diagnosis not present

## 2024-01-02 DIAGNOSIS — L089 Local infection of the skin and subcutaneous tissue, unspecified: Secondary | ICD-10-CM | POA: Diagnosis not present

## 2024-01-02 HISTORY — PX: AMPUTATION TOE: SHX6595

## 2024-01-02 LAB — CBC
HCT: 36.8 % (ref 36.0–46.0)
Hemoglobin: 11.5 g/dL — ABNORMAL LOW (ref 12.0–15.0)
MCH: 25.6 pg — ABNORMAL LOW (ref 26.0–34.0)
MCHC: 31.3 g/dL (ref 30.0–36.0)
MCV: 81.8 fL (ref 80.0–100.0)
Platelets: 242 K/uL (ref 150–400)
RBC: 4.5 MIL/uL (ref 3.87–5.11)
RDW: 15.6 % — ABNORMAL HIGH (ref 11.5–15.5)
WBC: 7 K/uL (ref 4.0–10.5)
nRBC: 0 % (ref 0.0–0.2)

## 2024-01-02 LAB — COMPREHENSIVE METABOLIC PANEL WITH GFR
ALT: 10 U/L (ref 0–44)
AST: 12 U/L — ABNORMAL LOW (ref 15–41)
Albumin: 3 g/dL — ABNORMAL LOW (ref 3.5–5.0)
Alkaline Phosphatase: 93 U/L (ref 38–126)
Anion gap: 13 (ref 5–15)
BUN: 12 mg/dL (ref 6–20)
CO2: 25 mmol/L (ref 22–32)
Calcium: 9.4 mg/dL (ref 8.9–10.3)
Chloride: 103 mmol/L (ref 98–111)
Creatinine, Ser: 0.86 mg/dL (ref 0.44–1.00)
GFR, Estimated: >60 mL/min (ref >60)
Glucose, Bld: 57 mg/dL — ABNORMAL LOW (ref 70–99)
Potassium: 4.4 mmol/L (ref 3.5–5.1)
Sodium: 141 mmol/L (ref 135–145)
Total Bilirubin: 0.5 mg/dL (ref 0.0–1.2)
Total Protein: 6.7 g/dL (ref 6.5–8.1)

## 2024-01-02 LAB — GLUCOSE, CAPILLARY
Glucose-Capillary: 80 mg/dL (ref 70–99)
Glucose-Capillary: 126 mg/dL — ABNORMAL HIGH (ref 70–99)
Glucose-Capillary: 66 mg/dL — ABNORMAL LOW (ref 70–99)
Glucose-Capillary: 84 mg/dL (ref 70–99)
Glucose-Capillary: 172 mg/dL — ABNORMAL HIGH (ref 70–99)
Glucose-Capillary: 212 mg/dL — ABNORMAL HIGH (ref 70–99)

## 2024-01-02 LAB — MAGNESIUM: Magnesium: 1.6 mg/dL — ABNORMAL LOW (ref 1.7–2.4)

## 2024-01-02 SURGERY — AMPUTATION, TOE
Anesthesia: Monitor Anesthesia Care | Site: Toe | Laterality: Left

## 2024-01-02 MED ORDER — LIDOCAINE 2% (20 MG/ML) 5 ML SYRINGE
INTRAMUSCULAR | Status: DC | PRN
  Administered 2024-01-02: 60 mg via INTRAVENOUS

## 2024-01-02 MED ORDER — NICOTINE 21 MG/24HR TD PT24
21.0000 mg | MEDICATED_PATCH | Freq: Every day | TRANSDERMAL | 0 refills | Status: AC
  Filled 2024-01-02: qty 28, 28d supply, fill #0

## 2024-01-02 MED ORDER — DEXTROSE 50 % IV SOLN
12.5000 g | INTRAVENOUS | Status: AC
  Administered 2024-01-02: 12.5 g via INTRAVENOUS
  Filled 2024-01-02: qty 50

## 2024-01-02 MED ORDER — MAGNESIUM SULFATE 2 GM/50ML IV SOLN
2.0000 g | Freq: Once | INTRAVENOUS | Status: AC
  Administered 2024-01-02: 2 g via INTRAVENOUS
  Filled 2024-01-02 (×2): qty 50

## 2024-01-02 MED ORDER — BUPIVACAINE HCL (PF) 0.5 % IJ SOLN
INTRAMUSCULAR | Status: AC
  Filled 2024-01-02: qty 10

## 2024-01-02 MED ORDER — INSULIN ASPART 100 UNIT/ML IJ SOLN: 0.0000 [IU] | INTRAMUSCULAR | Status: DC | PRN

## 2024-01-02 MED ORDER — ONDANSETRON HCL 4 MG/2ML IJ SOLN
INTRAMUSCULAR | Status: DC | PRN
  Administered 2024-01-02: 4 mg via INTRAVENOUS

## 2024-01-02 MED ORDER — LIDOCAINE HCL (PF) 1 % IJ SOLN
INTRAMUSCULAR | Status: AC
  Filled 2024-01-02: qty 10

## 2024-01-02 MED ORDER — APIXABAN 5 MG PO TABS: 5.0000 mg | ORAL_TABLET | Freq: Two times a day (BID) | ORAL | Status: DC

## 2024-01-02 MED ORDER — BUPIVACAINE HCL (PF) 0.5 % IJ SOLN
INTRAMUSCULAR | Status: DC | PRN
  Administered 2024-01-02: 2.5 mL

## 2024-01-02 MED ORDER — LIDOCAINE 2% (20 MG/ML) 5 ML SYRINGE
INTRAMUSCULAR | Status: AC
Start: 2024-01-02 — End: 2024-01-02
  Filled 2024-01-02: qty 5

## 2024-01-02 MED ORDER — PHENYLEPHRINE 80 MCG/ML (10ML) SYRINGE FOR IV PUSH (FOR BLOOD PRESSURE SUPPORT)
PREFILLED_SYRINGE | INTRAVENOUS | Status: DC | PRN
  Administered 2024-01-02: 40 ug via INTRAVENOUS

## 2024-01-02 MED ORDER — PROPOFOL 500 MG/50ML IV EMUL
INTRAVENOUS | Status: DC | PRN
  Administered 2024-01-02: 40 mg via INTRAVENOUS
  Administered 2024-01-02: 100 ug/kg/min via INTRAVENOUS

## 2024-01-02 MED ORDER — ONDANSETRON HCL 4 MG/2ML IJ SOLN
INTRAMUSCULAR | Status: AC
  Filled 2024-01-02: qty 2

## 2024-01-02 MED ORDER — LIDOCAINE HCL (PF) 1 % IJ SOLN
INTRAMUSCULAR | Status: DC | PRN
  Administered 2024-01-02: 2.5 mL

## 2024-01-02 MED ORDER — FENTANYL CITRATE (PF) 100 MCG/2ML IJ SOLN
INTRAMUSCULAR | Status: AC
  Filled 2024-01-02: qty 2

## 2024-01-02 MED ORDER — DOXYCYCLINE HYCLATE 100 MG PO TABS
100.0000 mg | ORAL_TABLET | Freq: Two times a day (BID) | ORAL | 0 refills | Status: AC
  Filled 2024-01-02: qty 10, 5d supply, fill #0

## 2024-01-02 MED ORDER — LACTATED RINGERS IV SOLN: INTRAVENOUS | Status: DC

## 2024-01-02 MED ORDER — SODIUM CHLORIDE 0.9 % IR SOLN
Status: DC | PRN
  Administered 2024-01-02: 1000 mL

## 2024-01-02 MED ORDER — MIDAZOLAM HCL 2 MG/2ML IJ SOLN
INTRAMUSCULAR | Status: AC
  Filled 2024-01-02: qty 2

## 2024-01-02 MED ORDER — DEXAMETHASONE SOD PHOSPHATE PF 10 MG/ML IJ SOLN
INTRAMUSCULAR | Status: DC | PRN
  Administered 2024-01-02: 5 mg via INTRAVENOUS

## 2024-01-02 MED ORDER — MIDAZOLAM HCL (PF) 2 MG/2ML IJ SOLN
INTRAMUSCULAR | Status: DC | PRN
  Administered 2024-01-02: 2 mg via INTRAVENOUS

## 2024-01-02 MED ORDER — ORAL CARE MOUTH RINSE: 15.0000 mL | Freq: Once | OROMUCOSAL | Status: AC

## 2024-01-02 MED ORDER — EPHEDRINE SULFATE-NACL 50-0.9 MG/10ML-% IV SOSY
PREFILLED_SYRINGE | INTRAVENOUS | Status: DC | PRN
  Administered 2024-01-02: 5 mg via INTRAVENOUS
  Administered 2024-01-02 (×2): 10 mg via INTRAVENOUS

## 2024-01-02 MED ORDER — PHENYLEPHRINE 80 MCG/ML (10ML) SYRINGE FOR IV PUSH (FOR BLOOD PRESSURE SUPPORT)
PREFILLED_SYRINGE | INTRAVENOUS | Status: AC
  Filled 2024-01-02: qty 10

## 2024-01-02 MED ORDER — ACETAMINOPHEN 325 MG PO TABS
650.0000 mg | ORAL_TABLET | Freq: Four times a day (QID) | ORAL | 0 refills | Status: AC | PRN
  Filled 2024-01-02: qty 20, 3d supply, fill #0

## 2024-01-02 MED ORDER — CHLORHEXIDINE GLUCONATE 0.12 % MT SOLN
15.0000 mL | Freq: Once | OROMUCOSAL | Status: AC
  Administered 2024-01-02: 15 mL via OROMUCOSAL

## 2024-01-02 MED ORDER — DOXYCYCLINE HYCLATE 100 MG PO TABS: 100.0000 mg | ORAL_TABLET | Freq: Two times a day (BID) | ORAL | Status: DC

## 2024-01-02 MED ORDER — FENTANYL CITRATE (PF) 100 MCG/2ML IJ SOLN
INTRAMUSCULAR | Status: DC | PRN
  Administered 2024-01-02: 25 ug via INTRAVENOUS

## 2024-01-02 SURGICAL SUPPLY — 38 items
BLADE AVERAGE 25X9 (BLADE) IMPLANT
BLADE SURG 10 STRL SS (BLADE) ×1 IMPLANT
BLADE SURG 15 STRL LF DISP TIS (BLADE) ×1 IMPLANT
BNDG COHESIVE 3X5 TAN ST LF (GAUZE/BANDAGES/DRESSINGS) ×1 IMPLANT
BNDG COMPR ESMARK 4X3 LF (GAUZE/BANDAGES/DRESSINGS) ×1 IMPLANT
BNDG ELASTIC 3INX 5YD STR LF (GAUZE/BANDAGES/DRESSINGS) ×1 IMPLANT
BNDG ELASTIC 4INX 5YD STR LF (GAUZE/BANDAGES/DRESSINGS) IMPLANT
BNDG GAUZE DERMACEA FLUFF 4 (GAUZE/BANDAGES/DRESSINGS) IMPLANT
BNDG STRETCH 4X75 STRL LF (GAUZE/BANDAGES/DRESSINGS) IMPLANT
CHLORAPREP W/TINT 26 (MISCELLANEOUS) IMPLANT
COVER SURGICAL LIGHT HANDLE (MISCELLANEOUS) IMPLANT
DRSG ADAPTIC 3X8 NADH LF (GAUZE/BANDAGES/DRESSINGS) IMPLANT
DRSG XEROFORM 1X8 (GAUZE/BANDAGES/DRESSINGS) IMPLANT
ELECTRODE REM PT RTRN 9FT ADLT (ELECTROSURGICAL) ×1 IMPLANT
GAUZE PAD ABD 8X10 STRL (GAUZE/BANDAGES/DRESSINGS) IMPLANT
GAUZE SPONGE 2X2 STRL 8-PLY (GAUZE/BANDAGES/DRESSINGS) IMPLANT
GAUZE SPONGE 4X4 12PLY STRL (GAUZE/BANDAGES/DRESSINGS) ×1 IMPLANT
GAUZE STRETCH 2X75IN STRL (MISCELLANEOUS) ×1 IMPLANT
GAUZE XEROFORM 1X8 LF (GAUZE/BANDAGES/DRESSINGS) ×1 IMPLANT
GLOVE BIO SURGEON STRL SZ7.5 (GLOVE) ×1 IMPLANT
GLOVE BIOGEL PI IND STRL 7.5 (GLOVE) ×1 IMPLANT
GOWN STRL REUS W/ TWL LRG LVL3 (GOWN DISPOSABLE) ×2 IMPLANT
KIT BASIN OR (CUSTOM PROCEDURE TRAY) ×1 IMPLANT
NDL HYPO 25X1 1.5 SAFETY (NEEDLE) ×1 IMPLANT
NEEDLE HYPO 25X1 1.5 SAFETY (NEEDLE) ×2 IMPLANT
PACK ORTHO EXTREMITY (CUSTOM PROCEDURE TRAY) ×1 IMPLANT
PADDING CAST ABS COTTON 4X4 ST (CAST SUPPLIES) ×2 IMPLANT
SET HNDPC FAN SPRY TIP SCT (DISPOSABLE) IMPLANT
SOLN STERILE WATER BTL 1000 ML (IV SOLUTION) ×1 IMPLANT
SPIKE FLUID TRANSFER (MISCELLANEOUS) IMPLANT
STOCKINETTE 4X48 STRL (DRAPES) IMPLANT
SUT ETHILON 3 0 FSLX (SUTURE) IMPLANT
SUT PROLENE 3 0 PS 2 (SUTURE) IMPLANT
SUT PROLENE 4 0 PS 2 18 (SUTURE) IMPLANT
SYR CONTROL 10ML LL (SYRINGE) ×1 IMPLANT
TUBE CONNECTING 12X1/4 (SUCTIONS) IMPLANT
UNDERPAD 30X36 HEAVY ABSORB (UNDERPADS AND DIAPERS) ×1 IMPLANT
YANKAUER SUCT BULB TIP NO VENT (SUCTIONS) IMPLANT

## 2024-01-02 NOTE — Progress Notes (Signed)
 Orthopedic Tech Progress Note Patient Details:  Carmen Armstrong [birth year] [id number]  Ortho Devices Type of Ortho Device: Postop shoe/boot Ortho Device/Splint Location: LLE Ortho Device/Splint Interventions: Ordered, Application, Adjustment   Fitted PO-Shoe & left at bedside. Post Interventions Patient Tolerated: Fair Instructions Provided: Adjustment of device, Care of device  Carmen Armstrong F Carmen Armstrong 01/02/2024, 10:29 AM

## 2024-01-02 NOTE — Progress Notes (Signed)
 History and Physical Interval Note:  01/02/2024 8:19 AM  Carmen Armstrong  has presented today for surgery, with the diagnosis of gangrene and osteomyelitis of left third toe.  The various methods of treatment have been discussed with the patient and family. After consideration of risks, benefits and other options for treatment, the patient has consented to   Procedure(s) with comments: AMPUTATION, TOE (Left) - left third toe amputation as a surgical intervention.  The patient's history has been reviewed, patient examined, no change in status, stable for surgery.  I have reviewed the patient's chart and labs.  Questions were answered to the patient's satisfaction.     Marsa FALCON Treysean Petruzzi

## 2024-01-02 NOTE — TOC Transition Note (Signed)
 Transition of Care [facility]) - Discharge Note   Patient Details  Name: Carmen Armstrong MRN: [id number] Date of Birth: 06-Jul-1965  [facility] of [facility]) CM/SW Contact:  Rosalva Jon Bloch, RN Phone Number: 01/02/2024, 9:58 AM   Clinical Narrative:    Patient will DC to: home Anticipated DC date: 01/02/2024 Family notified: yes Transport by: car      -s/p Amputation of third toe on left foot, 11/19 Per MD patient ready for DC later today or tomorrow if stable. RN, patient, and patient's niece notified of DC. Pt with DME( RW, cane and crutches) @ home. Pt states from home with niece.States niece will assist with needed care.  States with transportation issues, will take a Gisele or LYFT for transportation to home. Post hospital f/u noted on AVS. Pt without RX med concerns.    RNCM will sign off for now as intervention is no longer needed. Please consult us  again if new needs arise.   Final next level of care: Home/Self Care Barriers to Discharge: Continued Medical Work up   Patient Goals and CMS Choice            Discharge Placement                       Discharge Plan and Services Additional resources added to the After Visit Summary for                                       Social Drivers of Health (SDOH) Interventions SDOH Screenings   Food Insecurity: No Food Insecurity (01/01/2024)  Housing: Low Risk  (01/01/2024)  Transportation Needs: No Transportation Needs (01/01/2024)  Utilities: Not At Risk (01/01/2024)  Social Connections: Unknown (11/30/2021)   Received from [facility]  Tobacco Use: High Risk (01/02/2024)     Readmission Risk Interventions    08/09/2023    9:31 AM  Readmission Risk Prevention Plan  Transportation Screening Complete  PCP or Specialist Appt within 3-5 Days Complete  HRI or Home Care Consult Complete  Social Work Consult for Recovery Care Planning/Counseling Complete  Palliative Care Screening Not  Applicable  Medication Review Ocean[facility]) Referral to Pharmacy

## 2024-01-02 NOTE — Transfer of Care (Signed)
 Immediate Anesthesia Transfer of Care Note  Patient: Carmen Armstrong  Procedure(s) Performed: AMPUTATION, TOE (Left: Toe)  Patient Location: PACU  Anesthesia Type:MAC  Level of Consciousness: drowsy  Airway & Oxygen Therapy: Patient Spontanous Breathing and Patient connected to nasal cannula oxygen  Post-op Assessment: Report given to RN and Post -op Vital signs reviewed and stable  Post vital signs: Reviewed and stable  Last Vitals:  Vitals Value Taken Time  BP 122/63 01/02/24 09:07  Temp 97.9   Pulse 50 01/02/24 09:08  Resp 14   SpO2 92 % 01/02/24 09:08  Vitals shown include unfiled device data.  Last Pain:  Vitals:   01/02/24 0751  TempSrc:   PainSc: 5          Complications: No notable events documented.

## 2024-01-02 NOTE — Op Note (Addendum)
 Full Operative Report  Date of Operation: 8:22 AM, 01/02/2024   Patient: Carmen Armstrong - 58 y.o. female  Surgeon: Malvin Marsa FALCON, DPM   Assistant: None  Diagnosis: Gangrene and osteomyelitis left third toe  Procedure:  1. Amputation of third toe on left foot at MPJ level    Anesthesia: Monitor Anesthesia Care  No responsible provider has been recorded for the case.  Anesthesiologist: Lucious Debby BRAVO, MD CRNA: Atanacio Arland HERO, CRNA   Estimated Blood Loss: Minimal   Hemostasis: 1) Anatomical dissection, mechanical compression, electrocautery 2) No tourniquet was used   Implants: * No implants in log *  Materials: Prolene 3-0  Injectables: 1) Pre-operatively: 10 cc of 50:50 mixture 1%lidocaine  plain and 0.5% marcaine  plain 2) Post-operatively: None   Specimens: - Pathology: Left third toe - Microbiology: none   Antibiotics: IV antibiotics given per schedule on the floor  Drains: None  Complications: Patient tolerated the procedure well without complication.   Operative findings: As below in detailed report  Indications for Procedure: Carmen Armstrong presents to [employer], Marsa FALCON, DPM with a chief complaint of gangrene and osteomyelitis of the distal 2/3 of the left third toe. The patient has failed conservative treatments of various modalities. At this time the patient has elected to proceed with surgical correction. All alternatives, risks, and complications of the procedures were thoroughly explained to the patient. Patient exhibits appropriate understanding of all discussion points and informed consent was signed and obtained in the chart with no guarantees to surgical outcome given or implied.  Description of Procedure: Patient was brought to the operating room. Patient remained on their hospital bed in the supine position. A surgical timeout was performed and all members of the operating room, the procedure, and the surgical site were  identified. anesthesia occurred as per anesthesia record. Local anesthetic as previously described was then injected about the operative field in a local infiltrative block.  The operative lower extremity as noted above was then prepped and draped in the usual sterile manner. The following procedure then began.  Attention was directed to the third digit on the LEFT foot. A full-thickness incision encompassing the entire digit was made using a #15 blade. Dissection was carried down to bone. The toe was secured with a towel clamp, further dissected in its entirety, and disarticulated at the MPJ and passed to the back table as a gross specimen. This was then labled and sent to pathology. The bone was noted to be soft and eroded, and consistent with osteomyelitis. All remaining necrotic and devitalized soft tissue structures were visualized and dissected away using sharp and dull dissection. Care was taken to protect all neurovascular structures throughout the dissection. All bleeders were cauterized as necessary.  The area was then flushed with copious amounts of sterile saline. Then using the suture materials previously described, the site was closed in anatomic layers and the skin was well approximated under minimal tension.   The surgical site was then dressed with xeroform 4x4 kerlix and ace wrap. The patient tolerated both the procedure and anesthesia well with vital signs stable throughout. The patient was transferred in good condition and all vital signs stable  from the OR to recovery under the discretion of anesthesia.  Condition: Vital signs stable, neurovascular status unchanged from preoperative   Surgical plan:  Expect clean margin, minimal bleeding seen at site of amputation will be monitored for healing progress as outpatient. Ok to switch to doxycyline x 5 days post op. WBAT in post  op shoe. Stable for dc home later today or tomorrow, follow up next week Tuesday for dsg change leave intact  until then  The patient will be WBAT in a post op shoe to the operative limb until further instructed. The dressing is to remain clean, dry, and intact. Will continue to follow unless noted elsewhere.   Marsa Honour, DPM [facility]

## 2024-01-02 NOTE — Anesthesia Postprocedure Evaluation (Signed)
 Anesthesia Post Note  Patient: Carmen Armstrong  Procedure(s) Performed: AMPUTATION, TOE (Left: Toe)     Patient location during evaluation: PACU Anesthesia Type: MAC Level of consciousness: awake and alert Pain management: pain level controlled Vital Signs Assessment: post-procedure vital signs reviewed and stable Respiratory status: spontaneous breathing, nonlabored ventilation and respiratory function stable Cardiovascular status: stable and blood pressure returned to baseline Anesthetic complications: no   No notable events documented.  Last Vitals:  Vitals:   01/02/24 0930 01/02/24 0958  BP: 135/64 124/67  Pulse: (!) 50 (!) 51  Resp: 12 19  Temp:  36.6 C  SpO2: 96% 94%    Last Pain:  Vitals:   01/02/24 0930  TempSrc:   PainSc: 0-No pain                 Debby FORBES Like

## 2024-01-02 NOTE — Progress Notes (Signed)
 PT Cancellation Note  Patient Details Name: Carmen Armstrong MRN: [id number] DOB: [birth year]   Cancelled Treatment:    Reason Eval/Treat Not Completed: PT screened, no needs identified, will sign off. Per discussion with occupational therapy the pt is mobilizing and completing ADLs at a modI level. Pt has necessary DME at home as she walks with a cane at baseline. Pt does not require acute PT evaluation at this time. PT signing off. If mobility concerns arise please re-consult.   [name] 01/02/2024, 4:07 PM

## 2024-01-02 NOTE — Progress Notes (Signed)
 Initial Nutrition Assessment  DOCUMENTATION CODES:   Non-severe (moderate) malnutrition in context of social or environmental circumstances  INTERVENTION:  Continue Carb modified diet Encourage PO intake  Ensure Plus High Protein po BID, each supplement provides 350 kcal and 20 grams of protein. Add MVI w/minerals daily    NUTRITION DIAGNOSIS:   Moderate Malnutrition related to social / environmental circumstances as evidenced by energy intake < 75% for > or equal to 1 month, mild fat depletion, mild muscle depletion, percent weight loss.  GOAL:   Patient will meet greater than or equal to 90% of their needs   MONITOR:   PO intake, Supplement acceptance  REASON FOR ASSESSMENT:   Consult Wound healing  ASSESSMENT:   Past medical history of amputation of the right foot great toe and second toe secondary to osteomyelitis in April 2025 s/p great toe amputation and partial second amputation, insulin -dependent DM type II, normocytic anemia, peripheral neuropathy, essential hypertension, paroxysmal atrial fibrillation, chronic diastolic heart failure, CAD, PAD, and hyperlipidemia    presents for concern of infection affecting the third MTP.  Patient seen in room, lunch just delivered. Pt s/p toe amputation this morning. Pt reports decreased appetite over the past month due to stress/grief of taking care of sick family member, typically has only been eating one meal per day. She reports having a good appetite today and was able to eat all of her breakfast after her procedure this morning. Discussed need for increased protein to support wound healing, pt agree to have ensure added in between meals. No nausea or vomiting reported.    Admit/current weight: 88 kg  Pt reports ~ 5 lb  weight loss over the past month due to decreased appetite/PO intake and usually weighs around 201 lbs. 5% weight loss x 1 month per chart hx, clinically significant   Nutritionally Relevant Medications:  SSI  0-15 units TID    Labs Reviewed: Glu 57, Mg 1.6, CBG 66-126, A1c 6.7 (07/31/23)   NUTRITION - FOCUSED PHYSICAL EXAM:  Flowsheet Row Most Recent Value  Orbital Region No depletion  Upper Arm Region Mild depletion  Thoracic and Lumbar Region No depletion  Buccal Region Mild depletion  Temple Region Mild depletion  Clavicle Bone Region Mild depletion  Clavicle and Acromion Bone Region No depletion  Scapular Bone Region No depletion  Dorsal Hand Mild depletion  Patellar Region No depletion  Anterior Thigh Region No depletion  Posterior Calf Region Mild depletion  Hair Reviewed  Eyes Reviewed  Mouth Reviewed  Skin Reviewed  Nails Reviewed    Diet Order:   Diet Order             Diet Carb Modified Fluid consistency: Thin; Room service appropriate? Yes  Diet effective now           Diet - low sodium heart healthy           Diet Carb Modified                   EDUCATION NEEDS:   Education needs have been addressed  Skin:  Skin Assessment: Skin Integrity Issues: Skin Integrity Issues:: Diabetic Ulcer Diabetic Ulcer: left foot  Last BM:  11/17  Height:   Ht Readings from Last 1 Encounters:  01/02/24 5' 9 (1.753 m)    Weight:   Wt Readings from Last 1 Encounters:  01/02/24 88 kg    Ideal Body Weight:  65.9 kg  BMI:  Body mass index is 28.65 kg/m.  Estimated Nutritional Needs:   Kcal:  8349-8024  Protein:  79-99 g  Fluid:  1.6-1.9L/d  Madalyn Potters, MS, RD, LDN Clinical Dietitian  Contact via secure chat. If unavailable, use group chat RD Inpatient.

## 2024-01-02 NOTE — Discharge Summary (Signed)
 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 *  Hospital Course: 57 y.o. female with history of PAD and DM2 and prior partial 1st and 2nd toe amputations of the left foot presenting with worsening gangrene of the left third toe.  She was seen yesterday by Dr. Joya in the office and sent in for evaluation for amputation of the toe.  I discussed with the patient the recommendation for partial or complete left third toe amputation and she is in agreement.  She has healed that her other amputation of the left foot well and appears to have adequate flow for healing at this time.  Discussed plan for amputation tomorrow.  She is in agreement.   Patient underwent amputation and was cleared for discharge by the podiatry team.  They recommended weightbearing as tolerated in a postoperative shoe until further instructed and keeping the dressing clean dry and intact.  They recommended switching antibiotics to p.o. doxycycline  for 5 days and expect clear margins.  She is medically stable for discharge.  Follow-up with PCP within 1 to 2 weeks and Podiatry within 1 week.  Assessment and Plan:  Diabetic third toe infection: Presented with left third MTP diabetic foot infection.  Podiatry consulted and took the patient for amputation.  They recommended 5 days of antibiotics and weightbearing as tolerated in a postoperative shoe.  Podiatry recommends keeping the dressings clean dry and intact and  follow-up in outpatient setting within 1 to 2 weeks  PAF: C/w Metoprolol  Succinate per discussion with podiatry okay to resume anticoagulation  Diabetes mellitus type 2 insulin -dependent controlled: Glycemic protocol Premeal 3 times daily and as needed. Hold PTA meds which includes Tresiba / Xigduo  but resume @ D/C. CTM Blood Sugars per protocol. CBG Trend:  Recent Labs  Lab 01/01/24 2116 01/02/24 0125 01/02/24 0632 01/02/24 0704 01/02/24 0910 01/02/24 1210 01/02/24 1710  GLUCAP 98 80 66* 126* 84 172* 212*     Chronic HFpEF: Patient is currently euvolemic, strict I's and O's, daily weights, as needed diuretic as deemed appropriate.. Currently will continue patient on metoprolol , and candesartan is on Imdur  at discharge   History of CAD/PAD/hyperlipidemia: Continue patient on atorvastatin . Hed Eliquis  for procedure.  Per podiatry okay to resume Continue metoprolol  100 mg daily. Patient has 1+ palpable pulses in both foot and with her recurrence of wound and poor healing will proceed with CTA aorta runoff and it showed Atherosclerosis of the infrarenal abdominal aorta without aneurysm or stenosis. 2. Atherosclerosis of bilateral common iliac arteries without aneurysm or significant stenosis. 3. Atherosclerosis of bilateral superficial femoral arteries without high-grade stenosis or obstruction. Maximal stenosis approaches 50% at the level of the mid right SFA. 4. Atherosclerosis of bilateral popliteal arteries with maximal stenosis of approximately 50% on the right and 60% on the left. 5. Continuously patent posterior tibial and peroneal arteries bilaterally. The right anterior tibial arteries is small in caliber and occludes in the distal calf. The left anterior tibial artery is patent to  at least the ankle. 6. Probable steatosis of the liver. - Follow-up with vascular outpatient setting   Hyperthyroidism: Secondary to Graves' disease currently patient is on methimazole .    Chronic pain syndrome: Will continue patient on Belbuca  300 mcg twice daily and tramadol  as needed.   Tobacco Abuse: C/w Nicotine  patch.  Overweight: Complicates overall prognosis and care. Estimated body mass index is 28.65 kg/m as calculated from the following:   Height as of this encounter: 5' 9 (1.753 m).   Weight as of this encounter: 88 kg. Weight Loss and Dietary Counseling given  Nutrition Documentation    Flowsheet Row ED to Hosp-Admission (Discharged) from 01/01/2024 in [facility]  Nutrition Problem Moderate Malnutrition  Etiology social / environmental circumstances  Nutrition Goal Patient will meet greater than or equal to 90% of their needs  Interventions MVI, Ensure Enlive (each supplement provides 350kcal and 20 grams of protein)   Consultants: Podiatry  Procedures performed: 3rd Toe Amputation    Disposition: Home Diet recommendation:  Discharge Diet Orders (From admission, onward)     Start     Ordered   01/02/24 0000  Diet - low sodium heart healthy        01/02/24 1525   01/02/24 0000  Diet Carb Modified        01/02/24 1525           Cardiac and Carb modified diet DISCHARGE MEDICATION: Allergies as of 01/02/2024       Reactions   Hazelnut (filbert) Anaphylaxis   Penicillin G Other (See Comments)   Whelps - childhood allergy   Trulicity [dulaglutide] Hives   Large itchy bumps        Medication List     TAKE these medications    acetaminophen  325 MG tablet Commonly known as: TYLENOL  Take 2 tablets (650 mg total) by mouth every 6 (six) hours as needed for mild pain (pain score 1-3), moderate pain (pain score 4-6), fever or headache (or Fever >/= 101).   Airsupra  90-80 MCG/ACT Aero Generic drug: Albuterol -Budesonide  Inhale 2 puffs into the lungs in the morning and at bedtime.   amLODipine  10 MG tablet Commonly known as: NORVASC  TAKE 1 TABLET BY MOUTH DAILY *EMERGENCY REFILL*   atorvastatin  40 MG  tablet Commonly known as: LIPITOR Take 40 mg by mouth at bedtime.   Buprenorphine  HCl 450 MCG Film Take 450 mcg by mouth 2 (two) times daily.   buPROPion  300 MG 24 hr tablet Commonly known as: WELLBUTRIN  XL Take 300 mg by mouth every morning.   candesartan 4 MG tablet Commonly known as: ATACAND Take 4 mg by mouth daily.   colchicine 0.6 MG tablet Take 0.6-1.2 mg by mouth See admin instructions. Take 1.2 mg for the first initial dose then 0.6 mg daily.   Dexcom G7 Sensor Misc Inject 1 Device into the skin once a week.   doxycycline  100 MG tablet Commonly known as: VIBRA -TABS Take 1 tablet (100 mg total) by mouth every 12 (twelve) hours for 5 days. What changed: when to take this   Eliquis  5 MG Tabs tablet Generic drug: apixaban  Take 1 tablet (5 mg total) by mouth 2 (two) times daily.   famotidine 40 MG tablet Commonly known as: PEPCID Take 40 mg by mouth every morning.   FLUoxetine  40 MG capsule Commonly known as: PROZAC  Take 40 mg by mouth every morning.   fluticasone -salmeterol 250-50 MCG/ACT Aepb Commonly known as: ADVAIR Inhale 1 puff into the lungs  2 (two) times daily.   gabapentin  600 MG tablet Commonly known as: NEURONTIN  Take 600 mg by mouth at bedtime.   isosorbide  mononitrate 60 MG 24 hr tablet Commonly known as: IMDUR  TAKE 1 TABLET BY MOUTH EVERY MORNING   Linzess  145 MCG Caps capsule Generic drug: linaclotide  Take 145 mcg by mouth daily.   methimazole  5 MG tablet Commonly known as: TAPAZOLE  Take 1 tablet (5 mg total) by mouth as directed. 1 tablet Monday through Saturday, none on Sundays   metoprolol  succinate 100 MG 24 hr tablet Commonly known as: TOPROL -XL Take 100 mg by mouth daily.   naloxone  4 MG/0.1ML Liqd nasal spray kit Commonly known as: NARCAN  Place 1 spray into the nose as needed (OD).   nicotine  21 mg/24hr patch Commonly known as: NICODERM CQ  - dosed in mg/24 hours Place 1 patch (21 mg total) onto the skin daily.    omeprazole 20 MG capsule Commonly known as: PRILOSEC Take 20 mg by mouth daily.   pregabalin  100 MG capsule Commonly known as: Lyrica  Take 1 capsule (100 mg total) by mouth 3 (three) times daily.   spironolactone  25 MG tablet Commonly known as: ALDACTONE  Take 25 mg by mouth daily.   tirzepatide  12.5 MG/0.5ML Pen Commonly known as: MOUNJARO  Inject 12.5 mg into the skin once a week. What changed: Another medication with the same name was removed. Continue taking this medication, and follow the directions you see here.   traMADol  50 MG tablet Commonly known as: ULTRAM  Take 50 mg by mouth every 8 (eight) hours as needed for moderate pain (pain score 4-6) or severe pain (pain score 7-10).   Tresiba  FlexTouch 200 UNIT/ML FlexTouch Pen Generic drug: insulin  degludec INJECT 54 UNITS SUBCUTANEOUSLY DAILY. What changed: See the new instructions.   Xigduo  XR 11-998 MG Tb24 Generic drug: Dapagliflozin  Pro-metFORMIN  ER Take 1 tablet by mouth in the morning and at bedtime.   zolpidem  6.25 MG CR tablet Commonly known as: AMBIEN  CR Take 6.25 mg by mouth at bedtime.               Discharge Care Instructions  (From admission, onward)           Start     Ordered   01/02/24 0000  Discharge wound care:       Comments: Dressings to remain in place until changed by Podiatry in the outpatient setting   01/02/24 1525            Follow-up Information     Waylan Almarie SAUNDERS, MD Follow up.   Specialty: Family Medicine Contact information: [address] [place] [address] [phone]                Discharge Exam: Filed Weights   01/01/24 0652 01/02/24 0738  Weight: 88 kg 88 kg   Vitals:   01/02/24 0958 01/02/24 1538  BP: 124/67 (!) 144/62  Pulse: (!) 51 64  Resp: 19 16  Temp: 97.9 F (36.6 C) 98 F (36.7 C)  SpO2: 94% 94%   Examination: Physical Exam:  Constitutional: WN/WD overweight AAF in NAD in NAD Respiratory: Diminished to auscultation  bilaterally, no wheezing, rales, rhonchi or crackles. Normal respiratory effort and patient is not tachypenic. No accessory muscle use. Unlabored breathing  Cardiovascular: RRR, no murmurs / rubs / gallops. S1 and S2 auscultated. No extremity edema.  Abdomen: Soft, non-tender, Distended 2/2 body habitus. Bowel sounds positive.  GU: Deferred. Musculoskeletal: No clubbing / cyanosis of digits/nails. Foot is wrapped Skin:  No rashes, lesions, ulcers on a limited skin evaluation. No induration; Warm and dry.  Neurologic: CN 2-12 grossly intact with no focal deficits. Romberg sign and cerebellar reflexes not assessed.  Psychiatric: Normal judgment and insight. Alert and oriented x 3. Normal mood and appropriate affect.   Condition at discharge: stable  The results of significant diagnostics from this hospitalization (including imaging, microbiology, ancillary and laboratory) are listed below for reference.   Imaging Studies: DG Foot 2 Views Left Result Date: 01/02/2024 EXAM: 2 VIEW(S) XRAY OF THE LEFT FOOT 01/02/2024 09:30:00 AM COMPARISON: 01/01/2024 CLINICAL HISTORY: Post-operative state FINDINGS: BONES AND JOINTS: Interval third digit amputation at the level of the MTP joint. Remote amputation of the first and second digits. Remote amputation of the fifth digit at the level of the mid metatarsal. SOFT TISSUES: Diffuse soft tissue swelling of the forefoot. IMPRESSION: 1. Interval third digit amputation at the level of the MTP joint, new since prior exam. 2. Diffuse soft tissue swelling of the forefoot. Electronically signed by: Waddell Calk MD 01/02/2024 01:29 PM EST RP Workstation: [id number]   CT Angio Aortobifemoral W and/or Wo Contrast Result Date: 01/01/2024 CLINICAL DATA:  Diminished pedal pulses and history of diabetes. EXAM: CT ANGIOGRAPHY OF ABDOMINAL AORTA WITH ILIOFEMORAL RUNOFF TECHNIQUE: Multidetector CT imaging of the abdomen, pelvis and lower extremities was performed using the  standard protocol during bolus administration of intravenous contrast. Multiplanar CT image reconstructions and MIPs were obtained to evaluate the vascular anatomy. RADIATION DOSE REDUCTION: This exam was performed according to the departmental dose-optimization program which includes automated exposure control, adjustment of the mA and/or kV according to patient size and/or use of iterative reconstruction technique. CONTRAST:  OMNIPAQUE  IOHEXOL  350 MG/ML SOLN COMPARISON:  None Available. FINDINGS: VASCULAR Aorta: Atherosclerosis of infrarenal abdominal aorta without aneurysm or aortic stenosis. No dissection. Celiac: Normally patent. Normally patent branch vessels and branching anatomy. SMA: Normal patent. Renals: Normally patent bilateral single renal arteries. IMA: Patent IMA. RIGHT Lower Extremity Inflow: Atherosclerosis of common iliac artery without aneurysm or significant stenosis. Scattered atherosclerosis of internal and external iliac arteries without aneurysm or significant stenosis. Outflow: Atherosclerosis of common femoral artery without significant stenosis. Normally patent profunda femoral artery. Scattered calcified plaque throughout the superficial femoral artery without high-grade stenosis or obstruction. Maximal stenosis approaches 50% at the level of the mid SFA. Scattered plaque throughout the popliteal artery with maximal stenosis of approximately 50% below the knee. Runoff: Continuously patent posterior tibial and peroneal runoff. The anterior tibial artery is small in caliber and occludes in the distal calf. LEFT Lower Extremity Inflow: Atherosclerosis of common iliac artery without aneurysm or significant stenosis. Scattered atherosclerosis of internal and external iliac arteries without aneurysm or significant stenosis. Outflow: Minimal atherosclerosis of the common profunda femoral artery. Femoral artery. Patent femoral bifurcation. Normally patent finding femoral artery. Scattered  calcified plaque throughout the SFA without significant stenosis. Atherosclerosis of the popliteal artery with maximal stenosis of approximately 60% at the level of the femoral condyles. Runoff: Continuously patent posterior tibial and peroneal arteries into the foot. The anterior tibial artery is small and poorly opacified distally but likely patent to at least the level of the ankle. Review of the MIP images confirms the above findings. NON-VASCULAR Lower chest: No acute abnormality. Hepatobiliary: Probable steatosis of the liver. Unremarkable gallbladder. Pancreas: Unremarkable. No pancreatic ductal dilatation or surrounding inflammatory changes. Spleen: No splenic injury or perisplenic hematoma. Adrenals/Urinary Tract: Adrenal glands are unremarkable. Kidneys are normal, without renal calculi, focal lesion, or  hydronephrosis. Bladder is unremarkable. Stomach/Bowel: Bowel shows no evidence of obstruction, ileus, inflammation or lesion. The appendix is not discretely visualized. No free intraperitoneal air. Lymphatic: No lymphadenopathy identified. Reproductive: Uterus and bilateral adnexa are unremarkable. Other: No abdominal wall hernia or abnormality. No abdominopelvic ascites. Musculoskeletal: No acute or significant osseous findings. IMPRESSION: 1. Atherosclerosis of the infrarenal abdominal aorta without aneurysm or stenosis. 2. Atherosclerosis of bilateral common iliac arteries without aneurysm or significant stenosis. 3. Atherosclerosis of bilateral superficial femoral arteries without high-grade stenosis or obstruction. Maximal stenosis approaches 50% at the level of the mid right SFA. 4. Atherosclerosis of bilateral popliteal arteries with maximal stenosis of approximately 50% on the right and 60% on the left. 5. Continuously patent posterior tibial and peroneal arteries bilaterally. The right anterior tibial arteries is small in caliber and occludes in the distal calf. The left anterior tibial artery is  patent to at least the ankle. 6. Probable steatosis of the liver. Electronically Signed   By: Marcey Moan M.D.   On: 01/01/2024 14:43   DG Foot Complete Left Result Date: 01/01/2024 CLINICAL DATA:  Third toe infection/wound. Two weeks oral antibiotics. EXAM: LEFT FOOT - COMPLETE 3+ VIEW COMPARISON:  12/20/2023 FINDINGS: External bandage overlying the distal third toe obscuring bony detail. No definite change in mild bone destruction over the third distal phalanx. Other stable postsurgical amputation changes involving the first, second and fifth toes. Mild degenerate change over the midfoot and hindfoot. No air within the soft tissues. IMPRESSION: 1. No definite change in mild bone destruction over the third distal phalanx. No air within the soft tissues. 2. Stable postsurgical amputation changes involving the first, second and fifth toes. Electronically Signed   By: Toribio Agreste M.D.   On: 01/01/2024 08:26   DG Foot Complete Left Result Date: 12/25/2023 CLINICAL DATA:  Wound third toe heel pain EXAM: LEFT FOOT - COMPLETE 3+ VIEW COMPARISON:  11/13/2023 FINDINGS: No fracture. Previous amputation of the first digit at the level of the IP joint, second digit at the level of head of proximal phalanx, and transmetatarsal amputation of the fifth digit. Cut margins at these digits appears grossly stable compared with radiograph from September. Osseous destructive change involving the distal phalanx of the third digit, suspicious for osteomyelitis. There is also probably erosive change of the middle phalanx at its base. IMPRESSION: 1. Osseous destructive change involving the distal phalanx of the third digit, suspicious for osteomyelitis. There is also probably erosive change/osteomyelitis of the third middle phalanx at its base. 2. Previous amputation of the first, second, and fifth digits with grossly stable appearance since September 2025. These results will be called to the ordering clinician or  representative by the Radiologist Assistant, and communication documented in the PACS or [employer]. Electronically Signed   By: Luke Bun M.D.   On: 12/25/2023 20:13   VAS US  ABI WITH/WO TBI Result Date: 12/21/2023  LOWER EXTREMITY DOPPLER STUDY Patient Name:  NYDIA YTUARTE  Date of Exam:   12/21/2023 Medical Rec #: [id number]             Accession #:    [id number] Date of Birth: 09-02-1965             Patient Gender: F Patient Age:   58 years Exam Location:  [address] Procedure:      VAS US  ABI WITH/WO TBI Referring Phys: [employer] --------------------------------------------------------------------------------  Indications: Peripheral artery disease. High Risk Factors: Hypertension, hyperlipidemia, coronary artery disease.  Vascular Interventions: 05/22/23-  LT & RT toe amputation                         08/08/23- RT toe amputation. Comparison Study: 05/18/23 Performing Technologist: Duwaine Hives RVS  Examination Guidelines: A complete evaluation includes at minimum, Doppler waveform signals and systolic blood pressure reading at the level of bilateral brachial, anterior tibial, and posterior tibial arteries, when vessel segments are accessible. Bilateral testing is considered an integral part of a complete examination. Photoelectric Plethysmograph (PPG) waveforms and toe systolic pressure readings are included as required and additional duplex testing as needed. Limited examinations for reoccurring indications may be performed as noted.  ABI Findings: +---------+------------------+-----+-----------+--------+ Right    Rt Pressure (mmHg)IndexWaveform   Comment  +---------+------------------+-----+-----------+--------+ Brachial 178                                        +---------+------------------+-----+-----------+--------+ PTA      176               0.94 multiphasic         +---------+------------------+-----+-----------+--------+ DP       164               0.88  multiphasic         +---------+------------------+-----+-----------+--------+ Great Toe70                0.37                     +---------+------------------+-----+-----------+--------+ +---------+------------------+-----+-----------+-------+ Left     Lt Pressure (mmHg)IndexWaveform   Comment +---------+------------------+-----+-----------+-------+ Brachial 187                                       +---------+------------------+-----+-----------+-------+ PTA      168               0.90 multiphasic        +---------+------------------+-----+-----------+-------+ DP       163               0.87 biphasic           +---------+------------------+-----+-----------+-------+ Great Toe74                0.40                    +---------+------------------+-----+-----------+-------+ +-------+-----------+-----------+------------+------------+ ABI/TBIToday's ABIToday's TBIPrevious ABIPrevious TBI +-------+-----------+-----------+------------+------------+ Right  0.94       0.37       0.86        0            +-------+-----------+-----------+------------+------------+ Left   0.90       0.40       0.92        0.87         +-------+-----------+-----------+------------+------------+  Summary: Right: Resting right ankle-brachial index indicates mild right lower extremity arterial disease. Right toe pressure is >60 mmHg which suggests adequate perfusion for healing. Left: Resting left ankle-brachial index indicates mild left lower extremity arterial disease. Left toe pressure is >60 mmHg which suggests adequate perfusion for healing. *See table(s) above for measurements and observations.  Electronically signed by Norman Serve on 12/21/2023 at 10:15:35 AM.    Final    Microbiology: Results for orders placed or performed during the hospital  encounter of 01/01/24  Blood Cultures x 2 sites     Status: None (Preliminary result)   Collection Time: 01/01/24 12:32 PM   Specimen:  BLOOD RIGHT FOREARM  Result Value Ref Range Status   Specimen Description BLOOD RIGHT FOREARM  Final   Special Requests   Final    BOTTLES DRAWN AEROBIC AND ANAEROBIC Blood Culture adequate volume   Culture   Final    NO GROWTH 3 DAYS Performed at [facility], [address] [place], [place] [address]    Report Status PENDING  Incomplete  Blood Cultures x 2 sites     Status: None (Preliminary result)   Collection Time: 01/01/24 12:37 PM   Specimen: BLOOD LEFT FOREARM  Result Value Ref Range Status   Specimen Description BLOOD LEFT FOREARM  Final   Special Requests   Final    BOTTLES DRAWN AEROBIC AND ANAEROBIC Blood Culture results may not be optimal due to an inadequate volume of blood received in culture bottles   Culture   Final    NO GROWTH 3 DAYS Performed at [facility], [address] [place], [place] [address]    Report Status PENDING  Incomplete  Surgical pcr screen     Status: None   Collection Time: 01/01/24  3:46 PM   Specimen: Nasal Mucosa; Nasal Swab  Result Value Ref Range Status   MRSA, PCR NEGATIVE NEGATIVE Final   Staphylococcus aureus NEGATIVE NEGATIVE Final    Comment: (NOTE) The Xpert SA Assay (FDA approved for NASAL specimens in patients 32 years of age and older), is one component of a comprehensive surveillance program. It is not intended to diagnose infection nor to guide or monitor treatment. Performed at [facility], [address], [place], [place] [address]    Labs: CBC: Recent Labs  Lab 01/01/24 0659 01/02/24 0436  WBC 6.5 7.0  NEUTROABS 3.1  --   HGB 11.5* 11.5*  HCT 37.8 36.8  MCV 83.1 81.8  PLT 262 242   Basic Metabolic Panel: Recent Labs  Lab 01/01/24 0659 01/01/24 1911 01/02/24 0436  NA 137  --  141  K 3.8  --  4.4  CL 101  --  103  CO2 24  --  25  GLUCOSE 92  --  57*  BUN 10  --  12  CREATININE 0.69  --  0.86  CALCIUM  9.0  --  9.4  MG  --  1.5* 1.6*   Liver Function Tests: Recent Labs  Lab  01/02/24 0436  AST 12*  ALT 10  ALKPHOS 93  BILITOT 0.5  PROT 6.7  ALBUMIN 3.0*   CBG: Recent Labs  Lab 01/02/24 0632 01/02/24 0704 01/02/24 0910 01/02/24 1210 01/02/24 1710  GLUCAP 66* 126* 84 172* 212*   Discharge time spent: greater than 30 minutes.  Signed: Alejandro Marker, DO [facility] 01/04/2024

## 2024-01-02 NOTE — Telephone Encounter (Signed)
 Pharmacy Patient Advocate Encounter  Insurance verification completed.    The patient is insured through [employer]. Patient has [employer], may use a copay card, and/or apply for patient assistance if available.    Ran test claim for Eliquis  5mg  and it is refill too soon until 01/19/24   This test claim was processed through [facility]- copay amounts may vary at other pharmacies due to pharmacy/plan contracts, or as the patient moves through the different stages of their insurance plan.

## 2024-01-02 NOTE — Evaluation (Signed)
 Occupational Therapy Evaluation and Discharge Patient Details Name: Carmen Armstrong MRN: [id number] DOB: 1965/02/20 Today's Date: 01/02/2024   History of Present Illness   Pt is a 58 year old woman admitted this date for L 3rd toe amputation. PMH: multiple partial toe amputations, DM, smoker, PVD, HTN, arthritis, hyperthyroidism, chronic back pain with spinal stimulator.     Clinical Impressions Pt typically walks with a cane due to chronic back pain and is independent in self care, sitting to shower. She drives and performs IADLs independently. Pt lives with her supportive niece. Pt presents with post operative pain. She is currently mobilizing and completing ADLs modified independently. No further OT needs. Pt is eager to return home today.      If plan is discharge home, recommend the following:   Assist for transportation     Functional Status Assessment   Patient has not had a recent decline in their functional status     Equipment Recommendations   None recommended by OT     Recommendations for Other Services         Precautions/Restrictions   Precautions Precautions: None Recall of Precautions/Restrictions: Intact Required Braces or Orthoses: Other Brace Other Brace: L post op shoe Restrictions Weight Bearing Restrictions Per Provider Order: Yes LLE Weight Bearing Per Provider Order: Weight bearing as tolerated     Mobility Bed Mobility Overal bed mobility: Modified Independent                  Transfers Overall transfer level: Modified independent Equipment used: Rolling walker (2 wheels), Straight cane               General transfer comment: tried RW, prefers cane vs no device      Balance Overall balance assessment: Needs assistance   Sitting balance-Leahy Scale: Normal     Standing balance support: Single extremity supported Standing balance-Leahy Scale: Fair                             ADL either  performed or assessed with clinical judgement   ADL Overall ADL's : Modified independent                                             Vision Ability to See in Adequate Light: 0 Adequate Patient Visual Report: No change from baseline       Perception         Praxis         Pertinent Vitals/Pain Pain Assessment Pain Assessment: 0-10 Pain Score: 6  Pain Location: L foot Pain Descriptors / Indicators: Aching Pain Intervention(s): Premedicated before session, Repositioned     Extremity/Trunk Assessment Upper Extremity Assessment Upper Extremity Assessment: Overall WFL for tasks assessed;Right hand dominant   Lower Extremity Assessment Lower Extremity Assessment: Defer to PT evaluation   Cervical / Trunk Assessment Cervical / Trunk Assessment: Normal   Communication Communication Communication: No apparent difficulties   Cognition Arousal: Alert Behavior During Therapy: WFL for tasks assessed/performed Cognition: No apparent impairments                               Following commands: Intact       Cueing  General Comments   Cueing Techniques: Verbal cues  Exercises     Shoulder Instructions      Home Living Family/patient expects to be discharged to:: Private residence Living Arrangements: Other relatives (niece) Available Help at Discharge: Family;Available 24 hours/day Type of Home: House Home Access: Stairs to enter [employer] of Steps: 4 Entrance Stairs-Rails: Right;Left Home Layout: One level     Bathroom Shower/Tub: Chief Strategy Officer: Standard     Home Equipment: Agricultural Consultant (2 wheels);BSC/3in1;Cane - single point;Adaptive equipment;Shower seat;Hand held shower head;Grab bars - Probation Officer: Reacher;Sock aid Additional Comments: lives with niece and dogs      Prior Functioning/Environment Prior Level of Function : Independent/Modified  Independent;Driving             Mobility Comments: walks with a cane mostly for her back      OT Problem List: Pain;Impaired balance (sitting and/or standing)   OT Treatment/Interventions:        OT Goals(Current goals can be found in the care plan section)       OT Frequency:       Co-evaluation              AM-PAC OT 6 Clicks Daily Activity     Outcome Measure Help from another person eating meals?: None Help from another person taking care of personal grooming?: None Help from another person toileting, which includes using toliet, bedpan, or urinal?: None Help from another person bathing (including washing, rinsing, drying)?: None Help from another person to put on and taking off regular upper body clothing?: None Help from another person to put on and taking off regular lower body clothing?: None 6 Click Score: 24   End of Session Equipment Utilized During Treatment: Gait belt;Other (comment);Rolling walker (2 wheels) (post op shoe, cane)  Activity Tolerance: Patient tolerated treatment well Patient left: in bed;with call bell/phone within reach  OT Visit Diagnosis: Other abnormalities of gait and mobility (R26.89);Pain                Time: 8479-8458 OT Time Calculation (min): 21 min Charges:  OT General Charges $OT Visit: 1 Visit OT Evaluation $OT Eval Low Complexity: 1 Low  Mliss HERO, OTR/L Acute Rehabilitation Services Office: [phone]   Kennth Mliss Helling 01/02/2024, 3:46 PM

## 2024-01-03 ENCOUNTER — Telehealth: Payer: Self-pay | Admitting: Lab

## 2024-01-03 ENCOUNTER — Encounter (HOSPITAL_COMMUNITY): Payer: Self-pay | Admitting: Podiatry

## 2024-01-03 LAB — SURGICAL PATHOLOGY

## 2024-01-03 NOTE — Telephone Encounter (Signed)
 Patient left message stating was returning your call.

## 2024-01-04 NOTE — Hospital Course (Addendum)
 58 y.o. female with history of PAD and DM2 and prior partial 1st and 2nd toe amputations of the left foot presenting with worsening gangrene of the left third toe.  She was seen yesterday by Dr. Joya in the office and sent in for evaluation for amputation of the toe.  I discussed with the patient the recommendation for partial or complete left third toe amputation and she is in agreement.  She has healed that her other amputation of the left foot well and appears to have adequate flow for healing at this time.  Discussed plan for amputation tomorrow.  She is in agreement.   Patient underwent amputation and was cleared for discharge by the podiatry team.  They recommended weightbearing as tolerated in a postoperative shoe until further instructed and keeping the dressing clean dry and intact.  They recommended switching antibiotics to p.o. doxycycline  for 5 days and expect clear margins.  She is medically stable for discharge.  Follow-up with PCP within 1 to 2 weeks and Podiatry within 1 week.  Assessment and Plan:  Diabetic third toe infection: Presented with left third MTP diabetic foot infection.  Podiatry consulted and took the patient for amputation.  They recommended 5 days of antibiotics and weightbearing as tolerated in a postoperative shoe.  Podiatry recommends keeping the dressings clean dry and intact and follow-up in outpatient setting within 1 to 2 weeks  PAF: C/w Metoprolol  Succinate per discussion with podiatry okay to resume anticoagulation  Diabetes mellitus type 2 insulin -dependent controlled: Glycemic protocol Premeal 3 times daily and as needed. Hold PTA meds which includes Tresiba / Xigduo  but resume @ D/C. CTM Blood Sugars per protocol. CBG Trend:  Recent Labs  Lab 01/01/24 2116 01/02/24 0125 01/02/24 0632 01/02/24 0704 01/02/24 0910 01/02/24 1210 01/02/24 1710  GLUCAP 98 80 66* 126* 84 172* 212*     Chronic HFpEF: Patient is currently euvolemic, strict I's and O's,  daily weights, as needed diuretic as deemed appropriate.. Currently will continue patient on metoprolol , and candesartan is on Imdur  at discharge   History of CAD/PAD/hyperlipidemia: Continue patient on atorvastatin . Hed Eliquis  for procedure.  Per podiatry okay to resume Continue metoprolol  100 mg daily. Patient has 1+ palpable pulses in both foot and with her recurrence of wound and poor healing will proceed with CTA aorta runoff and it showed Atherosclerosis of the infrarenal abdominal aorta without aneurysm or stenosis. 2. Atherosclerosis of bilateral common iliac arteries without aneurysm or significant stenosis. 3. Atherosclerosis of bilateral superficial femoral arteries without high-grade stenosis or obstruction. Maximal stenosis approaches 50% at the level of the mid right SFA. 4. Atherosclerosis of bilateral popliteal arteries with maximal stenosis of approximately 50% on the right and 60% on the left. 5. Continuously patent posterior tibial and peroneal arteries bilaterally. The right anterior tibial arteries is small in caliber and occludes in the distal calf. The left anterior tibial artery is patent to at least the ankle. 6. Probable steatosis of the liver. - Follow-up with vascular outpatient setting   Hyperthyroidism: Secondary to Graves' disease currently patient is on methimazole .   Chronic pain syndrome: Will continue patient on Belbuca  300 mcg twice daily and tramadol  as needed.   Tobacco Abuse: C/w Nicotine  patch.  Overweight: Complicates overall prognosis and care. Estimated body mass index is 28.65 kg/m as calculated from the following:   Height as of this encounter: 5' 9 (1.753 m).   Weight as of this encounter: 88 kg. Weight Loss and Dietary Counseling given

## 2024-01-07 LAB — CULTURE, BLOOD (ROUTINE X 2)
Culture: NO GROWTH
Culture: NO GROWTH
Special Requests: ADEQUATE

## 2024-01-09 ENCOUNTER — Ambulatory Visit: Admitting: Podiatry

## 2024-01-09 ENCOUNTER — Encounter: Payer: Self-pay | Admitting: Podiatry

## 2024-01-09 DIAGNOSIS — Z9889 Other specified postprocedural states: Secondary | ICD-10-CM

## 2024-01-09 NOTE — Progress Notes (Signed)
 Subjective:  Patient ID: Carmen Armstrong, female    DOB: [birth year],  MRN: [id number]  Chief Complaint  Patient presents with   Wound Check    POV#1- DOS 01/02/24- AMPUTATION, TOE (Left) - left third toe amputation as a surgical intervention. L 3rd toe has drainage and one open area. Some pain under 3rd met plantar. Has one more day of antibiotics. Diabetic A1c 6.3    DOS: 01/02/24 Procedure: Left third digit amputation  58 y.o. female returns for POV#1.  Doing well not having too much issue  Review of Systems: Negative except as noted in the HPI. Denies N/V/F/Ch.  Past Medical History:  Diagnosis Date   Arrhythmia    Arthritis    Atrial fibrillation (HCC)    CAD (coronary artery disease)    Chronic lower back pain    Depression    Diabetes mellitus without complication (HCC)    Eczema    GAD (generalized anxiety disorder)    GERD (gastroesophageal reflux disease)    H/O vitamin D deficiency    Heart failure (HCC)    Hyperlipidemia    Hypertension    Hypothyroidism    Insomnia    Peripheral neuropathy    PVD (peripheral vascular disease)     Current Outpatient Medications:    acetaminophen  (TYLENOL ) 325 MG tablet, Take 2 tablets (650 mg total) by mouth every 6 (six) hours as needed for mild pain (pain score 1-3), moderate pain (pain score 4-6), fever or headache (or Fever >/= 101)., Disp: 20 tablet, Rfl: 0   amLODipine  (NORVASC ) 10 MG tablet, TAKE 1 TABLET BY MOUTH DAILY *EMERGENCY REFILL*, Disp: 90 tablet, Rfl: 1   atorvastatin  (LIPITOR) 40 MG tablet, Take 40 mg by mouth at bedtime., Disp: , Rfl:    Buprenorphine  HCl 450 MCG FILM, Take 450 mcg by mouth 2 (two) times daily., Disp: , Rfl:    buPROPion  (WELLBUTRIN  XL) 300 MG 24 hr tablet, Take 300 mg by mouth every morning., Disp: , Rfl:    candesartan (ATACAND) 4 MG tablet, Take 4 mg by mouth daily., Disp: , Rfl:    colchicine 0.6 MG tablet, Take 0.6-1.2 mg by mouth See admin instructions. Take 1.2 mg for the  first initial dose then 0.6 mg daily., Disp: , Rfl:    Continuous Glucose Sensor (DEXCOM G7 SENSOR) MISC, Inject 1 Device into the skin once a week., Disp: , Rfl:    ELIQUIS  5 MG TABS tablet, Take 1 tablet (5 mg total) by mouth 2 (two) times daily., Disp: 180 tablet, Rfl: 3   famotidine (PEPCID) 40 MG tablet, Take 40 mg by mouth every morning., Disp: , Rfl:    FLUoxetine  (PROZAC ) 40 MG capsule, Take 40 mg by mouth every morning., Disp: , Rfl:    LINZESS  145 MCG CAPS capsule, Take 145 mcg by mouth daily., Disp: , Rfl:    methimazole  (TAPAZOLE ) 5 MG tablet, Take 1 tablet (5 mg total) by mouth as directed. 1 tablet Monday through Saturday, none on Sundays, Disp: 78 tablet, Rfl: 3   metoprolol  succinate (TOPROL -XL) 100 MG 24 hr tablet, Take 100 mg by mouth daily., Disp: , Rfl:    naloxone  (NARCAN ) nasal spray 4 mg/0.1 mL, Place 1 spray into the nose as needed (OD)., Disp: , Rfl:    nicotine  (NICODERM CQ  - DOSED IN MG/24 HOURS) 21 mg/24hr patch, Place 1 patch (21 mg total) onto the skin daily., Disp: 28 patch, Rfl: 0   omeprazole (PRILOSEC) 20 MG capsule, Take 20 mg  by mouth daily., Disp: , Rfl:    pregabalin  (LYRICA ) 100 MG capsule, Take 1 capsule (100 mg total) by mouth 3 (three) times daily., Disp: 90 capsule, Rfl: 2   spironolactone  (ALDACTONE ) 25 MG tablet, Take 25 mg by mouth daily., Disp: , Rfl:    tirzepatide  (MOUNJARO ) 12.5 MG/0.5ML Pen, Inject 12.5 mg into the skin once a week., Disp: 6 mL, Rfl: 3   traMADol  (ULTRAM ) 50 MG tablet, Take 50 mg by mouth every 8 (eight) hours as needed for moderate pain (pain score 4-6) or severe pain (pain score 7-10)., Disp: , Rfl:    TRESIBA  FLEXTOUCH 200 UNIT/ML FlexTouch Pen, INJECT 54 UNITS SUBCUTANEOUSLY DAILY. (Patient taking differently: Inject 56 Units into the skin daily at 12 noon.), Disp: 27 mL, Rfl: 3   XIGDUO  XR 11-998 MG TB24, Take 1 tablet by mouth in the morning and at bedtime., Disp: , Rfl:    zolpidem  (AMBIEN  CR) 6.25 MG CR tablet, Take 6.25 mg  by mouth at bedtime., Disp: , Rfl:   Social History   Tobacco Use  Smoking Status Every Day   Current packs/day: 0.50   Average packs/day: 0.5 packs/day for 30.0 years (15.0 ttl pk-yrs)   Types: Cigarettes  Smokeless Tobacco Never    Allergies  Allergen Reactions   Hazelnut (Filbert) Anaphylaxis   Penicillin G Other (See Comments)    Whelps - childhood allergy   Trulicity [Dulaglutide] Hives    Large itchy bumps   Objective:  There were no vitals filed for this visit. There is no height or weight on file to calculate BMI. Constitutional Well developed. Well nourished.  Vascular Foot warm and well perfused. Capillary refill normal to all digits.   Neurologic Normal speech. Oriented to person, place, and time. Epicritic sensation to light touch grossly present bilaterally.  Dermatologic Skin healing well without signs of infection. Skin edges well coapted without signs of infection.  Orthopedic: Tenderness to palpation noted about the surgical site.   Assessment:   1. Status post surgery    Plan:  Patient was evaluated and treated and all questions answered.  S/p foot surgery left -Progressing as expected post-operatively. -WB Status: Weightbearing as tolerated in surgical shoe -Sutures: Intact no major dehiscence noted.. -Medications: N/A -Foot redressed.  Return in 2 weeks for suture removal  No follow-ups on file.

## 2024-01-14 ENCOUNTER — Encounter: Admitting: Podiatry

## 2024-01-16 NOTE — Progress Notes (Deleted)
 Patient ID: Carmen Armstrong, female   DOB: [birth year], 59 y.o.   MRN: [id number]  Reason for Consult: No chief complaint on file.   Referred by Waylan Almarie SAUNDERS, MD  Subjective:     HPI Carmen Armstrong is a 58 y.o. female presenting for evaluation of a worsening left third toe amputation site. ***  Past Medical History:  Diagnosis Date   Arrhythmia    Arthritis    Atrial fibrillation (HCC)    CAD (coronary artery disease)    Chronic lower back pain    Depression    Diabetes mellitus without complication (HCC)    Eczema    GAD (generalized anxiety disorder)    GERD (gastroesophageal reflux disease)    H/O vitamin D deficiency    Heart failure (HCC)    Hyperlipidemia    Hypertension    Hypothyroidism    Insomnia    Peripheral neuropathy    PVD (peripheral vascular disease)    Family History  Problem Relation Age of Onset   Hypertension Mother    Hyperlipidemia Mother    Diabetes Mother    Stroke Mother    Clotting disorder Father    Hypertension Father    Hyperlipidemia Father    Hyperlipidemia Sister    Diabetes Brother    Neuropathy Brother    Past Surgical History:  Procedure Laterality Date   ABDOMINAL AORTOGRAM W/LOWER EXTREMITY Bilateral 05/21/2023   Procedure: ABDOMINAL AORTOGRAM W/LOWER EXTREMITY;  Surgeon: Pearline Norman RAMAN, MD;  Location: [facility];  Service: Cardiovascular;  Laterality: Bilateral;   AMPUTATION Right 05/22/2023   Procedure: AMPUTATION, FOOT, RAY;  Surgeon: Malvin Marsa FALCON, DPM;  Location: [facility] OR;  Service: Orthopedics/Podiatry;  Laterality: Right;  Left second toe partial amp   AMPUTATION TOE Left 05/22/2023   Procedure: AMPUTATION, TOE;  Surgeon: Malvin Marsa FALCON, DPM;  Location: [facility] OR;  Service: Orthopedics/Podiatry;  Laterality: Left;  Right great toe partial amputation   AMPUTATION TOE Right 08/08/2023   Procedure: AMPUTATION, TOE;  Surgeon: Malvin Marsa FALCON, DPM;  Location: [facility]  OR;  Service: Orthopedics/Podiatry;  Laterality: Right;  Right 2nd toe, possible revision right great toe amputation   AMPUTATION TOE Left 01/02/2024   Procedure: AMPUTATION, TOE;  Surgeon: Malvin Marsa FALCON, DPM;  Location: [facility] OR;  Service: Orthopedics/Podiatry;  Laterality: Left;  left third toe amputation   ANGIOPLASTY     legs   BACK SURGERY     5 different times   BREAST BIOPSY Right 07/24/2022   MM RT BREAST BX W LOC DEV 1ST LESION IMAGE BX SPEC STEREO GUIDE 07/24/2022 [facility]   CARDIAC CATHETERIZATION  2020   REPLACEMENT TOTAL HIP W/  RESURFACING IMPLANTS Right 2018   RIGHT AND LEFT HEART CATH     ROTATOR CUFF REPAIR Right 2016   SPINAL CORD STIMULATOR INSERTION N/A 05/08/2022   Procedure: PLACEMENT OF SPINAL CORD STIMULATOR;  Surgeon: Burnetta Aures, MD;  Location: [facility] OR;  Service: Orthopedics;  Laterality: N/A;   SPINE SURGERY  2014,2016,2017,2019   toe amputated  09/2018   5th left toe   TOE AMPUTATION Left    great toe on left side    Short Social History:  Social History   Tobacco Use   Smoking status: Every Day    Current packs/day: 0.50    Average packs/day: 0.5 packs/day for 30.0 years (15.0 ttl pk-yrs)    Types: Cigarettes   Smokeless tobacco: Never  Substance Use Topics   Alcohol use:  Not Currently    Allergies  Allergen Reactions   Hazelnut (Filbert) Anaphylaxis   Penicillin G Other (See Comments)    Whelps - childhood allergy   Trulicity [Dulaglutide] Hives    Large itchy bumps    Current Outpatient Medications  Medication Sig Dispense Refill   acetaminophen  (TYLENOL ) 325 MG tablet Take 2 tablets (650 mg total) by mouth every 6 (six) hours as needed for mild pain (pain score 1-3), moderate pain (pain score 4-6), fever or headache (or Fever >/= 101). 20 tablet 0   amLODipine  (NORVASC ) 10 MG tablet TAKE 1 TABLET BY MOUTH DAILY *EMERGENCY REFILL* 90 tablet 1   atorvastatin  (LIPITOR) 40 MG tablet Take 40 mg by mouth at bedtime.      Buprenorphine  HCl 450 MCG FILM Take 450 mcg by mouth 2 (two) times daily.     buPROPion  (WELLBUTRIN  XL) 300 MG 24 hr tablet Take 300 mg by mouth every morning.     candesartan (ATACAND) 4 MG tablet Take 4 mg by mouth daily.     colchicine 0.6 MG tablet Take 0.6-1.2 mg by mouth See admin instructions. Take 1.2 mg for the first initial dose then 0.6 mg daily.     Continuous Glucose Sensor (DEXCOM G7 SENSOR) MISC Inject 1 Device into the skin once a week.     ELIQUIS  5 MG TABS tablet Take 1 tablet (5 mg total) by mouth 2 (two) times daily. 180 tablet 3   famotidine (PEPCID) 40 MG tablet Take 40 mg by mouth every morning.     FLUoxetine  (PROZAC ) 40 MG capsule Take 40 mg by mouth every morning.     LINZESS  145 MCG CAPS capsule Take 145 mcg by mouth daily.     methimazole  (TAPAZOLE ) 5 MG tablet Take 1 tablet (5 mg total) by mouth as directed. 1 tablet Monday through Saturday, none on Sundays 78 tablet 3   metoprolol  succinate (TOPROL -XL) 100 MG 24 hr tablet Take 100 mg by mouth daily.     naloxone  (NARCAN ) nasal spray 4 mg/0.1 mL Place 1 spray into the nose as needed (OD).     nicotine  (NICODERM CQ  - DOSED IN MG/24 HOURS) 21 mg/24hr patch Place 1 patch (21 mg total) onto the skin daily. 28 patch 0   omeprazole (PRILOSEC) 20 MG capsule Take 20 mg by mouth daily.     pregabalin  (LYRICA ) 100 MG capsule Take 1 capsule (100 mg total) by mouth 3 (three) times daily. 90 capsule 2   spironolactone  (ALDACTONE ) 25 MG tablet Take 25 mg by mouth daily.     tirzepatide  (MOUNJARO ) 12.5 MG/0.5ML Pen Inject 12.5 mg into the skin once a week. 6 mL 3   traMADol  (ULTRAM ) 50 MG tablet Take 50 mg by mouth every 8 (eight) hours as needed for moderate pain (pain score 4-6) or severe pain (pain score 7-10).     TRESIBA  FLEXTOUCH 200 UNIT/ML FlexTouch Pen INJECT 54 UNITS SUBCUTANEOUSLY DAILY. (Patient taking differently: Inject 56 Units into the skin daily at 12 noon.) 27 mL 3   XIGDUO  XR 11-998 MG TB24 Take 1 tablet by mouth  in the morning and at bedtime.     zolpidem  (AMBIEN  CR) 6.25 MG CR tablet Take 6.25 mg by mouth at bedtime.     No current facility-administered medications for this visit.    REVIEW OF SYSTEMS  All other systems were reviewed and are negative     Objective:  Objective   There were no vitals filed for this visit.  There is no height or weight on file to calculate BMI.  Physical Exam General: no acute distress Cardiac: hemodynamically stable Extremities: *** Vascular:   Right: ***  Left: ***  Data: +---------+------------------+-----+-----------+--------+  Right   Rt Pressure (mmHg)IndexWaveform   Comment   +---------+------------------+-----+-----------+--------+  Brachial 178                                         +---------+------------------+-----+-----------+--------+  PTA     176               0.94 multiphasic          +---------+------------------+-----+-----------+--------+  DP      164               0.88 multiphasic          +---------+------------------+-----+-----------+--------+  Great Toe70                0.37                      +---------+------------------+-----+-----------+--------+   +---------+------------------+-----+-----------+-------+  Left    Lt Pressure (mmHg)IndexWaveform   Comment  +---------+------------------+-----+-----------+-------+  Brachial 187                                        +---------+------------------+-----+-----------+-------+  PTA     168               0.90 multiphasic         +---------+------------------+-----+-----------+-------+  DP      163               0.87 biphasic            +---------+------------------+-----+-----------+-------+  Great Toe74                0.40                     +---------+------------------+-----+-----------+-------+   Angiogram from April 2025 reviewed. On the left there was no flow-limiting stenosis of the three-vessel  runoff. On the right there was no flow-limiting stenosis with two-vessel runoff and a chronically occluded AT     Assessment/Plan:   Carmen Armstrong is a 58 y.o. female with diabetes and a worsening left third toe amputation site. ***    Norman GORMAN Serve MD [facility]

## 2024-01-17 LAB — LAB REPORT - SCANNED
A1c: 5.5
EGFR: 89
TSH: 1.65 (ref 0.41–5.90)

## 2024-01-18 ENCOUNTER — Ambulatory Visit: Admitting: Vascular Surgery

## 2024-01-22 ENCOUNTER — Ambulatory Visit: Admitting: Podiatry

## 2024-01-22 ENCOUNTER — Encounter: Payer: Self-pay | Admitting: Podiatry

## 2024-01-22 DIAGNOSIS — Z899 Acquired absence of limb, unspecified: Secondary | ICD-10-CM

## 2024-01-22 DIAGNOSIS — Z9889 Other specified postprocedural states: Secondary | ICD-10-CM

## 2024-01-22 DIAGNOSIS — E1142 Type 2 diabetes mellitus with diabetic polyneuropathy: Secondary | ICD-10-CM

## 2024-01-22 NOTE — Progress Notes (Signed)
 Subjective:  Patient ID: Carmen Armstrong, female    DOB: 1965-02-21,  MRN: [id number]  Chief Complaint  Patient presents with   Routine Post Op    Left foot sutures healed around incision. Pt states she is having some pain in ball of foot, but feels like nerve pain.   Diabetic A1c 6.7, 07/31/23.    DOS: 01/02/24 Procedure: Left third digit amputation  58 y.o. female returns for POV#2.  Doing well not having too much issue  Review of Systems: Negative except as noted in the HPI. Denies N/V/F/Ch.  Past Medical History:  Diagnosis Date   Arrhythmia    Arthritis    Atrial fibrillation (HCC)    CAD (coronary artery disease)    Chronic lower back pain    Depression    Diabetes mellitus without complication (HCC)    Eczema    GAD (generalized anxiety disorder)    GERD (gastroesophageal reflux disease)    H/O vitamin D deficiency    Heart failure (HCC)    Hyperlipidemia    Hypertension    Hypothyroidism    Insomnia    Peripheral neuropathy    PVD (peripheral vascular disease)     Current Outpatient Medications:    acetaminophen  (TYLENOL ) 325 MG tablet, Take 2 tablets (650 mg total) by mouth every 6 (six) hours as needed for mild pain (pain score 1-3), moderate pain (pain score 4-6), fever or headache (or Fever >/= 101)., Disp: 20 tablet, Rfl: 0   amLODipine  (NORVASC ) 10 MG tablet, TAKE 1 TABLET BY MOUTH DAILY *EMERGENCY REFILL*, Disp: 90 tablet, Rfl: 1   atorvastatin  (LIPITOR) 40 MG tablet, Take 40 mg by mouth at bedtime., Disp: , Rfl:    Buprenorphine  HCl 450 MCG FILM, Take 450 mcg by mouth 2 (two) times daily., Disp: , Rfl:    buPROPion  (WELLBUTRIN  XL) 300 MG 24 hr tablet, Take 300 mg by mouth every morning., Disp: , Rfl:    candesartan (ATACAND) 4 MG tablet, Take 4 mg by mouth daily., Disp: , Rfl:    colchicine 0.6 MG tablet, Take 0.6-1.2 mg by mouth See admin instructions. Take 1.2 mg for the first initial dose then 0.6 mg daily., Disp: , Rfl:    Continuous  Glucose Sensor (DEXCOM G7 SENSOR) MISC, Inject 1 Device into the skin once a week., Disp: , Rfl:    ELIQUIS  5 MG TABS tablet, Take 1 tablet (5 mg total) by mouth 2 (two) times daily., Disp: 180 tablet, Rfl: 3   famotidine (PEPCID) 40 MG tablet, Take 40 mg by mouth every morning., Disp: , Rfl:    FLUoxetine  (PROZAC ) 40 MG capsule, Take 40 mg by mouth every morning., Disp: , Rfl:    LINZESS  145 MCG CAPS capsule, Take 145 mcg by mouth daily., Disp: , Rfl:    methimazole  (TAPAZOLE ) 5 MG tablet, Take 1 tablet (5 mg total) by mouth as directed. 1 tablet Monday through Saturday, none on Sundays, Disp: 78 tablet, Rfl: 3   metoprolol  succinate (TOPROL -XL) 100 MG 24 hr tablet, Take 100 mg by mouth daily., Disp: , Rfl:    naloxone  (NARCAN ) nasal spray 4 mg/0.1 mL, Place 1 spray into the nose as needed (OD)., Disp: , Rfl:    nicotine  (NICODERM CQ  - DOSED IN MG/24 HOURS) 21 mg/24hr patch, Place 1 patch (21 mg total) onto the skin daily., Disp: 28 patch, Rfl: 0   omeprazole (PRILOSEC) 20 MG capsule, Take 20 mg by mouth daily., Disp: , Rfl:    pregabalin  (  LYRICA ) 100 MG capsule, Take 1 capsule (100 mg total) by mouth 3 (three) times daily., Disp: 90 capsule, Rfl: 2   spironolactone  (ALDACTONE ) 25 MG tablet, Take 25 mg by mouth daily., Disp: , Rfl:    tirzepatide  (MOUNJARO ) 12.5 MG/0.5ML Pen, Inject 12.5 mg into the skin once a week., Disp: 6 mL, Rfl: 3   traMADol  (ULTRAM ) 50 MG tablet, Take 50 mg by mouth every 8 (eight) hours as needed for moderate pain (pain score 4-6) or severe pain (pain score 7-10)., Disp: , Rfl:    TRESIBA  FLEXTOUCH 200 UNIT/ML FlexTouch Pen, INJECT 54 UNITS SUBCUTANEOUSLY DAILY. (Patient taking differently: Inject 56 Units into the skin daily at 12 noon.), Disp: 27 mL, Rfl: 3   XIGDUO  XR 11-998 MG TB24, Take 1 tablet by mouth in the morning and at bedtime., Disp: , Rfl:    zolpidem  (AMBIEN  CR) 6.25 MG CR tablet, Take 6.25 mg by mouth at bedtime., Disp: , Rfl:   Social History   Tobacco  Use  Smoking Status Every Day   Current packs/day: 0.50   Average packs/day: 0.5 packs/day for 30.0 years (15.0 ttl pk-yrs)   Types: Cigarettes  Smokeless Tobacco Never    Allergies  Allergen Reactions   Hazelnut (Filbert) Anaphylaxis   Penicillin G Other (See Comments)    Whelps - childhood allergy   Trulicity [Dulaglutide] Hives    Large itchy bumps   Objective:  There were no vitals filed for this visit. There is no height or weight on file to calculate BMI. Constitutional Well developed. Well nourished.  Vascular Foot warm and well perfused. Capillary refill normal to all digits.   Neurologic Normal speech. Oriented to person, place, and time. Epicritic sensation to light touch grossly present bilaterally.  Dermatologic Skin healing well without signs of infection. Skin edges well coapted without signs of infection.  Orthopedic: Tenderness to palpation noted about the surgical site.   Assessment:   1. Status post surgery   2. History of amputation   3. Diabetic polyneuropathy associated with type 2 diabetes mellitus (HCC)     Plan:  Patient was evaluated and treated and all questions answered.  S/p foot surgery left -Progressing as expected post-operatively. -WB Status: Weightbearing as tolerated in surgical shoe. May transition to [facility] in regular shoe.  -Sutures:Removed without incident.  -Medications: N/A -Foot redressed.  Return in 3 weeks for recheck   No follow-ups on file.

## 2024-02-11 ENCOUNTER — Ambulatory Visit: Payer: Self-pay | Admitting: Cardiology

## 2024-02-12 ENCOUNTER — Ambulatory Visit (INDEPENDENT_AMBULATORY_CARE_PROVIDER_SITE_OTHER): Admitting: Podiatry

## 2024-02-12 DIAGNOSIS — E1142 Type 2 diabetes mellitus with diabetic polyneuropathy: Secondary | ICD-10-CM

## 2024-02-12 DIAGNOSIS — Z899 Acquired absence of limb, unspecified: Secondary | ICD-10-CM

## 2024-02-12 NOTE — Progress Notes (Signed)
 "  Subjective:  Patient ID: Carmen Armstrong, female    DOB: 24-Nov-1965,  MRN: [id number]  Chief Complaint  Patient presents with   Routine Post Op    Rm23 Post op left foot / pt reports she is still having pain in foot and hurts to walk on it/ neuropathy takes lyrica  100mg  tid    DOS: 01/02/24 Procedure: Left third digit amputation  58 y.o. female returns for POV#3.  Still having pain she has been taking Lyrica  3 times a day and not helping as much as it was before.  Gabapentin  has not helped in the past.  Significant neuropathy type symptoms.  Review of Systems: Negative except as noted in the HPI. Denies N/V/F/Ch.  Past Medical History:  Diagnosis Date   Arrhythmia    Arthritis    Atrial fibrillation (HCC)    CAD (coronary artery disease)    Chronic lower back pain    Depression    Diabetes mellitus without complication (HCC)    Eczema    GAD (generalized anxiety disorder)    GERD (gastroesophageal reflux disease)    H/O vitamin D deficiency    Heart failure (HCC)    Hyperlipidemia    Hypertension    Hypothyroidism    Insomnia    Peripheral neuropathy    PVD (peripheral vascular disease)     Current Outpatient Medications:    acetaminophen  (TYLENOL ) 325 MG tablet, Take 2 tablets (650 mg total) by mouth every 6 (six) hours as needed for mild pain (pain score 1-3), moderate pain (pain score 4-6), fever or headache (or Fever >/= 101)., Disp: 20 tablet, Rfl: 0   amLODipine  (NORVASC ) 10 MG tablet, TAKE 1 TABLET BY MOUTH DAILY *EMERGENCY REFILL*, Disp: 90 tablet, Rfl: 1   atorvastatin  (LIPITOR) 40 MG tablet, Take 40 mg by mouth at bedtime., Disp: , Rfl:    Buprenorphine  HCl 450 MCG FILM, Take 450 mcg by mouth 2 (two) times daily., Disp: , Rfl:    buPROPion  (WELLBUTRIN  XL) 300 MG 24 hr tablet, Take 300 mg by mouth every morning., Disp: , Rfl:    candesartan (ATACAND) 4 MG tablet, Take 4 mg by mouth daily., Disp: , Rfl:    colchicine 0.6 MG tablet, Take 0.6-1.2 mg by  mouth See admin instructions. Take 1.2 mg for the first initial dose then 0.6 mg daily., Disp: , Rfl:    Continuous Glucose Sensor (DEXCOM G7 SENSOR) MISC, Inject 1 Device into the skin once a week., Disp: , Rfl:    ELIQUIS  5 MG TABS tablet, Take 1 tablet (5 mg total) by mouth 2 (two) times daily., Disp: 180 tablet, Rfl: 3   famotidine (PEPCID) 40 MG tablet, Take 40 mg by mouth every morning., Disp: , Rfl:    FLUoxetine  (PROZAC ) 40 MG capsule, Take 40 mg by mouth every morning., Disp: , Rfl:    LINZESS  145 MCG CAPS capsule, Take 145 mcg by mouth daily., Disp: , Rfl:    methimazole  (TAPAZOLE ) 5 MG tablet, Take 1 tablet (5 mg total) by mouth as directed. 1 tablet Monday through Saturday, none on Sundays, Disp: 78 tablet, Rfl: 3   metoprolol  succinate (TOPROL -XL) 100 MG 24 hr tablet, Take 100 mg by mouth daily., Disp: , Rfl:    naloxone  (NARCAN ) nasal spray 4 mg/0.1 mL, Place 1 spray into the nose as needed (OD)., Disp: , Rfl:    nicotine  (NICODERM CQ  - DOSED IN MG/24 HOURS) 21 mg/24hr patch, Place 1 patch (21 mg total) onto the  skin daily., Disp: 28 patch, Rfl: 0   omeprazole (PRILOSEC) 20 MG capsule, Take 20 mg by mouth daily., Disp: , Rfl:    pregabalin  (LYRICA ) 100 MG capsule, Take 1 capsule (100 mg total) by mouth 3 (three) times daily., Disp: 90 capsule, Rfl: 2   spironolactone  (ALDACTONE ) 25 MG tablet, Take 25 mg by mouth daily., Disp: , Rfl:    tirzepatide  (MOUNJARO ) 12.5 MG/0.5ML Pen, Inject 12.5 mg into the skin once a week., Disp: 6 mL, Rfl: 3   traMADol  (ULTRAM ) 50 MG tablet, Take 50 mg by mouth every 8 (eight) hours as needed for moderate pain (pain score 4-6) or severe pain (pain score 7-10)., Disp: , Rfl:    TRESIBA  FLEXTOUCH 200 UNIT/ML FlexTouch Pen, INJECT 54 UNITS SUBCUTANEOUSLY DAILY. (Patient taking differently: Inject 56 Units into the skin daily at 12 noon.), Disp: 27 mL, Rfl: 3   XIGDUO  XR 11-998 MG TB24, Take 1 tablet by mouth in the morning and at bedtime., Disp: , Rfl:     zolpidem  (AMBIEN  CR) 6.25 MG CR tablet, Take 6.25 mg by mouth at bedtime., Disp: , Rfl:   Social History   Tobacco Use  Smoking Status Every Day   Current packs/day: 0.50   Average packs/day: 0.5 packs/day for 30.0 years (15.0 ttl pk-yrs)   Types: Cigarettes  Smokeless Tobacco Never    Allergies  Allergen Reactions   Hazelnut (Filbert) Anaphylaxis   Penicillin G Other (See Comments)    Whelps - childhood allergy   Trulicity [Dulaglutide] Hives    Large itchy bumps   Objective:  There were no vitals filed for this visit. There is no height or weight on file to calculate BMI. Constitutional Well developed. Well nourished.  Vascular Foot warm and well perfused. Capillary refill normal to all digits.   Neurologic Normal speech. Oriented to person, place, and time. Epicritic sensation to light touch grossly present bilaterally.  Dermatologic Skin healing well without signs of infection. Skin edges well coapted without signs of infection.  Orthopedic: Tenderness to palpation noted about the surgical site.   Assessment:   1. History of amputation   2. Diabetic polyneuropathy associated with type 2 diabetes mellitus (HCC)      Plan:  Patient was evaluated and treated and all questions answered.  S/p foot surgery left -Progressing as expected post-operatively. -WB Status: WBAT in regular shoe.  -Medications: N/A -Foot redressed. - Patient still with significant neuropathy type pains.  She has been on Lyrica  and this is no longer helping as well.  She is hoping for another solution.  Will check to see if Qutenza is an option for her.  Return in 6 weeks for recheck   Return in about 6 weeks (around 03/25/2024) for post op.   "

## 2024-02-21 ENCOUNTER — Ambulatory Visit (INDEPENDENT_AMBULATORY_CARE_PROVIDER_SITE_OTHER)

## 2024-02-21 ENCOUNTER — Encounter: Payer: Self-pay | Admitting: Podiatry

## 2024-02-21 ENCOUNTER — Ambulatory Visit: Admitting: Podiatry

## 2024-02-21 VITALS — Ht 69.0 in | Wt 194.0 lb

## 2024-02-21 DIAGNOSIS — L089 Local infection of the skin and subcutaneous tissue, unspecified: Secondary | ICD-10-CM | POA: Diagnosis not present

## 2024-02-21 DIAGNOSIS — S90822A Blister (nonthermal), left foot, initial encounter: Secondary | ICD-10-CM | POA: Diagnosis not present

## 2024-02-21 DIAGNOSIS — L03116 Cellulitis of left lower limb: Secondary | ICD-10-CM

## 2024-02-21 DIAGNOSIS — Z89422 Acquired absence of other left toe(s): Secondary | ICD-10-CM

## 2024-02-21 MED ORDER — DOXYCYCLINE HYCLATE 100 MG PO TABS: 100.0000 mg | ORAL_TABLET | Freq: Two times a day (BID) | ORAL | 0 refills | Status: DC

## 2024-02-21 NOTE — Progress Notes (Unsigned)
 Incision site scabbed over.  Area around this is very painful as is the remaining fourth toe.  Some bony changes seen on x-ray.  Will try and get MRI versus CT scan.  Does have spinal cord stimulator, [employer] is the manufacture.

## 2024-02-25 ENCOUNTER — Encounter: Payer: Self-pay | Admitting: Podiatry

## 2024-02-26 ENCOUNTER — Ambulatory Visit

## 2024-02-26 ENCOUNTER — Ambulatory Visit: Admitting: Podiatry

## 2024-02-26 ENCOUNTER — Encounter: Payer: Self-pay | Admitting: Podiatry

## 2024-02-26 DIAGNOSIS — Z89422 Acquired absence of other left toe(s): Secondary | ICD-10-CM

## 2024-02-26 DIAGNOSIS — L03116 Cellulitis of left lower limb: Secondary | ICD-10-CM

## 2024-02-26 MED ORDER — DOXYCYCLINE HYCLATE 100 MG PO TABS: 100.0000 mg | ORAL_TABLET | Freq: Two times a day (BID) | ORAL | 0 refills | Status: AC

## 2024-02-26 NOTE — Progress Notes (Signed)
 "  Subjective:  Patient ID: Carmen Armstrong, female    DOB: 1965/12/14,  MRN: [id number]  Chief Complaint  Patient presents with   Post-op Problem    DOS: 01/02/24  Left third digit amputation It's been sore, tender and swollen.     DOS: 01/02/24 Procedure: Left third digit amputation  59 y.o. female returns postoperative visit following left third toe amputation.  She was seen by Dr. Lamount last week for concern of celluliits. She was put on antibiotics and advised to follow-up.   Review of Systems: Negative except as noted in the HPI. Denies N/V/F/Ch.  Past Medical History:  Diagnosis Date   Arrhythmia    Arthritis    Atrial fibrillation (HCC)    CAD (coronary artery disease)    Chronic lower back pain    Depression    Diabetes mellitus without complication (HCC)    Eczema    GAD (generalized anxiety disorder)    GERD (gastroesophageal reflux disease)    H/O vitamin D deficiency    Heart failure (HCC)    Hyperlipidemia    Hypertension    Hypothyroidism    Insomnia    Peripheral neuropathy    PVD (peripheral vascular disease)     Current Outpatient Medications:    acetaminophen  (TYLENOL ) 325 MG tablet, Take 2 tablets (650 mg total) by mouth every 6 (six) hours as needed for mild pain (pain score 1-3), moderate pain (pain score 4-6), fever or headache (or Fever >/= 101)., Disp: 20 tablet, Rfl: 0   amLODipine  (NORVASC ) 10 MG tablet, TAKE 1 TABLET BY MOUTH DAILY *EMERGENCY REFILL*, Disp: 90 tablet, Rfl: 1   atorvastatin  (LIPITOR) 40 MG tablet, Take 40 mg by mouth at bedtime., Disp: , Rfl:    Buprenorphine  HCl 450 MCG FILM, Take 450 mcg by mouth 2 (two) times daily., Disp: , Rfl:    buPROPion  (WELLBUTRIN  XL) 300 MG 24 hr tablet, Take 300 mg by mouth every morning., Disp: , Rfl:    candesartan (ATACAND) 4 MG tablet, Take 4 mg by mouth daily., Disp: , Rfl:    colchicine 0.6 MG tablet, Take 0.6-1.2 mg by mouth See admin instructions. Take 1.2 mg for the first  initial dose then 0.6 mg daily., Disp: , Rfl:    Continuous Glucose Sensor (DEXCOM G7 SENSOR) MISC, Inject 1 Device into the skin once a week., Disp: , Rfl:    ELIQUIS  5 MG TABS tablet, Take 1 tablet (5 mg total) by mouth 2 (two) times daily., Disp: 180 tablet, Rfl: 3   famotidine (PEPCID) 40 MG tablet, Take 40 mg by mouth every morning., Disp: , Rfl:    FLUoxetine  (PROZAC ) 40 MG capsule, Take 40 mg by mouth every morning., Disp: , Rfl:    LINZESS  145 MCG CAPS capsule, Take 145 mcg by mouth daily., Disp: , Rfl:    methimazole  (TAPAZOLE ) 5 MG tablet, Take 1 tablet (5 mg total) by mouth as directed. 1 tablet Monday through Saturday, none on Sundays, Disp: 78 tablet, Rfl: 3   metoprolol  succinate (TOPROL -XL) 100 MG 24 hr tablet, Take 100 mg by mouth daily., Disp: , Rfl:    naloxone  (NARCAN ) nasal spray 4 mg/0.1 mL, Place 1 spray into the nose as needed (OD)., Disp: , Rfl:    nicotine  (NICODERM CQ  - DOSED IN MG/24 HOURS) 21 mg/24hr patch, Place 1 patch (21 mg total) onto the skin daily., Disp: 28 patch, Rfl: 0   omeprazole (PRILOSEC) 20 MG capsule, Take 20 mg by mouth daily.,  Disp: , Rfl:    pregabalin  (LYRICA ) 100 MG capsule, Take 1 capsule (100 mg total) by mouth 3 (three) times daily., Disp: 90 capsule, Rfl: 2   spironolactone  (ALDACTONE ) 25 MG tablet, Take 25 mg by mouth daily., Disp: , Rfl:    tirzepatide  (MOUNJARO ) 12.5 MG/0.5ML Pen, Inject 12.5 mg into the skin once a week., Disp: 6 mL, Rfl: 3   traMADol  (ULTRAM ) 50 MG tablet, Take 50 mg by mouth every 8 (eight) hours as needed for moderate pain (pain score 4-6) or severe pain (pain score 7-10)., Disp: , Rfl:    TRESIBA  FLEXTOUCH 200 UNIT/ML FlexTouch Pen, INJECT 54 UNITS SUBCUTANEOUSLY DAILY. (Patient taking differently: Inject 56 Units into the skin daily at 12 noon.), Disp: 27 mL, Rfl: 3   XIGDUO  XR 11-998 MG TB24, Take 1 tablet by mouth in the morning and at bedtime., Disp: , Rfl:    zolpidem  (AMBIEN  CR) 6.25 MG CR tablet, Take 6.25 mg by  mouth at bedtime., Disp: , Rfl:    doxycycline  (VIBRA -TABS) 100 MG tablet, Take 1 tablet (100 mg total) by mouth 2 (two) times daily for 10 days., Disp: 20 tablet, Rfl: 0  Social History   Tobacco Use  Smoking Status Every Day   Current packs/day: 0.50   Average packs/day: 0.5 packs/day for 30.0 years (15.0 ttl pk-yrs)   Types: Cigarettes  Smokeless Tobacco Never    Allergies  Allergen Reactions   Hazelnut (Filbert) Anaphylaxis   Penicillin G Other (See Comments)    Whelps - childhood allergy   Trulicity [Dulaglutide] Hives    Large itchy bumps   Objective:  There were no vitals filed for this visit. There is no height or weight on file to calculate BMI. Constitutional Well developed. Well nourished.  Vascular Foot warm and well perfused. Capillary refill normal to all digits.  Difficult to palpate pulses due to edema  Neurologic Normal speech. Oriented to person, place, and time. Epicritic sensation to light touch grossly present bilaterally.  Dermatologic Third toe amputation site left foot scabbed over without obvious signs of opening or gapping of incision.  There is some macerated superficial appearing skin breakdown to the sulcus of the adjacent fourth toe medially.  Erythema noted but seems to be improving.   Orthopedic: Tenderness to palpation noted about the surgical site.  Tenderness to fourth toe left foot.  Hammertoe contracture noted.  Left foot radiographs: Concern for osteolysis and cortical erosion of distal left fourth toe not seen on prior imaging.  Could represent osteomyelitis. Assessment:   1. H/O amputation of lesser toe, left   2. Cellulitis of left foot      Plan:  Patient was evaluated and treated and all questions answered.  S/p foot surgery left third toe amputation - Cellulits improving.  - Prescribing 10 days of doxycycline  100 mg twice daily.  Monitor for signs of progression and notify office or go to ED if worsening - Concern for  possible osteomyelitis of left fourth toe.  We will try to order MRI.  Waiting on MRI gave patient number to call to get shceduled.  - Recommend possibly Betadine gauze to fourth toe sulcus as well as scabbed over third toe amputations on abundance of caution.Advised to discontinue use of peroxide.  - Complicated by diabetes, PAD, A-fib, CHF -Discussed if any worsening to head to the ED.  Return in 2 weeks for recheck.     No follow-ups on file.    "

## 2024-02-29 ENCOUNTER — Other Ambulatory Visit: Payer: Self-pay | Admitting: Internal Medicine

## 2024-02-29 DIAGNOSIS — Z794 Long term (current) use of insulin: Secondary | ICD-10-CM

## 2024-03-04 ENCOUNTER — Other Ambulatory Visit: Payer: Self-pay | Admitting: Podiatry

## 2024-03-04 ENCOUNTER — Telehealth: Payer: Self-pay | Admitting: Lab

## 2024-03-04 DIAGNOSIS — E08621 Diabetes mellitus due to underlying condition with foot ulcer: Secondary | ICD-10-CM

## 2024-03-04 NOTE — Telephone Encounter (Signed)
 Patient states need new MRI order to be done at hospital unable to have done at imaging center due to having metal in her body please advise.

## 2024-03-05 ENCOUNTER — Encounter: Admitting: Podiatry

## 2024-03-05 ENCOUNTER — Ambulatory Visit (HOSPITAL_COMMUNITY)
Admission: RE | Admit: 2024-03-05 | Discharge: 2024-03-05 | Disposition: A | Discharge status: Home / Self Care | Source: Ambulatory Visit | Attending: Podiatry | Admitting: Podiatry

## 2024-03-05 DIAGNOSIS — L97524 Non-pressure chronic ulcer of other part of left foot with necrosis of bone: Secondary | ICD-10-CM | POA: Diagnosis present

## 2024-03-05 DIAGNOSIS — E08621 Diabetes mellitus due to underlying condition with foot ulcer: Secondary | ICD-10-CM

## 2024-03-05 DIAGNOSIS — E11621 Type 2 diabetes mellitus with foot ulcer: Secondary | ICD-10-CM | POA: Diagnosis not present

## 2024-03-11 ENCOUNTER — Encounter: Payer: Self-pay | Admitting: Podiatry

## 2024-03-11 ENCOUNTER — Ambulatory Visit (INDEPENDENT_AMBULATORY_CARE_PROVIDER_SITE_OTHER): Admitting: Podiatry

## 2024-03-11 DIAGNOSIS — L97524 Non-pressure chronic ulcer of other part of left foot with necrosis of bone: Secondary | ICD-10-CM

## 2024-03-11 DIAGNOSIS — E08621 Diabetes mellitus due to underlying condition with foot ulcer: Secondary | ICD-10-CM | POA: Diagnosis not present

## 2024-03-11 MED ORDER — DOXYCYCLINE HYCLATE 100 MG PO TABS: 100.0000 mg | ORAL_TABLET | Freq: Two times a day (BID) | ORAL | 0 refills | Status: AC

## 2024-03-11 MED ORDER — GABAPENTIN 300 MG PO CAPS: 300.0000 mg | ORAL_CAPSULE | Freq: Three times a day (TID) | ORAL | 0 refills | Status: AC

## 2024-03-11 NOTE — Patient Instructions (Signed)
 Preparing for Surgery      Thank you for choosing [facility] for your surgical care. Our board-certified and board-qualified physicians bring advanced training and a deep commitment to the highest standards in foot and ankle surgery.   From your first consultation to your final steps in recovery, our team is here to ensure you feel informed, supported, and confident throughout the entire process.   Visit [email or web address] to access our Surgery Patient page, where youll find all of the information listed below, plus additional helpful resources.      The Time of Your Surgery   For hospital procedures, your surgery time will be provided at the time of scheduling. If youre scheduled at [facility], youll receive a call from the surgical center 24 hours before your procedure with your confirmed time.?Please refer to the surgery information provided to you during your surgical consultation to determine where your surgery will take place.  Recommended Devices for Easier Recovery  Devices such as a knee scooter, crutches, walker, or wheelchair may or may not be covered by your insurance. If your surgeon has recommended this after surgery and it is not covered by your insurance you are still responsible for obtaining and using the recommended equipment. If you have questions or concerns about what equipment you will need please contact the office.  To support a smoother recovery, weve gathered a list of helpful, recommended equipment. Visit [email or web address] to see the full list.       Taking Medications?   If you are taking daily heart and blood pressure medications, seizure, reflux, allergy, asthma, anxiety, pain, or diabetes medications, make sure you notify the surgery center/hospital before the day of surgery so they can tell you which medications you should take or avoid the day of surgery.    GLP-1 antagonists taken weekly  should be stopped for 7 days before surgery. GLP-1 antagonists taken daily be stopped on the day of surgery. These include:   Dulaglutide (Trulicity)   Exenatide extended release (Bydureon BCise)   Exenatide (Byetta)   Semaglutide (Ozempic; Birch.brandt)   Tirzepatide  (Mounjaro )   Liraglutide (Victoza, Saxenda)   Lixisenatide (Adlyxin)   Phentermine (Adipex-P, Lomaira)   Semaglutide (Rybelsus) is oral and should be held for 3 days.   Metformin  - should be held for 2 days prior to surgery.   SGLT2 inhibitors should be stopped for 3 days (no longer because of the risk of euglycemic diabetic ketoacidosis) and include:   Canagliflozin (Invokana)   Ertugliflozin (Steglatro)   Dapagliflozin  (Farxiga )   Empagliflozin (Jardiance)   Additional medications to stop:   If you take blood thinners (Warfarin, Eliquis , Xarelto), please consult your doctor about stopping before your procedure.   Aspirin : Consult your doctor about stopping 1 week before your procedure.   Anti-inflammatory medications (such as ibuprofen)        Pre-Operative Instructions   Plan to be at the hospital at least an hour and a half (1.5), or at the surgery center (1) hour before your scheduled time, unless otherwise directed by the surgical center/hospital staff. You must have a responsible adult accompany you, remain during the surgery, and drive you home. Make sure you have directions to the surgical center/hospital to ensure you arrive on time.       [facility] [place]             [address]    [address]  [place], [place] [address]    [place], [place] [address]                 [phone]      [facility]   [place] [facility] [place]         [address]                [address]          [place], [place] [address]                            [place], [place] [address]     [facility]         [address]          [place], [place] [address]      If you are having surgery at [facility] or [facility], you will need a copy of your medical history and physical form from your family physician within one month before the date of surgery. We will give you a form for your primary physician to complete.       We will make every effort to accommodate the date you request for surgery. However, there are times when surgery dates or times need to be moved. We will contact you as soon as possible if a schedule change is required.     No aspirin /ibuprofen for one week before surgery. If you are on aspirin , any non-steroidal anti-inflammatory medications (Mobic, Aleve, Ibuprofen) should not be taken seven (7) days before surgery.       No food or drink after midnight the night before surgery unless directed otherwise by the surgical center/hospital staff. If you are having a surgical procedure in our office, and not in the surgical center or hospital, this does not apply to you.     No alcoholic beverages 24 hours before surgery. No smoking 24 hours before or 24 hours after surgery.      Wear loose pants or shorts. They should be loose enough to fit over bandages, boots, and casts.     Do not wear slip-on shoes. Sneakers are preferred.      If you were given a boot during your surgery consultation appointment, be sure to bring it with you to the surgery center/hospital on the day of your surgery. Also, bring crutches, a knee scooter, or a walker if your physician prescribed it for you before your surgery date.      If you have not been contacted by the surgery center/hospital by the day before your surgery, call the surgery center/hospital to confirm the date and time of your surgery.     Leave time from work may vary depending on the type of surgery you have. Appropriate arrangements should be made before surgery with your employer.     Prescriptions will be electronically submitted  to your pharmacy the evening before your surgery or the day of your surgery. Take the medication as directed. Pain medications will not be refilled on weekends and must be approved by the doctor.      Remove nail polish on the operative foot and avoid getting a pedicure two weeks before surgery.      The night before surgery, wash the foot and leg that is being operated on with water and the antibacterial soap that was provided at your consultation appointment. The antibacterial soap is encased in the  brush. You will need to wet the brush to cleanse the area. Be sure to pay special attention to beneath the toenails and in between the toes. Wash for at least three (3) minutes. Rinse thoroughly with water and pat dry with a towel. Perform this wash unless told not to do so by your physician.    If you have any questions regarding the scheduling of your surgery, please contact our surgical department at [phone]   If you have any questions regarding any of these instructions, please do not hesitate to call our triage nurse at [phone]

## 2024-03-11 NOTE — Progress Notes (Signed)
 "  Subjective:  Patient ID: Carmen Armstrong, female    DOB: Sep 02, 1965,  MRN: [id number]  Chief Complaint  Patient presents with   Routine Post Op    DOS: 01/02/24  Left third digit amputation I thought I was going to end up at the hospital last night.  It was giving me a fit.  The swelling had gone down.  I had a MRI.  She's been having spasms in it.     59 y.o. female returns for follow-up of left fourth toe wound.  She relates it has been getting worse and been very painful.  She relates she almost went to the hospital because of the pain last night.  She has had her MRI.  She is worried that she is going to lose her foot.  Review of Systems: Negative except as noted in the HPI. Denies N/V/F/Ch.  Past Medical History:  Diagnosis Date   Arrhythmia    Arthritis    Atrial fibrillation (HCC)    CAD (coronary artery disease)    Chronic lower back pain    Depression    Diabetes mellitus without complication (HCC)    Eczema    GAD (generalized anxiety disorder)    GERD (gastroesophageal reflux disease)    H/O vitamin D deficiency    Heart failure (HCC)    Hyperlipidemia    Hypertension    Hypothyroidism    Insomnia    Peripheral neuropathy    PVD (peripheral vascular disease)     Current Outpatient Medications:    doxycycline  (VIBRA -TABS) 100 MG tablet, Take 1 tablet (100 mg total) by mouth 2 (two) times daily for 14 days., Disp: 28 tablet, Rfl: 0   gabapentin  (NEURONTIN ) 300 MG capsule, Take 1 capsule (300 mg total) by mouth 3 (three) times daily., Disp: 90 capsule, Rfl: 0   HYDROcodone -acetaminophen  (NORCO/VICODIN) 5-325 MG tablet, Take 1 tablet by mouth 2 (two) times daily as needed., Disp: , Rfl:    isosorbide  mononitrate (IMDUR ) 60 MG 24 hr tablet, Take 60 mg by mouth daily., Disp: , Rfl:    acetaminophen  (TYLENOL ) 325 MG tablet, Take 2 tablets (650 mg total) by mouth every 6 (six) hours as needed for mild pain (pain score 1-3), moderate pain (pain score 4-6),  fever or headache (or Fever >/= 101)., Disp: 20 tablet, Rfl: 0   amLODipine  (NORVASC ) 10 MG tablet, TAKE 1 TABLET BY MOUTH DAILY *EMERGENCY REFILL*, Disp: 90 tablet, Rfl: 1   atorvastatin  (LIPITOR) 40 MG tablet, Take 40 mg by mouth at bedtime., Disp: , Rfl:    Buprenorphine  HCl 450 MCG FILM, Take 450 mcg by mouth 2 (two) times daily., Disp: , Rfl:    buPROPion  (WELLBUTRIN  XL) 300 MG 24 hr tablet, Take 300 mg by mouth every morning., Disp: , Rfl:    candesartan (ATACAND) 4 MG tablet, Take 4 mg by mouth daily., Disp: , Rfl:    colchicine 0.6 MG tablet, Take 0.6-1.2 mg by mouth See admin instructions. Take 1.2 mg for the first initial dose then 0.6 mg daily., Disp: , Rfl:    Continuous Glucose Sensor (DEXCOM G7 SENSOR) MISC, Inject 1 Device into the skin once a week., Disp: , Rfl:    ELIQUIS  5 MG TABS tablet, Take 1 tablet (5 mg total) by mouth 2 (two) times daily., Disp: 180 tablet, Rfl: 3   EMBECTA PEN NEEDLE NANO 2 GEN 32G X 4 MM MISC, USE TO INJECT INSULIN  IN THE MORNING, NOON, IN THE EVENING, AND AT  BEDTIME, Disp: 100 each, Rfl: 10   famotidine (PEPCID) 40 MG tablet, Take 40 mg by mouth every morning., Disp: , Rfl:    FLUoxetine  (PROZAC ) 40 MG capsule, Take 40 mg by mouth every morning., Disp: , Rfl:    LINZESS  145 MCG CAPS capsule, Take 145 mcg by mouth daily., Disp: , Rfl:    methimazole  (TAPAZOLE ) 5 MG tablet, TAKE 1 TABLET BY MOUTH AS DIRECTED MONDAY THROUGH SATURDAY. NONE ON SUNDAY., Disp: 78 tablet, Rfl: 10   metoprolol  succinate (TOPROL -XL) 100 MG 24 hr tablet, Take 100 mg by mouth daily., Disp: , Rfl:    naloxone  (NARCAN ) nasal spray 4 mg/0.1 mL, Place 1 spray into the nose as needed (OD)., Disp: , Rfl:    nicotine  (NICODERM CQ  - DOSED IN MG/24 HOURS) 21 mg/24hr patch, Place 1 patch (21 mg total) onto the skin daily., Disp: 28 patch, Rfl: 0   omeprazole (PRILOSEC) 20 MG capsule, Take 20 mg by mouth daily., Disp: , Rfl:    spironolactone  (ALDACTONE ) 25 MG tablet, Take 25 mg by mouth  daily., Disp: , Rfl:    tirzepatide  (MOUNJARO ) 12.5 MG/0.5ML Pen, Inject 12.5 mg into the skin once a week., Disp: 6 mL, Rfl: 3   traMADol  (ULTRAM ) 50 MG tablet, Take 50 mg by mouth every 8 (eight) hours as needed for moderate pain (pain score 4-6) or severe pain (pain score 7-10)., Disp: , Rfl:    TRESIBA  FLEXTOUCH 200 UNIT/ML FlexTouch Pen, INJECT 54 UNITS SUBCUTANEOUSLY DAILY. (Patient taking differently: Inject 56 Units into the skin daily at 12 noon.), Disp: 27 mL, Rfl: 3   XIGDUO  XR 11-998 MG TB24, Take 1 tablet by mouth in the morning and at bedtime., Disp: , Rfl:    zolpidem  (AMBIEN  CR) 6.25 MG CR tablet, Take 6.25 mg by mouth at bedtime., Disp: , Rfl:   Social History   Tobacco Use  Smoking Status Every Day   Current packs/day: 0.25   Average packs/day: 0.5 packs/day for 30.0 years (15.0 ttl pk-yrs)   Types: Cigarettes   Start date: 03/10/2024  Smokeless Tobacco Never    Allergies  Allergen Reactions   Hazelnut (Filbert) Anaphylaxis   Penicillin G Other (See Comments)    Whelps - childhood allergy   Trulicity [Dulaglutide] Hives    Large itchy bumps   Objective:  There were no vitals filed for this visit. There is no height or weight on file to calculate BMI. Constitutional Well developed. Well nourished.  Vascular Foot warm and well perfused. Capillary refill normal to all digits.  Difficult to palpate pulses due to edema  Neurologic Normal speech. Oriented to person, place, and time. Epicritic sensation to light touch grossly present bilaterally.  Dermatologic Third toe amputation site left foot scabbed over without obvious signs of opening or gapping of incision.  Fourth digit with distal ulceration noted.  Scabbed over and dry.  Toe with erythema edema and darkened discoloration noted.  Orthopedic: Tenderness to palpation noted about the surgical site.  Tenderness to fourth toe left foot.  Hammertoe contracture noted.  Left foot radiographs: Concern for osteolysis  and cortical erosion of distal left fourth toe not seen on prior imaging.  Could represent osteomyelitis.  MRI left foot IMPRESSION: 1.  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 2. Bone marrow edema within the middle and proximal phalanges of the fourth toe as well, concerning for osteomyelitis. 3. Edema within the third and fourth toe metatarsal heads and distal shafts is favored to  be reactive in nature although osteomyelitis cannot be excluded. 4.  No organized fluid collection or abscess.  Assessment:   1. 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  S/p foot surgery left third toe amputation Left fourth toe osteomyelitis - Cellulits about the same as previous. -Continue Betadine dressing changes - Will continue doxycycline .  Refill for 14 days sent to pharmacy. Discussed pain management and will try and switch from Lyrica  to Penton and see if this improves things.  300 x 3 times a day sent to pharmacy. - Concern for possible osteomyelitis of left fourth toe.   MRI reviewed and shows osteomyelitis of the fourth toe distal phalanx -Discussed with patient given infection in the toe recommendation at this point is for left transmetatarsal amputation.  Discussed with her that this is the best option for reducing recurrent infections and ulcerations.  She is in agreement with this plan.  Discussed we can try and do this outpatient in the surgery center however if she has any worsening in the meantime she needs to head to the emergency room. - Complicated by diabetes, PAD, A-fib, CHF -Discussed if any worsening to head to the ED. advised if any fevers chills nausea or vomiting would have to do this inpatient. -Will plan for left transmetatarsal amputation -Informed surgical risk consent was reviewed and read  aloud to the patient.  I reviewed the films.  I have discussed my findings with the patient in great detail.  I have discussed all risks including but not limited to infection, stiffness, scarring, limp, disability, deformity, damage to blood vessels and nerves, numbness, poor healing, need for braces, arthritis, chronic pain, amputation, death.  All benefits and realistic expectations discussed in great detail.  I have made no promises as to the outcome.  I have provided realistic expectations.  I have offered the patient a 2nd opinion, which they have declined and assured me they preferred to proceed despite the risks. Plan for surgery February 3. Postop meds: Zofran , oxycodone  5-325 mg, doxycycline     No follow-ups on file.    "

## 2024-03-13 NOTE — Progress Notes (Signed)
 "  Patient ID: Carmen Armstrong, female   DOB: 06/21/1965, 59 y.o.   MRN: [id number]  Reason for Consult: New Patient (Initial Visit)   Referred by Waylan Almarie SAUNDERS, MD  Subjective:     HPI Carmen Armstrong is a 59 y.o. female presenting for evaluation of a left toe wound and foot pain. She has been following with podiatry and there is tentative plan for transmetatarsal amputation given the finding of osteomyelitis in the 3rd and 4th toe metatarsal heads.  She had an angiogram in 2025 which demonstrated inline flow to the feet bilaterally without flow-limiting stenosis.  Past Medical History:  Diagnosis Date   Arrhythmia    Arthritis    Atrial fibrillation (HCC)    CAD (coronary artery disease)    Chronic lower back pain    Depression    Diabetes mellitus without complication (HCC)    Eczema    GAD (generalized anxiety disorder)    GERD (gastroesophageal reflux disease)    H/O vitamin D deficiency    Heart failure (HCC)    Hyperlipidemia    Hypertension    Hypothyroidism    Insomnia    Peripheral neuropathy    PVD (peripheral vascular disease)    Family History  Problem Relation Age of Onset   Hypertension Mother    Hyperlipidemia Mother    Diabetes Mother    Stroke Mother    Clotting disorder Father    Hypertension Father    Hyperlipidemia Father    Hyperlipidemia Sister    Diabetes Brother    Neuropathy Brother    Past Surgical History:  Procedure Laterality Date   ABDOMINAL AORTOGRAM W/LOWER EXTREMITY Bilateral 05/21/2023   Procedure: ABDOMINAL AORTOGRAM W/LOWER EXTREMITY;  Surgeon: Pearline Norman RAMAN, MD;  Location: [facility];  Service: Cardiovascular;  Laterality: Bilateral;   AMPUTATION Right 05/22/2023   Procedure: AMPUTATION, FOOT, RAY;  Surgeon: Malvin Marsa FALCON, DPM;  Location: [facility];  Service: Orthopedics/Podiatry;  Laterality: Right;  Left second toe partial amp   AMPUTATION TOE Left 05/22/2023   Procedure:  AMPUTATION, TOE;  Surgeon: Malvin Marsa FALCON, DPM;  Location: [facility];  Service: Orthopedics/Podiatry;  Laterality: Left;  Right great toe partial amputation   AMPUTATION TOE Right 08/08/2023   Procedure: AMPUTATION, TOE;  Surgeon: Malvin Marsa FALCON, DPM;  Location: [facility];  Service: Orthopedics/Podiatry;  Laterality: Right;  Right 2nd toe, possible revision right great toe amputation   AMPUTATION TOE Left 01/02/2024   Procedure: AMPUTATION, TOE;  Surgeon: Malvin Marsa FALCON, DPM;  Location: [facility];  Service: Orthopedics/Podiatry;  Laterality: Left;  left third toe amputation   ANGIOPLASTY     legs   BACK SURGERY     5 different times   BREAST BIOPSY Right 07/24/2022   MM RT BREAST BX W LOC DEV 1ST LESION IMAGE BX SPEC STEREO GUIDE 07/24/2022 [facility]   CARDIAC CATHETERIZATION  2020   REPLACEMENT TOTAL HIP W/  RESURFACING IMPLANTS Right 2018   RIGHT AND LEFT HEART CATH     ROTATOR CUFF REPAIR Right 2016   SPINAL CORD STIMULATOR INSERTION N/A 05/08/2022   Procedure: PLACEMENT OF SPINAL CORD STIMULATOR;  Surgeon: Burnetta Aures, MD;  Location: [facility];  Service: Orthopedics;  Laterality: N/A;   SPINE SURGERY  2014,2016,2017,2019   toe amputated  09/2018   5th left toe   TOE AMPUTATION Left    great toe on left side    Short Social History:  Social History   Tobacco  Use   Smoking status: Every Day    Current packs/day: 0.25    Average packs/day: 0.5 packs/day for 30.0 years (15.0 ttl pk-yrs)    Types: Cigarettes    Start date: 03/10/2024   Smokeless tobacco: Never  Substance Use Topics   Alcohol use: Not Currently    Allergies[1]  Current Outpatient Medications  Medication Sig Dispense Refill   acetaminophen  (TYLENOL ) 325 MG tablet Take 2 tablets (650 mg total) by mouth every 6 (six) hours as needed for mild pain (pain score 1-3), moderate pain (pain score 4-6), fever or headache (or Fever >/= 101). 20 tablet 0   amLODipine  (NORVASC ) 10 MG tablet TAKE 1  TABLET BY MOUTH DAILY *EMERGENCY REFILL* 90 tablet 1   atorvastatin  (LIPITOR) 40 MG tablet Take 40 mg by mouth at bedtime.     Buprenorphine  HCl 450 MCG FILM Take 450 mcg by mouth 2 (two) times daily.     buPROPion  (WELLBUTRIN  XL) 300 MG 24 hr tablet Take 300 mg by mouth every morning.     candesartan (ATACAND) 4 MG tablet Take 4 mg by mouth daily.     colchicine 0.6 MG tablet Take 0.6-1.2 mg by mouth See admin instructions. Take 1.2 mg for the first initial dose then 0.6 mg daily.     Continuous Glucose Sensor (DEXCOM G7 SENSOR) MISC Inject 1 Device into the skin once a week.     doxycycline  (VIBRA -TABS) 100 MG tablet Take 1 tablet (100 mg total) by mouth 2 (two) times daily for 14 days. 28 tablet 0   ELIQUIS  5 MG TABS tablet Take 1 tablet (5 mg total) by mouth 2 (two) times daily. 180 tablet 3   EMBECTA PEN NEEDLE NANO 2 GEN 32G X 4 MM MISC USE TO INJECT INSULIN  IN THE MORNING, NOON, IN THE EVENING, AND AT BEDTIME 100 each 10   famotidine (PEPCID) 40 MG tablet Take 40 mg by mouth every morning.     FLUoxetine  (PROZAC ) 40 MG capsule Take 40 mg by mouth every morning.     gabapentin  (NEURONTIN ) 300 MG capsule Take 1 capsule (300 mg total) by mouth 3 (three) times daily. 90 capsule 0   HYDROcodone -acetaminophen  (NORCO/VICODIN) 5-325 MG tablet Take 1 tablet by mouth 2 (two) times daily as needed.     isosorbide  mononitrate (IMDUR ) 60 MG 24 hr tablet Take 60 mg by mouth daily.     LINZESS  145 MCG CAPS capsule Take 145 mcg by mouth daily.     methimazole  (TAPAZOLE ) 5 MG tablet TAKE 1 TABLET BY MOUTH AS DIRECTED MONDAY THROUGH SATURDAY. NONE ON SUNDAY. 78 tablet 10   metoprolol  succinate (TOPROL -XL) 100 MG 24 hr tablet Take 100 mg by mouth daily.     naloxone  (NARCAN ) nasal spray 4 mg/0.1 mL Place 1 spray into the nose as needed (OD).     nicotine  (NICODERM CQ  - DOSED IN MG/24 HOURS) 21 mg/24hr patch Place 1 patch (21 mg total) onto the skin daily. 28 patch 0   omeprazole (PRILOSEC) 20 MG capsule Take  20 mg by mouth daily.     spironolactone  (ALDACTONE ) 25 MG tablet Take 25 mg by mouth daily.     tirzepatide  (MOUNJARO ) 12.5 MG/0.5ML Pen Inject 12.5 mg into the skin once a week. 6 mL 3   traMADol  (ULTRAM ) 50 MG tablet Take 50 mg by mouth every 8 (eight) hours as needed for moderate pain (pain score 4-6) or severe pain (pain score 7-10).     TRESIBA  FLEXTOUCH 200  UNIT/ML FlexTouch Pen INJECT 54 UNITS SUBCUTANEOUSLY DAILY. (Patient taking differently: Inject 56 Units into the skin daily at 12 noon.) 27 mL 3   XIGDUO  XR 11-998 MG TB24 Take 1 tablet by mouth in the morning and at bedtime.     zolpidem  (AMBIEN  CR) 6.25 MG CR tablet Take 6.25 mg by mouth at bedtime.     No current facility-administered medications for this visit.    REVIEW OF SYSTEMS  All other systems were reviewed and are negative     Objective:  Objective   Vitals:   03/14/24 1309  BP: 130/76  Pulse: 75  Resp: (!) 22  Temp: 97.7 F (36.5 C)  TempSrc: Temporal  SpO2: 98%  Weight: 208 lb 6.4 oz (94.5 kg)  Height: 5' 9 (1.753 m)   Body mass index is 30.78 kg/m.  Physical Exam General: no acute distress Cardiac: hemodynamically stable Extremities: Left foot dressing clean and intact  Data: ABI +---------+------------------+-----+-----------+--------+  Right   Rt Pressure (mmHg)IndexWaveform   Comment   +---------+------------------+-----+-----------+--------+  Brachial 178                                         +---------+------------------+-----+-----------+--------+  PTA     176               0.94 multiphasic          +---------+------------------+-----+-----------+--------+  DP      164               0.88 multiphasic          +---------+------------------+-----+-----------+--------+  Great Toe70                0.37                      +---------+------------------+-----+-----------+--------+   +---------+------------------+-----+-----------+-------+  Left    Lt  Pressure (mmHg)IndexWaveform   Comment  +---------+------------------+-----+-----------+-------+  Brachial 187                                        +---------+------------------+-----+-----------+-------+  PTA     168               0.90 multiphasic         +---------+------------------+-----+-----------+-------+  DP      163               0.87 biphasic            +---------+------------------+-----+-----------+-------+  Great Toe74                0.40                     +---------+------------------+-----+-----------+-------+   CMP reviewed, creatinine 0.86  MRI reviewed Osteomyelitis of the left 3rd and 4th metatarsal heads     Assessment/Plan:   Carmen Armstrong is a 59 y.o. female with osteomyelitis of the left 3rd and 4th toes. I explained that based on her ABI from November as well as her angiogram from April 2025 she should have adequate perfusion for healing.  We also discussed wearing compression stocking after surgery in order to help with her left leg edema.  Will plan for follow-up in 6 months with repeat ABI and at that time if her left  foot is healed and ABIs greater than 0.9 we can likely stop surveillance.   Norman GORMAN Serve MD [facility]     [1]  Allergies Allergen Reactions   Hazelnut Gayla) Anaphylaxis   Penicillin G Other (See Comments)    Whelps - childhood allergy   Trulicity [Dulaglutide] Hives    Large itchy bumps   "

## 2024-03-14 ENCOUNTER — Ambulatory Visit
Admission: RE | Admit: 2024-03-14 | Discharge: 2024-03-14 | Disposition: A | Source: Ambulatory Visit | Attending: Cardiology

## 2024-03-14 ENCOUNTER — Ambulatory Visit: Admitting: Vascular Surgery

## 2024-03-14 ENCOUNTER — Encounter: Payer: Self-pay | Admitting: Vascular Surgery

## 2024-03-14 VITALS — BP 130/76 | HR 75 | Temp 97.7°F | Resp 22 | Ht 69.0 in | Wt 208.4 lb

## 2024-03-14 DIAGNOSIS — I6523 Occlusion and stenosis of bilateral carotid arteries: Secondary | ICD-10-CM | POA: Insufficient documentation

## 2024-03-14 DIAGNOSIS — I739 Peripheral vascular disease, unspecified: Secondary | ICD-10-CM

## 2024-03-16 ENCOUNTER — Ambulatory Visit: Payer: Self-pay | Admitting: Cardiology

## 2024-03-17 ENCOUNTER — Encounter (HOSPITAL_COMMUNITY)

## 2024-03-17 ENCOUNTER — Ambulatory Visit (HOSPITAL_COMMUNITY)

## 2024-03-18 ENCOUNTER — Other Ambulatory Visit: Payer: Self-pay | Admitting: *Deleted

## 2024-03-18 ENCOUNTER — Telehealth: Payer: Self-pay | Admitting: Podiatry

## 2024-03-18 DIAGNOSIS — I739 Peripheral vascular disease, unspecified: Secondary | ICD-10-CM

## 2024-03-18 NOTE — Telephone Encounter (Addendum)
 Called patient in regards to surgery. After reviewing medications patient is on Mounjaro  and took her last dose on 2/1. Patients surgery has been rescheduled for 03/25/2024. Also reaching out to patient PCP to see how they recommend her stopping use of the Eliquis . Patient pharmacy correct in chart.

## 2024-03-20 ENCOUNTER — Telehealth: Payer: Self-pay

## 2024-03-20 NOTE — Telephone Encounter (Signed)
 Patient called and left a message. Dr. Joya had discussed with her a prescription for alprazolam. She is calling to remind us  to send that in. Thanks

## 2024-03-26 ENCOUNTER — Encounter: Admitting: Podiatry

## 2024-04-02 ENCOUNTER — Encounter: Admitting: Podiatry

## 2024-04-16 ENCOUNTER — Encounter: Admitting: Podiatry

## 2024-09-26 ENCOUNTER — Ambulatory Visit

## 2024-09-26 ENCOUNTER — Ambulatory Visit (HOSPITAL_COMMUNITY)
